# Patient Record
Sex: Female | Born: 1950
Health system: Southern US, Community
[De-identification: ages and names within clinical notes are randomized; demographics above are authoritative.]

## PROBLEM LIST (undated history)

## (undated) DIAGNOSIS — I472 Ventricular tachycardia, unspecified: Secondary | ICD-10-CM

## (undated) DIAGNOSIS — I4821 Permanent atrial fibrillation: Secondary | ICD-10-CM

## (undated) DIAGNOSIS — I639 Cerebral infarction, unspecified: Secondary | ICD-10-CM

## (undated) DIAGNOSIS — I340 Nonrheumatic mitral (valve) insufficiency: Secondary | ICD-10-CM

## (undated) DIAGNOSIS — I509 Heart failure, unspecified: Secondary | ICD-10-CM

## (undated) DIAGNOSIS — M5136 Other intervertebral disc degeneration, lumbar region: Secondary | ICD-10-CM

## (undated) DIAGNOSIS — I5022 Chronic systolic (congestive) heart failure: Secondary | ICD-10-CM

## (undated) DIAGNOSIS — N183 Chronic kidney disease, stage 3 unspecified: Secondary | ICD-10-CM

## (undated) DIAGNOSIS — D219 Benign neoplasm of connective and other soft tissue, unspecified: Secondary | ICD-10-CM

## (undated) DIAGNOSIS — Z8669 Personal history of other diseases of the nervous system and sense organs: Secondary | ICD-10-CM

## (undated) DIAGNOSIS — M5412 Radiculopathy, cervical region: Secondary | ICD-10-CM

## (undated) DIAGNOSIS — M51369 Other intervertebral disc degeneration, lumbar region without mention of lumbar back pain or lower extremity pain: Secondary | ICD-10-CM

## (undated) DIAGNOSIS — IMO0002 Reserved for concepts with insufficient information to code with codable children: Secondary | ICD-10-CM

## (undated) DIAGNOSIS — R42 Dizziness and giddiness: Secondary | ICD-10-CM

## (undated) DIAGNOSIS — H9190 Unspecified hearing loss, unspecified ear: Secondary | ICD-10-CM

## (undated) DIAGNOSIS — D126 Benign neoplasm of colon, unspecified: Secondary | ICD-10-CM

## (undated) DIAGNOSIS — M5030 Other cervical disc degeneration, unspecified cervical region: Secondary | ICD-10-CM

## (undated) DIAGNOSIS — G35 Multiple sclerosis: Secondary | ICD-10-CM

## (undated) DIAGNOSIS — I428 Other cardiomyopathies: Secondary | ICD-10-CM

## (undated) DIAGNOSIS — K219 Gastro-esophageal reflux disease without esophagitis: Secondary | ICD-10-CM

## (undated) HISTORY — PX: HYSTEROSCOPY: SHX211

## (undated) HISTORY — DX: Cerebral infarction, unspecified: I63.9

## (undated) HISTORY — DX: Other cardiomyopathies: I42.8

## (undated) HISTORY — DX: Ventricular tachycardia, unspecified: I47.20

## (undated) HISTORY — DX: Other intervertebral disc degeneration, lumbar region: M51.36

## (undated) HISTORY — DX: Gastro-esophageal reflux disease without esophagitis: K21.9

## (undated) HISTORY — DX: Multiple sclerosis: G35

## (undated) HISTORY — PX: TUBAL LIGATION: SHX77

## (undated) HISTORY — DX: Ventricular tachycardia: I47.2

## (undated) HISTORY — PX: PACEMAKER INSERTION: SHX728

## (undated) HISTORY — DX: Permanent atrial fibrillation: I48.21

## (undated) HISTORY — DX: Unspecified hearing loss, unspecified ear: H91.90

## (undated) HISTORY — DX: Benign neoplasm of connective and other soft tissue, unspecified: D21.9

## (undated) HISTORY — DX: Nonrheumatic mitral (valve) insufficiency: I34.0

## (undated) HISTORY — DX: Benign neoplasm of colon, unspecified: D12.6

## (undated) HISTORY — DX: Chronic kidney disease, stage 3 unspecified: N18.30

## (undated) HISTORY — DX: Other intervertebral disc degeneration, lumbar region without mention of lumbar back pain or lower extremity pain: M51.369

## (undated) HISTORY — DX: Other cervical disc degeneration, unspecified cervical region: M50.30

## (undated) HISTORY — DX: Dizziness and giddiness: R42

## (undated) HISTORY — DX: Personal history of other diseases of the nervous system and sense organs: Z86.69

## (undated) HISTORY — DX: Reserved for concepts with insufficient information to code with codable children: IMO0002

## (undated) HISTORY — DX: Chronic systolic (congestive) heart failure: I50.22

## (undated) HISTORY — PX: DILATION AND CURETTAGE OF UTERUS: SHX78

## (undated) HISTORY — DX: Radiculopathy, cervical region: M54.12

## (undated) HISTORY — DX: Chronic kidney disease, stage 3 (moderate): N18.3

---

## 1982-08-15 DIAGNOSIS — G35 Multiple sclerosis: Secondary | ICD-10-CM

## 1982-08-15 HISTORY — DX: Multiple sclerosis: G35

## 2000-08-03 ENCOUNTER — Other Ambulatory Visit: Admission: RE | Admit: 2000-08-03 | Discharge: 2000-08-03 | Payer: Self-pay | Admitting: Obstetrics and Gynecology

## 2001-08-20 ENCOUNTER — Inpatient Hospital Stay (HOSPITAL_COMMUNITY): Admission: EM | Admit: 2001-08-20 | Discharge: 2001-08-22 | Payer: Self-pay | Admitting: Emergency Medicine

## 2001-08-22 ENCOUNTER — Encounter: Payer: Self-pay | Admitting: Cardiology

## 2001-08-27 ENCOUNTER — Encounter: Payer: Self-pay | Admitting: Cardiology

## 2001-08-27 ENCOUNTER — Inpatient Hospital Stay (HOSPITAL_COMMUNITY): Admission: AD | Admit: 2001-08-27 | Discharge: 2001-08-28 | Payer: Self-pay | Admitting: Cardiology

## 2001-08-28 ENCOUNTER — Encounter: Payer: Self-pay | Admitting: Cardiology

## 2003-07-16 ENCOUNTER — Other Ambulatory Visit: Admission: RE | Admit: 2003-07-16 | Discharge: 2003-07-16 | Payer: Self-pay | Admitting: Obstetrics and Gynecology

## 2003-08-13 ENCOUNTER — Inpatient Hospital Stay (HOSPITAL_COMMUNITY): Admission: EM | Admit: 2003-08-13 | Discharge: 2003-08-21 | Payer: Self-pay | Admitting: Emergency Medicine

## 2003-08-14 ENCOUNTER — Encounter (INDEPENDENT_AMBULATORY_CARE_PROVIDER_SITE_OTHER): Payer: Self-pay | Admitting: Interventional Cardiology

## 2003-08-26 ENCOUNTER — Encounter: Admission: RE | Admit: 2003-08-26 | Discharge: 2003-10-23 | Payer: Self-pay | Admitting: Neurology

## 2004-08-04 ENCOUNTER — Ambulatory Visit (HOSPITAL_COMMUNITY): Admission: RE | Admit: 2004-08-04 | Discharge: 2004-08-04 | Payer: Self-pay | Admitting: Gastroenterology

## 2004-08-04 ENCOUNTER — Encounter (INDEPENDENT_AMBULATORY_CARE_PROVIDER_SITE_OTHER): Payer: Self-pay | Admitting: *Deleted

## 2004-09-16 ENCOUNTER — Encounter: Admission: RE | Admit: 2004-09-16 | Discharge: 2004-09-16 | Payer: Self-pay | Admitting: Internal Medicine

## 2005-05-17 ENCOUNTER — Other Ambulatory Visit: Admission: RE | Admit: 2005-05-17 | Discharge: 2005-05-17 | Payer: Self-pay | Admitting: Obstetrics and Gynecology

## 2005-07-04 ENCOUNTER — Emergency Department (HOSPITAL_COMMUNITY): Admission: EM | Admit: 2005-07-04 | Discharge: 2005-07-05 | Payer: Self-pay | Admitting: *Deleted

## 2007-05-08 ENCOUNTER — Other Ambulatory Visit: Admission: RE | Admit: 2007-05-08 | Discharge: 2007-05-08 | Payer: Self-pay | Admitting: Obstetrics and Gynecology

## 2007-10-23 ENCOUNTER — Ambulatory Visit (HOSPITAL_COMMUNITY): Admission: RE | Admit: 2007-10-23 | Discharge: 2007-10-23 | Payer: Self-pay | Admitting: Cardiology

## 2007-10-25 ENCOUNTER — Ambulatory Visit (HOSPITAL_COMMUNITY): Admission: RE | Admit: 2007-10-25 | Discharge: 2007-10-25 | Payer: Self-pay | Admitting: Cardiology

## 2008-01-08 ENCOUNTER — Ambulatory Visit (HOSPITAL_COMMUNITY): Admission: RE | Admit: 2008-01-08 | Discharge: 2008-01-08 | Payer: Self-pay | Admitting: Cardiology

## 2008-06-18 ENCOUNTER — Inpatient Hospital Stay (HOSPITAL_COMMUNITY): Admission: RE | Admit: 2008-06-18 | Discharge: 2008-06-19 | Payer: Self-pay | Admitting: Cardiology

## 2008-11-11 ENCOUNTER — Ambulatory Visit: Payer: Self-pay | Admitting: Obstetrics and Gynecology

## 2008-11-11 ENCOUNTER — Encounter: Payer: Self-pay | Admitting: Obstetrics and Gynecology

## 2008-11-11 ENCOUNTER — Other Ambulatory Visit: Admission: RE | Admit: 2008-11-11 | Discharge: 2008-11-11 | Payer: Self-pay | Admitting: Obstetrics and Gynecology

## 2009-07-06 ENCOUNTER — Ambulatory Visit: Payer: Self-pay | Admitting: Internal Medicine

## 2009-07-07 ENCOUNTER — Encounter (INDEPENDENT_AMBULATORY_CARE_PROVIDER_SITE_OTHER): Payer: Self-pay | Admitting: Internal Medicine

## 2009-07-07 ENCOUNTER — Inpatient Hospital Stay (HOSPITAL_COMMUNITY): Admission: EM | Admit: 2009-07-07 | Discharge: 2009-07-08 | Payer: Self-pay | Admitting: Emergency Medicine

## 2009-07-07 ENCOUNTER — Ambulatory Visit: Payer: Self-pay | Admitting: Cardiology

## 2009-07-07 ENCOUNTER — Ambulatory Visit: Payer: Self-pay | Admitting: Vascular Surgery

## 2009-12-22 ENCOUNTER — Encounter: Payer: Self-pay | Admitting: Internal Medicine

## 2009-12-28 ENCOUNTER — Ambulatory Visit (HOSPITAL_COMMUNITY): Admission: RE | Admit: 2009-12-28 | Discharge: 2009-12-28 | Payer: Self-pay | Admitting: Cardiology

## 2009-12-30 ENCOUNTER — Encounter: Payer: Self-pay | Admitting: Internal Medicine

## 2010-01-12 DIAGNOSIS — G35 Multiple sclerosis: Secondary | ICD-10-CM | POA: Insufficient documentation

## 2010-01-12 DIAGNOSIS — I10 Essential (primary) hypertension: Secondary | ICD-10-CM | POA: Insufficient documentation

## 2010-01-12 DIAGNOSIS — I428 Other cardiomyopathies: Secondary | ICD-10-CM | POA: Insufficient documentation

## 2010-01-12 DIAGNOSIS — Z950 Presence of cardiac pacemaker: Secondary | ICD-10-CM | POA: Insufficient documentation

## 2010-01-12 DIAGNOSIS — I4891 Unspecified atrial fibrillation: Secondary | ICD-10-CM | POA: Insufficient documentation

## 2010-01-12 DIAGNOSIS — I4892 Unspecified atrial flutter: Secondary | ICD-10-CM | POA: Insufficient documentation

## 2010-01-12 DIAGNOSIS — I442 Atrioventricular block, complete: Secondary | ICD-10-CM | POA: Insufficient documentation

## 2010-01-13 ENCOUNTER — Ambulatory Visit: Payer: Self-pay | Admitting: Internal Medicine

## 2010-02-04 ENCOUNTER — Encounter: Payer: Self-pay | Admitting: Internal Medicine

## 2010-02-12 DIAGNOSIS — H9190 Unspecified hearing loss, unspecified ear: Secondary | ICD-10-CM

## 2010-02-12 DIAGNOSIS — R42 Dizziness and giddiness: Secondary | ICD-10-CM

## 2010-02-12 HISTORY — DX: Dizziness and giddiness: R42

## 2010-02-12 HISTORY — DX: Unspecified hearing loss, unspecified ear: H91.90

## 2010-02-18 ENCOUNTER — Ambulatory Visit: Payer: Self-pay | Admitting: Internal Medicine

## 2010-04-09 ENCOUNTER — Encounter: Admission: RE | Admit: 2010-04-09 | Discharge: 2010-04-09 | Payer: Self-pay | Admitting: Otolaryngology

## 2010-04-26 ENCOUNTER — Encounter (INDEPENDENT_AMBULATORY_CARE_PROVIDER_SITE_OTHER): Payer: Self-pay | Admitting: Cardiology

## 2010-04-26 ENCOUNTER — Ambulatory Visit (HOSPITAL_COMMUNITY): Admission: RE | Admit: 2010-04-26 | Discharge: 2010-04-26 | Payer: Self-pay | Admitting: Cardiology

## 2010-09-14 ENCOUNTER — Encounter: Payer: Self-pay | Admitting: Internal Medicine

## 2010-09-14 NOTE — Letter (Signed)
Summary: [facility]   [facility] Visit   Imported By: Sallee Provencal 02/11/2010 09:24:34  _____________________________________________________________________  External Attachment:    Type:   Image     Comment:   External Document

## 2010-09-14 NOTE — Letter (Signed)
Summary: [facility]   Imported By: Ranell Patrick 02/17/2010 09:56:49  _____________________________________________________________________  External Attachment:    Type:   Image     Comment:   External Document

## 2010-09-14 NOTE — Assessment & Plan Note (Signed)
Summary: appt @ 12:30/eval for afib ablation/jml   Visit Type:  Initial Consult Primary Provider:  Reginia Forts, MD   History of Present Illness: Ms Kristi Parker is a pleasant 60 yo AAF with a h/o atrial flutter and heart block s/p PPM who presents today for EP consultation regarding atrial flutter.  She reports having a stroke 2004.  She reports being told that her atrial flutter was "from her heart".  She reports having both atrial fibrillation and atrial flutter.  She has had 3 cardioversions.  Her most recent cardioversion was 12/28/09.  She reports returning to atrial flutter within 24 hours after cardioversion despite medical therapy with amiodarone.   She reports symptoms of chest heaviness, dizziness, and palpitations.  She reports dyspnea with moderate activity and also fatigue.    Current Medications (verified): 1)  Vytorin 10-20 Mg Tabs (Ezetimibe-Simvastatin) .... Take One Tablet By Mouth Daily At Bedtime 2)  Amiodarone Hcl 200 Mg Tabs (Amiodarone Hcl) .... Take One Tablet By Mouth Once Daily. 3)  Coumadin 5 Mg Tabs (Warfarin Sodium) .... Uad 4)  Carvedilol 25 Mg Tabs (Carvedilol) .... Take One Tablet By Mouth Twice A Day 5)  Furosemide 20 Mg Tabs (Furosemide) .... Take 1/2 Tablet As Needed 6)  Calcium/vitamin D/minerals 600-200 Mg-Unit Tabs (Calcium Carbonate-Vit D-Min) .... Once Daily 7)  Cvs Allergy Relief 10 Mg Tbdp (Loratadine) .... As Needed 8)  Mucinex 600 Mg Xr12h-Tab (Guaifenesin) .... As Needed  Allergies (verified): No Known Drug Allergies  Past History:  Past Medical History: Complete heart block s/p PPM Stroke 2004 Persistent Atrial fibrillation and atrial flutter nonischemic CM (EF 40-45%) NYHA Class II/III CHF  HTN multiple sclerosis Prior seizures  Past Surgical History: s/p BiV pacemaker  Family History: Reviewed history from 01/12/2010 and no changes required.  Mother has MS and is alive at age 10.  Father died of   unknown causes.  She has one  brother at age 49 who is healthy.      Social History:  The patient lives in [place].  She is married.  She has 3   children.  She does not smoke or drink.  She does not do drugs.      Review of Systems       All systems are reviewed and negative except as listed in the HPI.   Vital Signs:  Patient profile:   60 year old female Height:      63 inches Weight:      154 pounds BMI:     27.38 Pulse rate:   64 / minute BP sitting:   116 / 80  (left arm)  Vitals Entered By: Margaretmary Bayley CMA (January 13, 2010 12:33 PM)  Physical Exam  General:  Well developed, well nourished, in no acute distress. Head:  normocephalic and atraumatic Eyes:  PERRLA/EOM intact; conjunctiva and lids normal. Mouth:  Teeth, gums and palate normal. Oral mucosa normal. Neck:  supple Lungs:  Clear bilaterally to auscultation and percussion. Heart:  RRR (Paced) Abdomen:  Bowel sounds positive; abdomen soft and non-tender without masses, organomegaly, or hernias noted. No hepatosplenomegaly. Msk:  Back normal, normal gait. Muscle strength and tone normal. Pulses:  pulses normal in all 4 extremities Extremities:  1+ BLE edema Neurologic:  Alert and oriented x 3. Skin:  Intact without lesions or rashes. Cervical Nodes:  no significant adenopathy Psych:  Normal affect.     EKG  Procedure date:  01/14/2010  Findings:      atypical  appearing atrial flutter, V paced at 65 bpm  Echocardiogram  Procedure date:  12/22/2009  Findings:      moderate LV dysfunction, EF 35-40%, moderate LA enlargement, moderate to severe TR, RV press 55-10mm Hg,  moderate MR   PPM Follow Up Remote Check?  No Battery Voltage:  2.98 V     Battery Est. Longevity:  4-6 years       PPM Device Measurements Atrium  Amplitude: 2 mV, Impedance: 387 ohms,  Right Ventricle  Impedance: 396 ohms, Threshold: 1 V at 0.4 msec Left Ventricle  Impedance: 444 ohms, Threshold: 1.5 V at .6 msec  Episodes Coumadin:  Yes Ventricular  Pacing:  100%  Parameters Mode:  DDDR     Lower Rate Limit:  60     Upper Rate Limit:  130 MD Comments:  Atrial flutter with CHB and no underlying R waves today.  Fluter waves were falling into refractoriness and not sensing.  Thus, pt was not appropriately mode switching and was tracking at 130 bpm at times.  We decreased PVAB from 150 to 169ms and decresaed mode switch rate from 150 to 140 bpm to eliminate this and allow for adequate mode switching today.  Impression & Recommendations:  Problem # 1:  ATRIAL FLUTTER (ICD-427.32) The patient presents today for EP consultation regarding symtomatic afib and atrial flutter.  Her atrial flutter appears atypical by ekg.  Her symptoms are predominantly "heart racing" and palpitations, though she has CHB.  I have reprogrammed her pacemaker as above to promote appropriate mode switching.  I suspect that this will significantly improve her symptoms.  I am concerned that given her severe TR, RV pressure overload, moderate MR, and reduced EF that she will continue to have difficulties with atrial arrhythmias.  I do not feel that she is a candidate for catheter ablation for afib.  I am not sure that she will benefit from atrial flutter ablation. I have instructed the patient to return in 4 weeks to assess improvement with pacemaker changes made today.  If she is doing well at that time, we should stop amiodarone and follow a rate control strategy longterm. She understands the need for chronic coumadin for stroke prevention. She will need to have her cardiomyopathy/ valvular disease followed closely by Dr Leonia Reeves.  Problem # 2:  PACEMAKER, PERMANENT (ICD-V45.01) reprogrammed as above  Problem # 3:  CARDIOMYOPATHY (ICD-425.4) she will need close follow-up with Dr Leonia Reeves  Patient Instructions: 1)  Your physician recommends that you schedule a follow-up appointment in: 4 weeks

## 2010-09-14 NOTE — Cardiovascular Report (Signed)
Summary: Office Visit  Office Visit   Imported By: Roddie Mc 01/26/2010 16:22:43  _____________________________________________________________________  External Attachment:    Type:   Image     Comment:   External Document

## 2010-09-14 NOTE — Assessment & Plan Note (Signed)
Summary: ROV/SL;   Visit Type:  Follow-up Referring Provider:  Dr Kristi Parker Primary Provider:  Reginia Forts, MD  CC:   .  History of Present Illness: Kristi Parker presents today for EP follow-up.   She reports that episodes of "heart racing"have significantly improved since prior visit to our office with pacemaker changes made at that time.  She continues to have dypsnea and exertional fatigue with moderate activity.  She denies CP, presyncope, or syncope.  Current Medications (verified): 1)  Vytorin 10-20 Mg Tabs (Ezetimibe-Simvastatin) .... Take One Tablet By Mouth Daily At Bedtime 2)  Amiodarone Hcl 200 Mg Tabs (Amiodarone Hcl) .... Take One Tablet By Mouth Once Daily. 3)  Coumadin 5 Mg Tabs (Warfarin Sodium) .... Uad 4)  Carvedilol 25 Mg Tabs (Carvedilol) .... Take One Tablet By Mouth Twice A Day 5)  Furosemide 20 Mg Tabs (Furosemide) .... Take 1/2 Tablet As Needed 6)  Calcium/vitamin D/minerals 600-200 Mg-Unit Tabs (Calcium Carbonate-Vit D-Min) .... Once Daily 7)  Cvs Allergy Relief 10 Mg Tbdp (Loratadine) .... As Needed 8)  Mucinex 600 Mg Xr12h-Tab (Guaifenesin) .... As Needed  Allergies (verified): No Known Drug Allergies  Past History:  Past Medical History: Complete heart block s/p PPM Stroke 2004 Persistent Atrial fibrillation and atrial flutter nonischemic CM (EF 40-45%) NYHA Class II/III CHF Moderate MR with LA enlargement, elevated RV pressure, and severe TR HTN multiple sclerosis Prior seizures  Past Surgical History: Reviewed history from 01/13/2010 and no changes required. s/p BiV pacemaker  Social History: Reviewed history from 01/13/2010 and no changes required.  The patient lives in [place].  She is married.  She has 3   children.  She does not smoke or drink.  She does not do drugs.      Review of Systems       All systems are reviewed and negative except as listed in the HPI.   Vital Signs:  Patient profile:   60 year old female Height:       63 inches Weight:      152.25 pounds BMI:     27.07 Pulse rate:   62 / minute Pulse rhythm:   regular Resp:     18 per minute BP sitting:   120 / 84  (left arm) Cuff size:   large  Vitals Entered By: Kristi Parker (February 18, 2010 11:48 AM)  Physical Exam  General:  Well developed, well nourished, in no acute distress. Head:  normocephalic and atraumatic Eyes:  PERRLA/EOM intact; conjunctiva and lids normal. Mouth:  Teeth, gums and palate normal. Oral mucosa normal. Neck:  supple Lungs:  Clear bilaterally to auscultation and percussion. Heart:  RRR (Paced) Abdomen:  Bowel sounds positive; abdomen soft and non-tender without masses, organomegaly, or hernias noted. No hepatosplenomegaly. Msk:  Back normal, normal gait. Muscle strength and tone normal. Pulses:  pulses normal in all 4 extremities Extremities:  1+ BLE edema Neurologic:  Alert and oriented x 3. Skin:  Intact without lesions or rashes. Cervical Nodes:  no significant adenopathy Psych:  Normal affect.      EKG  Procedure date:  02/18/2010  Findings:      atypical left atrial flutter,  BiV paced (60 bpm)  Echocardiogram  Procedure date:  12/22/2009  Findings:      EF 35-40%,  moderate LA enlargement with interatrial septal bowing suggesting elevated LA pressure, moderate MR, moderate to severe TR, RV pressure 55-60%,   PPM Specifications Following MD:  Kristi Grayer, MD  PPM Vendor:  Medtronic     PPM Model Number:  [id number]     PPM Serial Number:  [id number] PPM DOI:  06/18/2008     PPM Implanting MD:  NOT IMPLANTED BY [place]  Lead 1    Location: RA     DOI: 06/18/2008     Model #: [id number]     Serial #: [id number]     Status: active Lead 2    Location: RV     [id number]     Model #: 08/21/2001     Serial #: [id number]     Status: active  Magnet Response Rate:  BOL 85 ERI 65  Indications:  CM   PPM Follow Up Remote Check?  No Battery Voltage:  2.987 V     Battery Est. Longevity:  4.5 YEARS     Pacer Dependent:   No      Episodes Kristi Episodes:  89     Percent Mode Switch:  100%     Coumadin:  Yes Ventricular High Rate:  0     Atrial Pacing:  0.2%     Ventricular Pacing:  99.8%  Parameters Mode:  DDDR     Lower Rate Limit:  60     Upper Rate Limit:  130 Paced AV Delay:  150     Sensed AV Delay:  120 Tech Comments:  Interrogation only.  Ventricular rates during A-fib controlled, + coumadin.   Kristi Friendly, LPN  July  7, [id number] PM  MD Comments:  agree  Impression & Recommendations:  Problem # 1:  ATRIAL FIBRILLATION (ICD-427.31) The patient has permanent atrial arrhythmias, likely a results to her significantly elevated atrial pressure, atrial enlargement, and valvular heart disease.   Her heart rates are well controlled and she is appropriately anticoagulated with coumadin.  Unfortunately, she has not maintained sinus rhythm despite medical therapy with amiodarone.  I do not feel that she is a candidate for catheter ablation of atrial fibrillation or atypical atrial flutter.  I therefore recommend that we stop amiodarone today and continue rate control/ anticoagulation longterm.  I am concerned that her progressive symptoms of fatigue and decreased exercise tolerance are due to her cardiomyopathy.  She has depressed LV function with at least moderate MR, elevated LA/RV pressures, and moderate to severe TR.  I think that her best option would be aggressive medical therapy for her cardiomyopathy.  I will defer further workup to Dr Kristi Parker regarding her cardiomyopathy.  As I am not sure as to the cause for her cardiomyopathy, I do not know if further evaluation of her MR (TEE) would be beneficial.  I doubt that she would be a surgical candidate given her depressed LV function, but will defer further evaluation to Dr Kristi Parker.  Problem # 2:  AV BLOCK, COMPLETE (ICD-426.0) Normal BiV pacemaker function Her rates are controlled and she is biv pacing 100% of the time.  No changes today  Problem # 3:   CARDIOMYOPATHY (ICD-425.4) as above she has acute on chronic combined CHF.  I have recommended daily weights and salt restriction further medical management per Dr Kristi Parker.  Other Orders: EKG w/ Interpretation (93000)  Patient Instructions: 1)  Your physician recommends that you schedule a follow-up appointment in: as needed with Dr. Rayann Heman 2)  Your physician has recommended you make the following change in your medication: STOP Amiodarone 3)  Your physician has requested that you limit the intake of sodium (salt) in your diet to two  grams daily. Please see [facility] handout. 4)  Your physician recommends that you weigh, daily, at the same time every day, and in the same amount of clothing.  Please record your daily weights on the handout provided and bring it to your next appointment. 5)  follow-up with Dr. Leonia Parker

## 2010-09-22 NOTE — Miscellaneous (Signed)
Summary: corrected device information  Clinical Lists Changes  Observations: Added new observation of PPMLEADMOD2: [id number]  (09/14/2010 19:41) Added new observation of PPMLEADDOI2: 08/21/2001  (09/14/2010 19:41)      PPM Specifications Following MD:  Thompson Grayer, MD     PPM Vendor:  Medtronic     PPM Model Number:  [id number]     PPM Serial Number:  [id number] PPM DOI:  06/18/2008     PPM Implanting MD:  NOT IMPLANTED BY [place]  Lead 1    Location: RA     DOI: 06/18/2008     Model #: [id number]     Serial #: [id number]     Status: active Lead 2    Location: RV     DOI: 08/21/2001     Model #: [id number]     Serial #: [id number]     Status: active  Magnet Response Rate:  BOL 85 ERI 65  Indications:  CM   PPM Follow Up Pacer Dependent:  No      Episodes Coumadin:  Yes  Parameters Mode:  DDDR     Lower Rate Limit:  60     Upper Rate Limit:  130 Paced AV Delay:  150     Sensed AV Delay:  120

## 2010-11-17 LAB — COMPREHENSIVE METABOLIC PANEL
ALT: 43 U/L — ABNORMAL HIGH (ref 0–35)
AST: 34 U/L (ref 0–37)
Albumin: 3.4 g/dL — ABNORMAL LOW (ref 3.5–5.2)
Alkaline Phosphatase: 91 U/L (ref 39–117)
BUN: 14 mg/dL (ref 6–23)
CO2: 26 mEq/L (ref 19–32)
Calcium: 9 mg/dL (ref 8.4–10.5)
Chloride: 112 mEq/L (ref 96–112)
Creatinine, Ser: 1.47 mg/dL — ABNORMAL HIGH (ref 0.4–1.2)
GFR calc Af Amer: 44 mL/min — ABNORMAL LOW (ref >60)
GFR calc non Af Amer: 37 mL/min — ABNORMAL LOW (ref >60)
Glucose, Bld: 128 mg/dL — ABNORMAL HIGH (ref 70–99)
Potassium: 4 mEq/L (ref 3.5–5.1)
Sodium: 142 mEq/L (ref 135–145)
Total Bilirubin: 1.4 mg/dL — ABNORMAL HIGH (ref 0.3–1.2)
Total Protein: 6.4 g/dL (ref 6.0–8.3)

## 2010-11-17 LAB — CARDIAC PANEL(CRET KIN+CKTOT+MB+TROPI)
CK, MB: 1.7 ng/mL (ref 0.3–4.0)
CK, MB: 1.7 ng/mL (ref 0.3–4.0)
CK, MB: 1.9 ng/mL (ref 0.3–4.0)
CK, MB: 1.9 ng/mL (ref 0.3–4.0)
Relative Index: 1.2 (ref 0.0–2.5)
Relative Index: 1.2 (ref 0.0–2.5)
Relative Index: 1.3 (ref 0.0–2.5)
Relative Index: 1.4 (ref 0.0–2.5)
Total CK: 135 U/L (ref 7–177)
Total CK: 137 U/L (ref 7–177)
Total CK: 138 U/L (ref 7–177)
Total CK: 142 U/L (ref 7–177)
Troponin I: 0.02 ng/mL (ref 0.00–0.06)
Troponin I: 0.03 ng/mL (ref 0.00–0.06)
Troponin I: 0.03 ng/mL (ref 0.00–0.06)
Troponin I: 0.07 ng/mL — ABNORMAL HIGH (ref 0.00–0.06)

## 2010-11-17 LAB — CBC
HCT: 29.9 % — ABNORMAL LOW (ref 36.0–46.0)
HCT: 30.9 % — ABNORMAL LOW (ref 36.0–46.0)
HCT: 30.9 % — ABNORMAL LOW (ref 36.0–46.0)
HCT: 33.3 % — ABNORMAL LOW (ref 36.0–46.0)
Hemoglobin: 10.3 g/dL — ABNORMAL LOW (ref 12.0–15.0)
Hemoglobin: 10.4 g/dL — ABNORMAL LOW (ref 12.0–15.0)
Hemoglobin: 11.3 g/dL — ABNORMAL LOW (ref 12.0–15.0)
Hemoglobin: 9.9 g/dL — ABNORMAL LOW (ref 12.0–15.0)
MCHC: 33.2 g/dL (ref 30.0–36.0)
MCHC: 33.3 g/dL (ref 30.0–36.0)
MCHC: 33.8 g/dL (ref 30.0–36.0)
MCHC: 33.9 g/dL (ref 30.0–36.0)
MCV: 89.2 fL (ref 78.0–100.0)
MCV: 89.6 fL (ref 78.0–100.0)
MCV: 90.1 fL (ref 78.0–100.0)
MCV: 90.4 fL (ref 78.0–100.0)
Platelets: 119 10*3/uL — ABNORMAL LOW (ref 150–400)
Platelets: 127 10*3/uL — ABNORMAL LOW (ref 150–400)
Platelets: 128 10*3/uL — ABNORMAL LOW (ref 150–400)
Platelets: 130 10*3/uL — ABNORMAL LOW (ref 150–400)
RBC: 3.31 MIL/uL — ABNORMAL LOW (ref 3.87–5.11)
RBC: 3.43 MIL/uL — ABNORMAL LOW (ref 3.87–5.11)
RBC: 3.45 MIL/uL — ABNORMAL LOW (ref 3.87–5.11)
RBC: 3.73 MIL/uL — ABNORMAL LOW (ref 3.87–5.11)
RDW: 15.6 % — ABNORMAL HIGH (ref 11.5–15.5)
RDW: 15.7 % — ABNORMAL HIGH (ref 11.5–15.5)
RDW: 16 % — ABNORMAL HIGH (ref 11.5–15.5)
RDW: 16.1 % — ABNORMAL HIGH (ref 11.5–15.5)
WBC: 4.8 10*3/uL (ref 4.0–10.5)
WBC: 5 10*3/uL (ref 4.0–10.5)
WBC: 5.9 10*3/uL (ref 4.0–10.5)
WBC: 6.4 10*3/uL (ref 4.0–10.5)

## 2010-11-17 LAB — URINALYSIS, ROUTINE W REFLEX MICROSCOPIC
Bilirubin Urine: NEGATIVE
Glucose, UA: NEGATIVE mg/dL
Hgb urine dipstick: NEGATIVE
Ketones, ur: NEGATIVE mg/dL
Nitrite: NEGATIVE
Protein, ur: NEGATIVE mg/dL
Specific Gravity, Urine: 1.014 (ref 1.005–1.030)
Urobilinogen, UA: 0.2 mg/dL (ref 0.0–1.0)
pH: 5.5 (ref 5.0–8.0)

## 2010-11-17 LAB — BASIC METABOLIC PANEL
BUN: 11 mg/dL (ref 6–23)
CO2: 27 mEq/L (ref 19–32)
Calcium: 8.6 mg/dL (ref 8.4–10.5)
Chloride: 106 mEq/L (ref 96–112)
Creatinine, Ser: 1.56 mg/dL — ABNORMAL HIGH (ref 0.4–1.2)
GFR calc Af Amer: 41 mL/min — ABNORMAL LOW (ref >60)
GFR calc non Af Amer: 34 mL/min — ABNORMAL LOW (ref >60)
Glucose, Bld: 101 mg/dL — ABNORMAL HIGH (ref 70–99)
Potassium: 3.6 mEq/L (ref 3.5–5.1)
Sodium: 138 mEq/L (ref 135–145)

## 2010-11-17 LAB — PROTIME-INR
INR: 2.35 — ABNORMAL HIGH (ref 0.00–1.49)
INR: 2.43 — ABNORMAL HIGH (ref 0.00–1.49)
Prothrombin Time: 25.5 seconds — ABNORMAL HIGH (ref 11.6–15.2)
Prothrombin Time: 26.2 seconds — ABNORMAL HIGH (ref 11.6–15.2)

## 2010-11-17 LAB — DIFFERENTIAL
Basophils Absolute: 0 10*3/uL (ref 0.0–0.1)
Basophils Relative: 0 % (ref 0–1)
Eosinophils Absolute: 0 10*3/uL (ref 0.0–0.7)
Eosinophils Relative: 0 % (ref 0–5)
Lymphocytes Relative: 16 % (ref 12–46)
Lymphs Abs: 1 10*3/uL (ref 0.7–4.0)
Monocytes Absolute: 0.6 10*3/uL (ref 0.1–1.0)
Monocytes Relative: 9 % (ref 3–12)
Neutro Abs: 4.7 10*3/uL (ref 1.7–7.7)
Neutrophils Relative %: 75 % (ref 43–77)

## 2010-11-17 LAB — POCT CARDIAC MARKERS
CKMB, poc: 2.4 ng/mL (ref 1.0–8.0)
Myoglobin, poc: 121 ng/mL (ref 12–200)
Troponin i, poc: <0.05 ng/mL (ref 0.00–0.09)

## 2010-11-17 LAB — URINE MICROSCOPIC-ADD ON

## 2010-11-17 LAB — CK TOTAL AND CKMB (NOT AT ARMC)
CK, MB: 2.6 ng/mL (ref 0.3–4.0)
Relative Index: 1.6 (ref 0.0–2.5)
Total CK: 165 U/L (ref 7–177)

## 2010-11-17 LAB — TSH: TSH: 0.913 u[IU]/mL (ref 0.350–4.500)

## 2010-11-17 LAB — D-DIMER, QUANTITATIVE: D-Dimer, Quant: <0.22 ug/mL-FEU (ref 0.00–0.48)

## 2010-11-17 LAB — POTASSIUM: Potassium: 4.2 mEq/L (ref 3.5–5.1)

## 2010-11-17 LAB — TROPONIN I: Troponin I: 0.12 ng/mL — ABNORMAL HIGH (ref 0.00–0.06)

## 2010-12-14 ENCOUNTER — Other Ambulatory Visit: Payer: Self-pay | Admitting: Internal Medicine

## 2010-12-14 DIAGNOSIS — N183 Chronic kidney disease, stage 3 unspecified: Secondary | ICD-10-CM

## 2010-12-17 ENCOUNTER — Ambulatory Visit
Admission: RE | Admit: 2010-12-17 | Discharge: 2010-12-17 | Disposition: A | Discharge status: Home / Self Care | Payer: BC Managed Care – PPO | Source: Ambulatory Visit | Attending: Internal Medicine | Admitting: Internal Medicine

## 2010-12-17 DIAGNOSIS — N183 Chronic kidney disease, stage 3 unspecified: Secondary | ICD-10-CM

## 2010-12-28 NOTE — Op Note (Signed)
NAMEJAHNIA, HOLLIDAY             ACCOUNT NO.:  [id number]   MEDICAL RECORD NO.:  [id number]          PATIENT TYPE:  OIB   LOCATION:  [id number]                         FACILITY:  [facility]   PHYSICIAN:  Barnett Abu, M.D.  DATE OF BIRTH:  February 25, 1951   DATE OF PROCEDURE:  01/08/2008  DATE OF DISCHARGE:                               OPERATIVE REPORT   PROCEDURES PERFORMED:  1. Direct current cardioversion.  2. Dual-chamber pacemaker reprogramming.   INDICATION:  Kristi Parker is a 60 year old woman with a long history  of cardiomyopathy and pacemaker insertion for a heart block.  She has  recently developed atrial tachy arrhythmia in the form of atrial  flutter.  She underwent cardioversion approximately 6 weeks ago for this  after a loading dose of amiodarone but did not maintain sinus rhythm.  She had an increase in her daily amiodarone dose for 4 weeks and a  repeat amiodarone level has been in a more therapeutic range.  Her INR  today is 4.1.  She has returned to the hospital for a repeat attempt at  cardioversion in order to maintain sinus rhythm and allow for AV  synchrony in the setting of moderately depressed LV systolic function  and EF of 30%-35% range.   PROCEDURE NOTE:  While monitoring heart rate, blood pressure, O2  saturation, and ECG under the direct supervision of Dr. Oletta Lamas of the  anesthesia department, the patient received 125 mg of IV Pentothal.  After establishing deep anesthesia, a single transthoracic dose of 150  joules biphasic synchronized energy was applied and this resulted in  prompt discontinuation of atrial flutter.  No sinus activity was  detected at a paced rate of 40 beats per minute.  I therefore placed her  pacemaker in a DDDR mode at base rate of 70 beats per minute.  The  counters were reset to allow for determination of the maintenance of  sinus rhythm or initiation of recurrent atrial flutter.  Atrial  sensitivity remained at 0.25 and her fall  back mode is DVIR at an atrial  rate of 170.   FINAL IMPRESSION:  1. Successful elective cardioversion atrial flutter to paced rhythm,      atrial and ventricular.  2. Moderate nonischemic cardiomyopathy.  3. Pacemaker dependency.  4. Amiodarone therapy.  5. Coumadin therapy.  The level is somewhat hypertherapeutic.   PLAN:  1. Follow up in the PT/INR clinic within 2 days.  2. Decrease amiodarone to 400 mg p.o. daily.  3. Back in the office to see me in 2-3 weeks.      Barnett Abu, M.D.  Electronically Signed    JHE/MEDQ  D:  01/08/2008  T:  01/09/2008  Job:  [id number]

## 2010-12-28 NOTE — Op Note (Signed)
Kristi Parker             ACCOUNT NO.:  [id number]   MEDICAL RECORD NO.:  [id number]          PATIENT TYPE:  INP   LOCATION:  2038                         FACILITY:  [facility]   PHYSICIAN:  Barnett Abu, M.D.  DATE OF BIRTH:  25-Sep-1950   DATE OF PROCEDURE:  06/19/2008  DATE OF DISCHARGE:  06/19/2008                               OPERATIVE REPORT   PROCEDURES PERFORMED:  1. Upgrade dual-chamber pacemaker to biventricular pacemaker.  2. Insertion/revision new atrial lead.  3. Explant old pacing generator.  4. Left subclavian venogram.  5. Coronary sinus venogram.   INDICATION:  Kristi Parker is a pleasant 60 year old woman with a  primary dilated cardiomyopathy and complete heart block who is now at  [facility] for a Guidant dual-chamber pacemaker placed in January 2003.  She  has had decreasing LV function and is chronically RV paced.  She has had  mild symptoms of heart failure, class II.  Last LVEF assessment  approximately 40% with significant dyssynergy of the septum.  She is  therefore brought to the catheterization laboratory at this time for a  pacemaker upgrade to biventricular device and battery replacement.   PROCEDURE NOTE:  The patient is brought to cardiac catheterization  laboratory in the fasting state.  The left prepectoral region was  prepped and draped in the usual sterile fashion.  Local anesthesia was  obtained with infiltration of 1% lidocaine with epinephrine throughout  the left prepectoral region.  The left subclavian venogram was then  performed with a peripheral injection of 20 mL of Omnipaque.  A digital  cineangiogram was obtained and road mapped to guide future left  subclavian puncture.  The venogram did demonstrate the vein to be widely  patent and coursing in a normal fashion over the anterior surface of the  first rib and beneath the middle third of the clavicle.  There was no  evidence for persistence of the left superior vena cava.   A 6-7  cm incision was then made over the old incision site.  This was  carried down by blunt and sharp dissection to the pacemaker capsule.  The capsule was incised and the device delivered without significant  difficulty.  The left subclavian vein was then punctured with an 18-  gauge thin wall needle through which was passed a 0.038-inch tight J  guidewire.  Over this guidewire, a 9-French tear-away sheath and dilator  were advanced.  This was a safe sheath.  Over the guidewire, a Medtronic  MB2 guiding catheter was advanced with the dilator.  At the level of the  right atrium, the dilator was removed.  Using fluoroscopic landmarks,  the guiding catheter was eventually manipulated into the coronary sinus,  which was somewhat posterior in the right atrium.  Retrograde coronary  sinus venograms were then performed in RAO and LAO projections by hand  injection.  An adequate-sized lateral vein was identified and this was  subsequently cannulated with a 0.014-inche Mailman guidewire.  Over this  guidewire, a Medtronic  Attain Ability left ventricular lead, model  number [id number], serial number [id number] was advanced.  Unfortunately,  adequate pacing parameters were not obtained.  The lead was withdrawn  and subsequently a 0.014-inch Luge intracoronary guidewire was advanced  into the posterolateral vein.  The left ventricular lead was placed in  this vein and again adequate pacing parameters were not obtained.  Finally, a Mailman guidewire was again advanced and into the lateral  subbranch of the anterior vein.  There appeared to be adequate  separation of the RV and LV leads in this position and it was an  adequate distance onto the lateral wall, after advancing the lead into  this position.  Adequate pacing parameters were obtained and this was  reported below.  The lead was tested for diaphragmatic and chest wall  pacing at 10 volts and none was found.  The guiding catheter was then  removed by the  slit technique as well as the 9-French safe sheath.  An 0  silk figure-of-eight hemostasis suture was placed and the lead was  sutured into place using 3 separate 0 silk ligatures.  The patient was  then placed on left ventricular pacing and the old dual-chamber  pacemaker device was disconnected from the leads.  Both leads were  tested for adequate pacing parameters.  However, the atrial lead was  defective.  There was no atrial sensing, it was very well.  There was no  clear capture of the atrium at 9 volts.  Therefore, the left subclavian  vein was again cannulated at this time with a micropuncture needle,  which was then upsized with a 0.038-inch guidewire over which a 7-French  tear-away sheath and dilator was advanced.  The dilator and wire were  removed and a new atrial lead was advanced to the level of the right  atrium.  After extensive right atrial mapping, adequate pacing and  sensing parameters were obtained near the low lateral wall of the right  atrium.  This was an active fixation lead and the screw was advanced as  appropriate.  It was tested for diaphragmatic pacing at 10 volts and  none was found.  The lead was then sutured into place using 3 separate 0  silk ligatures after removal of the tear-away sheath.  The right  ventricular lead was tested and adequate pacing parameters were  obtained.  The wound was then copiously irrigated using 1% gentamicin  solution.  The pacing leads were attached to the new pacing generator  carefully identifying each by its serial number and placing each into  the appropriate receptacle.  Each lead was tightened into place and  tested for security.  The leads were then warmth beneath the pacing  generator and the generator was placed in the pocket.  The pocket was  then closed using 2-0 Vicryl in a running fashion for the subcutaneous  layer.  Two layers were applied.  The skin was approximated using 4-0  Vicryl in a running subcuticular  fashion.  Steri-Strips and sterile  dressing were applied and the patient was transported to the recovery  area in an A paced and biventricular paced mode.   EQUIPMENT DATA:  The biventricular pacemaker is Medtronic InSync III,  serial number [id number].  The atrial lead is Medtronic model number  [id number], serial number [id number].  The left ventricular lead data is as  described above.   PACING DATA:  The atrial lead detected at 1.8 mV P wave.  The pacing  threshold was 0.6 volts at 0.5 milliseconds pulse width.  The impedance  was 485 ohms.  The right  ventricular lead detected at 6.0 mV R wave.  The pacing threshold was 0.7 volts at 0.5 milliseconds pulse width.  The  impedance was 399 ohms.  The left ventricular lead detected at 15.0 mV R  wave.  The pacing threshold was 3.6 volts at 0.5 milliseconds pulse  width.  The impedance was 1039 ohms.      Barnett Abu, M.D.  Electronically Signed     JHE/MEDQ  D:  06/19/2008  T:  06/20/2008  Job:  [id number]

## 2010-12-28 NOTE — Op Note (Signed)
Kristi Parker, PEAIRS             ACCOUNT NO.:  [id number]   MEDICAL RECORD NO.:  [id number]          PATIENT TYPE:  OIB   LOCATION:  2899                         FACILITY:  [facility]   PHYSICIAN:  Barnett Abu, M.D.  DATE OF BIRTH:  [birth year]   DATE OF PROCEDURE:  10/25/2007  DATE OF DISCHARGE:  10/25/2007                               OPERATIVE REPORT   PREOPERATIVE DIAGNOSIS:  Elective cardioversion.   PROCEDURE PERFORMED:  Direct current cardioversion.   INDICATION:  A 60 year old woman with known complete heart block and  cardiomyopathy has developed atrial fibrillation/flutter with controlled  ventricular response that is paced.  Because of falling LV function and  worsening exertional tolerance, she is brought to the outpatient  department for restoration of sinus rhythm.  She has been treated for  the past 4-6 weeks with amiodarone, currently at 200 mg daily, and  systemic anticoagulation.  PT/INR today is 2.6.   PROCEDURAL NOTE:  While monitoring heart rate, blood pressure and O2  saturation as well as ECG, and under the direct supervision of Dr.  Fulton Reek of the anesthesia department, the patient received 225  mg of IV Pentothal.  At establishing deep anesthesia, the patient  received a single transthoracic dose of biphasic energy synchronized 150  joules.  She had prompt discontinuation of atrial flutter.  There was  atrial asystole and AV pacing at 60 beats per minute.  Briefly AV pacing  was at 110 beats per minute, likely due to rate response on of the  device.  When this was programmed off, she promptly fell to her base  rate of 60 beats per minute.  There was no atrial activity noted even at  a pacing rate of 40 beats per minute.   FINAL IMPRESSION:  1. Successful elective cardioversion, atrial flutter to paced AV      rhythm.  2. Atrial asystole, likely secondary to prolonged atrial      fibrillation/flutter.   PLAN:  1. Reprogram the pacemaker to  70 beats per minute base rate, rate      response on, decrease sensitivity of accelerometer from 10 to 8,      and turn on MVP.  2. Continue amiodarone and warfarin.  Follow up in the PT/INR clinic      as planned.  3. Office visit with myself in 4-6 weeks to assess her level of      symptoms and recheck LV function.  Her last LVEF was in the 30-35%      range.      Barnett Abu, M.D.  Electronically Signed     JHE/MEDQ  D:  10/25/2007  T:  10/26/2007  Job:  [id number]

## 2010-12-31 NOTE — Op Note (Signed)
NAMELELAR, GRYGIEL             ACCOUNT NO.:  [id number]   MEDICAL RECORD NO.:  [id number]          PATIENT TYPE:  AMB   LOCATION:  ENDO                         [facility]AC[facility]  [facility]   PHYSICIAN:  Earle Gell, M.D.   DATE OF BIRTH:  07-Apr-1951   DATE OF PROCEDURE:  08/04/2004  DATE OF DISCHARGE:                                 OPERATIVE REPORT   PROCEDURE:  Colonoscopy with polypectomy.   INDICATIONS FOR PROCEDURE:  Ms. Kristi Parker is a 60 year old female born  1951/07/01.  Ms. [name] underwent a health maintenance flexible  proctosigmoidoscopy performed by Dr. Mertha Finders.  A polyp was discovered  at 30 cm from the anal verge.   ENDOSCOPIST:  Earle Gell, M.D.   PREMEDICATION:  Versed 5 mg, Demerol 50 mg.   DESCRIPTION OF PROCEDURE:  After obtaining informed consent, Ms. [name]  was placed in the left lateral decubitus position.  I administered  intravenous Demerol and intravenous Versed to achieve conscious sedation for  the procedure.  The patient's blood pressure, oxygen saturation, and cardiac  rhythm were monitored throughout the procedure and documented in the medical  record.   Anal inspection and digital rectal exam were normal.  The Olympus adjustable  pediatric colonoscope was introduced into the rectum and advanced to the  cecum.  The colonic preparation for the exam today was excellent.   Rectum:  Normal.   Sigmoid colon and descending colon:  From the distal descending colon at 25  cm from the anal verge, a 5-mm pedunculated polyp was removed with  electrocautery snare.  At 35 cm from the anal verge, a 2-mm sessile polyp  was removed with the electrocautery snare.  Both polyps were submitted in  one bottle for pathological evaluation.  Left colonic diverticulosis  present.   Splenic flexure:  Normal.   Transverse colon:  From the proximal transverse colon, a 2-mm sessile polyp  was removed with the electrocautery snare.   Hepatic  flexure:  Normal.   Ascending colon:  Normal.   Cecum and ileocecal valve:  Normal.   ASSESSMENT:  1.  From the proximal transverse colon, a 2-mm sessile polyp was removed.  2.  From the sigmoid colon, a 5-mm pedunculated polyp was removed and a 2-mm      sessile polyp was removed.  3.  Left colonic diverticulosis.   RECOMMENDATIONS:  I will ask Ms. [name] to restart her Coumadin on  August 10, 2004.      MJ/MEDQ  D:  08/04/2004  T:  08/04/2004  Job:  [id number]   cc:   Barnett Abu, M.D.  [address]  [place]  [place] [address]  Fax: [phone]   Dwaine Deter, M.D.  [address]  [place] [address]  Fax: [phone]

## 2010-12-31 NOTE — Procedures (Signed)
[place]. [facility]  Patient:    Kristi Parker, Kristi Parker Visit Number: [id number] MRN: [id number]          Service Type: MED Location: [id number] Attending Physician:  Irven Shelling Dictated by:   Barnett Abu, M.D. Proc. Date: 08/21/01 Admit Date:  08/20/2001   CC:         Delanna Ahmadi, M.D.  Cardiac Catheterization Lab   Procedure Report  PROCEDURES PERFORMED: 1. Insertion dual-chamber permanent transvenous pacemaker. 2. Left subclavian venogram.  INDICATIONS:  Kristi Parker is a 60 year old woman who has developed idiopathic complete heart block symptomatic for dyspnea and chest tightness. She is brought now to the catheterization laboratory for insertion of permanent dual-chamber pacemaker.  PROCEDURAL NOTE:  The patient was brought to the cardiac catheterization laboratory in the postabsorptive state.  The left prepectoral region was prepped and draped in the usual sterile fashion.  Local anesthesia was obtained with the infiltration of 1% lidocaine with epinephrine throughout.  A 6-7 cm incision was made in the deltopectoral groove.  This was carried down by sharp dissection and electrocautery to the prepectoral fascia.  There a plane was lifted and a pocket formed using electrocautery and blunt dissection.  The pocket was then packed with a 1% kanamycin-soaked gauze.  A left subclavian venogram was then performed with a peripheral injection of 20 cc of Omnipaque.  A digital cine venogram was obtained and used to provide a "road map" to guide future left subclavian puncture.  The venogram did reveal the left subclavian vein to be widely patent and coursing in a normal fashion over the anterior surface of the first rib and beneath the middle third of the clavicle.  There was no evidence for persistence of the left superior vena cava.  Two separate left subclavian punctures were performed using an 18-gauge thin-walled needle  through which was passed a 0.038-inch tight J guidewire. Over the initial guidewire, a #7 [place] tear-away sheath and dilator were advanced.  The wire and dilator were removed.  The ventricular lead was advanced to the level of the right atrium.  The sheath was torn away.  A figure-of-eight hemostasis suture was placed around the insertion site using #0 silk.  Using standard technique and fluoroscopic landmarks, the ventricular lead was then manipulated into the right ventricular apex.  There, excellent pacing parameters were obtained as will be noted below.  The lead was tested for diaphragmatic pacing at 10 volts and none was found.  The lead was then sutured into place using three separate #0 silk ligatures.  Over the remaining guidewire, an #8 [place] tear-away sheath and dilator were advanced.  The dilator was removed.  The wire was allowed to remain in place.  The atrial lead was advanced to the level of the right atrium.  The sheath was torn away. Again using standard technique and fluoroscopic landmarks, the lead was manipulated into the right atrial appendage.  There, excellent pacing parameters were obtained as will be noted below.  The lead was tested for diaphragmatic pacing at 10 volts and none was found.  The lead was then sutured into place using three separate #0 silk ligatures.  The remaining guidewire was removed as well as a kanamycin-soaked gauze which had previously been placed in the pocket.  The pocket was copiously irrigated using a 1% kanamycin solution.  The leads were then attached to the pacing generator, carefully identifying each by its serial number and inserting into the appropriate  lead socket.  These were carefully tightened into place.  The leads were then wound beneath the pacing generator and the pacing generator placed in the pocket.  A #0 silk suture was placed to attach the generator to the pectoralis muscle.  The pocket was then inspected for bleeding  and none was found.  The pocket was then closed using 2-0 Dexon in a running fashion for the subcutaneous layers.  The skin was approximated using 5-0 Dexon in a running subcuticular fashion.  Steri-Strips and a sterile dressing were applied and the patient was transported to the recovery area in stable condition in an A sense, V pace mode.  EQUIPMENT DATA:  The pacing generator is a Guidant model Insignia I Plus DR, model number [id number], serial number [id number].  The atrial lead is a Guidant model number [id number], serial number [id number].  The ventricular lead is a Guidant model number [id number], serial number [id number].  PACING DATA:  The ventricular lead detected at 8 mV, R wave.  The pacing threshold was 0.5 volts at 0.5 msec.  The impedance was 525 ohms.  The atrial lead detected at 2.8 mV, P wave.  The pacing threshold was 0.8 volts at 0.5 msec and the impedance was 326 ohms. Dictated by:   Barnett Abu, M.D. Attending Physician:  Irven Shelling DD:  08/21/01 TD:  08/21/01 Job: [id number]

## 2010-12-31 NOTE — Op Note (Signed)
[place]. [facility]  Patient:    Kristi Parker, Kristi Parker Visit Number: [id number] MRN: [id number]          Service Type: MED Location: [id number] [phone] Attending Physician:  Rodell Perna Dictated by:   Barnett Abu, M.D. Admit Date:  08/27/2001 Discharge Date: 08/27/2001   CC:         Cardiac Catheterization Laboratory             Delanna Ahmadi, M.D.                           Operative Report  PROCEDURE:  Lead revision.  SURGEON:  Barnett Abu, M.D.  INDICATIONS:  The patient is a 60 year old woman who is now seven days s/p PTVP for complete heart block.  Friday of last week, she developed diaphragmatic pacing.  Chest x-ray revealed lead dislodgement, primarily the atrial lead, but also the ventricular lead.  Both appeared to have been pulled back.  She is brought now therefore to the cardiac catheterization laboratory for lead revision.  Suspect, we will have to reposition the atrial lead.  PROCEDURAL NOTE:  The patient was brought to the cardiac catheterization laboratory in the postabsorptive state.  The left prepectoral region was prepped and draped in the usual sterile fashion.  Local anesthesia was obtained with infiltration of 1% lidocaine with epinephrine throughout.  The previous pacemaker incision was incised with a #11 blade.  Additional anesthesia was delivered into the subcutaneous tissues and the subcutaneous sutures were cut with scissors.  The pacemaker was exposed and the anchoring suture severed.  The pacemaker was delivered and the pacemaker wires were detached from the pacing generator.  The sutures on the wire collars were all incised using scalpel.  They were freed up from the pectoral muscle.  A long straight wire was inserted into the ventricular lead, and was advanced a moderate length, approximately 5-6 cm.  The lead was tested for adequate pacing parameters which were found to be excellent.  The lead was then sutured into  place again using three separate 0 silk ligatures.  It was tested for diaphragmatic pacing at 10 V and none was found.  A J-wire was advanced into the atrial lead and the atrial lead was manipulated into the right atrial appendage.  There, adequate pacing parameters were obtained in the active fixation lead.  It was screwed into place.  The J-wire we removed and a generous amount of "plaque" was left within the right atrium on this lead. The lead was tested for diaphragmatic pacing at 10 V and none was found.  The lead was sutured into place using three separate 0 silk ligatures.  Again, an anchoring suture was made into the pectoralis muscle and the pacing generator placed in the pocket, sutured into place with the leads wound beneath.  The pocket was copiously irrigated prior to this using 1% kanamycin solution.  The pocket was then closed using 2-0 Dexon in a running fashion for two subcutaneous layers.  The skin was approximated using 5-0 Dexon in a running subcuticular fashion.  Steri-Strips and a sterile dressing were applied, and the patient was transported to the recovery area in an A-paced, V-paced mode at 50 beats per minute.  EQUIPMENT DATA:  Please see the note dated August 21, 2001.  This information is unchanged.  PACING DATA:  The ventricular lead detected an 11 mV R-wave.  The pacing threshold was 0.6  V at 0.5 msec and the impedance was 399 ohms.  The atrial lead detected a 2.0 mV P-wave.  The pacing threshold was 0.4 V and the impedance was 333 ohms. Dictated by:   Barnett Abu, M.D. Attending Physician:  Rodell Perna DD:  08/27/01 TD:  08/28/01 Job: [id number]

## 2010-12-31 NOTE — Discharge Summary (Signed)
[place]. [facility]  Patient:    Kristi Parker, Kristi Parker Visit Number: [id number] MRN: [id number]          Service Type: MED Location: [id number] [phone] Attending Physician:  Rodell Perna Dictated by:   Delanna Ahmadi, M.D. Admit Date:  08/27/2001 Discharge Date: 08/28/2001                             Discharge Summary  REASON FOR ADMISSION:  This is a 60 year old white female who has presented with episodes of chest tightness, lightheadedness, and weakness.  Significant findings were a blood pressure 130/84, heart rate 130, temperature was 97.2.  LABORATORY DATA:  EKG showed complete heart block with a rate in the 30s. Chest x-ray showed no acute disease.  WBC 6.0, hemoglobin 5.4, platelet count 137,000.  Chemistry showed sodium of 146, potassium 3.7, chloride 112, bicarbonate 29, glucose 114, BUN 10, creatinine 1.1, calcium 9.2.  CK 121, CK-MB 1.5, troponin I 0.03.  PT 13.7, PTT 29.  TSH 0.929.  Tegretol level is 7.5.  HOSPITAL COURSE:  The patient was admitted to telemetry with complete heart block.  She remained hemodynamically stable.  She was seen by Dr. Barnett Abu of cardiology.  On August 21, 2001, she underwent insertion of a dual chamber permanent pacemaker without complication.  This was documented as functioning normally on August 22, 2001.  She was discharged to home in good condition.  DISCHARGE DIAGNOSIS:  Complete heart block.  PROCEDURE:  Permanent pacemaker insertion by Dr. Barnett Abu.  DISCHARGE MEDICATIONS: 1. Tegretol XR 200 mg b.i.d. 2. Vicodin 1-2 q.6h. p.r.n.  ACTIVITY:  No restrictions.  DIET:  No restrictions.  WOUND CARE:  Do not remove Steri-Strips from pacemaker site.  FOLLOW-UP:  Follow up with Dr. Barnett Abu on September 10, 2001 at 12:30. Dictated by:   Delanna Ahmadi, M.D. Attending Physician:  Rodell Perna DD:  09/12/01 TD:  09/13/01 Job: [id number]

## 2010-12-31 NOTE — Consult Note (Signed)
[place]. [facility]  Patient:    Kristi Parker, Kristi Parker Visit Number: [id number] MRN: [id number]          Service Type: MED Location: [id number] Attending Physician:  Irven Shelling Dictated by:   Barnett Abu, M.D. Proc. Date: 08/21/01 Admit Date:  08/20/2001   CC:         Delanna Ahmadi, M.D.   Consultation Report  REASON FOR CONSULTATION:  Bradycardia.  HISTORY OF PRESENT ILLNESS:  Kristi Parker is a 60 year old woman with a history of multiple sclerosis for the past 18 years and a seizure disorder for over four years who presented to the office yesterday with chest tightness and exertional dyspnea that had been occurring for the past three days.  She was initially woke from sleep with discomfort and dyspnea, sat up, and then her symptoms subsided.  She also noted that trying to walk up a lengthy hill that was not terribly steep caused her to be quite out of breath and it took her an extended period of time to recover.  This was all new for her.  The chest tightness began one day prior to admission and was not associated necessarily with exertion.  There was no nausea, vomiting, or diaphoresis.  She did have the dyspnea as mentioned above.  Finally, this morning she had an episode of tingling that started at the top of her head and went down to her arms.  This was a prodrome for a previous seizure disorder, but none occurred.  She initially went to her neurologists office but then she was sent to Dr. Wonda Amis for further evaluation of the chest discomfort.  PAST MEDICAL HISTORY:  GERD, nontoxic goiter 1996 with thyroid scan uptake normal, seizure disorder for the past four years, partial and simple seizures, multiple sclerosis followed by Dr. Jacolyn Reedy.  Also has moderate obesity.  PAST SURGICAL HISTORY:  Status post bilateral tubal ligation.  CURRENT MEDICATIONS:  Tegretol XR 200 mg p.o. b.i.d.  SOCIAL HISTORY:  No alcohol or tobacco  intake.  She is a Licensed conveyancer at [employer]  Married and has three children.  FAMILY HISTORY:  Father died age 59, had cancer.  Mother is alive, age 59, has multiple sclerosis.  REVIEW OF SYSTEMS:  She complains of postnasal drip.  Has not had fever or dizziness.  No syncope or presyncopal symptoms.  No cough.  Otherwise, her review of systems is unremarkable.  PHYSICAL EXAMINATION  VITAL SIGNS:  Blood pressure 130/84, pulse 40 and regular, respiratory rate 16, temperature 97.2, weight 186 pounds, height 5 feet 3 inches.  GENERAL:  This is a pleasant 60 year old woman in no acute distress.  HEENT:  ______.  Pupils are equal, round and reactive to light and accommodation.  Extraocular movements are intact.  Oral mucosa is pink and moist.  Tongue is not coated.  NECK:  Supple without thyromegaly or masses.  Carotid upstrokes are normal. No bruit.  No jugular venous distention.  CHEST:  Clear with adequate excursion.  HEART:  Normal vesicular breath sounds are heard throughout.  The precordium is quiet.  Normal S1, S2.  No S3, S4, murmur, click, or rub noted.  Quite bradycardic.  ABDOMEN:  Obese, soft, nontender.  No hepatosplenomegaly or midline pulsatile mass.  PELVIC:  External genitalia is without significant lesions.  RECTAL:  Not performed.  EXTREMITIES:  Full range of motion.  No edema.  Intact distal pulses.  NEUROLOGIC:  Cranial nerves 2-12 are intact.  Motor and sensory grossly intact.  Gait not tested.  SKIN:  Warm, dry, and clear.  LABORATORIES:  Electrocardiogram:  This reveals AV dissociation with complete heart block, atrial rate 80 beats per minute, ventricular rate 45 beats per minute.  Chest x-ray shows no active cardiopulmonary disease.  Initial CK, MB, and troponin negative.  IMPRESSION: 1. New onset of complete heart block.  I suspect that all of her symptoms of    dyspnea and chest tightness are related to her relative bradycardia.     Etiology for this finding in this 60 year old is uncertain but it certainly    has been known to occur. 2. Multiple sclerosis.  Not significantly impaired by this diagnosis. 3. History of seizure disorders, well controlled on Tegretol.  Of note,    Tegretol is not known to produce cardiac conduction system disorder. 4. Gastroesophageal reflux disease.  PLAN:  The patient has been consulted on undergoing and accepted permanent transvenous pacemaker insertion.  Advised of the risks and alternatives and potential benefits of this therapy.  She states understanding, has had her questions answered, and wishes to proceed.  Plan for insertion later on today.  Thank you very much in allowing me to assist in the care of Kristi Parker. It has been a pleasure to do so.  I will discuss her further care with you. Dictated by:   Barnett Abu, M.D. Attending Physician:  Irven Shelling DD:  08/21/01 TD:  08/21/01 Job: [id number]

## 2010-12-31 NOTE — Discharge Summary (Signed)
NAME:  Kristi Parker, Kristi Parker                       ACCOUNT NO.:  [id number]   MEDICAL RECORD NO.:  [id number]                   PATIENT TYPE:  INP   LOCATION:  [id number]                                 FACILITY:  [facility]   PHYSICIAN:  Pramod P. Leonie Man, MD                 DATE OF BIRTH:  [birth year]   DATE OF ADMISSION:  08/13/2003  DATE OF DISCHARGE:  08/21/2003                                 DISCHARGE SUMMARY   ADMISSION DIAGNOSIS:  Stroke.   DISCHARGE DIAGNOSES:  1. Right middle cerebral artery branch infarction of possible cardioembolic     etiology.  2. Hyperlipidemia.  3. Hypertension.  4. Multiple sclerosis.   HOSPITAL COURSE:  Kristi Parker is a 60 year old, African-American lady who  was admitted for evaluation of onset left-sided weakness and dysarthria.  She presented with onset of her symptoms and was not a candidate for IV  thrombolysis or stroke study.  She initially thought her symptoms were  related to exacerbation.  Kindly see Dr. Asencion Partridge Dohmeier's H&P dictated on  August 13, 2003, for details. When evaluated in the emergency room, she  was found to have mild left facial droop as well as numbness in the face.  She had 3/5 left hemiparesis without significant sensory loss.  Noncontrast  CAT scan of the head showed a low density in the right deep white matter  consistent with acute ischemia.  She had a pacemaker and hence, MRI could  not be done.  She was admitted to the stroke service for risk stratification  evaluation.  She was monitored on telemetry and did not have cardiac  arrhythmia.  Repeat CT scan confirmed the right middle cerebral artery  branch infarction involving the frontal and temporal lobes.  CT angiogram  did not reveal significant vascular stenosis.  Carotid ultrasound showed no  significant stenosis.  Vertebral artery flow was antegrade.  A 2D  echocardiogram was done on August 14, 2003, and it showed slight decreased  left ventricular systolic function  with ejection fraction 40-50% with  diffuse left ventricular hypokinesis.  Her lipid profile was evaluated with  LDL being evaluated at 128, total cholesterol 193, HDL 48.  Homocystine was  normal.  ANA was negative.  ESR was normal.  TSH was normal.  WBC,  electrolytes and liver enzymes were all normal.  The patient was started on  IV heparin secondary to stroke prevention and subsequently changed to oral  Warfarin.  She was seen on consultation by physical and occupational therapy  as well as speech therapy.  She was initially considered a good candidate  for rehabilitation and hence, rehabilitation services were consulted.  However, the patient showed significant improvement and rehabilitation  consult was canceled and she was referred for outpatient physical and  occupational therapy.  At the time of discharge, she was stable and her INR  was therapeutic on Coumadin.   DISCHARGE MEDICATIONS:  1. Coumadin 5  mg a day on January 6, January 7, January 8 and January 9.  2. Tegretol 200 mg twice a day.  3. Coreg 25 mg twice a day.  4. Prinivil 10 mg once a day.  5. Zocor 20 mg once a day.   FOLLOW UP:  Follow up with physician, Dr. Felipa Eth in her office on  August 25, 2003, for PT/INR and further advice for Coumadin dosage as per  Dr. Arnoldo Morale.  She was asked to call Dr. Clydene Fake office at [phone], to make  an appointment to see him in two months.   DIET:  Low fat, low salt diet.   ACTIVITY:  Occupational and physical therapy twice weekly x6 weeks.                                                Pramod P. Leonie Man, MD    PPS/MEDQ  D:  10/02/2003  T:  10/03/2003  Job:  [id number]   cc:   Deborah Chalk, M.D.  [address]  [place] [address]  Fax: [phone]

## 2010-12-31 NOTE — H&P (Signed)
NAME:  Kristi Parker, Kristi Parker                       ACCOUNT NO.:  [id number]   MEDICAL RECORD NO.:  [id number]                   PATIENT TYPE:  INP   LOCATION:  3707                                 FACILITY:  [facility]   PHYSICIAN:  Larey Seat, M.D.               DATE OF BIRTH:  1951-01-17   DATE OF ADMISSION:  08/13/2003  DATE OF DISCHARGE:                                HISTORY & PHYSICAL   REASON. FOR ADMISSION:  Ms. Stiner is a 60 year old African American right-  handed female who presents with acute onset of left-sided weakness and  dysarthria earlier this morning, more than seven hours ago.   The patient has a past medical history of MS and had attributed the deficit  to an MS exacerbation as did her husband.  When she felt that her sensory  and motor strength were worsening, she presented here to the ER at [facility] where I was asked to see her by Dr. Druscilla Brownie,  ER attending. Significant findings by history are a history of pacemaker  implantation for sinus bradycardia by Dr. Leonia Reeves as of January 2003,  history of MS followed by Dr. Marney Setting at [facility], and a history of hypertension followed by her primary care  physician at [facility].   SOCIAL HISTORY:  The patient is married and fully ambulatory prior to today.  She states she had no visual deficits or cognitive impairment from MS.  According to her medical records, she is not on any immune therapy for the  neurologic disorder. She has never needed any aspirin or anticoagulation.  She has no history of TIA.   FAMILY HISTORY:  Positive for hypertension.   MEDICATIONS:  1. Tegretol XR 200 mg b.i.d.  2. Coreg 25 mg b.i.d.  3. Allegra 180 mg p.r.n.  4. Lisinopril 10 mg q.a.m.,which she has not taken today.   ALLERGIES:  She has no known drug allergies.   PHYSICAL EVALUATION:  VITAL SIGNS: Heart rate 88, respiratory rate 16,  temperature 97.4, blood  pressure 138/78 by monitor.  LUNGS: Clear to auscultation.  COR: Regular rate and rhythm. No murmur. By EKG, the patient has irregular  paced beats every 30 seconds or so. Pulses are palpable all over the  periphery. Her carotid pulses are easily auscultated and show no bruit. She  has no neck vein distention and no tongue biting. No rash, bruises, or  cyanosis.  NEUROLOGIC: Pupils are equal to light. No palpable edema. Full extraocular  movements.  No visual field deficits by finger parametry with bilateral  tactile stimuli. Conjugate eye movements. Tongue and uvula are delayed by  gag response. Tongue deviates to the left. There is also a left facial droop  and not involving the left forehead.  The patient also has nasolabial fold  asymmetry in spite of preserved eye closure strength. Left-sided facial  numbness. Motor exam shows the  left arm unable to be fully extended and  lifted. The patient has also decreased wrist flexion and extension that I  would rate at 3/5. Her grip is weaker, left over right, but still present.  She states that her left arm feels heavier, but she denies any significant  primary sensory deficits to fine touch, pinprick, or vibration. Coordination  cannot be performed by finger-to-nose test.  The patient is unable to lift  her left arm high enough. She can do this with the right arm without  problems and shows no evidence of left to right confusion. Deep tendon  reflexes still seem to be absent in the left upper extremity and left lower  extremity, and I can also not elicit a Babinski. On the right side she is 1+  with downgoing toes to plantar stimulation. Sensory again over the left face  reveals a numbness to fine touch with Q-tip test. She does have pinprick  sensation and coarse touch sensation preserved. She states that her left arm  feels tingly and she has the same sensation in her left leg, but she does  not feel that this has been accompanied by a loss  of other sensory  qualities. Gait testing is deferred.  The patient can sit up without  assistance and does not drift.   LABORATORY AND ACCESSORY DATA:  CAT scan reveals right-sided deep white  matter edema is noted beginning in the anterior centrum semiovale. This  edema is most likely due to an ischemic MCA event in the right side, not  involving the ACA or PCA territory.   PLAN:  Labs and other imaging studies are pending. The patient cannot have  an MRI or MRA due to her pacemaker placement. We will obtain a CT  angiography for the intracranial vessels, carotid Doppler studies,  transcranial Doppler studies, and an echocardiogram.  She will further be  evaluated for cardiac arrhythmia and hypocoagulation. She will be admitted  to stroke MD service.                                                Larey Seat, M.D.    CD/MEDQ  D:  08/13/2003  T:  08/13/2003  Job:  [id number]   cc:   Delanna Ahmadi, M.D.  [address] [place] [address]  Fax: Chinle Jacolyn Reedy, M.D.  [address] [place] [address]  Fax: [phone]   Barnett Abu, M.D.  [place] [employer]  [address]  [place] [address]  Fax: [phone]

## 2011-05-09 LAB — PROTIME-INR
INR: 2.6 — ABNORMAL HIGH
Prothrombin Time: 29 — ABNORMAL HIGH

## 2011-05-11 LAB — CBC
HCT: 33.8 — ABNORMAL LOW
Hemoglobin: 11.8 — ABNORMAL LOW
MCHC: 34.9
MCV: 92.4
Platelets: 101 — ABNORMAL LOW
RBC: 3.66 — ABNORMAL LOW
RDW: 14.4
WBC: 5.3

## 2011-05-11 LAB — BASIC METABOLIC PANEL
BUN: 12
CO2: 27
Calcium: 9.1
Chloride: 107
Creatinine, Ser: 1.59 — ABNORMAL HIGH
GFR calc Af Amer: 41 — ABNORMAL LOW
GFR calc non Af Amer: 34 — ABNORMAL LOW
Glucose, Bld: 106 — ABNORMAL HIGH
Potassium: 3.9
Sodium: 140

## 2011-05-11 LAB — PROTIME-INR
INR: 4.1 — ABNORMAL HIGH
Prothrombin Time: 41.6 — ABNORMAL HIGH

## 2011-05-17 LAB — PROTIME-INR
INR: 1.9 — ABNORMAL HIGH
INR: 2.1 — ABNORMAL HIGH
Prothrombin Time: 23.3 — ABNORMAL HIGH
Prothrombin Time: 24.6 — ABNORMAL HIGH

## 2011-06-20 ENCOUNTER — Encounter: Payer: Self-pay | Admitting: Gynecology

## 2011-06-20 DIAGNOSIS — D219 Benign neoplasm of connective and other soft tissue, unspecified: Secondary | ICD-10-CM | POA: Insufficient documentation

## 2011-07-05 ENCOUNTER — Encounter: Payer: BC Managed Care – PPO | Admitting: Obstetrics and Gynecology

## 2011-08-17 ENCOUNTER — Other Ambulatory Visit (HOSPITAL_COMMUNITY)
Admission: RE | Admit: 2011-08-17 | Discharge: 2011-08-17 | Disposition: A | Discharge status: Home / Self Care | Payer: BC Managed Care – PPO | Source: Ambulatory Visit | Attending: Obstetrics and Gynecology | Admitting: Obstetrics and Gynecology

## 2011-08-17 ENCOUNTER — Encounter: Payer: Self-pay | Admitting: Obstetrics and Gynecology

## 2011-08-17 ENCOUNTER — Ambulatory Visit (INDEPENDENT_AMBULATORY_CARE_PROVIDER_SITE_OTHER): Payer: BC Managed Care – PPO | Admitting: Obstetrics and Gynecology

## 2011-08-17 ENCOUNTER — Encounter: Payer: BC Managed Care – PPO | Admitting: Obstetrics and Gynecology

## 2011-08-17 VITALS — BP 110/60 | Ht 63.0 in | Wt 134.0 lb

## 2011-08-17 DIAGNOSIS — Z01419 Encounter for gynecological examination (general) (routine) without abnormal findings: Secondary | ICD-10-CM | POA: Insufficient documentation

## 2011-08-17 LAB — URINALYSIS
Bilirubin Urine: NEGATIVE
Glucose, UA: NEGATIVE mg/dL
Hgb urine dipstick: NEGATIVE
Ketones, ur: NEGATIVE mg/dL
Nitrite: NEGATIVE
Protein, ur: NEGATIVE mg/dL
Specific Gravity, Urine: 1.02 (ref 1.005–1.030)
Urobilinogen, UA: 0.2 mg/dL (ref 0.0–1.0)
pH: 5.5 (ref 5.0–8.0)

## 2011-08-17 NOTE — Progress Notes (Signed)
Patient came to see me today for her annual GYN exam. She is having no hot flashes. She does not have vaginal dryness. She is having no vaginal bleeding. She has had no pelvic pain. She has a variety of medical problems including multiple sclerosis, cardiomyopathy, atrial fibrillation, pacemaker, and a previous CVA but is really doing well medically. She had mammogram in 2012 which was normal and she will get [place] the report. She had a normal bone density.  Physical examination:  Caryn Bee present. HEENT within normal limits. Neck: Thyroid not large. No masses. Supraclavicular nodes: not enlarged. Breasts: Examined in both sitting midline position. No skin changes and no masses. Abdomen: Soft no guarding rebound or masses or hernia. Pelvic: External: Within normal limits. BUS: Within normal limits. Vaginal:within normal limits. Good estrogen effect. No evidence of cystocele rectocele or enterocele. Cervix: clean. Uterus: Normal size and shape. Adnexa: No masses. Rectovaginal exam: Confirmatory and negative. Extremities: Within normal limits.  Assessment: Normal GYN exam  Plan: Continue yearly mammograms.

## 2013-05-27 ENCOUNTER — Encounter: Payer: Self-pay | Admitting: Internal Medicine

## 2013-05-27 DIAGNOSIS — I4892 Unspecified atrial flutter: Secondary | ICD-10-CM

## 2013-05-31 ENCOUNTER — Ambulatory Visit (INDEPENDENT_AMBULATORY_CARE_PROVIDER_SITE_OTHER): Payer: Medicare Other | Admitting: Pharmacist

## 2013-05-31 DIAGNOSIS — I4891 Unspecified atrial fibrillation: Secondary | ICD-10-CM

## 2013-05-31 LAB — POCT INR: INR: 2.1

## 2013-06-04 ENCOUNTER — Ambulatory Visit (INDEPENDENT_AMBULATORY_CARE_PROVIDER_SITE_OTHER): Payer: Medicare Other | Admitting: Cardiology

## 2013-06-04 ENCOUNTER — Encounter: Payer: Self-pay | Admitting: Cardiology

## 2013-06-04 VITALS — BP 90/60 | HR 63 | Ht 63.0 in | Wt 135.0 lb

## 2013-06-04 DIAGNOSIS — D219 Benign neoplasm of connective and other soft tissue, unspecified: Secondary | ICD-10-CM

## 2013-06-04 DIAGNOSIS — Z950 Presence of cardiac pacemaker: Secondary | ICD-10-CM

## 2013-06-04 DIAGNOSIS — I10 Essential (primary) hypertension: Secondary | ICD-10-CM

## 2013-06-04 DIAGNOSIS — I428 Other cardiomyopathies: Secondary | ICD-10-CM

## 2013-06-04 DIAGNOSIS — G35 Multiple sclerosis: Secondary | ICD-10-CM

## 2013-06-04 DIAGNOSIS — D259 Leiomyoma of uterus, unspecified: Secondary | ICD-10-CM

## 2013-06-04 DIAGNOSIS — I2789 Other specified pulmonary heart diseases: Secondary | ICD-10-CM

## 2013-06-04 DIAGNOSIS — I4891 Unspecified atrial fibrillation: Secondary | ICD-10-CM

## 2013-06-04 DIAGNOSIS — I4892 Unspecified atrial flutter: Secondary | ICD-10-CM

## 2013-06-04 DIAGNOSIS — IMO0002 Reserved for concepts with insufficient information to code with codable children: Secondary | ICD-10-CM

## 2013-06-04 HISTORY — DX: Reserved for concepts with insufficient information to code with codable children: IMO0002

## 2013-06-04 MED ORDER — CARVEDILOL 12.5 MG PO TABS: 12.5000 mg | ORAL_TABLET | Freq: Two times a day (BID) | ORAL | Status: DC

## 2013-06-04 MED ORDER — LISINOPRIL 2.5 MG PO TABS: 2.5000 mg | ORAL_TABLET | Freq: Every day | ORAL | Status: DC

## 2013-06-04 NOTE — Progress Notes (Signed)
[facility]. [address] [place], [place]  [address] Phone: [phone] Fax:  [phone]  Date:  06/04/2013   ID:  Kristi Parker, DOB 04-Jun-1951, MRN [id number]  PCP:  Irven Shelling, MD   History of Present Illness: Kristi Parker is a 62 y.o. female with nonischemic cardiomyopathy ejection fraction 25-30%, secondary pulmonary hypertension as a result of this, prior stroke, atrial fibrillation on chronic anticoagulation, biventricular pacemaker 2009, moderate mitral regurgitation seen on TEE in 2011 here for followup. She's off of amiodarone because of persistent/chronic atrial fibrillation.  Shortness of breath has been an issue, occasional at times when climbing stairs and remains unchanged.  Kristi Parker is watching coumadin. Doing well.   NYHA 2. Doing exercise in morning. Two walks a day. Watching was she is eating. Occasional dizziness. Her blood pressure originally today was 90/60. Quite low. I reviewed her medications as below. Made changes as below.   Wt Readings from Last 3 Encounters:  06/04/13 135 lb (61.236 kg)  08/17/11 134 lb (60.782 kg)  02/18/10 152 lb 4 oz (69.06 kg)     Past Medical History  Diagnosis Date  . Fibroid   . Atrial fibrillation   . Stroke   . Multiple sclerosis   . Pulmonary hypertension, secondary 06/04/2013    As a result of nonischemic cardiomyopathy EF 25-30%    Past Surgical History  Procedure Laterality Date  . Tubal ligation    . Hysteroscopy    . Dilation and curettage of uterus    . Pacemaker insertion      Current Outpatient Prescriptions  Medication Sig Dispense Refill  . Calcium Carbonate-Vitamin D (CALCIUM + D PO) Take by mouth.        . Carvedilol (COREG PO) Take by mouth.        [name] LISINOPRIL PO Take by mouth.        . Loratadine (CLARITIN PO) Take by mouth.        . Warfarin Sodium (COUMADIN PO) Take by mouth.        . Ezetimibe-Simvastatin (VYTORIN PO) Take by mouth.         No current  facility-administered medications for this visit.    Allergies:   No Known Allergies  Social History:  The patient  reports that she has never smoked. She does not have any smokeless tobacco history on file. She reports that she drinks alcohol.   ROS:  Please see the history of present illness.  No syncope, no bleeding, no orthopnea, no PND. She still having occasional shortness of breath when bending over or walking up stairs. She's also having some dizziness at times. Could be secondary to hypotension.   All other systems reviewed and negative.   PHYSICAL EXAM: VS:  BP 90/60  Pulse 63  Ht 5\' 3"  (1.6 m)  Wt 135 lb (61.236 kg)  BMI 23.92 kg/m2 Well nourished, well developed, in no acute distress HEENT: normal Neck: no JVD Cardiac:  normal S1, S2; RRR; soft systolic murmurLeft lower sternal Lungs:  clear to auscultation bilaterally, no wheezing, rhonchi or rales Abd: soft, nontender, no hepatomegaly Ext: no edema Skin: warm and dry Neuro: no focal abnormalities noted  EKG:  Biventricular pacing rate 63, underlying atrial fibrillation, no change from prior.     ASSESSMENT AND PLAN:  1. Nonischemic cardiomyopathy-with relative hypotension, I will cut back her carvedilol from 25 mg twice a day down to 12.5 twice a day and I will  cut her lisinopril back from 5 mg once a day down to 2.5 mg once a day. Her husband has a blood pressure cuff and I would like her to monitor this. This may ultimately be the reason for her occasional dizziness. We'll see her back in one month for this. 2. Pacemaker-functioning well. 3. Atrial fibrillation-currently anticoagulated. Coumadin monitored by Alferd Apa, Pharm.D. 4. Pulmonary hypertension, secondary-as a result of cardiomyopathy. Overall mild. Well controlled. 5. Moderate mitral regurgitation-murmur is not that impressive. Continue to monitor. Clinically doing well.  Signed, Candee Furbish, MD [facility]  06/04/2013 11:29 AM

## 2013-06-04 NOTE — Patient Instructions (Addendum)
Your physician has recommended you make the following change in your medication:   1. Decrease Lisinopril to 2.5mg  once daily. 2. Decrease Carvedilol to 12.5mg  twice daily  Your physician recommends that you schedule a follow-up appointment in: 1 month with Dr. Marlou Porch

## 2013-06-28 ENCOUNTER — Ambulatory Visit (INDEPENDENT_AMBULATORY_CARE_PROVIDER_SITE_OTHER): Payer: Medicare Other | Admitting: Pharmacist

## 2013-06-28 DIAGNOSIS — I4891 Unspecified atrial fibrillation: Secondary | ICD-10-CM

## 2013-06-28 LAB — POCT INR: INR: 3.5

## 2013-07-18 ENCOUNTER — Ambulatory Visit (INDEPENDENT_AMBULATORY_CARE_PROVIDER_SITE_OTHER): Payer: Medicare Other | Admitting: Cardiology

## 2013-07-18 ENCOUNTER — Ambulatory Visit (INDEPENDENT_AMBULATORY_CARE_PROVIDER_SITE_OTHER): Payer: Medicare Other | Admitting: Pharmacist

## 2013-07-18 ENCOUNTER — Encounter: Payer: Self-pay | Admitting: Cardiology

## 2013-07-18 VITALS — BP 108/72 | HR 74 | Ht 63.0 in | Wt 133.0 lb

## 2013-07-18 DIAGNOSIS — I428 Other cardiomyopathies: Secondary | ICD-10-CM

## 2013-07-18 DIAGNOSIS — I2789 Other specified pulmonary heart diseases: Secondary | ICD-10-CM

## 2013-07-18 DIAGNOSIS — I442 Atrioventricular block, complete: Secondary | ICD-10-CM

## 2013-07-18 DIAGNOSIS — IMO0002 Reserved for concepts with insufficient information to code with codable children: Secondary | ICD-10-CM

## 2013-07-18 DIAGNOSIS — I5022 Chronic systolic (congestive) heart failure: Secondary | ICD-10-CM

## 2013-07-18 DIAGNOSIS — I4891 Unspecified atrial fibrillation: Secondary | ICD-10-CM

## 2013-07-18 DIAGNOSIS — I10 Essential (primary) hypertension: Secondary | ICD-10-CM

## 2013-07-18 DIAGNOSIS — Z950 Presence of cardiac pacemaker: Secondary | ICD-10-CM

## 2013-07-18 LAB — CBC WITH DIFFERENTIAL/PLATELET
Basophils Absolute: 0 10*3/uL (ref 0.0–0.1)
Basophils Relative: 0.2 % (ref 0.0–3.0)
Eosinophils Absolute: 0 10*3/uL (ref 0.0–0.7)
Eosinophils Relative: 0.5 % (ref 0.0–5.0)
HCT: 34 % — ABNORMAL LOW (ref 36.0–46.0)
Hemoglobin: 11.4 g/dL — ABNORMAL LOW (ref 12.0–15.0)
Lymphocytes Relative: 22.1 % (ref 12.0–46.0)
Lymphs Abs: 1.2 10*3/uL (ref 0.7–4.0)
MCHC: 33.6 g/dL (ref 30.0–36.0)
MCV: 93.5 fl (ref 78.0–100.0)
Monocytes Absolute: 0.4 10*3/uL (ref 0.1–1.0)
Monocytes Relative: 7.6 % (ref 3.0–12.0)
Neutro Abs: 3.7 10*3/uL (ref 1.4–7.7)
Neutrophils Relative %: 69.6 % (ref 43.0–77.0)
Platelets: 110 10*3/uL — ABNORMAL LOW (ref 150.0–400.0)
RBC: 3.64 Mil/uL — ABNORMAL LOW (ref 3.87–5.11)
RDW: 14.3 % (ref 11.5–14.6)
WBC: 5.4 10*3/uL (ref 4.5–10.5)

## 2013-07-18 LAB — BASIC METABOLIC PANEL
BUN: 12 mg/dL (ref 6–23)
CO2: 28 mEq/L (ref 19–32)
Calcium: 9.8 mg/dL (ref 8.4–10.5)
Chloride: 103 mEq/L (ref 96–112)
Creatinine, Ser: 1.3 mg/dL — ABNORMAL HIGH (ref 0.4–1.2)
GFR: 54.88 mL/min — ABNORMAL LOW (ref >60.00)
Glucose, Bld: 75 mg/dL (ref 70–99)
Potassium: 4.3 mEq/L (ref 3.5–5.1)
Sodium: 139 mEq/L (ref 135–145)

## 2013-07-18 LAB — LIPID PANEL
Cholesterol: 175 mg/dL (ref 0–200)
HDL: 46.4 mg/dL (ref >39.00)
LDL Cholesterol: 104 mg/dL — ABNORMAL HIGH (ref 0–99)
Total CHOL/HDL Ratio: 4
Triglycerides: 122 mg/dL (ref 0.0–149.0)
VLDL: 24.4 mg/dL (ref 0.0–40.0)

## 2013-07-18 LAB — POCT INR: INR: 2.6

## 2013-07-18 NOTE — Patient Instructions (Addendum)
Your physician recommends that you have labs today: CBC, BMET, Lipid  Your physician wants you to follow-up in: 6 months with Dr. Dawna Part will receive a reminder letter in the mail two months in advance. If you don't receive a letter, please call our office to schedule the follow-up appointment.

## 2013-07-18 NOTE — Progress Notes (Signed)
[facility]. [address], [address] [place], [place]  [address] Phone: [phone] Fax:  [phone]  Date:  07/18/2013   ID:  Kristi Parker, DOB Nov 14, 1950, MRN [id number]  PCP:  Irven Shelling, MD   History of Present Illness: Kristi Parker is a 62 y.o. female with nonischemic cardiomyopathy ejection fraction 25-30%, secondary pulmonary hypertension as a result of this, prior stroke, atrial fibrillation on chronic anticoagulation, biventricular pacemaker 2009, moderate mitral regurgitation seen on TEE in 2011 here for followup. She's off of amiodarone because of persistent/chronic atrial fibrillation.  Shortness of breath has been an issue, occasional at times when climbing stairs and remains unchanged.  [name] is watching coumadin. Doing well.   NYHA 2. Doing exercise in morning. Two walks a day. Watching was she is eating. Occasional dizziness has improved with decreased medication dose. Made changes as below.   Wt Readings from Last 3 Encounters:  07/18/13 133 lb (60.328 kg)  06/04/13 135 lb (61.236 kg)  08/17/11 134 lb (60.782 kg)     Past Medical History  Diagnosis Date  . Fibroid   . Atrial fibrillation   . Stroke   . Multiple sclerosis   . Pulmonary hypertension, secondary 06/04/2013    As a result of nonischemic cardiomyopathy EF 25-30%    Past Surgical History  Procedure Laterality Date  . Tubal ligation    . Hysteroscopy    . Dilation and curettage of uterus    . Pacemaker insertion      Current Outpatient Prescriptions  Medication Sig Dispense Refill  . Calcium Carbonate-Vitamin D (CALCIUM + D PO) Take 1 tablet by mouth 2 (two) times daily.       . carvedilol (COREG) 12.5 MG tablet Take 1 tablet (12.5 mg total) by mouth 2 (two) times daily.  60 tablet  3  . fexofenadine (ALLEGRA) 180 MG tablet Take 180 mg by mouth daily.      . furosemide (LASIX) 40 MG tablet Take 40 mg by mouth daily.       [name] lisinopril (PRINIVIL,ZESTRIL) 2.5 MG  tablet Take 1 tablet (2.5 mg total) by mouth daily.  30 tablet  3  . Loratadine (CLARITIN PO) Take by mouth.        . spironolactone (ALDACTONE) 25 MG tablet Take 12.5 mg by mouth daily.      . Warfarin Sodium (COUMADIN PO) Take by mouth as directed. 2.5mg  daily except M/F take 5mg        No current facility-administered medications for this visit.    Allergies:   No Known Allergies  Social History:  The patient  reports that she has never smoked. She does not have any smokeless tobacco history on file. She reports that she drinks alcohol.   ROS:  Please see the history of present illness.  No syncope, no bleeding, no orthopnea, no PND. She still having occasional shortness of breath when bending over or walking up stairs. She's also having some dizziness at times. Could be secondary to hypotension.   All other systems reviewed and negative.   PHYSICAL EXAM: VS:  BP 108/72  Pulse 74  Ht 5\' 3"  (1.6 m)  Wt 133 lb (60.328 kg)  BMI 23.57 kg/m2 Well nourished, well developed, in no acute distress HEENT: normal Neck: no JVD Cardiac:  normal S1, S2; RRR; soft systolic murmurLeft lower sternal Lungs:  clear to auscultation bilaterally, no wheezing, rhonchi or rales Abd: soft, nontender, no hepatomegaly Ext: no edema  Skin: warm and dry Neuro: no focal abnormalities noted  EKG:  Biventricular pacing rate 63, underlying atrial fibrillation, no change from prior.     ASSESSMENT AND PLAN:  1. Nonischemic cardiomyopathy-with relative hypotension, I cut back her carvedilol from 25 mg twice a day down to 12.5 twice a day and I cut her lisinopril back from 5 mg once a day down to 2.5 mg once a day. Her husband has a blood pressure cuff and I would like her to monitor this. This was the likely reason for her occasional dizziness. This has improved, from sitting to standing. 2. Pacemaker-functioning well. I will check basic metabolic profile, CBC, lipids today 3. Atrial fibrillation-currently  anticoagulated. Coumadin monitored by Alferd Apa, Pharm.D. 4. Pulmonary hypertension, secondary-as a result of cardiomyopathy. Overall mild. Well controlled. 5. Moderate mitral regurgitation-murmur is not that impressive. Continue to monitor. Clinically doing well.  Signed, Candee Furbish, MD [facility]  07/18/2013 10:40 AM

## 2013-07-22 ENCOUNTER — Encounter: Payer: Self-pay | Admitting: Cardiology

## 2013-08-16 ENCOUNTER — Ambulatory Visit (INDEPENDENT_AMBULATORY_CARE_PROVIDER_SITE_OTHER): Payer: Medicare Other | Admitting: Pharmacist

## 2013-08-16 DIAGNOSIS — I4891 Unspecified atrial fibrillation: Secondary | ICD-10-CM

## 2013-08-16 LAB — POCT INR: INR: 1.8

## 2013-08-30 ENCOUNTER — Ambulatory Visit (INDEPENDENT_AMBULATORY_CARE_PROVIDER_SITE_OTHER): Payer: Medicare Other | Admitting: Pharmacist

## 2013-08-30 DIAGNOSIS — I4891 Unspecified atrial fibrillation: Secondary | ICD-10-CM

## 2013-08-30 LAB — POCT INR: INR: 2.2

## 2013-09-19 ENCOUNTER — Ambulatory Visit (INDEPENDENT_AMBULATORY_CARE_PROVIDER_SITE_OTHER): Payer: Medicare Other | Admitting: Internal Medicine

## 2013-09-19 ENCOUNTER — Ambulatory Visit (INDEPENDENT_AMBULATORY_CARE_PROVIDER_SITE_OTHER): Payer: Medicare Other | Admitting: *Deleted

## 2013-09-19 ENCOUNTER — Encounter: Payer: Self-pay | Admitting: Internal Medicine

## 2013-09-19 VITALS — BP 114/68 | HR 61 | Ht 63.25 in | Wt 136.1 lb

## 2013-09-19 DIAGNOSIS — I428 Other cardiomyopathies: Secondary | ICD-10-CM

## 2013-09-19 DIAGNOSIS — I4891 Unspecified atrial fibrillation: Secondary | ICD-10-CM

## 2013-09-19 DIAGNOSIS — I442 Atrioventricular block, complete: Secondary | ICD-10-CM

## 2013-09-19 DIAGNOSIS — Z5181 Encounter for therapeutic drug level monitoring: Secondary | ICD-10-CM

## 2013-09-19 DIAGNOSIS — Z950 Presence of cardiac pacemaker: Secondary | ICD-10-CM

## 2013-09-19 LAB — MDC_IDC_ENUM_SESS_TYPE_INCLINIC
Battery Remaining Longevity: 29 mo
Battery Voltage: 2.94 V
Brady Statistic RV Percent Paced: 98 %
Date Time Interrogation Session: 20150205104428
Implantable Pulse Generator Model: 8042
Lead Channel Impedance Value: 0 Ohm
Lead Channel Impedance Value: 455 Ohm
Lead Channel Impedance Value: 504 Ohm
Lead Channel Pacing Threshold Amplitude: 0.5 V
Lead Channel Pacing Threshold Amplitude: 0.5 V
Lead Channel Pacing Threshold Pulse Width: 0.4 ms
Lead Channel Pacing Threshold Pulse Width: 0.6 ms
Lead Channel Setting Pacing Amplitude: 2.5 V
Lead Channel Setting Pacing Amplitude: 2.5 V
Lead Channel Setting Pacing Pulse Width: 0.4 ms
Lead Channel Setting Pacing Pulse Width: 0.6 ms
Lead Channel Setting Sensing Sensitivity: 2.8 mV

## 2013-09-19 LAB — POCT INR: INR: 2.9

## 2013-09-19 NOTE — Progress Notes (Signed)
Kristi Shelling, MD: Primary Cardiologist:   Dr Sheran Spine is a 63 y.o. female with a h/o complete heart block and nonischemic CM sp PPM with subsequent upgrade (MDT) by Dr Leonia Reeves who presents today to establish care in the Electrophysiology device clinic.   The patient reports doing very well since having a pacemaker implanted and remains very active despite her age.   Today, she  denies symptoms of palpitations, chest pain, shortness of breath, orthopnea, PND, lower extremity edema, dizziness, presyncope, syncope, or neurologic sequela.  The patientis tolerating medications without difficulties and is otherwise without complaint today.   Past Medical History  Diagnosis Date  . Fibroid   . Permanent atrial fibrillation   . Multiple sclerosis 1984  . Pulmonary hypertension, secondary 06/04/2013    As a result of nonischemic cardiomyopathy EF 25-30%  . Nonischemic cardiomyopathy     Moderate LVEF 35-40% by ECHO 2011, 25-30% by echo 2013  . Mitral regurgitation     moderate  . Chronic systolic heart failure   . Cardiac pacemaker in situ 06/2008    CRT  . DDD (degenerative disc disease), lumbar   . DDD (degenerative disc disease), cervical   . GERD (gastroesophageal reflux disease)   . Hearing loss 02/2010    L hearing loss/vertigo, steroids  . Adenomatous colon polyp   . Atrial flutter     A fib/flutter DCCV x 2   (05/2008 & 12/2009)  . CKD (chronic kidney disease), stage III   . Stroke     Right brain CVA, complete recovery 07/2003  . History of seizure disorder     Related Hx  . Cervical radiculopathy at C5   . Vertigo 02/2010    L hearing loss/vertigo, steroids  . Left atrial enlargement     severe   Past Surgical History  Procedure Laterality Date  . Tubal ligation    . Hysteroscopy    . Dilation and curettage of uterus    . Pacemaker insertion      PTVP 08/2001 for complete heart block. Upgrade PTVP to MDT BiV 06/2008 by Dr Leonia Reeves    History     Social History  . Marital Status: Married    Spouse Name: N/A    Number of Children: N/A  . Years of Education: N/A   Occupational History  . Not on file.   Social History Main Topics  . Smoking status: Never Smoker   . Smokeless tobacco: Not on file  . Alcohol Use: Yes     Comment: rare  . Drug Use: Not on file  . Sexual Activity: Yes    Birth Control/ Protection: Post-menopausal   Other Topics Concern  . Not on file   Social History Narrative  . No narrative on file    Family History  Problem Relation Age of Onset  . Diabetes Mother   . Multiple sclerosis Mother   . Cancer Father     ?  [name] CAD      Father's side CAD    No Known Allergies  Current Outpatient Prescriptions  Medication Sig Dispense Refill  . Calcium Carbonate-Vitamin D (CALCIUM + D PO) Take 1 tablet by mouth 2 (two) times daily.       . carvedilol (COREG) 12.5 MG tablet Take 1 tablet (12.5 mg total) by mouth 2 (two) times daily.  60 tablet  3  . fexofenadine (ALLEGRA) 180 MG tablet Take 180 mg by mouth as needed.       [name]  furosemide (LASIX) 40 MG tablet Take 40 mg by mouth daily.       [name] lisinopril (PRINIVIL,ZESTRIL) 2.5 MG tablet Take 1 tablet (2.5 mg total) by mouth daily.  30 tablet  3  . Loratadine (CLARITIN PO) Take 1 tablet by mouth as needed.       [name] spironolactone (ALDACTONE) 25 MG tablet Take 12.5 mg by mouth daily.      . Warfarin Sodium (COUMADIN PO) Take by mouth as directed. 2.5mg  daily except M/F take 5mg        No current facility-administered medications for this visit.    ROS- all systems are reviewed and negative except as per HPI  Physical Exam: Filed Vitals:   09/19/13 0949  BP: 114/68  Pulse: 61  Height: 5' 3.25" (1.607 m)  Weight: 136 lb 1.9 oz (61.744 kg)    GEN- The patient is well appearing, alert and oriented x 3 today.   Head- normocephalic, atraumatic Eyes-  Sclera clear, conjunctiva pink Ears- hearing intact Oropharynx- clear Neck- supple, no JVP Lymph-  no cervical lymphadenopathy Lungs- Clear to ausculation bilaterally, normal work of breathing Chest- pacemaker pocket is well healed Heart- Regular rate and rhythm (paced) GI- soft, NT, ND, + BS Extremities- no clubbing, cyanosis, or edema MS- no significant deformity or atrophy Skin- no rash or lesion Psych- euthymic mood, full affect Neuro- strength and sensation are intact  Pacemaker interrogation- reviewed in detail today,  See PACEART report  Assessment and Plan:  1. Complete heart block Normal pacemaker function See Pace Art report No changes today  2. Permanent afib Continue coumadin long term  3. Nonischemic CM Echo from [facility] 2013 reviewed No changes today  TTMs Return to the device clinic in 6 months Return to see Jerene Pitch in 1 year

## 2013-09-19 NOTE — Patient Instructions (Addendum)
Your physician wants you to follow-up in: 6 device clinic and 12 months with Kristi Hutchinson, PA You will receive a reminder letter in the mail two months in advance. If you don't receive a letter, please call our office to schedule the follow-up appointment.

## 2013-09-23 ENCOUNTER — Encounter: Payer: Self-pay | Admitting: Internal Medicine

## 2013-09-25 ENCOUNTER — Other Ambulatory Visit: Payer: Self-pay | Admitting: *Deleted

## 2013-09-25 ENCOUNTER — Telehealth: Payer: Self-pay | Admitting: *Deleted

## 2013-09-25 MED ORDER — WARFARIN SODIUM 5 MG PO TABS: ORAL_TABLET | ORAL | Status: DC

## 2013-09-25 MED ORDER — SPIRONOLACTONE 25 MG PO TABS: 12.5000 mg | ORAL_TABLET | Freq: Every day | ORAL | Status: DC

## 2013-09-25 NOTE — Telephone Encounter (Signed)
[facility] pharmacy requests coumadin refill. Thanks, MI

## 2013-10-17 ENCOUNTER — Ambulatory Visit (INDEPENDENT_AMBULATORY_CARE_PROVIDER_SITE_OTHER): Payer: Medicare Other

## 2013-10-17 DIAGNOSIS — I4891 Unspecified atrial fibrillation: Secondary | ICD-10-CM

## 2013-10-17 DIAGNOSIS — Z5181 Encounter for therapeutic drug level monitoring: Secondary | ICD-10-CM

## 2013-10-17 LAB — POCT INR: INR: 2.4

## 2013-11-18 ENCOUNTER — Other Ambulatory Visit: Payer: Self-pay | Admitting: Cardiology

## 2013-11-28 ENCOUNTER — Ambulatory Visit (INDEPENDENT_AMBULATORY_CARE_PROVIDER_SITE_OTHER): Payer: Medicare Other | Admitting: *Deleted

## 2013-11-28 DIAGNOSIS — I4891 Unspecified atrial fibrillation: Secondary | ICD-10-CM

## 2013-11-28 DIAGNOSIS — Z5181 Encounter for therapeutic drug level monitoring: Secondary | ICD-10-CM

## 2013-11-28 LAB — POCT INR: INR: 2

## 2014-01-07 ENCOUNTER — Ambulatory Visit (INDEPENDENT_AMBULATORY_CARE_PROVIDER_SITE_OTHER): Payer: Medicare Other | Admitting: Pharmacist Clinician (PhC)/ Clinical Pharmacy Specialist

## 2014-01-07 DIAGNOSIS — I4891 Unspecified atrial fibrillation: Secondary | ICD-10-CM

## 2014-01-07 DIAGNOSIS — Z5181 Encounter for therapeutic drug level monitoring: Secondary | ICD-10-CM

## 2014-01-07 LAB — POCT INR: INR: 2.5

## 2014-01-07 MED ORDER — CARVEDILOL 12.5 MG PO TABS
12.5000 mg | ORAL_TABLET | Freq: Two times a day (BID) | ORAL | Status: DC
Start: 2014-01-07 — End: 2014-10-26

## 2014-01-07 MED ORDER — FUROSEMIDE 40 MG PO TABS: 40.0000 mg | ORAL_TABLET | Freq: Every day | ORAL | Status: DC

## 2014-01-07 MED ORDER — LISINOPRIL 2.5 MG PO TABS: 2.5000 mg | ORAL_TABLET | Freq: Every day | ORAL | Status: DC

## 2014-02-18 ENCOUNTER — Ambulatory Visit (INDEPENDENT_AMBULATORY_CARE_PROVIDER_SITE_OTHER): Payer: Medicare Other | Admitting: *Deleted

## 2014-02-18 DIAGNOSIS — I4891 Unspecified atrial fibrillation: Secondary | ICD-10-CM

## 2014-02-18 DIAGNOSIS — Z5181 Encounter for therapeutic drug level monitoring: Secondary | ICD-10-CM

## 2014-02-18 LAB — POCT INR: INR: 2.4

## 2014-03-03 ENCOUNTER — Other Ambulatory Visit: Payer: Self-pay | Admitting: Cardiology

## 2014-03-05 ENCOUNTER — Other Ambulatory Visit: Payer: Self-pay

## 2014-03-05 MED ORDER — SPIRONOLACTONE 25 MG PO TABS: ORAL_TABLET | ORAL | Status: DC

## 2014-04-01 ENCOUNTER — Ambulatory Visit (INDEPENDENT_AMBULATORY_CARE_PROVIDER_SITE_OTHER): Payer: Medicare Other | Admitting: Pharmacist

## 2014-04-01 DIAGNOSIS — Z5181 Encounter for therapeutic drug level monitoring: Secondary | ICD-10-CM

## 2014-04-01 DIAGNOSIS — I4891 Unspecified atrial fibrillation: Secondary | ICD-10-CM

## 2014-04-01 LAB — POCT INR: INR: 2.6

## 2014-04-08 ENCOUNTER — Encounter: Payer: Self-pay | Admitting: *Deleted

## 2014-04-09 DIAGNOSIS — I4891 Unspecified atrial fibrillation: Secondary | ICD-10-CM

## 2014-04-12 ENCOUNTER — Other Ambulatory Visit: Payer: Self-pay | Admitting: Cardiology

## 2014-04-28 ENCOUNTER — Other Ambulatory Visit: Payer: Self-pay | Admitting: *Deleted

## 2014-04-28 MED ORDER — FUROSEMIDE 40 MG PO TABS: 40.0000 mg | ORAL_TABLET | Freq: Every day | ORAL | Status: DC

## 2014-04-30 ENCOUNTER — Encounter: Payer: Self-pay | Admitting: Internal Medicine

## 2014-05-13 ENCOUNTER — Ambulatory Visit (INDEPENDENT_AMBULATORY_CARE_PROVIDER_SITE_OTHER): Payer: Medicare Other | Admitting: Pharmacist

## 2014-05-13 DIAGNOSIS — Z5181 Encounter for therapeutic drug level monitoring: Secondary | ICD-10-CM

## 2014-05-13 DIAGNOSIS — I4891 Unspecified atrial fibrillation: Secondary | ICD-10-CM

## 2014-05-13 LAB — POCT INR: INR: 2.3

## 2014-05-20 ENCOUNTER — Encounter: Payer: Self-pay | Admitting: *Deleted

## 2014-05-22 ENCOUNTER — Encounter: Payer: Self-pay | Admitting: Cardiology

## 2014-06-04 ENCOUNTER — Ambulatory Visit (INDEPENDENT_AMBULATORY_CARE_PROVIDER_SITE_OTHER): Payer: Medicare Other | Admitting: *Deleted

## 2014-06-04 DIAGNOSIS — I428 Other cardiomyopathies: Secondary | ICD-10-CM

## 2014-06-04 DIAGNOSIS — I442 Atrioventricular block, complete: Secondary | ICD-10-CM

## 2014-06-04 DIAGNOSIS — I429 Cardiomyopathy, unspecified: Secondary | ICD-10-CM

## 2014-06-04 LAB — MDC_IDC_ENUM_SESS_TYPE_INCLINIC
Battery Remaining Longevity: 21 mo
Battery Voltage: 2.91 V
Brady Statistic RV Percent Paced: 95 %
Date Time Interrogation Session: 20151021094300
Implantable Pulse Generator Model: 8042
Lead Channel Impedance Value: 0 Ohm
Lead Channel Impedance Value: 433 Ohm
Lead Channel Impedance Value: 503 Ohm
Lead Channel Pacing Threshold Amplitude: 1 V
Lead Channel Pacing Threshold Amplitude: 1 V
Lead Channel Pacing Threshold Pulse Width: 0.4 ms
Lead Channel Pacing Threshold Pulse Width: 0.6 ms
Lead Channel Setting Pacing Amplitude: 2.5 V
Lead Channel Setting Pacing Amplitude: 2.5 V
Lead Channel Setting Pacing Pulse Width: 0.4 ms
Lead Channel Setting Pacing Pulse Width: 0.6 ms
Lead Channel Setting Sensing Sensitivity: 2.8 mV

## 2014-06-04 NOTE — Progress Notes (Signed)
CRT-P device check in clinic. Normal device function. Thresholds, sensing, impedance consistent with previous measurements. Histograms appropriate for patient and level of activity. 29 ventricular high rate episodes 2-6 seconds.  A-fib, + coumadin.   Patient bi-ventricularly pacing >94.9 % of the time. Device programmed with appropriate safety margins. Device heart failure diagnostics are within normal limits and stable over time. Estimated longevity 1.5 years.   ROV in February with Dr. Rayann Heman.

## 2014-06-16 ENCOUNTER — Encounter: Payer: Self-pay | Admitting: Internal Medicine

## 2014-06-19 ENCOUNTER — Encounter: Payer: Self-pay | Admitting: Internal Medicine

## 2014-06-24 ENCOUNTER — Ambulatory Visit (INDEPENDENT_AMBULATORY_CARE_PROVIDER_SITE_OTHER): Payer: Medicare Other | Admitting: Pharmacist Clinician (PhC)/ Clinical Pharmacy Specialist

## 2014-06-24 ENCOUNTER — Encounter: Payer: Self-pay | Admitting: Internal Medicine

## 2014-06-24 DIAGNOSIS — I4891 Unspecified atrial fibrillation: Secondary | ICD-10-CM

## 2014-06-24 DIAGNOSIS — Z5181 Encounter for therapeutic drug level monitoring: Secondary | ICD-10-CM

## 2014-06-24 LAB — POCT INR: INR: 1.8

## 2014-07-08 ENCOUNTER — Other Ambulatory Visit: Payer: Self-pay | Admitting: Cardiology

## 2014-07-09 ENCOUNTER — Encounter: Payer: Self-pay | Admitting: Internal Medicine

## 2014-07-09 DIAGNOSIS — I442 Atrioventricular block, complete: Secondary | ICD-10-CM

## 2014-07-15 ENCOUNTER — Ambulatory Visit (INDEPENDENT_AMBULATORY_CARE_PROVIDER_SITE_OTHER): Payer: Medicare Other | Admitting: Pharmacist

## 2014-07-15 DIAGNOSIS — Z5181 Encounter for therapeutic drug level monitoring: Secondary | ICD-10-CM

## 2014-07-15 DIAGNOSIS — I4891 Unspecified atrial fibrillation: Secondary | ICD-10-CM

## 2014-07-15 LAB — POCT INR: INR: 1.9

## 2014-07-30 ENCOUNTER — Ambulatory Visit (INDEPENDENT_AMBULATORY_CARE_PROVIDER_SITE_OTHER): Payer: Medicare Other | Admitting: *Deleted

## 2014-07-30 ENCOUNTER — Encounter: Payer: Medicare Other | Admitting: Internal Medicine

## 2014-07-30 DIAGNOSIS — Z5181 Encounter for therapeutic drug level monitoring: Secondary | ICD-10-CM

## 2014-07-30 DIAGNOSIS — I4891 Unspecified atrial fibrillation: Secondary | ICD-10-CM

## 2014-07-30 LAB — POCT INR: INR: 2.6

## 2014-08-06 ENCOUNTER — Encounter: Payer: Medicare Other | Admitting: Internal Medicine

## 2014-08-20 ENCOUNTER — Ambulatory Visit (INDEPENDENT_AMBULATORY_CARE_PROVIDER_SITE_OTHER): Payer: Medicare Other | Admitting: Internal Medicine

## 2014-08-20 ENCOUNTER — Ambulatory Visit (INDEPENDENT_AMBULATORY_CARE_PROVIDER_SITE_OTHER): Payer: Medicare Other | Admitting: *Deleted

## 2014-08-20 ENCOUNTER — Encounter: Payer: Self-pay | Admitting: Internal Medicine

## 2014-08-20 VITALS — BP 120/74 | HR 75 | Ht 63.25 in | Wt 131.8 lb

## 2014-08-20 DIAGNOSIS — I429 Cardiomyopathy, unspecified: Secondary | ICD-10-CM

## 2014-08-20 DIAGNOSIS — I442 Atrioventricular block, complete: Secondary | ICD-10-CM

## 2014-08-20 DIAGNOSIS — I4891 Unspecified atrial fibrillation: Secondary | ICD-10-CM

## 2014-08-20 DIAGNOSIS — I428 Other cardiomyopathies: Secondary | ICD-10-CM

## 2014-08-20 DIAGNOSIS — Z950 Presence of cardiac pacemaker: Secondary | ICD-10-CM

## 2014-08-20 DIAGNOSIS — Z5181 Encounter for therapeutic drug level monitoring: Secondary | ICD-10-CM

## 2014-08-20 LAB — MDC_IDC_ENUM_SESS_TYPE_INCLINIC
Battery Remaining Longevity: 18 mo
Battery Voltage: 2.91 V
Brady Statistic RV Percent Paced: 96 %
Date Time Interrogation Session: 20160106123025
Implantable Pulse Generator Model: 8042
Lead Channel Impedance Value: 0 Ohm
Lead Channel Impedance Value: 472 Ohm
Lead Channel Impedance Value: 543 Ohm
Lead Channel Pacing Threshold Amplitude: 1 V
Lead Channel Pacing Threshold Amplitude: 1 V
Lead Channel Pacing Threshold Pulse Width: 0.4 ms
Lead Channel Pacing Threshold Pulse Width: 0.6 ms
Lead Channel Setting Pacing Amplitude: 2.5 V
Lead Channel Setting Pacing Amplitude: 2.5 V
Lead Channel Setting Pacing Pulse Width: 0.4 ms
Lead Channel Setting Pacing Pulse Width: 0.6 ms
Lead Channel Setting Sensing Sensitivity: 2.8 mV

## 2014-08-20 LAB — POCT INR: INR: 2

## 2014-08-20 NOTE — Patient Instructions (Signed)
Your physician has requested that you have an echocardiogram. Echocardiography is a painless test that uses sound waves to create images of your heart. It provides your doctor with information about the size and shape of your heart and how well your heart's chambers and valves are working. This procedure takes approximately one hour. There are no restrictions for this procedure.  After I get the results from echo will call you and set up for a generator change.  Will hold your Coumadin 2 days prior to the procedure  Will have a follow up in 7-10 days after procedure

## 2014-08-21 NOTE — Progress Notes (Signed)
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 female with a h/o complete heart block and nonischemic CM sp PPM with subsequent upgrade (MDT) by Dr Leonia Reeves who presents today for follow-up in the Electrophysiology device clinic.  She is doing reasonably well.  She has chronic SOB as well as occasional postural dizziness.  She has a MDT [phone] CRT-P device which has recently been placed under a recall.  She received her advisory letter from our practice.  Today, she  denies symptoms of palpitations, chest pain, orthopnea, PND, lower extremity edema, dizziness, presyncope, syncope, or neurologic sequela.  The patientis tolerating medications without difficulties and is otherwise without complaint today.   Past Medical History  Diagnosis Date  . Fibroid   . Permanent atrial fibrillation   . Multiple sclerosis 1984  . Pulmonary hypertension, secondary 06/04/2013    As a result of nonischemic cardiomyopathy EF 25-30%  . Nonischemic cardiomyopathy     Moderate LVEF 35-40% by ECHO 2011, 25-30% by echo 2013  . Mitral regurgitation     moderate  . Chronic systolic heart failure   . Cardiac pacemaker in situ 06/2008    CRT  . DDD (degenerative disc disease), lumbar   . DDD (degenerative disc disease), cervical   . GERD (gastroesophageal reflux disease)   . Hearing loss 02/2010    L hearing loss/vertigo, steroids  . Adenomatous colon polyp   . Atrial flutter     A fib/flutter DCCV x 2   (05/2008 & 12/2009)  . CKD (chronic kidney disease), stage III   . Stroke     Right brain CVA, complete recovery 07/2003  . History of seizure disorder     Related Hx  . Cervical radiculopathy at C5   . Vertigo 02/2010    L hearing loss/vertigo, steroids  . Left atrial enlargement     severe   Past Surgical History  Procedure Laterality Date  . Tubal ligation    . Hysteroscopy    . Dilation and curettage of uterus    . Pacemaker insertion      PTVP 08/2001  for complete heart block. Upgrade PTVP to MDT BiV 06/2008 by Dr Leonia Reeves    History   Social History  . Marital Status: Married    Spouse Name: N/A    Number of Children: N/A  . Years of Education: N/A   Occupational History  . Not on file.   Social History Main Topics  . Smoking status: Never Smoker   . Smokeless tobacco: Not on file  . Alcohol Use: Yes     Comment: rare  . Drug Use: Not on file  . Sexual Activity: Yes    Birth Control/ Protection: Post-menopausal   Other Topics Concern  . Not on file   Social History Narrative    Family History  Problem Relation Age of Onset  . Diabetes Mother   . Multiple sclerosis Mother   . Cancer Father     ?  [name] CAD      Father's side CAD    No Known Allergies  Current Outpatient Prescriptions  Medication Sig Dispense Refill  . BIOTIN PO Take 1 capsule by mouth daily.    . Calcium Carbonate-Vitamin D (CALCIUM + D PO) Take 1 tablet by mouth 2 (two) times daily.     . carvedilol (COREG) 12.5 MG tablet Take 1 tablet (12.5 mg total) by mouth 2 (two) times daily. [place]  tablet 1  . COUMADIN 5 MG tablet Take as directed by Coumadin Clinic 30 tablet 3  . fexofenadine (ALLEGRA) 180 MG tablet Take 180 mg by mouth daily as needed for allergies.     . furosemide (LASIX) 40 MG tablet Take 1 tablet by mouth  daily 90 tablet 0  . lisinopril (PRINIVIL,ZESTRIL) 2.5 MG tablet Take 1 tablet (2.5 mg total) by mouth daily. 90 tablet 1  . Loratadine (CLARITIN PO) Take 1 tablet by mouth daily as needed (allergies).     [name] spironolactone (ALDACTONE) 25 MG tablet TAKE 1/2 TABLET BY MOUTH EVERY DAY. 45 tablet 3   No current facility-administered medications for this visit.    ROS- all systems are reviewed and negative except as per HPI  Physical Exam: Filed Vitals:   08/20/14 1200  BP: 120/74  Pulse: 75  Height: 5' 3.25" (1.607 m)  Weight: 131 lb 12.8 oz (59.784 kg)    GEN- The patient is well appearing, alert and oriented x 3 today.     Head- normocephalic, atraumatic Eyes-  Sclera clear, conjunctiva pink Ears- hearing intact Oropharynx- clear Neck- supple, no JVP Lymph- no cervical lymphadenopathy Lungs- Clear to ausculation bilaterally, normal work of breathing Chest- pacemaker pocket is well healed Heart- Regular rate and rhythm (paced) GI- soft, NT, ND, + BS Extremities- no clubbing, cyanosis, or edema MS- no significant deformity or atrophy Skin- no rash or lesion Psych- euthymic mood, full affect Neuro- strength and sensation are intact  Pacemaker interrogation- reviewed in detail today,  See PACEART report  Assessment and Plan:  1. Complete heart block Normal pacemaker function See Pace Art report No changes today Her pacemaker is under recall due to reported battery failures.  As she is device dependant, she is at increased risks should her device fail.  I have had a long discussion with the patient regarding this.  I have also spoken with our [employer] team who feels that device gen change at this time is indicated. Risks, benefits, and alternatives to CRT-P gen change were discussed at length with the patient who wishes to proceed. We will schedule the procedure at the next available time.  2. Permanent afib Continue coumadin long term.  Hold coumadin for 2 days prior to gen change.  3. Nonischemic CM Echo from [facility] 2013 reviewed Repeat echo at this time

## 2014-08-28 ENCOUNTER — Other Ambulatory Visit: Payer: Self-pay

## 2014-08-28 ENCOUNTER — Ambulatory Visit (HOSPITAL_COMMUNITY): Payer: Medicare Other | Attending: Cardiovascular Disease | Admitting: Radiology

## 2014-08-28 DIAGNOSIS — I4891 Unspecified atrial fibrillation: Secondary | ICD-10-CM | POA: Insufficient documentation

## 2014-08-28 NOTE — Progress Notes (Signed)
Echocardiogram performed.  

## 2014-09-02 ENCOUNTER — Encounter: Payer: Self-pay | Admitting: Internal Medicine

## 2014-09-03 ENCOUNTER — Other Ambulatory Visit: Payer: Self-pay

## 2014-09-03 ENCOUNTER — Other Ambulatory Visit: Payer: Self-pay | Admitting: *Deleted

## 2014-09-03 DIAGNOSIS — I4891 Unspecified atrial fibrillation: Secondary | ICD-10-CM

## 2014-09-03 DIAGNOSIS — I429 Cardiomyopathy, unspecified: Secondary | ICD-10-CM

## 2014-09-04 ENCOUNTER — Encounter: Payer: Self-pay | Admitting: Internal Medicine

## 2014-09-09 ENCOUNTER — Other Ambulatory Visit (INDEPENDENT_AMBULATORY_CARE_PROVIDER_SITE_OTHER): Payer: Medicare Other | Admitting: *Deleted

## 2014-09-09 DIAGNOSIS — I4891 Unspecified atrial fibrillation: Secondary | ICD-10-CM

## 2014-09-09 LAB — CBC WITH DIFFERENTIAL/PLATELET
Basophils Absolute: 0 10*3/uL (ref 0.0–0.1)
Basophils Relative: 0.4 % (ref 0.0–3.0)
Eosinophils Absolute: 0.1 10*3/uL (ref 0.0–0.7)
Eosinophils Relative: 1.6 % (ref 0.0–5.0)
HCT: 34.7 % — ABNORMAL LOW (ref 36.0–46.0)
Hemoglobin: 11.9 g/dL — ABNORMAL LOW (ref 12.0–15.0)
Lymphocytes Relative: 29.8 % (ref 12.0–46.0)
Lymphs Abs: 1.2 10*3/uL (ref 0.7–4.0)
MCHC: 34.3 g/dL (ref 30.0–36.0)
MCV: 90.4 fl (ref 78.0–100.0)
Monocytes Absolute: 0.4 10*3/uL (ref 0.1–1.0)
Monocytes Relative: 9.2 % (ref 3.0–12.0)
Neutro Abs: 2.4 10*3/uL (ref 1.4–7.7)
Neutrophils Relative %: 59 % (ref 43.0–77.0)
Platelets: 117 10*3/uL — ABNORMAL LOW (ref 150.0–400.0)
RBC: 3.84 Mil/uL — ABNORMAL LOW (ref 3.87–5.11)
RDW: 14 % (ref 11.5–15.5)
WBC: 4.1 10*3/uL (ref 4.0–10.5)

## 2014-09-09 LAB — PROTIME-INR
INR: 2.7 ratio — ABNORMAL HIGH (ref 0.8–1.0)
Prothrombin Time: 29.4 s — ABNORMAL HIGH (ref 9.6–13.1)

## 2014-09-09 LAB — BASIC METABOLIC PANEL
BUN: 11 mg/dL (ref 6–23)
CO2: 28 mEq/L (ref 19–32)
Calcium: 9.5 mg/dL (ref 8.4–10.5)
Chloride: 103 mEq/L (ref 96–112)
Creatinine, Ser: 1.06 mg/dL (ref 0.40–1.20)
GFR: 67.35 mL/min (ref >60.00)
Glucose, Bld: 71 mg/dL (ref 70–99)
Potassium: 3.8 mEq/L (ref 3.5–5.1)
Sodium: 137 mEq/L (ref 135–145)

## 2014-09-12 ENCOUNTER — Telehealth: Payer: Self-pay | Admitting: Internal Medicine

## 2014-09-12 NOTE — Telephone Encounter (Signed)
New message     Pt is having a pacemaker procedure on 09-18-14.  She lost her paper work.  Please call her and tell her what to do

## 2014-09-12 NOTE — Telephone Encounter (Signed)
Sent via mychart

## 2014-09-16 ENCOUNTER — Encounter (HOSPITAL_COMMUNITY): Payer: Self-pay | Admitting: Pharmacy Technician

## 2014-09-17 MED ORDER — SODIUM CHLORIDE 0.9 % IR SOLN
80.0000 mg | Status: DC
  Filled 2014-09-17 (×2): qty 2

## 2014-09-17 MED ORDER — MUPIROCIN 2 % EX OINT
TOPICAL_OINTMENT | Freq: Two times a day (BID) | CUTANEOUS | Status: DC
  Administered 2014-09-18: 09:00:00 via NASAL
  Filled 2014-09-17: qty 22

## 2014-09-17 MED ORDER — SODIUM CHLORIDE 0.9 % IV SOLN
INTRAVENOUS | Status: DC
  Administered 2014-09-18: 09:00:00 via INTRAVENOUS

## 2014-09-17 MED ORDER — VANCOMYCIN HCL IN DEXTROSE 1-5 GM/200ML-% IV SOLN
1000.0000 mg | INTRAVENOUS | Status: DC
  Filled 2014-09-17: qty 200

## 2014-09-18 ENCOUNTER — Ambulatory Visit (HOSPITAL_COMMUNITY)
Admission: RE | Admit: 2014-09-18 | Discharge: 2014-09-18 | Disposition: A | Discharge status: Home / Self Care | Payer: Medicare Other | Source: Ambulatory Visit | Attending: Internal Medicine | Admitting: Internal Medicine

## 2014-09-18 ENCOUNTER — Encounter (HOSPITAL_COMMUNITY)
Admission: RE | Disposition: A | Discharge status: Home / Self Care | Payer: Self-pay | Source: Ambulatory Visit | Attending: Internal Medicine

## 2014-09-18 ENCOUNTER — Encounter (HOSPITAL_COMMUNITY): Payer: Self-pay | Admitting: Internal Medicine

## 2014-09-18 DIAGNOSIS — I482 Chronic atrial fibrillation: Secondary | ICD-10-CM | POA: Diagnosis not present

## 2014-09-18 DIAGNOSIS — I5022 Chronic systolic (congestive) heart failure: Secondary | ICD-10-CM | POA: Diagnosis not present

## 2014-09-18 DIAGNOSIS — M5030 Other cervical disc degeneration, unspecified cervical region: Secondary | ICD-10-CM | POA: Insufficient documentation

## 2014-09-18 DIAGNOSIS — G40909 Epilepsy, unspecified, not intractable, without status epilepticus: Secondary | ICD-10-CM | POA: Diagnosis not present

## 2014-09-18 DIAGNOSIS — Z7901 Long term (current) use of anticoagulants: Secondary | ICD-10-CM | POA: Diagnosis not present

## 2014-09-18 DIAGNOSIS — Z79899 Other long term (current) drug therapy: Secondary | ICD-10-CM | POA: Diagnosis not present

## 2014-09-18 DIAGNOSIS — I509 Heart failure, unspecified: Secondary | ICD-10-CM | POA: Diagnosis not present

## 2014-09-18 DIAGNOSIS — M5136 Other intervertebral disc degeneration, lumbar region: Secondary | ICD-10-CM | POA: Insufficient documentation

## 2014-09-18 DIAGNOSIS — Z8673 Personal history of transient ischemic attack (TIA), and cerebral infarction without residual deficits: Secondary | ICD-10-CM | POA: Insufficient documentation

## 2014-09-18 DIAGNOSIS — I4891 Unspecified atrial fibrillation: Secondary | ICD-10-CM | POA: Diagnosis present

## 2014-09-18 DIAGNOSIS — I428 Other cardiomyopathies: Secondary | ICD-10-CM

## 2014-09-18 DIAGNOSIS — K219 Gastro-esophageal reflux disease without esophagitis: Secondary | ICD-10-CM | POA: Diagnosis not present

## 2014-09-18 DIAGNOSIS — H9190 Unspecified hearing loss, unspecified ear: Secondary | ICD-10-CM | POA: Insufficient documentation

## 2014-09-18 DIAGNOSIS — I442 Atrioventricular block, complete: Secondary | ICD-10-CM | POA: Insufficient documentation

## 2014-09-18 DIAGNOSIS — I429 Cardiomyopathy, unspecified: Secondary | ICD-10-CM | POA: Insufficient documentation

## 2014-09-18 DIAGNOSIS — N183 Chronic kidney disease, stage 3 (moderate): Secondary | ICD-10-CM | POA: Diagnosis not present

## 2014-09-18 DIAGNOSIS — I4892 Unspecified atrial flutter: Secondary | ICD-10-CM | POA: Diagnosis present

## 2014-09-18 HISTORY — PX: BIV PACEMAKER GENERATOR CHANGE OUT: SHX5746

## 2014-09-18 LAB — PROTIME-INR
INR: 2.04 — ABNORMAL HIGH (ref 0.00–1.49)
Prothrombin Time: 23.2 seconds — ABNORMAL HIGH (ref 11.6–15.2)

## 2014-09-18 LAB — SURGICAL PCR SCREEN
MRSA, PCR: NEGATIVE
Staphylococcus aureus: NEGATIVE

## 2014-09-18 SURGERY — BIV PACEMAKER GENERATOR CHANGE OUT

## 2014-09-18 MED ORDER — ONDANSETRON HCL 4 MG/2ML IJ SOLN: 4.0000 mg | Freq: Four times a day (QID) | INTRAMUSCULAR | Status: DC | PRN

## 2014-09-18 MED ORDER — FENTANYL CITRATE 0.05 MG/ML IJ SOLN
INTRAMUSCULAR | Status: AC
  Filled 2014-09-18: qty 2

## 2014-09-18 MED ORDER — SODIUM CHLORIDE 0.9 % IJ SOLN: 3.0000 mL | Freq: Two times a day (BID) | INTRAMUSCULAR | Status: DC

## 2014-09-18 MED ORDER — LIDOCAINE HCL (PF) 1 % IJ SOLN
INTRAMUSCULAR | Status: AC
  Filled 2014-09-18: qty 60

## 2014-09-18 MED ORDER — MUPIROCIN 2 % EX OINT
TOPICAL_OINTMENT | CUTANEOUS | Status: AC
  Filled 2014-09-18: qty 22

## 2014-09-18 MED ORDER — SODIUM CHLORIDE 0.9 % IJ SOLN: 3.0000 mL | INTRAMUSCULAR | Status: DC | PRN

## 2014-09-18 MED ORDER — CHLORHEXIDINE GLUCONATE 4 % EX LIQD
60.0000 mL | Freq: Once | CUTANEOUS | Status: DC
  Filled 2014-09-18: qty 60

## 2014-09-18 MED ORDER — ACETAMINOPHEN 325 MG PO TABS
325.0000 mg | ORAL_TABLET | ORAL | Status: DC | PRN
  Filled 2014-09-18: qty 2

## 2014-09-18 MED ORDER — SODIUM CHLORIDE 0.9 % IV SOLN: 250.0000 mL | INTRAVENOUS | Status: DC | PRN

## 2014-09-18 MED ORDER — MIDAZOLAM HCL 5 MG/5ML IJ SOLN
INTRAMUSCULAR | Status: AC
  Filled 2014-09-18: qty 5

## 2014-09-18 NOTE — Discharge Instructions (Signed)
Pacemaker Battery Change, Care After °Refer to this sheet in the next few weeks. These instructions provide you with information on caring for yourself after your procedure. Your health care provider may also give you more specific instructions. Your treatment has been planned according to current medical practices, but problems sometimes occur. Call your health care provider if you have any problems or questions after your procedure. °WHAT TO EXPECT AFTER THE PROCEDURE °After your procedure, it is typical to have the following sensations: °· Soreness at the pacemaker site. °HOME CARE INSTRUCTIONS  °· Keep the incision clean and dry. °· Unless advised otherwise, you may shower beginning 48 hours after your procedure. °· For the first week after the replacement, avoid stretching motions that pull at the incision site, and avoid heavy exercise with the arm that is on the same side as the incision. °· Take medicines only as directed by your health care provider. °· Keep all follow-up visits as directed by your health care provider. °SEEK MEDICAL CARE IF:  °· You have pain at the incision site that is not relieved by over-the-counter or prescription medicine. °· There is drainage or pus from the incision site. °· There is swelling larger than a lime at the incision site. °· You develop red streaking that extends above or below the incision site. °· You feel brief, intermittent palpitations, light-headedness, or any symptoms that you feel might be related to your heart. °SEEK IMMEDIATE MEDICAL CARE IF:  °· You experience chest pain that is different than the pain at the pacemaker site. °· You experience shortness of breath. °· You have palpitations or irregular heartbeat. °· You have light-headedness that does not go away quickly. °· You faint. °· You have pain that gets worse and is not relieved by medicine. °Document Released: 05/22/2013 Document Revised: 12/16/2013 Document Reviewed: 05/22/2013 °ExitCare® Patient  Information ©2015 [employer]. This information is not intended to replace advice given to you by your health care provider. Make sure you discuss any questions you have with your health care provider. ° °

## 2014-09-18 NOTE — H&P (View-Only) (Signed)
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 female with a h/o complete heart block and nonischemic CM sp PPM with subsequent upgrade (MDT) by Dr Leonia Reeves who presents today for follow-up in the Electrophysiology device clinic.  She is doing reasonably well.  She has chronic SOB as well as occasional postural dizziness.  She has a MDT [phone] CRT-P device which has recently been placed under a recall.  She received her advisory letter from our practice.  Today, she  denies symptoms of palpitations, chest pain, orthopnea, PND, lower extremity edema, dizziness, presyncope, syncope, or neurologic sequela.  The patientis tolerating medications without difficulties and is otherwise without complaint today.   Past Medical History  Diagnosis Date  . Fibroid   . Permanent atrial fibrillation   . Multiple sclerosis 1984  . Pulmonary hypertension, secondary 06/04/2013    As a result of nonischemic cardiomyopathy EF 25-30%  . Nonischemic cardiomyopathy     Moderate LVEF 35-40% by ECHO 2011, 25-30% by echo 2013  . Mitral regurgitation     moderate  . Chronic systolic heart failure   . Cardiac pacemaker in situ 06/2008    CRT  . DDD (degenerative disc disease), lumbar   . DDD (degenerative disc disease), cervical   . GERD (gastroesophageal reflux disease)   . Hearing loss 02/2010    L hearing loss/vertigo, steroids  . Adenomatous colon polyp   . Atrial flutter     A fib/flutter DCCV x 2   (05/2008 & 12/2009)  . CKD (chronic kidney disease), stage III   . Stroke     Right brain CVA, complete recovery 07/2003  . History of seizure disorder     Related Hx  . Cervical radiculopathy at C5   . Vertigo 02/2010    L hearing loss/vertigo, steroids  . Left atrial enlargement     severe   Past Surgical History  Procedure Laterality Date  . Tubal ligation    . Hysteroscopy    . Dilation and curettage of uterus    . Pacemaker insertion      PTVP 08/2001  for complete heart block. Upgrade PTVP to MDT BiV 06/2008 by Dr Leonia Reeves    History   Social History  . Marital Status: Married    Spouse Name: N/A    Number of Children: N/A  . Years of Education: N/A   Occupational History  . Not on file.   Social History Main Topics  . Smoking status: Never Smoker   . Smokeless tobacco: Not on file  . Alcohol Use: Yes     Comment: rare  . Drug Use: Not on file  . Sexual Activity: Yes    Birth Control/ Protection: Post-menopausal   Other Topics Concern  . Not on file   Social History Narrative    Family History  Problem Relation Age of Onset  . Diabetes Mother   . Multiple sclerosis Mother   . Cancer Father     ?  [name] CAD      Father's side CAD    No Known Allergies  Current Outpatient Prescriptions  Medication Sig Dispense Refill  . BIOTIN PO Take 1 capsule by mouth daily.    . Calcium Carbonate-Vitamin D (CALCIUM + D PO) Take 1 tablet by mouth 2 (two) times daily.     . carvedilol (COREG) 12.5 MG tablet Take 1 tablet (12.5 mg total) by mouth 2 (two) times daily. [place]  tablet 1  . COUMADIN 5 MG tablet Take as directed by Coumadin Clinic 30 tablet 3  . fexofenadine (ALLEGRA) 180 MG tablet Take 180 mg by mouth daily as needed for allergies.     . furosemide (LASIX) 40 MG tablet Take 1 tablet by mouth  daily 90 tablet 0  . lisinopril (PRINIVIL,ZESTRIL) 2.5 MG tablet Take 1 tablet (2.5 mg total) by mouth daily. 90 tablet 1  . Loratadine (CLARITIN PO) Take 1 tablet by mouth daily as needed (allergies).     [name] spironolactone (ALDACTONE) 25 MG tablet TAKE 1/2 TABLET BY MOUTH EVERY DAY. 45 tablet 3   No current facility-administered medications for this visit.    ROS- all systems are reviewed and negative except as per HPI  Physical Exam: Filed Vitals:   08/20/14 1200  BP: 120/74  Pulse: 75  Height: 5' 3.25" (1.607 m)  Weight: 131 lb 12.8 oz (59.784 kg)    GEN- The patient is well appearing, alert and oriented x 3 today.     Head- normocephalic, atraumatic Eyes-  Sclera clear, conjunctiva pink Ears- hearing intact Oropharynx- clear Neck- supple, no JVP Lymph- no cervical lymphadenopathy Lungs- Clear to ausculation bilaterally, normal work of breathing Chest- pacemaker pocket is well healed Heart- Regular rate and rhythm (paced) GI- soft, NT, ND, + BS Extremities- no clubbing, cyanosis, or edema MS- no significant deformity or atrophy Skin- no rash or lesion Psych- euthymic mood, full affect Neuro- strength and sensation are intact  Pacemaker interrogation- reviewed in detail today,  See PACEART report  Assessment and Plan:  1. Complete heart block Normal pacemaker function See Pace Art report No changes today Her pacemaker is under recall due to reported battery failures.  As she is device dependant, she is at increased risks should her device fail.  I have had a long discussion with the patient regarding this.  I have also spoken with our [employer] team who feels that device gen change at this time is indicated. Risks, benefits, and alternatives to CRT-P gen change were discussed at length with the patient who wishes to proceed. We will schedule the procedure at the next available time.  2. Permanent afib Continue coumadin long term.  Hold coumadin for 2 days prior to gen change.  3. Nonischemic CM Echo from [facility] 2013 reviewed Repeat echo at this time

## 2014-09-18 NOTE — Interval H&P Note (Signed)
History and Physical Interval Note:  09/18/2014 10:05 AM  Kristi Parker  has presented today for surgery, with the diagnosis of battery recall/afib  The various methods of treatment have been discussed with the patient and family. After consideration of risks, benefits and other options for treatment, the patient has consented to  Procedure(s): BIV PACEMAKER GENERATOR CHANGE OUT (N/A) as a surgical intervention .  The patient's history has been reviewed, patient examined, no change in status, stable for surgery.  I have reviewed the patient's chart and labs.  Questions were answered to the patient's satisfaction.     Thompson Grayer

## 2014-09-18 NOTE — Op Note (Signed)
SURGEON:  Thompson Grayer, MD      PREPROCEDURE DIAGNOSES:   1. nonischemic cardiomyopathy.   2. New York Heart Association class II, heart failure chronically.   3. Complete heart block  4. Permanent atrial fibrillation      POSTPROCEDURE DIAGNOSES:   1. nonischemic cardiomyopathy.   2. New York Heart Association class II, heart failure chronically.   3. Complete heart block  4. Permanent atrial fibrillation   PROCEDURES:    1.  Biventricular pacemaker pulse generator replacement   2.  Skin pocket revision     INTRODUCTION:  Kristi Parker is a 64 y.o. female a nonischemic CM,  NYHA Class II CHF, complete heart block, and prior BiV pacemaker implantation who presents today for ICD pulse generator replacement.  She has a Medtronic [id number] biventricular pacemaker which is under advisory for abrupt and premature battery failure.  As she is device dependant, recommendation is for pacemaker pulse generator replacement at this time.       DESCRIPTION OF PROCEDURE:  Informed written consent was obtained and the patient was brought to the electrophysiology lab in the fasting state.  The patient required no sedation for the procedure today.  The patient's left chest was prepped and draped in the usual sterile fashion by the EP lab staff.  The skin overlying the left deltopectoral region was infiltrated with lidocaine for local analgesia.  A 4-cm incision was made over the existing pacemaker pocket.  Electrocautery was used to assure hemostasis.  The device was exposed and removed from the pocket. A single silk stitch was identified and removed which previously secured the device within the pocket. The device was disconnected from the leads.  The leads were examined thoroughly and their integrity confirmed to be intact.   The right atrial lead was confirmed to be a Medtronic model [id number]  (serial # [id number]) lead implanted 06/18/2008.  The right ventricular lead was confirmed to be a [employer] (serial number [id number]) right ventricular defibrillator lead implanted on 08/21/2001. The left ventricular lead was confirmed to be a Medtronic model [id number] (serial number [id number]) left ventricular lead  implanted on 06/18/2008.   Atrial lead fib-waves measured 1.5 mV with an impedance of 323 ohms.  The right ventricular lead R-wave measured 4.1 mV (paced) with impedance of 358 ohms and a threshold of 0.7 volts at 0.5 milliseconds.  Left ventricular lead impedance was 915 Ohms with a threshold of 0.4V @0 .5 sec.  All three leads were then connected to a Medtronic [place] model [id number] (SN [id number]) BiV pacemaker. The pocket was revised to accomodate this new device.   The pocket was  irrigated with copious gentamicin solution.  The pacemaker was placed into the  Pocket.  The pocket was then closed in 2 layers with 2.0 Vicryl suture  for the subcutaneous and subcuticular layers. EBL<36ml. Steri-Strips and a  sterile dressing were then applied.  There were no early apparent complications.     CONCLUSIONS:   1. nonischemic cardiomyopathy with chronic New York Heart Association class II heart failure, complete heart block, and good response to CRT previously.  Her existing MDT model 8042B  Insync (SN [id number]) implanted 06/18/2008 is replaced today due to an advisory related to abrupt and premature battery failure  2. Successful biventricular pacemaker generator change  3. No early apparent complications.   Kristi Rinks [name],MD 09/18/2014 11:25 AM   Due to advisory for possible premature battery depletion related to her Medtronic 8042 biventricular pacemaker, there  may be a warranty. This will be further addressed between [employer] and [facility] Cath lab administration.

## 2014-09-19 ENCOUNTER — Encounter: Payer: Self-pay | Admitting: Internal Medicine

## 2014-09-23 NOTE — Telephone Encounter (Signed)
Called pt. I informed her that it would be ok for her to take off the tape, but make sure to leave the steri strips in place until her wound check appt. Patient voiced understanding. She asked if she would be able to take a shower. I told her that a shower is fine, but to just keep her back toward the shower to avoid drenching of the wound. Patient again voiced understanding.

## 2014-09-29 ENCOUNTER — Encounter: Payer: Medicare Other | Admitting: Internal Medicine

## 2014-10-01 ENCOUNTER — Ambulatory Visit (INDEPENDENT_AMBULATORY_CARE_PROVIDER_SITE_OTHER): Payer: Medicare Other | Admitting: *Deleted

## 2014-10-01 DIAGNOSIS — I442 Atrioventricular block, complete: Secondary | ICD-10-CM

## 2014-10-01 LAB — MDC_IDC_ENUM_SESS_TYPE_INCLINIC
Battery Voltage: 3.11 V
Brady Statistic AP VP Percent: 0 %
Brady Statistic AP VS Percent: 0 %
Brady Statistic AS VP Percent: 96.08 %
Brady Statistic AS VS Percent: 3.92 %
Brady Statistic RA Percent Paced: 0 %
Brady Statistic RV Percent Paced: 96.08 %
Date Time Interrogation Session: 20160217101243
Lead Channel Impedance Value: 304 Ohm
Lead Channel Impedance Value: 304 Ohm
Lead Channel Impedance Value: 380 Ohm
Lead Channel Impedance Value: 380 Ohm
Lead Channel Impedance Value: 532 Ohm
Lead Channel Impedance Value: 551 Ohm
Lead Channel Impedance Value: 627 Ohm
Lead Channel Impedance Value: 646 Ohm
Lead Channel Impedance Value: 969 Ohm
Lead Channel Pacing Threshold Amplitude: 0.75 V
Lead Channel Pacing Threshold Amplitude: 0.75 V
Lead Channel Pacing Threshold Pulse Width: 0.4 ms
Lead Channel Pacing Threshold Pulse Width: 0.6 ms
Lead Channel Sensing Intrinsic Amplitude: 2.125 mV
Lead Channel Setting Pacing Amplitude: 2.5 V
Lead Channel Setting Pacing Amplitude: 2.5 V
Lead Channel Setting Pacing Pulse Width: 0.4 ms
Lead Channel Setting Pacing Pulse Width: 0.6 ms
Lead Channel Setting Sensing Sensitivity: 5.6 mV
Zone Setting Detection Interval: 350 ms
Zone Setting Detection Interval: 400 ms

## 2014-10-01 NOTE — Progress Notes (Signed)
Wound check appointment. Steri-strips removed. Wound without redness or edema. Incision edges approximated, wound well healed. Normal device function. Thresholds, sensing, and impedances consistent with implant measurements. Device programmed at 3.5V/auto capture programmed on for extra safety margin until 3 month visit. Histogram distribution appropriate for patient and level of activity. Pt in AF 93.5% of time. Longest episode was 4 days. + Coumadin. No high ventricular rates noted. Patient educated about wound care, arm mobility, lifting restrictions. Pt instructed to send transmission with Carelink Smart monitor. ROV in 3 months with JA.

## 2014-10-09 ENCOUNTER — Encounter: Payer: Self-pay | Admitting: Internal Medicine

## 2014-10-10 ENCOUNTER — Other Ambulatory Visit: Payer: Self-pay | Admitting: Cardiology

## 2014-10-16 ENCOUNTER — Ambulatory Visit (INDEPENDENT_AMBULATORY_CARE_PROVIDER_SITE_OTHER): Payer: Medicare Other | Admitting: *Deleted

## 2014-10-16 ENCOUNTER — Other Ambulatory Visit: Payer: Self-pay | Admitting: Internal Medicine

## 2014-10-16 ENCOUNTER — Other Ambulatory Visit (HOSPITAL_COMMUNITY)
Admission: RE | Admit: 2014-10-16 | Discharge: 2014-10-16 | Disposition: A | Discharge status: Home / Self Care | Payer: Medicare Other | Source: Ambulatory Visit | Attending: Internal Medicine | Admitting: Internal Medicine

## 2014-10-16 DIAGNOSIS — Z124 Encounter for screening for malignant neoplasm of cervix: Secondary | ICD-10-CM | POA: Diagnosis present

## 2014-10-16 DIAGNOSIS — Z5181 Encounter for therapeutic drug level monitoring: Secondary | ICD-10-CM

## 2014-10-16 DIAGNOSIS — I4891 Unspecified atrial fibrillation: Secondary | ICD-10-CM

## 2014-10-16 LAB — POCT INR: INR: 2.3

## 2014-10-16 MED ORDER — COUMADIN 5 MG PO TABS: ORAL_TABLET | ORAL | Status: DC

## 2014-10-20 ENCOUNTER — Telehealth: Payer: Self-pay | Admitting: Internal Medicine

## 2014-10-20 LAB — CYTOLOGY - PAP

## 2014-10-20 NOTE — Telephone Encounter (Signed)
Returned call to pt, she forgot to pick up Coumadin refill, pharmacy closed on the weekend, pt was out of Coumadin and missed her Saturday and Sunday's dosage of Coumadin.  Advised pt to take an extra 1/2 tablet today and tomorrow, then resume previous dosage regimen.  Pt verbalized understanding.

## 2014-10-20 NOTE — Telephone Encounter (Signed)
New message      Pt missed 1/2 dosage of coumadin on sat and 1/2 dosage on Sunday.  Should she change her dosage?  She was out of medication and could not get it filled .  Please call

## 2014-10-24 ENCOUNTER — Telehealth: Payer: Self-pay | Admitting: Internal Medicine

## 2014-10-24 NOTE — Telephone Encounter (Signed)
New Msg       Pt has questions medtronic I.D.    Please return call.

## 2014-10-24 NOTE — Telephone Encounter (Signed)
LMOVM for return call/kwm

## 2014-10-26 ENCOUNTER — Other Ambulatory Visit: Payer: Self-pay | Admitting: Cardiology

## 2014-10-27 ENCOUNTER — Other Ambulatory Visit: Payer: Self-pay | Admitting: *Deleted

## 2014-10-27 MED ORDER — FUROSEMIDE 40 MG PO TABS: ORAL_TABLET | ORAL | Status: DC

## 2014-10-27 NOTE — Telephone Encounter (Signed)
Spoke w/pt and asnwered questions about lead and serial numbers.

## 2014-11-13 ENCOUNTER — Ambulatory Visit (INDEPENDENT_AMBULATORY_CARE_PROVIDER_SITE_OTHER): Payer: Medicare Other | Admitting: Surgery

## 2014-11-13 DIAGNOSIS — I4891 Unspecified atrial fibrillation: Secondary | ICD-10-CM | POA: Diagnosis not present

## 2014-11-13 DIAGNOSIS — Z5181 Encounter for therapeutic drug level monitoring: Secondary | ICD-10-CM | POA: Diagnosis not present

## 2014-11-13 LAB — POCT INR: INR: 3.1

## 2014-12-11 ENCOUNTER — Ambulatory Visit (INDEPENDENT_AMBULATORY_CARE_PROVIDER_SITE_OTHER): Payer: Medicare Other | Admitting: *Deleted

## 2014-12-11 DIAGNOSIS — Z5181 Encounter for therapeutic drug level monitoring: Secondary | ICD-10-CM

## 2014-12-11 DIAGNOSIS — I4891 Unspecified atrial fibrillation: Secondary | ICD-10-CM

## 2014-12-11 LAB — POCT INR: INR: 2.4

## 2014-12-17 ENCOUNTER — Other Ambulatory Visit: Payer: Self-pay | Admitting: Gastroenterology

## 2015-01-05 ENCOUNTER — Encounter: Payer: Self-pay | Admitting: Internal Medicine

## 2015-01-05 ENCOUNTER — Ambulatory Visit (INDEPENDENT_AMBULATORY_CARE_PROVIDER_SITE_OTHER): Payer: Medicare Other

## 2015-01-05 ENCOUNTER — Ambulatory Visit (INDEPENDENT_AMBULATORY_CARE_PROVIDER_SITE_OTHER): Payer: Medicare Other | Admitting: Internal Medicine

## 2015-01-05 VITALS — BP 130/74 | HR 74 | Ht 63.25 in | Wt 135.4 lb

## 2015-01-05 DIAGNOSIS — I4891 Unspecified atrial fibrillation: Secondary | ICD-10-CM | POA: Diagnosis not present

## 2015-01-05 DIAGNOSIS — I428 Other cardiomyopathies: Secondary | ICD-10-CM

## 2015-01-05 DIAGNOSIS — I482 Chronic atrial fibrillation, unspecified: Secondary | ICD-10-CM

## 2015-01-05 DIAGNOSIS — I429 Cardiomyopathy, unspecified: Secondary | ICD-10-CM

## 2015-01-05 DIAGNOSIS — I442 Atrioventricular block, complete: Secondary | ICD-10-CM

## 2015-01-05 DIAGNOSIS — Z5181 Encounter for therapeutic drug level monitoring: Secondary | ICD-10-CM

## 2015-01-05 LAB — CUP PACEART INCLINIC DEVICE CHECK
Battery Remaining Longevity: 82 mo
Battery Voltage: 3.03 V
Brady Statistic AP VP Percent: 0 %
Brady Statistic AP VS Percent: 0 %
Brady Statistic AS VP Percent: 94.49 %
Brady Statistic AS VS Percent: 5.51 %
Brady Statistic RA Percent Paced: 0 %
Brady Statistic RV Percent Paced: 94.49 %
Date Time Interrogation Session: 20160523172642
Lead Channel Impedance Value: 1045 Ohm
Lead Channel Impedance Value: 304 Ohm
Lead Channel Impedance Value: 304 Ohm
Lead Channel Impedance Value: 380 Ohm
Lead Channel Impedance Value: 399 Ohm
Lead Channel Impedance Value: 570 Ohm
Lead Channel Impedance Value: 589 Ohm
Lead Channel Impedance Value: 646 Ohm
Lead Channel Impedance Value: 684 Ohm
Lead Channel Pacing Threshold Amplitude: 0.75 V
Lead Channel Pacing Threshold Amplitude: 1.25 V
Lead Channel Pacing Threshold Pulse Width: 0.4 ms
Lead Channel Pacing Threshold Pulse Width: 0.4 ms
Lead Channel Sensing Intrinsic Amplitude: 2.5 mV
Lead Channel Setting Pacing Amplitude: 2.5 V
Lead Channel Setting Pacing Amplitude: 2.5 V
Lead Channel Setting Pacing Pulse Width: 0.4 ms
Lead Channel Setting Pacing Pulse Width: 0.6 ms
Lead Channel Setting Sensing Sensitivity: 5.6 mV
Zone Setting Detection Interval: 400 ms
Zone Setting Detection Interval: 400 ms

## 2015-01-05 LAB — POCT INR: INR: 3

## 2015-01-05 NOTE — Progress Notes (Signed)
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 female with a h/o complete heart block and nonischemic CM sp PPM with subsequent upgrade (MDT) by Dr Leonia Reeves who presents today for follow-up in the Electrophysiology device clinic.  She underwent device pulse generator replacement by me in February.  She has done well since.  Today, she  denies symptoms of palpitations, chest pain, orthopnea, PND, lower extremity edema, dizziness, presyncope, syncope, or neurologic sequela.  The patientis tolerating medications without difficulties and is otherwise without complaint today.   Past Medical History  Diagnosis Date  . Fibroid   . Permanent atrial fibrillation   . Multiple sclerosis 1984  . Pulmonary hypertension, secondary 06/04/2013    As a result of nonischemic cardiomyopathy EF 25-30%  . Nonischemic cardiomyopathy     Moderate LVEF 35-40% by ECHO 2011, 25-30% by echo 2013  . Mitral regurgitation     moderate  . Chronic systolic heart failure   . Cardiac pacemaker in situ 06/2008    CRT  . DDD (degenerative disc disease), lumbar   . DDD (degenerative disc disease), cervical   . GERD (gastroesophageal reflux disease)   . Hearing loss 02/2010    L hearing loss/vertigo, steroids  . Adenomatous colon polyp   . Atrial flutter     A fib/flutter DCCV x 2   (05/2008 & 12/2009)  . CKD (chronic kidney disease), stage III   . Stroke     Right brain CVA, complete recovery 07/2003  . History of seizure disorder     Related Hx  . Cervical radiculopathy at C5   . Vertigo 02/2010    L hearing loss/vertigo, steroids  . Left atrial enlargement     severe   Past Surgical History  Procedure Laterality Date  . Tubal ligation    . Hysteroscopy    . Dilation and curettage of uterus    . Pacemaker insertion      PTVP 08/2001 for complete heart block. Upgrade PTVP to MDT BiV 06/2008 by Dr Leonia Reeves  . Biv pacemaker generator change out N/A 09/18/2014   Procedure: BIV PACEMAKER GENERATOR CHANGE OUT;  Surgeon: Thompson Grayer, MD;  Location: [facility] CATH LAB;  Service: Cardiovascular;  Laterality: N/A;    History   Social History  . Marital Status: Married    Spouse Name: N/A  . Number of Children: N/A  . Years of Education: N/A   Occupational History  . Not on file.   Social History Main Topics  . Smoking status: Never Smoker   . Smokeless tobacco: Not on file  . Alcohol Use: Yes     Comment: rare  . Drug Use: Not on file  . Sexual Activity: Yes    Birth Control/ Protection: Post-menopausal   Other Topics Concern  . Not on file   Social History Narrative    Family History  Problem Relation Age of Onset  . Diabetes Mother   . Multiple sclerosis Mother   . Cancer Father     ?  [name] CAD      Father's side CAD    No Known Allergies  Current Outpatient Prescriptions  Medication Sig Dispense Refill  . BIOTIN PO Take 1 capsule by mouth daily.    . Calcium Carbonate-Vitamin D (CALCIUM + D PO) Take 2 tablets by mouth daily.     . carvedilol (COREG) 12.5 MG tablet Take 1 tablet by mouth two  times daily 180  tablet 0  . COUMADIN 5 MG tablet Take as directed by Coumadin Clinic 30 tablet 3  . fexofenadine (ALLEGRA) 180 MG tablet Take 180 mg by mouth daily as needed for allergies.     . furosemide (LASIX) 40 MG tablet Take 1 tablet by mouth  daily 90 tablet 0  . lisinopril (PRINIVIL,ZESTRIL) 2.5 MG tablet Take 1 tablet by mouth  daily 90 tablet 0  . loratadine (CLARITIN) 10 MG tablet Take 10 mg by mouth daily as needed for allergies.    [name] spironolactone (ALDACTONE) 25 MG tablet TAKE 1/2 TABLET BY MOUTH EVERY DAY. (Patient taking differently: Take 12.5 mg by mouth daily. ) 45 tablet 3   No current facility-administered medications for this visit.    ROS- all systems are reviewed and negative except as per HPI  Physical Exam: Filed Vitals:   01/05/15 1003  BP: 130/74  Pulse: 74  Height: 5' 3.25" (1.607 m)  Weight: 61.417 kg (135  lb 6.4 oz)    GEN- The patient is well appearing, alert and oriented x 3 today.   Head- normocephalic, atraumatic Eyes-  Sclera clear, conjunctiva pink Ears- hearing intact Oropharynx- clear Neck- supple, no JVP Lymph- no cervical lymphadenopathy Lungs- Clear to ausculation bilaterally, normal work of breathing Chest- pacemaker pocket is well healed Heart- Regular rate and rhythm (paced) GI- soft, NT, ND, + BS Extremities- no clubbing, cyanosis, or edema MS- no significant deformity or atrophy Skin- no rash or lesion Psych- euthymic mood, full affect Neuro- strength and sensation are intact  Pacemaker interrogation- reviewed in detail today,  See PACEART report  Assessment and Plan:  1. Complete heart block Normal pacemaker function See Pace Art report No changes today  2. Permanent afib Continue coumadin long term.    3. Nonischemic CM Stable No change required today  carelink Return to see EP NP in 1 year  Thompson Grayer MD, [facility] 01/05/2015 10:56 PM

## 2015-01-05 NOTE — Patient Instructions (Signed)
Medication Instructions:  Your physician recommends that you continue on your current medications as directed. Please refer to the Current Medication list given to you today.   Labwork: None ordered  Testing/Procedures: None ordered  Follow-Up: Your physician wants you to follow-up in: 12 months with Chanetta Marshall, NP You will receive a reminder letter in the mail two months in advance. If you don't receive a letter, please call our office to schedule the follow-up appointment.  Remote monitoring is used to monitor your Pacemaker or ICD from home. This monitoring reduces the number of office visits required to check your device to one time per year. It allows [place] to keep an eye on the functioning of your device to ensure it is working properly. You are scheduled for a device check from home on 04/06/15. You may send your transmission at any time that day. If you have a wireless device, the transmission will be sent automatically. After your physician reviews your transmission, you will receive a postcard with your next transmission date.     Any Other Special Instructions Will Be Listed Below (If Applicable).

## 2015-01-17 ENCOUNTER — Other Ambulatory Visit: Payer: Self-pay | Admitting: Cardiology

## 2015-01-21 ENCOUNTER — Other Ambulatory Visit: Payer: Self-pay

## 2015-01-21 MED ORDER — SPIRONOLACTONE 25 MG PO TABS: ORAL_TABLET | ORAL | Status: DC

## 2015-01-21 NOTE — Telephone Encounter (Signed)
Per note 5.23.16

## 2015-02-02 ENCOUNTER — Ambulatory Visit (INDEPENDENT_AMBULATORY_CARE_PROVIDER_SITE_OTHER): Payer: Medicare Other | Admitting: *Deleted

## 2015-02-02 DIAGNOSIS — Z5181 Encounter for therapeutic drug level monitoring: Secondary | ICD-10-CM

## 2015-02-02 DIAGNOSIS — I4891 Unspecified atrial fibrillation: Secondary | ICD-10-CM | POA: Diagnosis not present

## 2015-02-02 DIAGNOSIS — I482 Chronic atrial fibrillation, unspecified: Secondary | ICD-10-CM

## 2015-02-02 LAB — POCT INR: INR: 2.5

## 2015-02-21 ENCOUNTER — Other Ambulatory Visit: Payer: Self-pay | Admitting: Cardiology

## 2015-03-17 ENCOUNTER — Ambulatory Visit (INDEPENDENT_AMBULATORY_CARE_PROVIDER_SITE_OTHER): Payer: Medicare Other | Admitting: Pharmacist

## 2015-03-17 DIAGNOSIS — I4891 Unspecified atrial fibrillation: Secondary | ICD-10-CM | POA: Diagnosis not present

## 2015-03-17 DIAGNOSIS — I482 Chronic atrial fibrillation, unspecified: Secondary | ICD-10-CM

## 2015-03-17 DIAGNOSIS — Z5181 Encounter for therapeutic drug level monitoring: Secondary | ICD-10-CM

## 2015-03-17 LAB — POCT INR: INR: 3.4

## 2015-03-23 ENCOUNTER — Other Ambulatory Visit: Payer: Self-pay | Admitting: *Deleted

## 2015-03-23 MED ORDER — CARVEDILOL 12.5 MG PO TABS: ORAL_TABLET | ORAL | Status: DC

## 2015-03-23 MED ORDER — COUMADIN 5 MG PO TABS: ORAL_TABLET | ORAL | Status: DC

## 2015-03-24 ENCOUNTER — Encounter (HOSPITAL_COMMUNITY): Payer: Self-pay

## 2015-03-24 ENCOUNTER — Ambulatory Visit (HOSPITAL_COMMUNITY): Admit: 2015-03-24 | Payer: Self-pay | Admitting: Gastroenterology

## 2015-03-24 SURGERY — COLONOSCOPY WITH PROPOFOL
Anesthesia: Monitor Anesthesia Care

## 2015-04-06 ENCOUNTER — Telehealth: Payer: Self-pay | Admitting: Internal Medicine

## 2015-04-06 ENCOUNTER — Ambulatory Visit (INDEPENDENT_AMBULATORY_CARE_PROVIDER_SITE_OTHER): Payer: Medicare Other | Admitting: *Deleted

## 2015-04-06 DIAGNOSIS — I442 Atrioventricular block, complete: Secondary | ICD-10-CM | POA: Diagnosis not present

## 2015-04-06 NOTE — Telephone Encounter (Signed)
New Message ° °4. Are you calling to see if we received your device transmission? Yes  ° °

## 2015-04-06 NOTE — Telephone Encounter (Signed)
LMOM that transmission was received.

## 2015-04-07 NOTE — Progress Notes (Signed)
Remote pacemaker transmission.   

## 2015-04-10 ENCOUNTER — Ambulatory Visit (INDEPENDENT_AMBULATORY_CARE_PROVIDER_SITE_OTHER): Payer: Medicare Other | Admitting: *Deleted

## 2015-04-10 DIAGNOSIS — I482 Chronic atrial fibrillation, unspecified: Secondary | ICD-10-CM

## 2015-04-10 DIAGNOSIS — Z5181 Encounter for therapeutic drug level monitoring: Secondary | ICD-10-CM

## 2015-04-10 DIAGNOSIS — I4891 Unspecified atrial fibrillation: Secondary | ICD-10-CM

## 2015-04-10 LAB — POCT INR: INR: 3

## 2015-04-17 LAB — CUP PACEART REMOTE DEVICE CHECK
Battery Remaining Longevity: 73 mo
Battery Voltage: 3.02 V
Brady Statistic AP VP Percent: 0 %
Brady Statistic AP VS Percent: 0 %
Brady Statistic AS VP Percent: 91.68 %
Brady Statistic AS VS Percent: 8.32 %
Brady Statistic RA Percent Paced: 0 %
Brady Statistic RV Percent Paced: 91.68 %
Date Time Interrogation Session: 20160822193851
Lead Channel Impedance Value: 1007 Ohm
Lead Channel Impedance Value: 304 Ohm
Lead Channel Impedance Value: 304 Ohm
Lead Channel Impedance Value: 380 Ohm
Lead Channel Impedance Value: 399 Ohm
Lead Channel Impedance Value: 570 Ohm
Lead Channel Impedance Value: 589 Ohm
Lead Channel Impedance Value: 665 Ohm
Lead Channel Impedance Value: 684 Ohm
Lead Channel Pacing Threshold Amplitude: 0.75 V
Lead Channel Pacing Threshold Pulse Width: 0.4 ms
Lead Channel Sensing Intrinsic Amplitude: 2.375 mV
Lead Channel Sensing Intrinsic Amplitude: 2.375 mV
Lead Channel Setting Pacing Amplitude: 2.5 V
Lead Channel Setting Pacing Amplitude: 2.5 V
Lead Channel Setting Pacing Pulse Width: 0.4 ms
Lead Channel Setting Pacing Pulse Width: 0.6 ms
Lead Channel Setting Sensing Sensitivity: 5.6 mV
Zone Setting Detection Interval: 400 ms
Zone Setting Detection Interval: 400 ms

## 2015-04-27 ENCOUNTER — Other Ambulatory Visit: Payer: Self-pay

## 2015-04-27 ENCOUNTER — Telehealth: Payer: Self-pay

## 2015-04-27 MED ORDER — CARVEDILOL 12.5 MG PO TABS: ORAL_TABLET | ORAL | Status: DC

## 2015-04-27 NOTE — Telephone Encounter (Signed)
Patient calls back to let us know she is out of her Carvedilol. Advised she needed to schedule an appointment with Dr. Marlou Porch. She is agreeable. Transferred to scheduling. Refill sent to [facility].

## 2015-04-27 NOTE — Telephone Encounter (Signed)
Patient called in and left voicemail requesting refill of coreg- Needs follow up visit with Dr. Noel Christmas calling back to get patient to schedule follow up but phone just kept ringing.

## 2015-05-03 ENCOUNTER — Encounter: Payer: Self-pay | Admitting: Cardiology

## 2015-05-05 ENCOUNTER — Encounter: Payer: Self-pay | Admitting: Cardiology

## 2015-05-07 ENCOUNTER — Ambulatory Visit (INDEPENDENT_AMBULATORY_CARE_PROVIDER_SITE_OTHER): Payer: Medicare Other | Admitting: Cardiology

## 2015-05-07 ENCOUNTER — Ambulatory Visit (INDEPENDENT_AMBULATORY_CARE_PROVIDER_SITE_OTHER): Payer: Medicare Other | Admitting: *Deleted

## 2015-05-07 ENCOUNTER — Encounter: Payer: Self-pay | Admitting: Cardiology

## 2015-05-07 VITALS — BP 116/68 | HR 88 | Ht 63.25 in | Wt 133.0 lb

## 2015-05-07 DIAGNOSIS — I428 Other cardiomyopathies: Secondary | ICD-10-CM

## 2015-05-07 DIAGNOSIS — Z950 Presence of cardiac pacemaker: Secondary | ICD-10-CM | POA: Diagnosis not present

## 2015-05-07 DIAGNOSIS — I4891 Unspecified atrial fibrillation: Secondary | ICD-10-CM

## 2015-05-07 DIAGNOSIS — I481 Persistent atrial fibrillation: Secondary | ICD-10-CM | POA: Diagnosis not present

## 2015-05-07 DIAGNOSIS — I272 Other secondary pulmonary hypertension: Secondary | ICD-10-CM | POA: Diagnosis not present

## 2015-05-07 DIAGNOSIS — I429 Cardiomyopathy, unspecified: Secondary | ICD-10-CM

## 2015-05-07 DIAGNOSIS — I4819 Other persistent atrial fibrillation: Secondary | ICD-10-CM

## 2015-05-07 DIAGNOSIS — Z5181 Encounter for therapeutic drug level monitoring: Secondary | ICD-10-CM

## 2015-05-07 DIAGNOSIS — IMO0002 Reserved for concepts with insufficient information to code with codable children: Secondary | ICD-10-CM

## 2015-05-07 LAB — POCT INR: INR: 2.8

## 2015-05-07 MED ORDER — FUROSEMIDE 40 MG PO TABS: ORAL_TABLET | ORAL | Status: DC

## 2015-05-07 MED ORDER — CARVEDILOL 12.5 MG PO TABS: ORAL_TABLET | ORAL | Status: DC

## 2015-05-07 MED ORDER — SPIRONOLACTONE 25 MG PO TABS: ORAL_TABLET | ORAL | Status: DC

## 2015-05-07 MED ORDER — LISINOPRIL 2.5 MG PO TABS: ORAL_TABLET | ORAL | Status: DC

## 2015-05-07 NOTE — Patient Instructions (Addendum)
Medication Instructions:  DECREASE YOUR LASIX TO 20 MG DAILY. OK TO USE AN EXTRA 1/2 TABLET AS NEEDED FOR INCREASED SWELLING   Labwork: NONE  Testing/Procedures: NONE  Follow-Up: Your physician wants you to follow-up in: 1 YEAR You will receive a reminder letter in the mail two months in advance. If you don't receive a letter, please call our office to schedule the follow-up appointment.

## 2015-05-07 NOTE — Progress Notes (Signed)
[name]. [address] [place], [place]  [address] Phone: [phone] Fax:  [phone]  Date:  05/07/2015   ID:  Kristi Parker, DOB [birth year], MRN [id number]  PCP:  Irven Shelling, MD   History of Present Illness: Kristi Parker is a 64 y.o. female with nonischemic cardiomyopathy ejection fraction 25-30%, secondary pulmonary hypertension as a result of this, prior stroke over 10 years ago, atrial fibrillation on chronic anticoagulation, biventricular pacemaker 2009, moderate mitral regurgitation seen on TEE in 2011 here for followup. She's off of amiodarone because of persistent/chronic atrial fibrillation.  Still feeling some dizziness not as much.  NYHA 2. Doing exercise in morning. Two walks a day. Watching was she is eating. Occasional dizziness has improved with decreased medication dose. Made changes as below. Still decreasing her furosemide to 20 mg.   Wt Readings from Last 3 Encounters:  05/07/15 133 lb (60.328 kg)  01/05/15 135 lb 6.4 oz (61.417 kg)  09/18/14 130 lb (58.968 kg)     Past Medical History  Diagnosis Date  . Fibroid   . Permanent atrial fibrillation   . Multiple sclerosis 1984  . Pulmonary hypertension, secondary 06/04/2013    As a result of nonischemic cardiomyopathy EF 25-30%  . Nonischemic cardiomyopathy     Moderate LVEF 35-40% by ECHO 2011, 25-30% by echo 2013  . Mitral regurgitation     moderate  . Chronic systolic heart failure   . Cardiac pacemaker in situ 06/2008    CRT  . DDD (degenerative disc disease), lumbar   . DDD (degenerative disc disease), cervical   . GERD (gastroesophageal reflux disease)   . Hearing loss 02/2010    L hearing loss/vertigo, steroids  . Adenomatous colon polyp   . Atrial flutter     A fib/flutter DCCV x 2   (05/2008 & 12/2009)  . CKD (chronic kidney disease), stage III   . Stroke     Right brain CVA, complete recovery 07/2003  . History of seizure disorder     Related Hx  . Cervical  radiculopathy at C5   . Vertigo 02/2010    L hearing loss/vertigo, steroids  . Left atrial enlargement     severe    Past Surgical History  Procedure Laterality Date  . Tubal ligation    . Hysteroscopy    . Dilation and curettage of uterus    . Pacemaker insertion      PTVP 08/2001 for complete heart block. Upgrade PTVP to MDT BiV 06/2008 by Dr Leonia Reeves  . Biv pacemaker generator change out N/A 09/18/2014    Procedure: BIV PACEMAKER GENERATOR CHANGE OUT;  Surgeon: Thompson Grayer, MD;  Location: [facility] CATH LAB;  Service: Cardiovascular;  Laterality: N/A;    Current Outpatient Prescriptions  Medication Sig Dispense Refill  . BIOTIN PO Take 1 capsule by mouth daily.    . Calcium Carbonate-Vitamin D (CALCIUM + D PO) Take 2 tablets by mouth daily.     . carvedilol (COREG) 12.5 MG tablet Take 1 tablet by mouth two  times daily 60 tablet 1  . COUMADIN 5 MG tablet Take as directed by Coumadin Clinic 30 tablet 3  . fexofenadine (ALLEGRA) 180 MG tablet Take 180 mg by mouth daily as needed for allergies.     . furosemide (LASIX) 40 MG tablet Take 1 tablet by mouth  daily 30 tablet 6  . lisinopril (PRINIVIL,ZESTRIL) 2.5 MG tablet Take 1 tablet by mouth  daily 30  tablet 6  . loratadine (CLARITIN) 10 MG tablet Take 10 mg by mouth daily as needed for allergies.    [name] spironolactone (ALDACTONE) 25 MG tablet TAKE 1/2 TABLET BY MOUTH EVERY DAY. 45 tablet 3   No current facility-administered medications for this visit.    Allergies:   No Known Allergies  Social History:  The patient  reports that she has never smoked. She does not have any smokeless tobacco history on file. She reports that she drinks alcohol.   ROS:  Please see the history of present illness.  No syncope, no bleeding, no orthopnea, no PND. She still having occasional shortness of breath when bending over or walking up stairs. She's also having some dizziness at times. Could be secondary to hypotension.   All other systems reviewed and  negative.   PHYSICAL EXAM: VS:  BP 116/68 mmHg  Pulse 88  Ht 5' 3.25" (1.607 m)  Wt 133 lb (60.328 kg)  BMI 23.36 kg/m2  SpO2 98% Well nourished, well developed, in no acute distress HEENT: normal Neck: no JVD Cardiac:  normal S1, S2; RRR; soft systolic murmurLeft lower sternal Lungs:  clear to auscultation bilaterally, no wheezing, rhonchi or rales Abd: soft, nontender, no hepatomegaly Ext: no edema Skin: warm and drybruise back on shoulder Neuro: no focal abnormalities noted  EKG:  Biventricular pacing rate 63, underlying atrial fibrillation, no change from prior.      ASSESSMENT AND PLAN:  1. Nonischemic cardiomyopathy-with relative hypotension, I cut back her carvedilol from 25 mg twice a day down to 12.5 twice a day and I cut her lisinopril back from 5 mg once a day down to 2.5 mg once a day. Her husband has a blood pressure cuff and I would like her to monitor this. This was the likely reason for her occasional dizziness. However, she continues to feel this however more rarely then previous. I will decrease her furosemide to 20 mg once a day from 40. If she notices increasing weight, she can take an extra Lasix. 2. Pacemaker-functioning well. Dr. [name] note reviewed. 3. Atrial fibrillation-currently anticoagulated. Coumadin monitored. Prior stroke over 10 years ago.  4. Pulmonary hypertension, secondary-as a result of cardiomyopathy. Overall mild. Well controlled. 5. Moderate mitral regurgitation-murmur is not that impressive. Continue to monitor. Clinically doing well. Secondary treatment. 6. We will see her back in one year. She is also seeing Dr. Laurann Montana as well.  Signed, Candee Furbish, MD [facility]  05/07/2015 9:58 AM

## 2015-05-19 ENCOUNTER — Encounter: Payer: Self-pay | Admitting: Cardiology

## 2015-05-20 ENCOUNTER — Encounter: Payer: Self-pay | Admitting: Internal Medicine

## 2015-06-04 ENCOUNTER — Ambulatory Visit (INDEPENDENT_AMBULATORY_CARE_PROVIDER_SITE_OTHER): Payer: Medicare Other | Admitting: *Deleted

## 2015-06-04 DIAGNOSIS — Z5181 Encounter for therapeutic drug level monitoring: Secondary | ICD-10-CM

## 2015-06-04 DIAGNOSIS — I481 Persistent atrial fibrillation: Secondary | ICD-10-CM

## 2015-06-04 DIAGNOSIS — I4819 Other persistent atrial fibrillation: Secondary | ICD-10-CM

## 2015-06-04 DIAGNOSIS — I4891 Unspecified atrial fibrillation: Secondary | ICD-10-CM

## 2015-06-04 LAB — POCT INR: INR: 3.1

## 2015-07-01 ENCOUNTER — Ambulatory Visit (INDEPENDENT_AMBULATORY_CARE_PROVIDER_SITE_OTHER): Payer: Medicare Other | Admitting: *Deleted

## 2015-07-01 DIAGNOSIS — I481 Persistent atrial fibrillation: Secondary | ICD-10-CM

## 2015-07-01 DIAGNOSIS — I4891 Unspecified atrial fibrillation: Secondary | ICD-10-CM

## 2015-07-01 DIAGNOSIS — Z5181 Encounter for therapeutic drug level monitoring: Secondary | ICD-10-CM | POA: Diagnosis not present

## 2015-07-01 DIAGNOSIS — I4819 Other persistent atrial fibrillation: Secondary | ICD-10-CM

## 2015-07-01 LAB — POCT INR: INR: 2.4

## 2015-07-08 ENCOUNTER — Telehealth: Payer: Self-pay | Admitting: Cardiology

## 2015-07-08 ENCOUNTER — Ambulatory Visit (INDEPENDENT_AMBULATORY_CARE_PROVIDER_SITE_OTHER): Payer: Medicare Other | Admitting: *Deleted

## 2015-07-08 DIAGNOSIS — I442 Atrioventricular block, complete: Secondary | ICD-10-CM

## 2015-07-08 NOTE — Telephone Encounter (Signed)
LMOVM reminding pt to send remote transmission.   

## 2015-07-08 NOTE — Progress Notes (Signed)
Remote pacemaker transmission.   

## 2015-07-20 ENCOUNTER — Telehealth: Payer: Self-pay | Admitting: Nurse Practitioner

## 2015-07-20 LAB — CUP PACEART REMOTE DEVICE CHECK
Battery Remaining Longevity: 67 mo
Battery Voltage: 3.01 V
Brady Statistic AP VP Percent: 0 %
Brady Statistic AP VS Percent: 0 %
Brady Statistic AS VP Percent: 93.73 %
Brady Statistic AS VS Percent: 6.27 %
Brady Statistic RA Percent Paced: 0 %
Brady Statistic RV Percent Paced: 93.73 %
Date Time Interrogation Session: 20161123225654
Implantable Lead Implant Date: 20030107
Implantable Lead Implant Date: 20091104
Implantable Lead Implant Date: 20091104
Implantable Lead Location: 753858
Implantable Lead Location: 753859
Implantable Lead Location: 753860
Implantable Lead Model: 4196
Implantable Lead Model: 4458
Implantable Lead Model: 5076
Implantable Lead Serial Number: 302739
Lead Channel Impedance Value: 1045 Ohm
Lead Channel Impedance Value: 323 Ohm
Lead Channel Impedance Value: 342 Ohm
Lead Channel Impedance Value: 380 Ohm
Lead Channel Impedance Value: 418 Ohm
Lead Channel Impedance Value: 589 Ohm
Lead Channel Impedance Value: 627 Ohm
Lead Channel Impedance Value: 665 Ohm
Lead Channel Impedance Value: 722 Ohm
Lead Channel Pacing Threshold Amplitude: 0.75 V
Lead Channel Pacing Threshold Pulse Width: 0.4 ms
Lead Channel Sensing Intrinsic Amplitude: 1.875 mV
Lead Channel Sensing Intrinsic Amplitude: 1.875 mV
Lead Channel Setting Pacing Amplitude: 2.5 V
Lead Channel Setting Pacing Amplitude: 2.5 V
Lead Channel Setting Pacing Pulse Width: 0.4 ms
Lead Channel Setting Pacing Pulse Width: 0.6 ms
Lead Channel Setting Sensing Sensitivity: 5.6 mV

## 2015-07-20 NOTE — Telephone Encounter (Signed)
Optivol elevated on recent remote transmission. Diuretic dose decreased 04/2015. Left message for patient to call to review symptoms of HF.   Will need to enroll in [facility] clinic. Asked patient to ask for Sharman Cheek when she calls back.   Chanetta Marshall, NP 07/20/2015 1:44 PM

## 2015-07-21 ENCOUNTER — Telehealth: Payer: Self-pay

## 2015-07-21 ENCOUNTER — Encounter: Payer: Self-pay | Admitting: Cardiology

## 2015-07-21 NOTE — Telephone Encounter (Signed)
Spoke with patient and she agreed to [facility] enrollment

## 2015-07-21 NOTE — Telephone Encounter (Signed)
Received incoming call from patient.  Patient referred by Chanetta Marshall, NP for [facility] clinic.  ICM introduction given and explained program.  She agreed to try the monthly calls and transmissions to monitor for HF symptoms. She is currently in the [employer] CHF program and weight is monitored closely.   I asked if she has decreased the Furosemide 40 mg to 1/2 tablet a day since her visit with Dr Marlou Porch and she stated no.  She stated she has continued to take Furosemide 40 mg 1 tablet due to she feels like she is retaining fluid and her weight fluctuates a lot.  She tries to eat low sodium foods.  Advised will schedule her 1st ICM transmission and will send letter with the date.  Scheduled a transmission on 08/11/2015.  Encouraged her to call for any questions and provided direct number.

## 2015-07-30 ENCOUNTER — Ambulatory Visit (INDEPENDENT_AMBULATORY_CARE_PROVIDER_SITE_OTHER): Payer: Medicare Other | Admitting: *Deleted

## 2015-07-30 DIAGNOSIS — Z5181 Encounter for therapeutic drug level monitoring: Secondary | ICD-10-CM

## 2015-07-30 DIAGNOSIS — I4891 Unspecified atrial fibrillation: Secondary | ICD-10-CM

## 2015-07-30 DIAGNOSIS — I481 Persistent atrial fibrillation: Secondary | ICD-10-CM

## 2015-07-30 DIAGNOSIS — I4819 Other persistent atrial fibrillation: Secondary | ICD-10-CM

## 2015-07-30 LAB — POCT INR: INR: 3.1

## 2015-08-04 ENCOUNTER — Encounter: Payer: Self-pay | Admitting: Cardiology

## 2015-08-11 ENCOUNTER — Ambulatory Visit (INDEPENDENT_AMBULATORY_CARE_PROVIDER_SITE_OTHER): Payer: Medicare Other

## 2015-08-11 DIAGNOSIS — Z950 Presence of cardiac pacemaker: Secondary | ICD-10-CM | POA: Diagnosis not present

## 2015-08-11 DIAGNOSIS — I442 Atrioventricular block, complete: Secondary | ICD-10-CM

## 2015-08-12 NOTE — Progress Notes (Addendum)
EPIC Encounter for ICM Monitoring  Patient Name: Kristi Parker is a 64 y.o. female Date: 08/12/2015 Primary Care Physican: Irven Shelling, MD Primary Cardiologist: Marlou Porch Electrophysiologist: Allred Dry Weight: 134.2 lb       Bi-V Pacing 96.6%  In the past month, have you:  1. Gained more than 2 pounds in a day or more than 5 pounds in a week? No, but she has gained weight in the last couple of weeks.  Her normal dry weight is around 130-131 lbs.   2. Had changes in your medications (with verification of current medications)? no  3. Had more shortness of breath than is usual for you? no  4. Limited your activity because of shortness of breath? no  5. Not been able to sleep because of shortness of breath? no  6. Had increased swelling in your feet or ankles? no  7. Had symptoms of dehydration (dizziness, dry mouth, increased thirst, decreased urine output) no  8. Had changes in sodium restriction? no  9. Been compliant with medication? Yes, she reported she has not decreased the Furosemide 40 mg to 1/2 tablet as advised by Dr Marlou Porch and she stated he told her she could continue on 1 tablet daily if she feels that she needs it.     ICM trend: 08/12/2015   Follow-up plan: ICM clinic phone appointment 09/14/2015.  Optivol thoracic impedance below baseline until 07/23/2015 and again on 08/08/2015 and she reported it was from eating foods higher in sodium due to holidays.  She reported weight gain but no other symptoms at this time.  She stated in the past when she has fluid retention symptoms it is usually shortness of breath and fatigue.  She stated she plans on getting back on her normal low sodium diet today and thinks this should help alleviate any fluid.  She stated she did not want to take any extra fluid medication at this time because she can adjust her diet.  Advised if weight does not decrease or she develops other fluid retention symptoms to call back and provided  number.  No changes today.      Copy of note sent to patient's primary care physician, primary cardiologist, and device following physician.  [name], RN, CCM 08/12/2015 10:13 AM

## 2015-08-27 ENCOUNTER — Ambulatory Visit (INDEPENDENT_AMBULATORY_CARE_PROVIDER_SITE_OTHER): Payer: Medicare Other | Admitting: *Deleted

## 2015-08-27 DIAGNOSIS — I4891 Unspecified atrial fibrillation: Secondary | ICD-10-CM | POA: Diagnosis not present

## 2015-08-27 DIAGNOSIS — Z5181 Encounter for therapeutic drug level monitoring: Secondary | ICD-10-CM | POA: Diagnosis not present

## 2015-08-27 DIAGNOSIS — I481 Persistent atrial fibrillation: Secondary | ICD-10-CM | POA: Diagnosis not present

## 2015-08-27 DIAGNOSIS — I4819 Other persistent atrial fibrillation: Secondary | ICD-10-CM

## 2015-08-27 LAB — POCT INR: INR: 3

## 2015-09-05 ENCOUNTER — Other Ambulatory Visit: Payer: Self-pay | Admitting: Cardiology

## 2015-09-07 ENCOUNTER — Telehealth: Payer: Self-pay | Admitting: Cardiology

## 2015-09-07 NOTE — Telephone Encounter (Signed)
Lm to cb to discuss concerns 

## 2015-09-07 NOTE — Telephone Encounter (Signed)
This morning pt had an episode of leg pain, cramp sensation that has since resolved. Denies redness/edema and warmth to leg. Her concern is that with the leg pain she experienced dizziness, sob and was sweaty. This resolved after laying down and resting.  She was weighing herself when this happened and stated her weight was down. She feels normal now. She wants to know if she should be seen and if she should send a pacer transmission? Please call pt back.  Pt aware Pam RN will not call till later and aware Dr Etter Sjogren is out of office.  I will forward to Dr/nurse.

## 2015-09-07 NOTE — Telephone Encounter (Signed)
F/u ° ° °Pt returning your call °

## 2015-09-07 NOTE — Telephone Encounter (Signed)
New Message   Pt requested to speak w/ RN- discuss symptoms- c/o of left leg pain, feeling "faint" and overheated. Please call back and discuss.  Can call cell phone as well as listed home #- [phone]. Please call back and discuss.

## 2015-09-07 NOTE — Telephone Encounter (Signed)
Spoke with pt who reports this am she did have an episode after getting up to weigh with an usual cramping sensation in her shin area (on the front part of my leg between my ankle and my knee).  She did have some dizziness, SOB and sweating when she had the pain.  Her husband helped her to lay down and she took Tylenol.  She is unsure if the rest or Tylenol helped with the discomfort.  Since this resolved, it has not reoccurred and she has been able to run errands etc. Today without further problems.  She did mix her AM medications up and took her fluid pills at night last night.  Her wt is down 3 lbs today.  Advised to go back to normal schedule for medications, continue to weigh daily and notify the office if s/s reoccur.  She is aware I will forward this information to Dr Marlou Porch for review.

## 2015-09-14 ENCOUNTER — Telehealth: Payer: Self-pay | Admitting: Cardiology

## 2015-09-14 ENCOUNTER — Ambulatory Visit: Payer: Medicare Other

## 2015-09-14 ENCOUNTER — Telehealth: Payer: Self-pay

## 2015-09-14 NOTE — Progress Notes (Signed)
error 

## 2015-09-14 NOTE — Telephone Encounter (Signed)
Remote ICM transmission received.  Attempted patient call and left message for return call.   

## 2015-09-14 NOTE — Telephone Encounter (Signed)
LMOVM reminding pt to send remote transmission.   

## 2015-09-18 NOTE — Telephone Encounter (Signed)
Unable to reach patient for ICM follow up.  Next scheduled transmission 10/07/2015.  Letter sent with new transmission date.   ICM trend 09/14/2015 Optivol thoracic impedance trending along reference line.

## 2015-09-24 ENCOUNTER — Ambulatory Visit (INDEPENDENT_AMBULATORY_CARE_PROVIDER_SITE_OTHER): Payer: Medicare Other | Admitting: *Deleted

## 2015-09-24 DIAGNOSIS — I4819 Other persistent atrial fibrillation: Secondary | ICD-10-CM

## 2015-09-24 DIAGNOSIS — Z5181 Encounter for therapeutic drug level monitoring: Secondary | ICD-10-CM | POA: Diagnosis not present

## 2015-09-24 DIAGNOSIS — I481 Persistent atrial fibrillation: Secondary | ICD-10-CM

## 2015-09-24 DIAGNOSIS — I4891 Unspecified atrial fibrillation: Secondary | ICD-10-CM | POA: Diagnosis not present

## 2015-09-24 LAB — POCT INR: INR: 2.9

## 2015-10-07 ENCOUNTER — Telehealth: Payer: Self-pay | Admitting: Cardiology

## 2015-10-07 ENCOUNTER — Ambulatory Visit (INDEPENDENT_AMBULATORY_CARE_PROVIDER_SITE_OTHER): Payer: Medicare Other | Admitting: *Deleted

## 2015-10-07 DIAGNOSIS — I442 Atrioventricular block, complete: Secondary | ICD-10-CM | POA: Diagnosis not present

## 2015-10-07 DIAGNOSIS — I429 Cardiomyopathy, unspecified: Secondary | ICD-10-CM

## 2015-10-07 DIAGNOSIS — I428 Other cardiomyopathies: Secondary | ICD-10-CM

## 2015-10-07 DIAGNOSIS — Z950 Presence of cardiac pacemaker: Secondary | ICD-10-CM | POA: Diagnosis not present

## 2015-10-07 NOTE — Telephone Encounter (Signed)
Spoke with pt and reminded pt of remote transmission that is due today. Pt verbalized understanding.   

## 2015-10-08 NOTE — Progress Notes (Signed)
Remote pacemaker transmission.   

## 2015-10-23 NOTE — Progress Notes (Signed)
EPIC Encounter for ICM Monitoring  Patient Name: Kristi Parker is a 65 y.o. female Date: 10/23/2015 Primary Care Physican: Irven Shelling, MD Primary Cardiologist: Marlou Porch Electrophysiologist: Allred Dry Weight: unknown   Bi-V Pacing 96.7%      In the past month, have you:  1. Gained more than 2 pounds in a day or more than 5 pounds in a week? N/A  2. Had changes in your medications (with verification of current medications)? N/A  3. Had more shortness of breath than is usual for you? N/A  4. Limited your activity because of shortness of breath? N/A  5. Not been able to sleep because of shortness of breath? N/A  6. Had increased swelling in your feet or ankles? N/A  7. Had symptoms of dehydration (dizziness, dry mouth, increased thirst, decreased urine output) N/A  8. Had changes in sodium restriction? N/A  9. Been compliant with medication? N/A   ICM trend: 3 month view for 10/07/2015   ICM trend: 1 year view for 10/07/2015   Follow-up plan: ICM clinic phone appointment 11/23/2015.    Optivol thoracic impedance trending along baseline suggesting stable fluid levels.  No changes.    Copy of note sent to patient's primary care physician, primary cardiologist, and device following physician.  [name], RN, CCM 10/23/2015 12:11 PM

## 2015-10-26 ENCOUNTER — Ambulatory Visit (INDEPENDENT_AMBULATORY_CARE_PROVIDER_SITE_OTHER): Payer: Medicare Other | Admitting: Pharmacist

## 2015-10-26 DIAGNOSIS — Z5181 Encounter for therapeutic drug level monitoring: Secondary | ICD-10-CM | POA: Diagnosis not present

## 2015-10-26 DIAGNOSIS — I481 Persistent atrial fibrillation: Secondary | ICD-10-CM

## 2015-10-26 DIAGNOSIS — I4891 Unspecified atrial fibrillation: Secondary | ICD-10-CM

## 2015-10-26 DIAGNOSIS — I4819 Other persistent atrial fibrillation: Secondary | ICD-10-CM

## 2015-10-26 LAB — POCT INR: INR: 2.9

## 2015-11-03 LAB — CUP PACEART REMOTE DEVICE CHECK
Battery Remaining Longevity: 60 mo
Battery Voltage: 3 V
Brady Statistic AP VP Percent: 0 %
Brady Statistic AP VS Percent: 0 %
Brady Statistic AS VP Percent: 94.87 %
Brady Statistic AS VS Percent: 5.13 %
Brady Statistic RA Percent Paced: 0 %
Brady Statistic RV Percent Paced: 94.87 %
Date Time Interrogation Session: 20170223022735
Implantable Lead Implant Date: 20030107
Implantable Lead Implant Date: 20091104
Implantable Lead Implant Date: 20091104
Implantable Lead Location: 753858
Implantable Lead Location: 753859
Implantable Lead Location: 753860
Implantable Lead Model: 4196
Implantable Lead Model: 4458
Implantable Lead Model: 5076
Implantable Lead Serial Number: 302739
Lead Channel Impedance Value: 1045 Ohm
Lead Channel Impedance Value: 304 Ohm
Lead Channel Impedance Value: 323 Ohm
Lead Channel Impedance Value: 380 Ohm
Lead Channel Impedance Value: 399 Ohm
Lead Channel Impedance Value: 570 Ohm
Lead Channel Impedance Value: 589 Ohm
Lead Channel Impedance Value: 665 Ohm
Lead Channel Impedance Value: 684 Ohm
Lead Channel Pacing Threshold Amplitude: 0.625 V
Lead Channel Pacing Threshold Pulse Width: 0.4 ms
Lead Channel Sensing Intrinsic Amplitude: 2.375 mV
Lead Channel Sensing Intrinsic Amplitude: 2.375 mV
Lead Channel Setting Pacing Amplitude: 2.5 V
Lead Channel Setting Pacing Amplitude: 2.5 V
Lead Channel Setting Pacing Pulse Width: 0.4 ms
Lead Channel Setting Pacing Pulse Width: 0.6 ms
Lead Channel Setting Sensing Sensitivity: 5.6 mV

## 2015-11-04 ENCOUNTER — Encounter: Payer: Self-pay | Admitting: Cardiology

## 2015-11-20 ENCOUNTER — Encounter: Payer: Self-pay | Admitting: Cardiology

## 2015-11-23 ENCOUNTER — Telehealth: Payer: Self-pay | Admitting: Cardiology

## 2015-11-23 ENCOUNTER — Ambulatory Visit (INDEPENDENT_AMBULATORY_CARE_PROVIDER_SITE_OTHER): Payer: Medicare Other

## 2015-11-23 DIAGNOSIS — Z950 Presence of cardiac pacemaker: Secondary | ICD-10-CM | POA: Diagnosis not present

## 2015-11-23 DIAGNOSIS — I428 Other cardiomyopathies: Secondary | ICD-10-CM

## 2015-11-23 DIAGNOSIS — I429 Cardiomyopathy, unspecified: Secondary | ICD-10-CM

## 2015-11-23 NOTE — Telephone Encounter (Signed)
LMOVM reminding pt to send remote transmission.   

## 2015-11-24 NOTE — Progress Notes (Signed)
EPIC Encounter for ICM Monitoring  Patient Name: Kristi Parker is a 65 y.o. female Date: 11/24/2015 Primary Care Physican: Irven Shelling, MD Primary Cardiologist: Marlou Porch Electrophysiologist: Allred Dry Weight: 135 lb   Bi-V Pacing 97.2%      In the past month, have you:  1. Gained more than 2 pounds in a day or more than 5 pounds in a week? no   2. Had changes in your medications (with verification of current medications)? no   3. Had more shortness of breath than is usual for you? no   4. Limited your activity because of shortness of breath? no   5. Not been able to sleep because of shortness of breath? no   6. Had increased swelling in your feet or ankles? no   7. Had symptoms of dehydration (dizziness, dry mouth, increased thirst, decreased urine output) no   8. Had changes in sodium restriction? no   9. Been compliant with medication? Yes    ICM trend: 3 month view for 11/23/2015   ICM trend: 1 year view for 11/23/2015   Follow-up plan: ICM clinic phone appointment on 12/25/2015.  Thoracic impedance trending along reference line suggesting stable fluid levels.  Patient denied fluid symptoms.  Education given to limit sodium intake to < 2000 mg and fluid intake to 64 oz daily.  Encouraged to call for any fluid symptoms.  No changes today.     [name], RN, CCM 11/24/2015 8:10 AM

## 2015-12-09 ENCOUNTER — Telehealth: Payer: Self-pay | Admitting: Cardiology

## 2015-12-09 NOTE — Telephone Encounter (Signed)
Last EF by echo was 40-45% 08/28/14

## 2015-12-09 NOTE — Telephone Encounter (Signed)
Left message on Kristi Parker's VM of the most recent EF/  Requested she c/b if further questions

## 2015-12-09 NOTE — Telephone Encounter (Signed)
New message      Calling to get most recent EF% for CHF program.  Nurse said she did not have an ejection fraction form. Please fax EF to [phone] attn Charlies Constable.

## 2015-12-10 ENCOUNTER — Ambulatory Visit (INDEPENDENT_AMBULATORY_CARE_PROVIDER_SITE_OTHER): Payer: Medicare Other | Admitting: *Deleted

## 2015-12-10 DIAGNOSIS — I4891 Unspecified atrial fibrillation: Secondary | ICD-10-CM

## 2015-12-10 DIAGNOSIS — Z5181 Encounter for therapeutic drug level monitoring: Secondary | ICD-10-CM | POA: Diagnosis not present

## 2015-12-10 DIAGNOSIS — I481 Persistent atrial fibrillation: Secondary | ICD-10-CM | POA: Diagnosis not present

## 2015-12-10 DIAGNOSIS — I4819 Other persistent atrial fibrillation: Secondary | ICD-10-CM

## 2015-12-10 LAB — POCT INR: INR: 2.4

## 2015-12-25 ENCOUNTER — Ambulatory Visit (INDEPENDENT_AMBULATORY_CARE_PROVIDER_SITE_OTHER): Payer: Medicare Other

## 2015-12-25 ENCOUNTER — Telehealth: Payer: Self-pay

## 2015-12-25 DIAGNOSIS — I429 Cardiomyopathy, unspecified: Secondary | ICD-10-CM

## 2015-12-25 DIAGNOSIS — I428 Other cardiomyopathies: Secondary | ICD-10-CM

## 2015-12-25 DIAGNOSIS — Z950 Presence of cardiac pacemaker: Secondary | ICD-10-CM

## 2015-12-25 NOTE — Telephone Encounter (Signed)
Remote ICM transmission received.  Attempted patient call and left message for return call.   

## 2015-12-25 NOTE — Progress Notes (Signed)
EPIC Encounter for ICM Monitoring  Patient Name: Kristi Parker is a 66 y.o. female Date: 12/25/2015 Primary Care Physican: Irven Shelling, MD Primary Cardiologist: Marlou Porch Electrophysiologist: Allred Dry Weight: unknown   Bi-V Pacing 95.4%      In the past month, have you:  1. Gained more than 2 pounds in a day or more than 5 pounds in a week? N/A  2. Had changes in your medications (with verification of current medications)? N/A  3. Had more shortness of breath than is usual for you? N/A  4. Limited your activity because of shortness of breath? N/A  5. Not been able to sleep because of shortness of breath? N/A  6. Had increased swelling in your feet or ankles? N/A  7. Had symptoms of dehydration (dizziness, dry mouth, increased thirst, decreased urine output) N/A  8. Had changes in sodium restriction? N/A  9. Been compliant with medication? N/A   ICM trend: 3 month view for 12/25/2015   ICM trend: 1 year view for 12/25/2015   Follow-up plan: ICM clinic phone appointment on  12/30/2015.  Attempted call to patient and unable to reach.  Transmission reviewed.  Thoracic impedance below reference line from 12/14/2015 to 12/25/2015 suggesting fluid accumulation.    Copy of note sent to patient's primary care physician, primary cardiologist, and device following physician.  [name], RN, CCM 12/25/2015 11:57 AM

## 2015-12-28 NOTE — Progress Notes (Signed)
Received call back from patient.  Reviewed transmission and advised it suggested fluid accumulation from 12/14/2015 to 12/25/2015.   SYMPTOMS: She reported increase in weight by 2 pounds, swelling of feet and increase SOB with activities.  Weight is 134.8 lbs and baseline is normally 132 lbs.   Recommended she increase Furosemide 40 mg to 1.5 tablets x 3 days and then resume her dosage of 1/2 tablet as prescribed.  Next transmission scheduled for 01/01/2016 instead of 12/30/2015 to recheck fluid levels after increasing Furosemide x 3 days.    Advised will send to Dr Marlou Porch and Dr Rayann Heman for review and will call back if any further recommendations.

## 2016-01-01 ENCOUNTER — Telehealth: Payer: Self-pay | Admitting: Cardiology

## 2016-01-01 ENCOUNTER — Ambulatory Visit (INDEPENDENT_AMBULATORY_CARE_PROVIDER_SITE_OTHER): Payer: Medicare Other

## 2016-01-01 DIAGNOSIS — I429 Cardiomyopathy, unspecified: Secondary | ICD-10-CM

## 2016-01-01 DIAGNOSIS — Z950 Presence of cardiac pacemaker: Secondary | ICD-10-CM | POA: Diagnosis not present

## 2016-01-01 DIAGNOSIS — I428 Other cardiomyopathies: Secondary | ICD-10-CM

## 2016-01-01 NOTE — Progress Notes (Signed)
EPIC Encounter for ICM Monitoring  Patient Name: Kristi Parker is a 65 y.o. female Date: 01/01/2016 Primary Care Physican: Irven Shelling, MD Primary Cardiologist: Marlou Porch Electrophysiologist: Allred Dry Weight: 133.6 lbs   Bi-V Pacing 94.3%      In the past month, have you:  1. Gained more than 2 pounds in a day or more than 5 pounds in a week? No, lost 2 pounds  2. Had changes in your medications (with verification of current medications)? no  3. Had more shortness of breath than is usual for you? no  4. Limited your activity because of shortness of breath? no  5. Not been able to sleep because of shortness of breath? no  6. Had increased swelling in your feet, ankles, legs or stomach area? no  7. Had symptoms of dehydration (dizziness, dry mouth, increased thirst, decreased urine output) no  8. Had changes in sodium restriction? no  9. Been compliant with medication? Yes  ICM trend: 3 month view for 01/01/2016   ICM trend: 1 year view for 01/01/2016   Follow-up plan: ICM clinic phone appointment 02/03/2016.    FLUID LEVELS:  Since 12/25/2015 ICM transmission, Optivol thoracic impedance returned to baseline 12/29/2015 .    SYMPTOMS:   None.  Denied any symptoms such as weight gain of 3 pounds overnight or 5 pounds within a week, SOB and/or lower extremity swelling.   She reported she did not realize her breathing had been affected until after she had taken the extra Furosemide for 3 days as instructed on 12/25/2015.  She reported able to breathe more easily while exercising.   She understands Dr Marlou Porch has instructed her to take extra 1/2 Furosemide tablet if needed for fluid symptoms. Encouraged to call for any fluid symptoms if the extra medication does not help.    No changes today.    Advised will send updated transmission to Dr Marlou Porch and Dr Rayann Heman showing fluid level has returned to baseline after increasing Furosemide x 3 days.    [name], RN,  CCM 01/01/2016 10:10 AM

## 2016-01-01 NOTE — Telephone Encounter (Signed)
LMOVM reminding pt to send remote transmission.   

## 2016-01-21 ENCOUNTER — Ambulatory Visit (INDEPENDENT_AMBULATORY_CARE_PROVIDER_SITE_OTHER): Payer: Medicare Other | Admitting: Surgery

## 2016-01-21 DIAGNOSIS — I481 Persistent atrial fibrillation: Secondary | ICD-10-CM | POA: Diagnosis not present

## 2016-01-21 DIAGNOSIS — Z5181 Encounter for therapeutic drug level monitoring: Secondary | ICD-10-CM | POA: Diagnosis not present

## 2016-01-21 DIAGNOSIS — I4819 Other persistent atrial fibrillation: Secondary | ICD-10-CM

## 2016-01-21 DIAGNOSIS — I4891 Unspecified atrial fibrillation: Secondary | ICD-10-CM

## 2016-01-21 LAB — POCT INR: INR: 2.6

## 2016-01-25 ENCOUNTER — Telehealth: Payer: Self-pay | Admitting: Cardiology

## 2016-01-25 NOTE — Telephone Encounter (Signed)
Printed results of last echo from 08/28/14 and took to MR to be faxed as requested.

## 2016-01-25 NOTE — Telephone Encounter (Signed)
New message    The untied home health nurse is calling to get the injection fracture, you can call or fax her back either way they just need one documented.  Fax number [phone]  Telephone # [phone] ext [phone]

## 2016-02-03 ENCOUNTER — Ambulatory Visit (INDEPENDENT_AMBULATORY_CARE_PROVIDER_SITE_OTHER): Payer: Medicare Other

## 2016-02-03 DIAGNOSIS — I429 Cardiomyopathy, unspecified: Secondary | ICD-10-CM

## 2016-02-03 DIAGNOSIS — Z950 Presence of cardiac pacemaker: Secondary | ICD-10-CM | POA: Diagnosis not present

## 2016-02-03 DIAGNOSIS — I428 Other cardiomyopathies: Secondary | ICD-10-CM

## 2016-02-04 NOTE — Progress Notes (Signed)
EPIC Encounter for ICM Monitoring  Patient Name: Kristi Parker is a 65 y.o. female Date: 02/04/2016 Primary Care Physican: Irven Shelling, MD Primary Cardiologist: Marlou Porch Electrophysiologist: Allred Dry Weight: 135 lbs   Bi-V Pacing 96.5%      In the past month, have you:  1. Gained more than 2 pounds in a day or more than 5 pounds in a week? Yes, gained 3 lbs in last week  2. Had changes in your medications (with verification of current medications)? No  3. Had more shortness of breath than is usual for you? No   4. Limited your activity because of shortness of breath? No   5. Not been able to sleep because of shortness of breath? No   6. Had increased swelling in your feet, ankles, legs or stomach area? Yes, some swelling in the leg  7. Had symptoms of dehydration (dizziness, dry mouth, increased thirst, decreased urine output) No   8. Had changes in sodium restriction? No   9. Been compliant with medication? Patient reported she never decreased the Furosemide 40 mg to 1/2 tablet as discussed at appointment with Dr Marlou Porch on 05/07/2015.  She stated it was decreased due to dizziness but dizziness resolved so she continued the dosage of Furosemide 40 mg 1 tablet daily.    ICM trend: 3 month view for 02/03/2016   ICM trend: 1 year view for 02/03/2016   Follow-up plan: ICM clinic phone appointment 03/09/2016.      FLUID LEVELS: Optivol thoracic impedance decreased 01/20/2016 to 01/30/2016 and fluid index > threshold fluid 12/02/2015 to 02/01/2016 suggesting accumulation and returned to baseline on 02/01/2016 suggesting fluid levels are starting to stabilize.    SYMPTOMS: Weight gain of 3 pounds in last week and lower extremity swelling.    EDUCATION: Limit sodium intake to < 2000 mg and fluid intake to 64 oz daily.     RECOMMENDATIONS:  Recommended she increase Furosemide to 40 mg bid x 3 days or if she reaches baseline weight prior to 3rd day, then resume regular dosage of 40  mg daily.      Advised will send to PCP, Dr. Marlou Porch and Dr. Rayann Heman for review regarding symptoms and decreased thoracic impedance until 02/01/2016.  If any further recommendations, will call back.    [name], RN, CCM 02/04/2016 8:45 AM

## 2016-02-10 ENCOUNTER — Encounter: Payer: Self-pay | Admitting: Cardiology

## 2016-02-25 ENCOUNTER — Other Ambulatory Visit: Payer: Self-pay | Admitting: Cardiology

## 2016-02-26 ENCOUNTER — Other Ambulatory Visit: Payer: Self-pay | Admitting: Cardiology

## 2016-02-29 NOTE — Telephone Encounter (Signed)
carvedilol (COREG) 12.5 MG tablet  Medication   Date: 05/07/2015  Department: [address] [facility]  Ordering/Authorizing: Jerline Pain, MD      Order Providers    Prescribing Provider Encounter Provider   Jerline Pain, MD Jerline Pain, MD    Medication Detail      Disp Refills Start End     carvedilol (COREG) 12.5 MG tablet 180 tablet 3 05/07/2015     Sig: Take 1 tablet by mouth two times daily    Notes to Pharmacy: D/C RX SENT FOR 90 TABLETS    E-Prescribing Status: Receipt confirmed by pharmacy (05/07/2015 10:21 AM EDT)     Pharmacy    [id number] [facility] - [place], [place] - [address]   furosemide (LASIX) 40 MG tablet  Medication   Date: 05/07/2015  Department: [address] [facility]  Ordering/Authorizing: Jerline Pain, MD      Order Providers    Prescribing Provider Encounter Provider   Jerline Pain, MD Jerline Pain, MD    Medication Detail      Disp Refills Start End     furosemide (LASIX) 40 MG tablet 90 tablet 3 05/07/2015     Sig: TAKE 1/2 TABLET BY MOUTH DAILY. OK TO USE AN EXTRA 1/2 AS NEEDED FOR SWELLING    Patient taking differently: Take 40 mg by mouth daily. Takes 40 mg daily        E-Prescribing Status: Receipt confirmed by pharmacy (05/07/2015 10:20 AM EDT)     Pharmacy    [facility] [place], [place] - [address]   lisinopril (PRINIVIL,ZESTRIL) 2.5 MG tablet  Medication   Date: 05/07/2015  Department: [address] [facility]  Ordering/Authorizing: Jerline Pain, MD      Order Providers    Prescribing Provider Encounter Provider   Jerline Pain, MD Jerline Pain, MD    Medication Detail      Disp Refills Start End     lisinopril (PRINIVIL,ZESTRIL) 2.5 MG tablet 90 tablet 3 05/07/2015     Sig: Take 1 tablet by mouth daily    E-Prescribing Status: Receipt confirmed by pharmacy (05/07/2015 10:20 AM EDT)     Pharmacy    [facility] [place] [facility]   spironolactone (ALDACTONE) 25 MG tablet  Medication   Date: 05/07/2015  Department: [address] [facility]  Ordering/Authorizing: Jerline Pain, MD      Order Providers    Prescribing Provider Encounter Provider   Jerline Pain, MD Jerline Pain, MD    Medication Detail      Disp Refills Start End     spironolactone (ALDACTONE) 25 MG tablet 45 tablet 3 05/07/2015     Sig: TAKE 1/2 TABLET BY MOUTH EVERY DAY.    E-Prescribing Status: Receipt confirmed by pharmacy (05/07/2015 10:20 AM EDT)     Pharmacy    [facility], [place]

## 2016-03-03 ENCOUNTER — Ambulatory Visit (INDEPENDENT_AMBULATORY_CARE_PROVIDER_SITE_OTHER): Payer: Medicare Other | Admitting: *Deleted

## 2016-03-03 DIAGNOSIS — I4891 Unspecified atrial fibrillation: Secondary | ICD-10-CM

## 2016-03-03 DIAGNOSIS — I4819 Other persistent atrial fibrillation: Secondary | ICD-10-CM

## 2016-03-03 DIAGNOSIS — I481 Persistent atrial fibrillation: Secondary | ICD-10-CM | POA: Diagnosis not present

## 2016-03-03 DIAGNOSIS — Z5181 Encounter for therapeutic drug level monitoring: Secondary | ICD-10-CM | POA: Diagnosis not present

## 2016-03-03 LAB — POCT INR: INR: 2.9

## 2016-03-09 ENCOUNTER — Ambulatory Visit (INDEPENDENT_AMBULATORY_CARE_PROVIDER_SITE_OTHER): Payer: Medicare Other

## 2016-03-09 DIAGNOSIS — Z950 Presence of cardiac pacemaker: Secondary | ICD-10-CM

## 2016-03-09 DIAGNOSIS — I429 Cardiomyopathy, unspecified: Secondary | ICD-10-CM | POA: Diagnosis not present

## 2016-03-09 DIAGNOSIS — I428 Other cardiomyopathies: Secondary | ICD-10-CM

## 2016-03-10 ENCOUNTER — Telehealth: Payer: Self-pay

## 2016-03-10 NOTE — Telephone Encounter (Signed)
Remote ICM transmission received.  Attempted patient call and recording stated call cannot be completed as dialed.

## 2016-03-10 NOTE — Progress Notes (Signed)
EPIC Encounter for ICM Monitoring  Patient Name: Kristi Parker is a 65 y.o. female Date: 03/10/2016 Primary Care Physican: Irven Shelling, MD Primary Cardiologist: Marlou Porch Electrophysiologist: Allred Dry Weight: unknown Bi-V Pacing:  96.2%       Attempted patient call and unable to reach.  Transmission reviewed.   Thoracic impedance normal.    ICM trend: 03/09/2016    Follow-up plan: ICM clinic phone appointment on 04/11/2016.  Copy of ICM check sent to device physician.   [name], RN 03/10/2016 1:07 PM

## 2016-04-14 ENCOUNTER — Ambulatory Visit (INDEPENDENT_AMBULATORY_CARE_PROVIDER_SITE_OTHER): Payer: Medicare Other | Admitting: *Deleted

## 2016-04-14 ENCOUNTER — Other Ambulatory Visit: Payer: Self-pay | Admitting: Cardiology

## 2016-04-14 DIAGNOSIS — I4891 Unspecified atrial fibrillation: Secondary | ICD-10-CM

## 2016-04-14 DIAGNOSIS — Z5181 Encounter for therapeutic drug level monitoring: Secondary | ICD-10-CM

## 2016-04-14 LAB — POCT INR: INR: 2.7

## 2016-04-21 ENCOUNTER — Other Ambulatory Visit: Payer: Self-pay | Admitting: Cardiology

## 2016-04-21 NOTE — Progress Notes (Signed)
No ICM remote transmission received for 04/11/2016.  Scheduled next ICM remote transmission 05/10/2016.

## 2016-05-01 ENCOUNTER — Encounter: Payer: Self-pay | Admitting: Cardiology

## 2016-05-10 ENCOUNTER — Telehealth: Payer: Self-pay

## 2016-05-10 ENCOUNTER — Ambulatory Visit (INDEPENDENT_AMBULATORY_CARE_PROVIDER_SITE_OTHER): Payer: Medicare Other

## 2016-05-10 DIAGNOSIS — Z950 Presence of cardiac pacemaker: Secondary | ICD-10-CM | POA: Diagnosis not present

## 2016-05-10 DIAGNOSIS — I429 Cardiomyopathy, unspecified: Secondary | ICD-10-CM

## 2016-05-10 DIAGNOSIS — I428 Other cardiomyopathies: Secondary | ICD-10-CM

## 2016-05-10 NOTE — Telephone Encounter (Signed)
Remote ICM transmission received.  Attempted patient call and no voice mail.

## 2016-05-10 NOTE — Progress Notes (Signed)
EPIC Encounter for ICM Monitoring  Patient Name: Kristi Parker is a 65 y.o. female Date: 05/10/2016 Primary Care Physican: Irven Shelling, MD Primary Cardiologist: Marlou Porch Electrophysiologist: Allred Dry Weight:    unknown Bi-V Pacing:  98.1%       Attempted ICM call and unable to reach.  Transmission reviewed.   Thoracic impedance normal.  Impedance was below baseline 04/15/2016 to 05/08/2016 suggesting fluid during that time.  Follow-up plan: ICM clinic phone appointment on 06/10/2016.  Appointment with Dr Marlou Porch 05/11/2016.  Copy of ICM check sent to primary cardiologist and device physician.   ICM trend: 05/10/2016       Kristi Billings, RN 05/10/2016 1:11 PM

## 2016-05-11 ENCOUNTER — Encounter: Payer: Self-pay | Admitting: Cardiology

## 2016-05-11 ENCOUNTER — Ambulatory Visit (INDEPENDENT_AMBULATORY_CARE_PROVIDER_SITE_OTHER): Payer: Medicare Other | Admitting: Cardiology

## 2016-05-11 VITALS — BP 110/58 | Ht 63.25 in | Wt 135.0 lb

## 2016-05-11 DIAGNOSIS — I429 Cardiomyopathy, unspecified: Secondary | ICD-10-CM

## 2016-05-11 DIAGNOSIS — I10 Essential (primary) hypertension: Secondary | ICD-10-CM

## 2016-05-11 DIAGNOSIS — Z950 Presence of cardiac pacemaker: Secondary | ICD-10-CM | POA: Diagnosis not present

## 2016-05-11 DIAGNOSIS — I480 Paroxysmal atrial fibrillation: Secondary | ICD-10-CM

## 2016-05-11 DIAGNOSIS — I428 Other cardiomyopathies: Secondary | ICD-10-CM

## 2016-05-11 MED ORDER — FUROSEMIDE 40 MG PO TABS: 40.0000 mg | ORAL_TABLET | Freq: Every day | ORAL | 3 refills | Status: DC

## 2016-05-11 NOTE — Progress Notes (Signed)
[facility]. [address], [address], [place]  [address] Phone: [phone] Fax:  [phone]  Date:  05/11/2016   ID:  Kristi Parker, DOB [birth year], MRN [id number]  PCP:  Irven Shelling, MD   History of Present Illness: Kristi Parker is a 65 y.o. female with nonischemic cardiomyopathy ejection fraction 25-30%, secondary pulmonary hypertension as a result of this, prior stroke over 10 years ago, atrial fibrillation on chronic anticoagulation, biventricular pacemaker 2009, moderate mitral regurgitation seen on TEE in 2011 here for followup. She's off of amiodarone because of persistent/chronic atrial fibrillation.  Still feeling some dizziness not as much.  NYHA 2. Doing exercise in morning. Two walks a day. Watching was she is eating. Occasional dizziness has improved with decreased medication dose. Made changes as below. Still decreasing her furosemide to 20 mg.  9/37/17-overall doing very well. Occasional mild shortness of breath with activity. She is exercising at curves. She now is affected. She and her daughter are in competition sometimes. She is taking her daily weights. The insurance company is helping her with that. She is doing a Technical sales engineer job.   Wt Readings from Last 3 Encounters:  05/11/16 135 lb (61.2 kg)  05/07/15 133 lb (60.3 kg)  01/05/15 135 lb 6.4 oz (61.4 kg)     Past Medical History:  Diagnosis Date  . Adenomatous colon polyp   . Atrial flutter    A fib/flutter DCCV x 2   (05/2008 & 12/2009)  . Cardiac pacemaker in situ 06/2008   CRT  . Cervical radiculopathy at C5   . Chronic systolic heart failure   . CKD (chronic kidney disease), stage III   . DDD (degenerative disc disease), cervical   . DDD (degenerative disc disease), lumbar   . Fibroid   . GERD (gastroesophageal reflux disease)   . Hearing loss 02/2010   L hearing loss/vertigo, steroids  . History of seizure disorder    Related Hx  . Left atrial enlargement    severe  .  Mitral regurgitation    moderate  . Multiple sclerosis 1984  . Nonischemic cardiomyopathy (HCC)    Moderate LVEF 35-40% by ECHO 2011, 25-30% by echo 2013  . Permanent atrial fibrillation   . Pulmonary hypertension, secondary 06/04/2013   As a result of nonischemic cardiomyopathy EF 25-30%  . Stroke    Right brain CVA, complete recovery 07/2003  . Vertigo 02/2010   L hearing loss/vertigo, steroids    Past Surgical History:  Procedure Laterality Date  . BIV PACEMAKER GENERATOR CHANGE OUT N/A 09/18/2014   Procedure: BIV PACEMAKER GENERATOR CHANGE OUT;  Surgeon: Thompson Grayer, MD;  Location: [facility] CATH LAB;  Service: Cardiovascular;  Laterality: N/A;  . DILATION AND CURETTAGE OF UTERUS    . HYSTEROSCOPY    . PACEMAKER INSERTION     PTVP 08/2001 for complete heart block. Upgrade PTVP to MDT BiV 06/2008 by Dr Leonia Reeves  . TUBAL LIGATION      Current Outpatient Prescriptions  Medication Sig Dispense Refill  . BIOTIN PO Take 1 capsule by mouth daily.    . Calcium Carbonate-Vitamin D (CALCIUM + D PO) Take 2 tablets by mouth daily.     . carvedilol (COREG) 12.5 MG tablet Take 1 tablet by mouth  twice a day 60 tablet 0  . COUMADIN 5 MG tablet TAKE AS DIRECTED BY THE COUMADIN CLINIC 30 tablet 3  . fexofenadine (ALLEGRA) 180 MG tablet Take 180 mg by mouth daily  as needed for allergies.     . furosemide (LASIX) 40 MG tablet Take 1 tablet (40 mg total) by mouth daily. May take extra 1/2 tablet as needed for swelling 90 tablet 3  . lisinopril (PRINIVIL,ZESTRIL) 2.5 MG tablet Take 1 tablet by mouth  daily 30 tablet 0  . loratadine (CLARITIN) 10 MG tablet Take 10 mg by mouth daily as needed for allergies.    [name] MAGNESIUM PO Take 1 tablet by mouth daily.    [name] spironolactone (ALDACTONE) 25 MG tablet Take 0.5 tablets (12.5 mg total) by mouth daily. Please keep scheduled 05/11/16 appt for further refills. 30 tablet 0   No current facility-administered medications for this visit.     Allergies:   No Known  Allergies  Social History:  The patient  reports that she has never smoked. She does not have any smokeless tobacco history on file. She reports that she drinks alcohol.   ROS:  Please see the history of present illness.  No syncope, no bleeding, no orthopnea, no PND. She still having occasional shortness of breath when bending over or walking up stairs. She's also having some dizziness at times. Could be secondary to hypotension.   All other systems reviewed and negative.   PHYSICAL EXAM: VS:  BP (!) 110/58   Ht 5' 3.25" (1.607 m)   Wt 135 lb (61.2 kg)   BMI 23.73 kg/m  Well nourished, well developed, in no acute distress HEENT: normal Neck: no JVD Cardiac:  normal S1, S2; RRR; soft systolic murmurLeft lower sternal Lungs:  clear to auscultation bilaterally, no wheezing, rhonchi or rales Abd: soft, nontender, no hepatomegaly Ext: no edema Skin: warm and drybruise back on shoulder Neuro: no focal abnormalities noted  EKG:  EKG performed today 05/11/16-V paced rhythm 67, no other abnormalities-prior Biventricular pacing rate 63, underlying atrial fibrillation, no change from prior.     ECHO 08/28/14: - Left ventricle: The cavity size was mildly dilated. Wall thickness was normal. Systolic function was mildly to moderatelyreduced. The estimated ejection fraction was in the range of 40%to 45%. Diffuse hypokinesis. The study is not technicallysufficient to allow evaluation of LV diastolic function: there isno identifiable atrial electrical or mechanical activity. Doppler parameters are consistent with elevated mean left atrial filling pressure. - Aortic valve: There was trivial regurgitation. - Mitral valve: There was mild regurgitation. - Left atrium: The atrium was moderately dilated. - Tricuspid valve: There was mild-moderate regurgitation directed centrally.  ASSESSMENT AND PLAN:  1. Nonischemic cardiomyopathy-with relative hypotension, I cut back her carvedilol from  25 mg twice a day down to 12.5 twice a day and I cut her lisinopril back from 5 mg once a day down to 2.5 mg once a day. Her husband has a blood pressure cuff and I would like her to monitor this. This was the likely reason for her occasional dizziness. However, she continues to feel this however more rarely then previous. furosemide 40 once a day. If she notices increasing weight, she can take an extra Lasix. 2. Pacemaker-functioning well. Dr. [name] note reviewed. 3. Atrial fibrillation-currently anticoagulated. Coumadin monitored. Prior stroke over 10 years ago.  4. Pulmonary hypertension, secondary-as a result of cardiomyopathy. Overall mild. Well controlled. 5. Moderate mitral regurgitation-murmur is not that impressive. Continue to monitor. Clinically doing well. Secondary treatment. Great job 6. We will see her back in one year. She is also seeing Dr. Laurann Montana as well.  Signed, Candee Furbish, MD [facility]  05/11/2016 12:04 PM

## 2016-05-11 NOTE — Patient Instructions (Signed)

## 2016-05-12 ENCOUNTER — Other Ambulatory Visit: Payer: Self-pay | Admitting: Cardiology

## 2016-05-26 ENCOUNTER — Ambulatory Visit (INDEPENDENT_AMBULATORY_CARE_PROVIDER_SITE_OTHER): Payer: Medicare Other | Admitting: *Deleted

## 2016-05-26 DIAGNOSIS — Z5181 Encounter for therapeutic drug level monitoring: Secondary | ICD-10-CM | POA: Diagnosis not present

## 2016-05-26 DIAGNOSIS — I4891 Unspecified atrial fibrillation: Secondary | ICD-10-CM

## 2016-05-26 LAB — POCT INR: INR: 2.3

## 2016-06-06 ENCOUNTER — Other Ambulatory Visit: Payer: Self-pay | Admitting: *Deleted

## 2016-06-06 MED ORDER — LISINOPRIL 2.5 MG PO TABS: 2.5000 mg | ORAL_TABLET | Freq: Every day | ORAL | 3 refills | Status: DC

## 2016-06-06 MED ORDER — SPIRONOLACTONE 25 MG PO TABS: 12.5000 mg | ORAL_TABLET | Freq: Every day | ORAL | 3 refills | Status: DC

## 2016-06-06 MED ORDER — CARVEDILOL 12.5 MG PO TABS: 12.5000 mg | ORAL_TABLET | Freq: Two times a day (BID) | ORAL | 3 refills | Status: DC

## 2016-06-06 MED ORDER — FUROSEMIDE 40 MG PO TABS: 40.0000 mg | ORAL_TABLET | Freq: Every day | ORAL | 3 refills | Status: DC

## 2016-06-10 ENCOUNTER — Ambulatory Visit (INDEPENDENT_AMBULATORY_CARE_PROVIDER_SITE_OTHER): Payer: Medicare Other

## 2016-06-10 ENCOUNTER — Telehealth: Payer: Self-pay

## 2016-06-10 DIAGNOSIS — I428 Other cardiomyopathies: Secondary | ICD-10-CM

## 2016-06-10 DIAGNOSIS — Z950 Presence of cardiac pacemaker: Secondary | ICD-10-CM

## 2016-06-10 NOTE — Progress Notes (Signed)
No ICM remote transmission received and scheduled next transmission for 06/29/2016.

## 2016-06-10 NOTE — Telephone Encounter (Signed)
Attempted ICM call to patient to request to send ICM remote transmission.  No answer and no answering machine.  Next ICM remote transmission scheduled for 06/29/2016

## 2016-06-29 ENCOUNTER — Ambulatory Visit (INDEPENDENT_AMBULATORY_CARE_PROVIDER_SITE_OTHER): Payer: Medicare Other

## 2016-06-29 DIAGNOSIS — Z950 Presence of cardiac pacemaker: Secondary | ICD-10-CM

## 2016-06-29 DIAGNOSIS — I428 Other cardiomyopathies: Secondary | ICD-10-CM

## 2016-06-30 NOTE — Progress Notes (Signed)
EPIC Encounter for ICM Monitoring  Patient Name: Kristi Parker is a 65 y.o. female Date: 06/30/2016 Primary Care Physican: Irven Shelling, MD Primary Cardiologist:Skains Electrophysiologist: Allred Dry Weight:  unknown Bi-V Pacing:  98%       Heart Failure questions reviewed, pt asymptomatic.  She reported she felt chest thumping while she was at the [employer] and felt weak as she was climbing stairs.  She stated it resolved in about 20 minutes.  She has a carelink smart monitor through the cell phone.  Advised if she any further episodes to send a transmission.    Thoracic impedance normal   Recommendations: No changes.  Advised to limit salt intake to 2000 mg daily.  Encouraged to call for fluid symptoms.    Follow-up plan: ICM clinic phone appointment on 08/01/2016.  Copy of ICM check sent to device physician.   ICM trend: 06/28/2016       [name], RN 06/30/2016 10:39 AM

## 2016-07-05 ENCOUNTER — Ambulatory Visit (INDEPENDENT_AMBULATORY_CARE_PROVIDER_SITE_OTHER): Payer: Medicare Other | Admitting: *Deleted

## 2016-07-05 DIAGNOSIS — I4891 Unspecified atrial fibrillation: Secondary | ICD-10-CM

## 2016-07-05 DIAGNOSIS — Z5181 Encounter for therapeutic drug level monitoring: Secondary | ICD-10-CM | POA: Diagnosis not present

## 2016-07-05 LAB — POCT INR: INR: 2.5

## 2016-07-15 ENCOUNTER — Other Ambulatory Visit: Payer: Self-pay | Admitting: Cardiology

## 2016-07-15 ENCOUNTER — Ambulatory Visit (INDEPENDENT_AMBULATORY_CARE_PROVIDER_SITE_OTHER): Payer: Medicare Other | Admitting: *Deleted

## 2016-07-15 DIAGNOSIS — I442 Atrioventricular block, complete: Secondary | ICD-10-CM

## 2016-07-15 NOTE — Telephone Encounter (Signed)
At last visit, she was complaining of dizziness, low blood pressure. I wonder if she was having another episode that was causing her weakness. We cut her carvedilol back at last visit and I would like to cut this back again to 6.25 mg twice a day.  Kristi Furbish, MD

## 2016-07-15 NOTE — Progress Notes (Signed)
Remote pacemaker transmission.   

## 2016-07-15 NOTE — Telephone Encounter (Signed)
New Message  Patient c/o Palpitations:  High priority if patient c/o lightheadedness and shortness of breath.  1. How long have you been having palpitations? Yesterday  2. Are you currently experiencing lightheadedness and shortness of breath? Lightheadedness, weak, and SOB.  3. Have you checked your BP and heart rate? (document readings) No, but can send transmission  4. Are you experiencing any other symptoms? No  Pt voiced she had problem before she was told to call. Pt voiced it fluttered and it never happened at that capacity and it scared her.  It didn't happen until pt got out of the car from traveling.

## 2016-07-15 NOTE — Telephone Encounter (Signed)
Attempted to call pt with medication change-NA.  Will continue to attempt to reach her.

## 2016-07-15 NOTE — Telephone Encounter (Signed)
Patient called to report that two days ago, she experienced some heart fluttering that quickly subsided.  She left yesterday morning to visit her father in [place] - she travelled in a vehicle but was not the driver. When she arrived to her father's house, her heart started fluttering "really bad" and she was weaker than usual. She also had some dull chest pressure that radiated L to R during the episode. She denies any SOB. She managed to get upstairs to her bedroom and lie down. Throughout the night, the fluttering continued but "wasn't as bad." She reports the fluttering was better or worse depending on what position she was in. Early this AM, she woke up and had to use a bowel movement but could barely get up she was so weak. She managed to get back to sleep. When she woke up this AM, the symptoms were gone. She currently has no fluttering, chest discomfort, or any other symptoms. She is very anxious and is nervous to move because she doesn't want the fluttering to come back.  Confirmed with the patient she is taking her medications as directed. She has no VS to report. She states she will send a transmission and awaits further recommendations from the Device Clinic/Dr. Skains.

## 2016-07-18 NOTE — Telephone Encounter (Signed)
Left a message for the pt to call back to endorse Dr Marlou Porch recommendations for the pt to decrease her carvedilol to 6.25 mg po bid, for complaints mentioned.  Left a message on the pts cell number, as requested.

## 2016-07-18 NOTE — Telephone Encounter (Signed)
LMTCB for pt 

## 2016-07-18 NOTE — Telephone Encounter (Signed)
F/u message ° °Pt returning RN call .please call back to discuss  °

## 2016-07-19 NOTE — Telephone Encounter (Signed)
Called home number.  Her husband stated that she is still visiting her father and she left her cell number for [place] to call her back on.  [phone].  Called pt's cell number, left detailed message for recommended medication change to decrease carvedilol to 6.25 mg twice daily.  Advised patient to call back or send My Chart message that she received and understood the message, or let us know if she has any questions.  Pt has appointment with Dr. Marlou Porch on 07/21/16.

## 2016-07-21 ENCOUNTER — Ambulatory Visit (INDEPENDENT_AMBULATORY_CARE_PROVIDER_SITE_OTHER): Payer: Medicare Other | Admitting: Cardiology

## 2016-07-21 ENCOUNTER — Ambulatory Visit (INDEPENDENT_AMBULATORY_CARE_PROVIDER_SITE_OTHER): Payer: Medicare Other | Admitting: *Deleted

## 2016-07-21 ENCOUNTER — Encounter: Payer: Self-pay | Admitting: Cardiology

## 2016-07-21 VITALS — BP 112/68 | HR 69 | Ht 63.25 in | Wt 139.4 lb

## 2016-07-21 DIAGNOSIS — I420 Dilated cardiomyopathy: Secondary | ICD-10-CM | POA: Diagnosis not present

## 2016-07-21 DIAGNOSIS — I442 Atrioventricular block, complete: Secondary | ICD-10-CM | POA: Diagnosis not present

## 2016-07-21 DIAGNOSIS — I482 Chronic atrial fibrillation: Secondary | ICD-10-CM | POA: Diagnosis not present

## 2016-07-21 DIAGNOSIS — I480 Paroxysmal atrial fibrillation: Secondary | ICD-10-CM

## 2016-07-21 DIAGNOSIS — I4821 Permanent atrial fibrillation: Secondary | ICD-10-CM

## 2016-07-21 DIAGNOSIS — Z950 Presence of cardiac pacemaker: Secondary | ICD-10-CM

## 2016-07-21 LAB — CUP PACEART INCLINIC DEVICE CHECK
Battery Remaining Longevity: 64 mo
Battery Voltage: 3.01 V
Brady Statistic AP VP Percent: 0 %
Brady Statistic AP VS Percent: 0 %
Brady Statistic AS VP Percent: 94.54 %
Brady Statistic AS VS Percent: 5.46 %
Brady Statistic RA Percent Paced: 0 %
Brady Statistic RV Percent Paced: 96.33 %
Date Time Interrogation Session: 20171207104801
Implantable Lead Implant Date: 20030107
Implantable Lead Implant Date: 20091104
Implantable Lead Implant Date: 20091104
Implantable Lead Location: 753858
Implantable Lead Location: 753859
Implantable Lead Location: 753860
Implantable Lead Model: 4196
Implantable Lead Model: 4458
Implantable Lead Model: 5076
Implantable Lead Serial Number: 302739
Implantable Pulse Generator Implant Date: 20160204
Lead Channel Impedance Value: 1064 Ohm
Lead Channel Impedance Value: 285 Ohm
Lead Channel Impedance Value: 285 Ohm
Lead Channel Impedance Value: 342 Ohm
Lead Channel Impedance Value: 380 Ohm
Lead Channel Impedance Value: 570 Ohm
Lead Channel Impedance Value: 627 Ohm
Lead Channel Impedance Value: 646 Ohm
Lead Channel Impedance Value: 722 Ohm
Lead Channel Pacing Threshold Amplitude: 0.75 V
Lead Channel Pacing Threshold Amplitude: 1.5 V
Lead Channel Pacing Threshold Pulse Width: 0.4 ms
Lead Channel Pacing Threshold Pulse Width: 0.6 ms
Lead Channel Sensing Intrinsic Amplitude: 0.25 mV
Lead Channel Sensing Intrinsic Amplitude: 0.25 mV
Lead Channel Sensing Intrinsic Amplitude: 13.25 mV
Lead Channel Sensing Intrinsic Amplitude: 13.25 mV
Lead Channel Setting Pacing Amplitude: 2.5 V
Lead Channel Setting Pacing Amplitude: 2.5 V
Lead Channel Setting Pacing Amplitude: 2.5 V
Lead Channel Setting Pacing Pulse Width: 0.4 ms
Lead Channel Setting Pacing Pulse Width: 0.6 ms
Lead Channel Setting Sensing Sensitivity: 5.6 mV

## 2016-07-21 MED ORDER — CARVEDILOL 12.5 MG PO TABS: 6.2500 mg | ORAL_TABLET | Freq: Two times a day (BID) | ORAL | 3 refills | Status: DC

## 2016-07-21 MED ORDER — AMIODARONE HCL 200 MG PO TABS: 200.0000 mg | ORAL_TABLET | Freq: Two times a day (BID) | ORAL | 3 refills | Status: DC

## 2016-07-21 MED ORDER — AMIODARONE HCL 200 MG PO TABS: 200.0000 mg | ORAL_TABLET | Freq: Two times a day (BID) | ORAL | 6 refills | Status: DC

## 2016-07-21 NOTE — Progress Notes (Signed)
CRT-P device check in clinic. Normal device function. Thresholds, sensing, impedance consistent with previous measurements. Histograms appropriate for patient and level of activity. 70.9% AT/AF burden + warfarin, patient is no longer in AF. Device reprogrammed to DDDR, RA output 2.5V @0 .13ms per AS. (31) ventricular high rate episodes--max dur. 11hours + pt sx's---sustained VT likely per AS; Amio started/echo ordered. Patient bi-ventricularly pacing 99.7% of the time with 3.4%VSRp. Device programmed with appropriate safety margins. Device heart failure diagnostics are within normal limits and stable over time. Estimated longevity 5 years. Patient enrolled in remote follow-up. Plan to follow up with JA first available.

## 2016-07-21 NOTE — Patient Instructions (Signed)
Medication Instructions:  Please start Amiodarone 200 mg one tablet twice a day. Continue to take Coreg (carvedilol) 12.5 mg 1/2 tablet twice a day. Continue all other medications as listed   Testing/Procedures: Your physician has requested that you have an echocardiogram. Echocardiography is a painless test that uses sound waves to create images of your heart. It provides your doctor with information about the size and shape of your heart and how well your heart's chambers and valves are working. This procedure takes approximately one hour. There are no restrictions for this procedure.  Follow-Up: Schedule to see Dr Rayann Heman after the echocardiogram has been completed.  Follow up with Dr Marlou Porch in 2 to 3 months.  If you need a refill on your cardiac medications before your next appointment, please call your pharmacy.  Thank you for choosing [facility]!!

## 2016-07-21 NOTE — Telephone Encounter (Signed)
Pt seen in the office today by Dr Marlou Porch.  Please see that office visit note.

## 2016-07-21 NOTE — Progress Notes (Signed)
[facility]. [address] [place] [address], [place]  [address] Phone: [phone] Fax:  [phone]  Date:  07/21/2016   ID:  Kristi Parker, DOB 1951-05-15, MRN [id number]  PCP:  Irven Shelling, MD   History of Present Illness: Kristi Parker is a 65 y.o. female with nonischemic cardiomyopathy ejection fraction previously 25-30%, most recently 40-45%, secondary pulmonary hypertension prior stroke over 10 years ago, paroxysmal atrial fibrillation on chronic anticoagulation, biventricular pacemaker 2009 placed by Dr. Leonia Reeves, mild mitral regurgitation here for followup. She had been off of amiodarone because of permanent atrial fibrillation.  Recently however she's been having episodes of rapid tachycardia cardia palpitations with associated dizziness, shortness of breath. She experiences once at a [employer] and struggled getting into the stadium. She also experienced this when trying to get paper plates from her pantry. Minimal activity. This has been concerning to her. I recent interrogation of her defibrillator demonstrated episodes of ventricular tachycardia, longest of which was 11 hours duration.  We interrogated her pacemaker today at the assistance of amber and electrophysiology team and this did in fact confirm episodes of sustained ventricular tachycardia.  Most recent echocardiogram was in 2016 and showed EF of 40-45%.  She has battled with hypotension in the past as well and was on carvedilol 12.5 mg twice a day. Approximate a week ago she called with another episode of dizziness and was instructed to decrease her carvedilol to 6.25 mg twice a day. This was before we understood that she was in paroxysmal ventricular tachycardia.    Wt Readings from Last 3 Encounters:  07/21/16 139 lb 6.4 oz (63.2 kg)  05/11/16 135 lb (61.2 kg)  05/07/15 133 lb (60.3 kg)     Past Medical History:  Diagnosis Date  . Adenomatous colon polyp   . Atrial flutter ([place])     A fib/flutter DCCV x 2   (05/2008 & 12/2009)  . Cardiac pacemaker in situ 06/2008   CRT  . Cervical radiculopathy at C5   . Chronic systolic heart failure ([place])   . CKD (chronic kidney disease), stage III   . DDD (degenerative disc disease), cervical   . DDD (degenerative disc disease), lumbar   . Fibroid   . GERD (gastroesophageal reflux disease)   . Hearing loss 02/2010   L hearing loss/vertigo, steroids  . History of seizure disorder    Related Hx  . Left atrial enlargement    severe  . Mitral regurgitation    moderate  . Multiple sclerosis ([place]) 1984  . Nonischemic cardiomyopathy (HCC)    Moderate LVEF 35-40% by ECHO 2011, 25-30% by echo 2013  . Permanent atrial fibrillation ([place])   . Pulmonary hypertension, secondary 06/04/2013   As a result of nonischemic cardiomyopathy EF 25-30%  . Stroke [facility])    Right brain CVA, complete recovery 07/2003  . Vertigo 02/2010   L hearing loss/vertigo, steroids    Past Surgical History:  Procedure Laterality Date  . BIV PACEMAKER GENERATOR CHANGE OUT N/A 09/18/2014   Procedure: BIV PACEMAKER GENERATOR CHANGE OUT;  Surgeon: Thompson Grayer, MD;  Location: [facility] CATH LAB;  Service: Cardiovascular;  Laterality: N/A;  . DILATION AND CURETTAGE OF UTERUS    . HYSTEROSCOPY    . PACEMAKER INSERTION     PTVP 08/2001 for complete heart block. Upgrade PTVP to MDT BiV 06/2008 by Dr Leonia Reeves  . TUBAL LIGATION      Current Outpatient Prescriptions  Medication Sig Dispense  Refill  . BIOTIN PO Take 1 capsule by mouth daily.    . Calcium Carbonate-Vitamin D (CALCIUM + D PO) Take 2 tablets by mouth daily.     . carvedilol (COREG) 12.5 MG tablet Take 1 tablet (12.5 mg total) by mouth 2 (two) times daily. 180 tablet 3  . COUMADIN 5 MG tablet TAKE AS DIRECTED BY THE COUMADIN CLINIC 30 tablet 3  . fexofenadine (ALLEGRA) 180 MG tablet Take 180 mg by mouth daily as needed for allergies.     . furosemide (LASIX) 40 MG tablet Take 1 tablet (40 mg total) by mouth  daily. May take extra 1/2 tablet as needed for swelling 105 tablet 3  . lisinopril (PRINIVIL,ZESTRIL) 2.5 MG tablet Take 1 tablet (2.5 mg total) by mouth daily. 90 tablet 3  . loratadine (CLARITIN) 10 MG tablet Take 10 mg by mouth daily as needed for allergies.    [name] MAGNESIUM PO Take 1 tablet by mouth daily.    [name] spironolactone (ALDACTONE) 25 MG tablet Take 0.5 tablets (12.5 mg total) by mouth daily. 45 tablet 3  . amiodarone (PACERONE) 200 MG tablet Take 1 tablet (200 mg total) by mouth 2 (two) times daily. 180 tablet 3   No current facility-administered medications for this visit.     Allergies:   No Known Allergies  Social History:  The patient  reports that she has never smoked. She does not have any smokeless tobacco history on file. She reports that she drinks alcohol.   ROS:  Please see the history of present illness.  No syncope, no bleeding, no orthopnea, no PND. She still having occasional shortness of breath when bending over or walking up stairs. She's also having some dizziness at times. Could be secondary to hypotension.   All other systems reviewed and negative.   PHYSICAL EXAM: VS:  BP 112/68   Pulse 69   Ht 5' 3.25" (1.607 m)   Wt 139 lb 6.4 oz (63.2 kg)   LMP  (LMP Unknown)   BMI 24.50 kg/m  Well nourished, well developed, in no acute distress  HEENT: normal  Neck: no JVD  Cardiac:  normal S1, S2; RRR; soft systolic murmur Left lower sternal Lungs:  clear to auscultation bilaterally, no wheezing, rhonchi or rales  Abd: soft, nontender, no hepatomegaly  Ext: no edema  Skin: warm and dry bruise back on shoulder Neuro: no focal abnormalities noted  EKG:  EKG performed today 07/21/16-ventricular pacing, currently in sinus rhythm verified by defibrillator interrogation AV pacing rate 69 bpm. Personally viewed-prior 05/11/16-V paced rhythm 67, no other abnormalities-prior Biventricular pacing rate 63, underlying atrial fibrillation, no change from prior.     ECHO  08/28/14: - Left ventricle: The cavity size was mildly dilated. Wall thickness was normal. Systolic function was mildly to moderatelyreduced. The estimated ejection fraction was in the range of 40%to 45%. Diffuse hypokinesis. The study is not technicallysufficient to allow evaluation of LV diastolic function: there isno identifiable atrial electrical or mechanical activity. Doppler parameters are consistent with elevated mean left atrial filling pressure. - Aortic valve: There was trivial regurgitation. - Mitral valve: There was mild regurgitation. - Left atrium: The atrium was moderately dilated. - Tricuspid valve: There was mild-moderate regurgitation directed centrally.  ASSESSMENT AND PLAN:  1. Nonischemic cardiomyopathy-with relative hypotension, I cut back her carvedilol from 25 mg twice a day down to 12.5 twice a day bacjk in 2014 when she had hypotension and I cut her lisinopril back from 5 mg once  a day down to 2.5 mg once a day.  2. Medtronic CRT Pacemaker-Dr. Allred's. Interrogated today demonstrating ventricular tachycardia episodes. 3. Sustained ventricular tachycardia-starting amiodarone 200 mg twice a day area close follow-up with electrophysiology, Dr. Rayann Heman. Checking echocardiogram. May need potential upgrade to ICD. Could possibly be a candidate for VT ablation in the future if amiodarone not working. Also, she may need to increase her carvedilol back to 12.5 mg twice a day if her blood pressure is able to tolerate this. She understands to seek medical attention if symptoms become more worrisome. 4. Atrial fibrillation-currently anticoagulated. Coumadin monitored. Prior stroke over 10 years ago. Currently in sinus rhythm. 5. Pulmonary hypertension, secondary-as a result of cardiomyopathy. Overall mild. Well controlled. 6. Mild mitral regurgitation-murmur is not that impressive. Continue to monitor. 7. We will see her back in 2-3 months. She will be seeing Dr. Rayann Heman  in 1-2 weeks.  Signed, Candee Furbish, MD [facility]  07/21/2016 10:12 AM

## 2016-07-26 ENCOUNTER — Encounter: Payer: Self-pay | Admitting: Cardiology

## 2016-07-27 ENCOUNTER — Inpatient Hospital Stay (HOSPITAL_COMMUNITY): Payer: Medicare Other

## 2016-07-27 ENCOUNTER — Telehealth: Payer: Self-pay | Admitting: Cardiology

## 2016-07-27 ENCOUNTER — Other Ambulatory Visit: Payer: Self-pay

## 2016-07-27 ENCOUNTER — Inpatient Hospital Stay (HOSPITAL_COMMUNITY)
Admission: EM | Admit: 2016-07-27 | Discharge: 2016-07-29 | DRG: 245 | Disposition: A | Discharge status: Home / Self Care | Payer: Medicare Other | Attending: Internal Medicine | Admitting: Internal Medicine

## 2016-07-27 ENCOUNTER — Encounter (HOSPITAL_COMMUNITY): Payer: Self-pay | Admitting: *Deleted

## 2016-07-27 DIAGNOSIS — I472 Ventricular tachycardia, unspecified: Secondary | ICD-10-CM

## 2016-07-27 DIAGNOSIS — N183 Chronic kidney disease, stage 3 (moderate): Secondary | ICD-10-CM | POA: Diagnosis present

## 2016-07-27 DIAGNOSIS — Z7901 Long term (current) use of anticoagulants: Secondary | ICD-10-CM

## 2016-07-27 DIAGNOSIS — Z833 Family history of diabetes mellitus: Secondary | ICD-10-CM | POA: Diagnosis not present

## 2016-07-27 DIAGNOSIS — I349 Nonrheumatic mitral valve disorder, unspecified: Secondary | ICD-10-CM

## 2016-07-27 DIAGNOSIS — I442 Atrioventricular block, complete: Secondary | ICD-10-CM | POA: Diagnosis present

## 2016-07-27 DIAGNOSIS — I481 Persistent atrial fibrillation: Secondary | ICD-10-CM | POA: Diagnosis present

## 2016-07-27 DIAGNOSIS — Z8249 Family history of ischemic heart disease and other diseases of the circulatory system: Secondary | ICD-10-CM

## 2016-07-27 DIAGNOSIS — Z820 Family history of epilepsy and other diseases of the nervous system: Secondary | ICD-10-CM

## 2016-07-27 DIAGNOSIS — K219 Gastro-esophageal reflux disease without esophagitis: Secondary | ICD-10-CM | POA: Diagnosis not present

## 2016-07-27 DIAGNOSIS — I340 Nonrheumatic mitral (valve) insufficiency: Secondary | ICD-10-CM | POA: Diagnosis present

## 2016-07-27 DIAGNOSIS — I5022 Chronic systolic (congestive) heart failure: Secondary | ICD-10-CM | POA: Diagnosis not present

## 2016-07-27 DIAGNOSIS — I4892 Unspecified atrial flutter: Secondary | ICD-10-CM | POA: Diagnosis not present

## 2016-07-27 DIAGNOSIS — I272 Pulmonary hypertension, unspecified: Secondary | ICD-10-CM | POA: Diagnosis not present

## 2016-07-27 DIAGNOSIS — G35 Multiple sclerosis: Secondary | ICD-10-CM | POA: Diagnosis present

## 2016-07-27 DIAGNOSIS — I482 Chronic atrial fibrillation: Secondary | ICD-10-CM | POA: Diagnosis present

## 2016-07-27 DIAGNOSIS — Z95810 Presence of automatic (implantable) cardiac defibrillator: Secondary | ICD-10-CM

## 2016-07-27 DIAGNOSIS — I429 Cardiomyopathy, unspecified: Secondary | ICD-10-CM | POA: Diagnosis present

## 2016-07-27 LAB — COMPREHENSIVE METABOLIC PANEL
ALT: 25 U/L (ref 14–54)
AST: 19 U/L (ref 15–41)
Albumin: 4.1 g/dL (ref 3.5–5.0)
Alkaline Phosphatase: 106 U/L (ref 38–126)
Anion gap: 11 (ref 5–15)
BUN: 18 mg/dL (ref 6–20)
CO2: 24 mmol/L (ref 22–32)
Calcium: 9.8 mg/dL (ref 8.9–10.3)
Chloride: 105 mmol/L (ref 101–111)
Creatinine, Ser: 1.88 mg/dL — ABNORMAL HIGH (ref 0.44–1.00)
GFR calc Af Amer: 31 mL/min — ABNORMAL LOW (ref >60)
GFR calc non Af Amer: 27 mL/min — ABNORMAL LOW (ref >60)
Glucose, Bld: 130 mg/dL — ABNORMAL HIGH (ref 65–99)
Potassium: 3.9 mmol/L (ref 3.5–5.1)
Sodium: 140 mmol/L (ref 135–145)
Total Bilirubin: 2.3 mg/dL — ABNORMAL HIGH (ref 0.3–1.2)
Total Protein: 6.9 g/dL (ref 6.5–8.1)

## 2016-07-27 LAB — ECHOCARDIOGRAM COMPLETE
E decel time: 194 msec
FS: 16 % — AB (ref 28–44)
Height: 63.25 in
IVS/LV PW RATIO, ED: 0.73
LA ID, A-P, ES: 43 mm
LA diam end sys: 43 mm
LA diam index: 2.61 cm/m2
LA vol A4C: 78.5 ml
LV PW d: 11 mm — AB (ref 0.6–1.1)
LV e' LATERAL: 6.85 cm/s
LVOT area: 2.01 cm2
LVOT diameter: 16 mm
Lateral S' vel: 7.83 cm/s
MV Dec: 194
MV pk E vel: 3.2 m/s
RV sys press: 45 mmHg
Reg peak vel: 303 cm/s
TAPSE: 11.5 mm
TDI e' lateral: 6.85
TDI e' medial: 5.98
TR max vel: 303 cm/s
Weight: 2194.02 oz

## 2016-07-27 LAB — CBC WITH DIFFERENTIAL/PLATELET
Basophils Absolute: 0 10*3/uL (ref 0.0–0.1)
Basophils Relative: 0 %
Eosinophils Absolute: 0.1 10*3/uL (ref 0.0–0.7)
Eosinophils Relative: 1 %
HCT: 34.7 % — ABNORMAL LOW (ref 36.0–46.0)
Hemoglobin: 11.5 g/dL — ABNORMAL LOW (ref 12.0–15.0)
Lymphocytes Relative: 24 %
Lymphs Abs: 1.2 10*3/uL (ref 0.7–4.0)
MCH: 30.2 pg (ref 26.0–34.0)
MCHC: 33.1 g/dL (ref 30.0–36.0)
MCV: 91.1 fL (ref 78.0–100.0)
Monocytes Absolute: 0.4 10*3/uL (ref 0.1–1.0)
Monocytes Relative: 8 %
Neutro Abs: 3.5 10*3/uL (ref 1.7–7.7)
Neutrophils Relative %: 67 %
Platelets: 165 10*3/uL (ref 150–400)
RBC: 3.81 MIL/uL — ABNORMAL LOW (ref 3.87–5.11)
RDW: 14.5 % (ref 11.5–15.5)
WBC: 5.2 10*3/uL (ref 4.0–10.5)

## 2016-07-27 LAB — BRAIN NATRIURETIC PEPTIDE: B Natriuretic Peptide: 281.5 pg/mL — ABNORMAL HIGH (ref 0.0–100.0)

## 2016-07-27 LAB — PHOSPHORUS: Phosphorus: 3.7 mg/dL (ref 2.5–4.6)

## 2016-07-27 LAB — MAGNESIUM: Magnesium: 2.1 mg/dL (ref 1.7–2.4)

## 2016-07-27 LAB — PROTIME-INR
INR: 2.65
Prothrombin Time: 28.8 seconds — ABNORMAL HIGH (ref 11.4–15.2)

## 2016-07-27 LAB — TROPONIN I: Troponin I: 0.03 ng/mL (ref <0.03)

## 2016-07-27 LAB — MRSA PCR SCREENING: MRSA by PCR: NEGATIVE

## 2016-07-27 LAB — TSH: TSH: 4.991 u[IU]/mL — ABNORMAL HIGH (ref 0.350–4.500)

## 2016-07-27 MED ORDER — SODIUM CHLORIDE 0.9 % IV SOLN: 250.0000 mL | INTRAVENOUS | Status: DC | PRN

## 2016-07-27 MED ORDER — FUROSEMIDE 40 MG PO TABS: 40.0000 mg | ORAL_TABLET | Freq: Every day | ORAL | Status: DC

## 2016-07-27 MED ORDER — CARVEDILOL 6.25 MG PO TABS
6.2500 mg | ORAL_TABLET | Freq: Two times a day (BID) | ORAL | Status: DC
  Administered 2016-07-27 – 2016-07-29 (×4): 6.25 mg via ORAL
  Filled 2016-07-27 (×4): qty 1

## 2016-07-27 MED ORDER — AMIODARONE IV BOLUS ONLY 150 MG/100ML
150.0000 mg | Freq: Once | INTRAVENOUS | Status: AC
  Administered 2016-07-28: 150 mg via INTRAVENOUS
  Filled 2016-07-27: qty 100

## 2016-07-27 MED ORDER — AMIODARONE HCL IN DEXTROSE 360-4.14 MG/200ML-% IV SOLN
60.0000 mg/h | INTRAVENOUS | Status: AC
  Administered 2016-07-28 (×2): 60 mg/h via INTRAVENOUS
  Filled 2016-07-27: qty 200

## 2016-07-27 MED ORDER — AMIODARONE LOAD VIA INFUSION
150.0000 mg | Freq: Once | INTRAVENOUS | Status: DC
  Filled 2016-07-27: qty 83.34

## 2016-07-27 MED ORDER — FUROSEMIDE 40 MG PO TABS
40.0000 mg | ORAL_TABLET | Freq: Every day | ORAL | Status: DC
  Administered 2016-07-27 – 2016-07-29 (×2): 40 mg via ORAL
  Filled 2016-07-27 (×3): qty 1

## 2016-07-27 MED ORDER — AMIODARONE HCL IN DEXTROSE 360-4.14 MG/200ML-% IV SOLN
60.0000 mg/h | INTRAVENOUS | Status: DC
  Administered 2016-07-27 (×2): 60 mg/h via INTRAVENOUS
  Filled 2016-07-27: qty 200

## 2016-07-27 MED ORDER — SPIRONOLACTONE 25 MG PO TABS
12.5000 mg | ORAL_TABLET | Freq: Every day | ORAL | Status: DC
  Administered 2016-07-29: 12.5 mg via ORAL
  Filled 2016-07-27 (×2): qty 1

## 2016-07-27 MED ORDER — WARFARIN SODIUM 2.5 MG PO TABS
2.5000 mg | ORAL_TABLET | ORAL | Status: DC
  Administered 2016-07-27: 2.5 mg via ORAL
  Filled 2016-07-27: qty 1

## 2016-07-27 MED ORDER — SODIUM CHLORIDE 0.9% FLUSH: 3.0000 mL | INTRAVENOUS | Status: DC | PRN

## 2016-07-27 MED ORDER — ACETAMINOPHEN 325 MG PO TABS: 650.0000 mg | ORAL_TABLET | ORAL | Status: DC | PRN

## 2016-07-27 MED ORDER — AMIODARONE LOAD VIA INFUSION
150.0000 mg | Freq: Once | INTRAVENOUS | Status: AC
  Administered 2016-07-28: 150 mg via INTRAVENOUS
  Filled 2016-07-27: qty 83.34

## 2016-07-27 MED ORDER — ONDANSETRON HCL 4 MG/2ML IJ SOLN: 4.0000 mg | Freq: Four times a day (QID) | INTRAMUSCULAR | Status: DC | PRN

## 2016-07-27 MED ORDER — AMIODARONE HCL IN DEXTROSE 360-4.14 MG/200ML-% IV SOLN
30.0000 mg/h | INTRAVENOUS | Status: DC
  Administered 2016-07-27: 30 mg/h via INTRAVENOUS
  Filled 2016-07-27: qty 200

## 2016-07-27 MED ORDER — SODIUM CHLORIDE 0.9 % IV BOLUS (SEPSIS)
500.0000 mL | Freq: Once | INTRAVENOUS | Status: AC
  Administered 2016-07-27: 500 mL via INTRAVENOUS

## 2016-07-27 MED ORDER — SODIUM CHLORIDE 0.9% FLUSH: 3.0000 mL | Freq: Two times a day (BID) | INTRAVENOUS | Status: DC

## 2016-07-27 MED ORDER — LISINOPRIL 2.5 MG PO TABS
2.5000 mg | ORAL_TABLET | Freq: Every day | ORAL | Status: DC
  Administered 2016-07-28 – 2016-07-29 (×2): 2.5 mg via ORAL
  Filled 2016-07-27 (×2): qty 1

## 2016-07-27 MED ORDER — WARFARIN SODIUM 2.5 MG PO TABS: 2.5000 mg | ORAL_TABLET | ORAL | Status: DC

## 2016-07-27 MED ORDER — WARFARIN - PHARMACIST DOSING INPATIENT
Freq: Every day | Status: DC
  Administered 2016-07-27: 18:00:00

## 2016-07-27 MED ORDER — AMIODARONE HCL IN DEXTROSE 360-4.14 MG/200ML-% IV SOLN
30.0000 mg/h | INTRAVENOUS | Status: DC
  Administered 2016-07-28 (×3): 30 mg/h via INTRAVENOUS
  Filled 2016-07-27 (×2): qty 200

## 2016-07-27 NOTE — ED Provider Notes (Signed)
[facility] DEPT Provider Note   CSN: [id number] Arrival date & time: 07/27/16  6734     History   Chief Complaint Chief Complaint  Patient presents with  . Palpitations  . Shortness of Breath    HPI Kristi Parker is a 65 y.o. female.  HPI Patient presents to the emergency department after having intermittent palpitations since last night.  She has a history of atrial flutter as well as a history of congestive heart failure.  She has a pacemaker.  She felt weak and tired this morning and developed severe palpitations without syncope at 4 AM.  Her rhythm strip was sent via her pacemaker to her cardiology team who noted that she was in ventricular tachycardia and they recommended that she come to the ER for evaluation.  She is currently on oral amiodarone and is compliant with her medications.  She reports no chest pain or shortness of breath at this time but does feel slightly weak.  She is not lightheaded.  She is pacemaker dependent   Past Medical History:  Diagnosis Date  . Adenomatous colon polyp   . Atrial flutter ([place])    A fib/flutter DCCV x 2   (05/2008 & 12/2009)  . Cardiac pacemaker in situ 06/2008   CRT  . Cervical radiculopathy at C5   . Chronic systolic heart failure ([place])   . CKD (chronic kidney disease), stage III   . DDD (degenerative disc disease), cervical   . DDD (degenerative disc disease), lumbar   . Fibroid   . GERD (gastroesophageal reflux disease)   . Hearing loss 02/2010   L hearing loss/vertigo, steroids  . History of seizure disorder    Related Hx  . Left atrial enlargement    severe  . Mitral regurgitation    moderate  . Multiple sclerosis ([place]) 1984  . Nonischemic cardiomyopathy (HCC)    Moderate LVEF 35-40% by ECHO 2011, 25-30% by echo 2013  . Permanent atrial fibrillation ([place])   . Pulmonary hypertension, secondary 06/04/2013   As a result of nonischemic cardiomyopathy EF 25-30%  . Stroke [facility])    Right brain CVA, complete  recovery 07/2003  . Vertigo 02/2010   L hearing loss/vertigo, steroids    Patient Active Problem List   Diagnosis Date Noted  . Nonischemic cardiomyopathy ([place]) 09/19/2013  . Encounter for therapeutic drug monitoring 09/19/2013  . Pulmonary hypertension, secondary 06/04/2013  . Fibroid   . MULTIPLE SCLEROSIS 01/12/2010  . HYPERTENSION 01/12/2010  . CARDIOMYOPATHY 01/12/2010  . AV BLOCK, COMPLETE 01/12/2010  . Atrial fibrillation ([place]) 01/12/2010  . Atrial flutter ([place]) 01/12/2010  . PACEMAKER, PERMANENT 01/12/2010    Past Surgical History:  Procedure Laterality Date  . BIV PACEMAKER GENERATOR CHANGE OUT N/A 09/18/2014   Procedure: BIV PACEMAKER GENERATOR CHANGE OUT;  Surgeon: Thompson Grayer, MD;  Location: [facility] CATH LAB;  Service: Cardiovascular;  Laterality: N/A;  . DILATION AND CURETTAGE OF UTERUS    . HYSTEROSCOPY    . PACEMAKER INSERTION     PTVP 08/2001 for complete heart block. Upgrade PTVP to MDT BiV 06/2008 by Dr Leonia Reeves  . TUBAL LIGATION      OB History    Gravida Para Term Preterm AB Living   _0 SAB TAB Ectopic Multiple Live Births                   Home Medications    Prior to Admission medications   Medication  Sig Start Date End Date Taking? Authorizing Provider  amiodarone (PACERONE) 200 MG tablet Take 1 tablet (200 mg total) by mouth 2 (two) times daily. 07/21/16   Jerline Pain, MD  BIOTIN PO Take 1 capsule by mouth daily.    Historical Provider, MD  Calcium Carbonate-Vitamin D (CALCIUM + D PO) Take 2 tablets by mouth daily.     Historical Provider, MD  carvedilol (COREG) 12.5 MG tablet Take 0.5 tablets (6.25 mg total) by mouth 2 (two) times daily. 07/21/16   Jerline Pain, MD  COUMADIN 5 MG tablet TAKE AS DIRECTED BY THE COUMADIN CLINIC 02/25/16   Jerline Pain, MD  fexofenadine (ALLEGRA) 180 MG tablet Take 180 mg by mouth daily as needed for allergies.     Historical Provider, MD  furosemide (LASIX) 40 MG tablet Take 1 tablet (40 mg total) by mouth  daily. May take extra 1/2 tablet as needed for swelling 06/06/16   Jerline Pain, MD  lisinopril (PRINIVIL,ZESTRIL) 2.5 MG tablet Take 1 tablet (2.5 mg total) by mouth daily. 06/06/16   Jerline Pain, MD  loratadine (CLARITIN) 10 MG tablet Take 10 mg by mouth daily as needed for allergies.    Historical Provider, MD  MAGNESIUM PO Take 1 tablet by mouth daily.    Historical Provider, MD  spironolactone (ALDACTONE) 25 MG tablet Take 0.5 tablets (12.5 mg total) by mouth daily. 06/06/16   Jerline Pain, MD    Family History Family History  Problem Relation Age of Onset  . Diabetes Mother   . Multiple sclerosis Mother   . Cancer Father     ?  [name] CAD      Father's side CAD    Social History Social History  Substance Use Topics  . Smoking status: Never Smoker  . Smokeless tobacco: Not on file  . Alcohol use Yes     Comment: rare     Allergies   Patient has no known allergies.   Review of Systems Review of Systems  All other systems reviewed and are negative.    Physical Exam Updated Vital Signs BP 95/71 (BP Location: Left Arm)   Pulse (!) 153   Temp 97.4 F (36.3 C)   Resp 16   Wt 132 lb (59.9 kg)   LMP  (LMP Unknown)   SpO2 100%   BMI 23.20 kg/m   Physical Exam  Constitutional: She is oriented to person, place, and time. She appears well-developed and well-nourished. No distress.  HENT:  Head: Normocephalic and atraumatic.  Eyes: EOM are normal.  Neck: Normal range of motion.  Cardiovascular:  Tachycardic.  No murmur  Pulmonary/Chest: Effort normal and breath sounds normal.  Abdominal: Soft. She exhibits no distension. There is no tenderness.  Musculoskeletal: Normal range of motion. She exhibits no edema.  Neurological: She is alert and oriented to person, place, and time.  Skin: Skin is warm and dry.  Psychiatric: She has a normal mood and affect. Judgment normal.  Nursing note and vitals reviewed.    ED Treatments / Results  Labs (all labs ordered  are listed, but only abnormal results are displayed) Labs Reviewed  CBC WITH DIFFERENTIAL/PLATELET  COMPREHENSIVE METABOLIC PANEL  TROPONIN I  BRAIN NATRIURETIC PEPTIDE  MAGNESIUM  PHOSPHORUS  TSH    EKG  ECG interpretation #1  Date: 07/27/2016  Rate: 154  Rhythm: Ventricular tachycardia  QRS Axis: Left axis  Intervals: Wide QRS  ST/T Wave abnormalities: normal  Conduction Disutrbances: VT  Narrative Interpretation:   Old EKG Reviewed: Change from prior EKG     ECG interpretation #2  Date: 07/27/2016  Rate: 60  Rhythm: Paced rhythm  Old EKG Reviewed: No longer ventricular tachycardia.        Radiology No results found.  Procedures Procedures (including critical care time)   ++++++++++++++++++++++++++++++++++++++++++++++++++++++++++++  CRITICAL CARE Performed by: Hoy Morn Total critical care time: 31 minutes Critical care time was exclusive of separately billable procedures and treating other patients. Critical care was necessary to treat or prevent imminent or life-threatening deterioration. Critical care was time spent personally by me on the following activities: development of treatment plan with patient and/or surrogate as well as nursing, discussions with consultants, evaluation of patient's response to treatment, examination of patient, obtaining history from patient or surrogate, ordering and performing treatments and interventions, ordering and review of laboratory studies, ordering and review of radiographic studies, pulse oximetry and re-evaluation of patient's condition.  ++++++++++++++++++++++++++++++++++++++++++++++++++++++++++++++++   Medications Ordered in ED Medications  amiodarone (NEXTERONE) 1.8 mg/mL load via infusion 150 mg (not administered)  amiodarone (NEXTERONE PREMIX) 360-4.14 MG/200ML-% (1.8 mg/mL) IV infusion (not administered)    Followed by  amiodarone (NEXTERONE PREMIX) 360-4.14 MG/200ML-% (1.8 mg/mL) IV infusion (not  administered)     Initial Impression / Assessment and Plan / ED Course  I have reviewed the triage vital signs and the nursing notes.  Pertinent labs & imaging results that were available during my care of the patient were reviewed by me and considered in my medical decision making (see chart for details).  Clinical Course     Patient presenting with symptomatic ventricular tachycardia without syncope.  Blood pressure 87 systolic.  I was met at the bedside with the electrophysiology team.  I initiated amiodarone bolus and drip and the patient was overdrive paced out of her ventricular tachycardia with the assistance of the Medtronic rep.  She is monitored closely for any other arrhythmias.  She will continue on amiodarone at this time.  She'll be admitted to cardiology.  We'll continue to follow closely while in the emergency department.  Plan in the future will be to switch her pacemaker out to a pacemaker defibrillator.  This will likely occur tomorrow.  Labs pending  Final Clinical Impressions(s) / ED Diagnoses   Final diagnoses:  Ventricular tachycardia [facility])    New Prescriptions New Prescriptions   No medications on file     Jola Schmidt, MD 07/27/16 1004

## 2016-07-27 NOTE — ED Notes (Signed)
EKG from triage given to Dr. Venora Maples

## 2016-07-27 NOTE — ED Triage Notes (Signed)
Pt reports having palpitations and SOB today. Transmitted through her medtronic device this morning and was told to come here for Vtach. Pt denies any CP.

## 2016-07-27 NOTE — Telephone Encounter (Signed)
Received call transferred from operator and spoke with pt. She reports heart fluttering since 4 AM. Does not know heart rate or BP.  Also had similar episode on Sunday that lasted a couple of hours.  Husband was not able to get BP machine to record reading at that time. She feels tired and shaky. No energy.  Is currently in bed but does not feel as if she may pass out.  Taking coumadin as ordered. I asked pt to send transmission and we would review and call her back. I told pt if symptoms worsened she should call EMS.

## 2016-07-27 NOTE — Progress Notes (Addendum)
ANTICOAGULATION CONSULT NOTE - Initial Consult  Pharmacy Consult for coumadin Indication: atrial fibrillation  No Known Allergies  Patient Measurements: Weight: 132 lb (59.9 kg)   Vital Signs: Temp: 98.2 F (36.8 C) (12/13 1348) Temp Source: Oral (12/13 1348) BP: 103/76 (12/13 1300) Pulse Rate: 59 (12/13 1300)  Labs:  Recent Labs  07/27/16 1018  HGB 11.5*  HCT 34.7*  PLT 165  CREATININE 1.88*  TROPONINI 0.03*    Estimated Creatinine Clearance: 25.3 mL/min (by C-G formula based on SCr of 1.88 mg/dL (H)).   Medical History: Past Medical History:  Diagnosis Date  . Adenomatous colon polyp   . Atrial flutter ([place])    A fib/flutter DCCV x 2   (05/2008 & 12/2009)  . Cardiac pacemaker in situ 06/2008   CRT  . Cervical radiculopathy at C5   . Chronic systolic heart failure ([place])   . CKD (chronic kidney disease), stage III   . DDD (degenerative disc disease), cervical   . DDD (degenerative disc disease), lumbar   . Fibroid   . GERD (gastroesophageal reflux disease)   . Hearing loss 02/2010   L hearing loss/vertigo, steroids  . History of seizure disorder    Related Hx  . Left atrial enlargement    severe  . Mitral regurgitation    moderate  . Multiple sclerosis ([place]) 1984  . Nonischemic cardiomyopathy (HCC)    Moderate LVEF 35-40% by ECHO 2011, 25-30% by echo 2013  . Permanent atrial fibrillation ([place])   . Pulmonary hypertension, secondary 06/04/2013   As a result of nonischemic cardiomyopathy EF 25-30%  . Stroke [facility])    Right brain CVA, complete recovery 07/2003  . Vertigo 02/2010   L hearing loss/vertigo, steroids    Medications:  Prescriptions Prior to Admission  Medication Sig Dispense Refill Last Dose  . amiodarone (PACERONE) 200 MG tablet Take 1 tablet (200 mg total) by mouth 2 (two) times daily. 180 tablet 3   . BIOTIN PO Take 1 capsule by mouth daily.   Taking  . Calcium Carbonate-Vitamin D (CALCIUM + D PO) Take 2 tablets by mouth daily.    Taking   . carvedilol (COREG) 12.5 MG tablet Take 0.5 tablets (6.25 mg total) by mouth 2 (two) times daily. 180 tablet 3   . COUMADIN 5 MG tablet TAKE AS DIRECTED BY THE COUMADIN CLINIC 30 tablet 3 Taking  . fexofenadine (ALLEGRA) 180 MG tablet Take 180 mg by mouth daily as needed for allergies.    Taking  . furosemide (LASIX) 40 MG tablet Take 1 tablet (40 mg total) by mouth daily. May take extra 1/2 tablet as needed for swelling 105 tablet 3 Taking  . lisinopril (PRINIVIL,ZESTRIL) 2.5 MG tablet Take 1 tablet (2.5 mg total) by mouth daily. 90 tablet 3 Taking  . loratadine (CLARITIN) 10 MG tablet Take 10 mg by mouth daily as needed for allergies.   Taking  . MAGNESIUM PO Take 1 tablet by mouth daily.   Taking  . spironolactone (ALDACTONE) 25 MG tablet Take 0.5 tablets (12.5 mg total) by mouth daily. 45 tablet 3 Taking   Scheduled:  . amiodarone  150 mg Intravenous Once  . carvedilol  6.25 mg Oral BID  . furosemide  40 mg Oral Daily  . [START ON 07/28/2016] lisinopril  2.5 mg Oral Daily  . sodium chloride flush  3 mL Intravenous Q12H  . [START ON 07/28/2016] spironolactone  12.5 mg Oral Daily    Assessment: 65 yo female here with [place]  on coumadin PTA for afib. She is noted for likely ICD upgrade. Pharmacy has been consulted to dose coumadin. She is on amiodarone PTA and currenly undergoing an amiodarone load.   Patient report home dose as 2.5mg /day except 5mg  on MWF  Goal of Therapy:  INR 2-3 Monitor platelets by anticoagulation protocol: Yes   Plan:  -PT/INR now -Will follow results for coumadin dosing  Hildred Laser, Pharm D 07/27/2016 2:26 PM  Addendum -INR= 2.65  Plan -Continue coumadin at home dose -Daily PT/INR for now  Hildred Laser, Pharm D 07/27/2016 3:36 PM

## 2016-07-27 NOTE — Telephone Encounter (Signed)
New message  Heart fluttering since 4 am today, and some yesterday pm, then went away  Fatigue/weakness

## 2016-07-27 NOTE — Telephone Encounter (Signed)
Reviewed CRT-P transmission.  Presenting rhythm appears A-fib and VT, rate 150s, historically ventricularly dependent.  Episode possibly ongoing since evening of 12/12, but difficult to tell due to falling in and out of [facility] detection.  Spoke with patient and husband.  Patient reports she has been taking her amiodarone as prescribed.  Advised that due to symptoms and ongoing VT, recommendation is to call 911 for transport to [facility].  Patient asks if her husband can drive her, but advised both patient and husband that our recommendation is to call 911 due to the urgency of the situation.  Patient and husband verbalize appreciation and deny additional questions at this time.  Chanetta Marshall, NP, made aware that patient is proceeding to the ED via EMS.

## 2016-07-27 NOTE — H&P (Signed)
ELECTROPHYSIOLOGY HISTORY AND PHYSICAL    Patient ID: Kristi Parker MRN: [id number], DOB/AGE: May 08, 1951 65 y.o.  Admit date: 07/27/2016 Date of Consult: 07/27/2016  Primary Physician: Irven Shelling, MD Primary Cardiologist: Marlou Porch Electrophysiologist: Allred  CC: palpitations and fatigue  HPI:  Kristi Parker is a 65 y.o. female with a past medical history significant for complete heart block, non ischemic cardiomyopathy, persistent atrial fibrillation, prior CVA, CKD who was seen last week in the office by Dr Marlou Porch in the office last week. Device interrogation was done at that time which showed episodes of sustained ventricular tachycardia. Repeat echo was ordered and she was restarted on amiodarone with EP follow up scheduled.  She has had persistent palpitations and fatigue since that time and was awakened this morning with palpitations. She called the device clinic who asked her to send a [facility] transmission that showed ventricular tachycardia. She was advised to go to the ER by EMS but her husband drove her.  On arrival, she was found to be in VT at a rate of 150bpm.  IV Amiodarone was started and her VT was pace terminated at a cycle length of 351msec.    She currently denies chest pain, shortness of breath, LE edema, recent fevers, chills, nausea or vomiting.  She has not had dizziness, pre-syncope, or syncope.     Past Medical History:  Diagnosis Date  . Adenomatous colon polyp   . Atrial flutter ([place])    A fib/flutter DCCV x 2   (05/2008 & 12/2009)  . Cardiac pacemaker in situ 06/2008   CRT  . Cervical radiculopathy at C5   . Chronic systolic heart failure ([place])   . CKD (chronic kidney disease), stage III   . DDD (degenerative disc disease), cervical   . DDD (degenerative disc disease), lumbar   . Fibroid   . GERD (gastroesophageal reflux disease)   . Hearing loss 02/2010   L hearing loss/vertigo, steroids  . History of seizure disorder    Related Hx    . Left atrial enlargement    severe  . Mitral regurgitation    moderate  . Multiple sclerosis ([place]) 1984  . Nonischemic cardiomyopathy (HCC)    Moderate LVEF 35-40% by ECHO 2011, 25-30% by echo 2013  . Permanent atrial fibrillation ([place])   . Pulmonary hypertension, secondary 06/04/2013   As a result of nonischemic cardiomyopathy EF 25-30%  . Stroke [facility])    Right brain CVA, complete recovery 07/2003  . Vertigo 02/2010   L hearing loss/vertigo, steroids     Surgical History:  Past Surgical History:  Procedure Laterality Date  . BIV PACEMAKER GENERATOR CHANGE OUT N/A 09/18/2014   Procedure: BIV PACEMAKER GENERATOR CHANGE OUT;  Surgeon: Thompson Grayer, MD;  Location: [facility] CATH LAB;  Service: Cardiovascular;  Laterality: N/A;  . DILATION AND CURETTAGE OF UTERUS    . HYSTEROSCOPY    . PACEMAKER INSERTION     PTVP 08/2001 for complete heart block. Upgrade PTVP to MDT BiV 06/2008 by Dr Leonia Reeves  . TUBAL LIGATION        (Not in a hospital admission)  Inpatient Medications:  . amiodarone  150 mg Intravenous Once    Allergies: No Known Allergies  Social History   Social History  . Marital status: Married    Spouse name: N/A  . Number of children: N/A  . Years of education: N/A   Occupational History  . Not on file.   Social History Main Topics  . Smoking  status: Never Smoker  . Smokeless tobacco: Not on file  . Alcohol use Yes     Comment: rare  . Drug use: Unknown  . Sexual activity: Yes    Birth control/ protection: Post-menopausal   Other Topics Concern  . Not on file   Social History Narrative  . No narrative on file     Family History  Problem Relation Age of Onset  . Diabetes Mother   . Multiple sclerosis Mother   . Cancer Father     ?  [name] CAD      Father's side CAD     Review of Systems: All other systems reviewed and are otherwise negative except as noted above.  Physical Exam: Vitals:   07/27/16 0945 07/27/16 0946 07/27/16 0948 07/27/16 1001  BP:  95/71 95/71 93/77  (!) 87/60  Pulse: (!) 150 (!) 153 (!) 165 62  Resp: 16 16 12 19   Temp:  97.4 F (36.3 C)    TempSrc:      SpO2: 100% 100% 100% 100%  Weight:        GEN- The patient is well appearing, alert and oriented x 3 today.   HEENT: normocephalic, atraumatic; sclera clear, conjunctiva pink; hearing intact; oropharynx clear; neck supple  Lungs- Clear to ausculation bilaterally, normal work of breathing.  No wheezes, rales, rhonchi Heart- Tachcyardic regular rate and rhythm  GI- soft, non-tender, non-distended, bowel sounds present  Extremities- no clubbing, cyanosis, or edema  MS- no significant deformity or atrophy Skin- warm and dry, no rash or lesion Psych- euthymic mood, full affect Neuro- strength and sensation are intact  Labs:   Lab Results  Component Value Date   WBC 4.1 09/09/2014   HGB 11.9 (L) 09/09/2014   HCT 34.7 (L) 09/09/2014   MCV 90.4 09/09/2014   PLT 117.0 (L) 09/09/2014   No results for input(s): NA, K, CL, CO2, BUN, CREATININE, CALCIUM, PROT, BILITOT, ALKPHOS, ALT, AST, GLUCOSE in the last 168 hours.  Invalid input(s): LABALBU    Radiology/Studies: No results found.  EKG: ventricular tachycardia, rate 154  DEVICE HISTORY: MDT single chamber PPM implanted 2003; upgrade to [place] 2009; gen change 2016  Assessment/Plan: 1.  Ventricular tachycardia Symptomatic and recurrent despite po amiodarone Able to be pace terminated at a cycle length of 351msec Will load with IV amiodarone now Keep K >3.9, Mg >1.8 Update echo Will likely need upgrade to ICD  She has not had any ischemic symptoms at all and continues to exercise at [place] 5 days/week. Discussed with Dr Marlou Porch - if echo largely unchanged, he does not feel strongly about catheterization prior to upgrad  2.  Chronic systolic heart failure Euvolemic on exam Continue current therapy  3.  Persistent atrial fibrillation Previously thought to be permanent, but device interrogation demonstrates  periods of SR.  Will leave device programmed DDDR for now Continue Warfarin for CHADS2VASC of 5  4.  Mitral regurgitation Repeat echo   Plan - admit to stepdown for IV amiodarone loading and CRTD upgrade   Signed, Chanetta Marshall, NP 07/27/2016 10:11 AM   Pt seen and examined Hx NICM ( EF 5/16 4045%) complete Haert block with pacing, upgrade and gen change- most recently 2016, with known recurrences of atrial fibrillation and more recently found to have prolonged episodes of ventricular tachycardia, confirmed by ICD.    Started  on Amio for VT earlier this week, and comes in today with more tachypalps  And found to have VT Today successfully pace terminated.  Amio >>IV  For CRTP>>CRT-D upgrade in the am.  This will be 4 th procedure.    Have reviewed the potential benefits and risks of ICD implantation including but not limited to death, perforation of heart or lung, lead dislodgement, infection,  device malfunction and inappropriate shocks.  The patient and family express understanding  and are willing to proceed.    We also need reassessment of LV function as we try to identify the cause for the flurry of VT over recent days, which previously never had    Explained amio may also help increasing Afib burden

## 2016-07-27 NOTE — ED Notes (Signed)
Admitting Cardiology paged re: BP

## 2016-07-27 NOTE — ED Notes (Signed)
Cardiology [place], MD at bedside, pt rcvd pacemaker manipulation d/t VT, pt tolerated well, manipulation successful

## 2016-07-28 ENCOUNTER — Encounter (HOSPITAL_COMMUNITY)
Admission: EM | Disposition: A | Discharge status: Home / Self Care | Payer: Self-pay | Source: Home / Self Care | Attending: Internal Medicine

## 2016-07-28 DIAGNOSIS — I472 Ventricular tachycardia: Principal | ICD-10-CM

## 2016-07-28 HISTORY — PX: EP IMPLANTABLE DEVICE: SHX172B

## 2016-07-28 LAB — CBC
HCT: 31.1 % — ABNORMAL LOW (ref 36.0–46.0)
Hemoglobin: 10.3 g/dL — ABNORMAL LOW (ref 12.0–15.0)
MCH: 30.1 pg (ref 26.0–34.0)
MCHC: 33.1 g/dL (ref 30.0–36.0)
MCV: 90.9 fL (ref 78.0–100.0)
Platelets: 134 10*3/uL — ABNORMAL LOW (ref 150–400)
RBC: 3.42 MIL/uL — ABNORMAL LOW (ref 3.87–5.11)
RDW: 14.5 % (ref 11.5–15.5)
WBC: 4.5 10*3/uL (ref 4.0–10.5)

## 2016-07-28 LAB — BASIC METABOLIC PANEL
Anion gap: 9 (ref 5–15)
BUN: 11 mg/dL (ref 6–20)
CO2: 26 mmol/L (ref 22–32)
Calcium: 9.1 mg/dL (ref 8.9–10.3)
Chloride: 105 mmol/L (ref 101–111)
Creatinine, Ser: 1.64 mg/dL — ABNORMAL HIGH (ref 0.44–1.00)
GFR calc Af Amer: 37 mL/min — ABNORMAL LOW (ref >60)
GFR calc non Af Amer: 32 mL/min — ABNORMAL LOW (ref >60)
Glucose, Bld: 99 mg/dL (ref 65–99)
Potassium: 3.5 mmol/L (ref 3.5–5.1)
Sodium: 140 mmol/L (ref 135–145)

## 2016-07-28 LAB — PROTIME-INR
INR: 2.97
Prothrombin Time: 31.5 seconds — ABNORMAL HIGH (ref 11.4–15.2)

## 2016-07-28 LAB — SURGICAL PCR SCREEN
MRSA, PCR: NEGATIVE
Staphylococcus aureus: NEGATIVE

## 2016-07-28 SURGERY — ICD IMPLANT
Anesthesia: LOCAL

## 2016-07-28 MED ORDER — ACETAMINOPHEN 325 MG PO TABS
325.0000 mg | ORAL_TABLET | ORAL | Status: DC | PRN
Start: 2016-07-28 — End: 2016-07-29
  Administered 2016-07-28: 650 mg via ORAL
  Filled 2016-07-28: qty 2

## 2016-07-28 MED ORDER — GENTAMICIN SULFATE 40 MG/ML IJ SOLN
INTRAMUSCULAR | Status: AC
  Filled 2016-07-28: qty 2

## 2016-07-28 MED ORDER — ONDANSETRON HCL 4 MG/2ML IJ SOLN: 4.0000 mg | Freq: Four times a day (QID) | INTRAMUSCULAR | Status: DC | PRN

## 2016-07-28 MED ORDER — SODIUM CHLORIDE 0.9 % IV SOLN
INTRAVENOUS | Status: DC
  Administered 2016-07-28: 50 mL/h via INTRAVENOUS

## 2016-07-28 MED ORDER — FENTANYL CITRATE (PF) 100 MCG/2ML IJ SOLN
INTRAMUSCULAR | Status: AC
  Filled 2016-07-28: qty 2

## 2016-07-28 MED ORDER — FENTANYL CITRATE (PF) 100 MCG/2ML IJ SOLN
INTRAMUSCULAR | Status: DC | PRN
  Administered 2016-07-28 (×6): 12.5 ug via INTRAVENOUS
  Administered 2016-07-28: 25 ug via INTRAVENOUS
  Administered 2016-07-28 (×2): 12.5 ug via INTRAVENOUS

## 2016-07-28 MED ORDER — TRAMADOL HCL 50 MG PO TABS
50.0000 mg | ORAL_TABLET | Freq: Four times a day (QID) | ORAL | Status: DC | PRN
  Administered 2016-07-28 – 2016-07-29 (×2): 50 mg via ORAL
  Filled 2016-07-28 (×2): qty 1

## 2016-07-28 MED ORDER — IOPAMIDOL (ISOVUE-370) INJECTION 76%
INTRAVENOUS | Status: DC | PRN
  Administered 2016-07-28 (×2): 10 mL via INTRAVENOUS

## 2016-07-28 MED ORDER — CHLORHEXIDINE GLUCONATE 4 % EX LIQD: 60.0000 mL | Freq: Once | CUTANEOUS | Status: DC

## 2016-07-28 MED ORDER — ACETAMINOPHEN 500 MG PO TABS: 500.0000 mg | ORAL_TABLET | Freq: Four times a day (QID) | ORAL | Status: DC | PRN

## 2016-07-28 MED ORDER — CEFAZOLIN SODIUM-DEXTROSE 2-4 GM/100ML-% IV SOLN
2.0000 g | INTRAVENOUS | Status: AC
Start: 2016-07-28 — End: 2016-07-28
  Administered 2016-07-28: 2 g via INTRAVENOUS

## 2016-07-28 MED ORDER — IOPAMIDOL (ISOVUE-370) INJECTION 76%
INTRAVENOUS | Status: AC
  Filled 2016-07-28: qty 50

## 2016-07-28 MED ORDER — LIDOCAINE HCL (PF) 1 % IJ SOLN
INTRAMUSCULAR | Status: DC | PRN
  Administered 2016-07-28: 50 mL

## 2016-07-28 MED ORDER — CEFAZOLIN IN D5W 1 GM/50ML IV SOLN
1.0000 g | Freq: Four times a day (QID) | INTRAVENOUS | Status: AC
  Administered 2016-07-28: 1 g via INTRAVENOUS
  Filled 2016-07-28: qty 50

## 2016-07-28 MED ORDER — MIDAZOLAM HCL 5 MG/5ML IJ SOLN
INTRAMUSCULAR | Status: AC
  Filled 2016-07-28: qty 5

## 2016-07-28 MED ORDER — WARFARIN SODIUM 2.5 MG PO TABS: 2.5000 mg | ORAL_TABLET | Freq: Once | ORAL | Status: DC

## 2016-07-28 MED ORDER — LIDOCAINE HCL (PF) 1 % IJ SOLN
INTRAMUSCULAR | Status: AC
  Filled 2016-07-28: qty 60

## 2016-07-28 MED ORDER — MIDAZOLAM HCL 5 MG/5ML IJ SOLN
INTRAMUSCULAR | Status: DC | PRN
  Administered 2016-07-28 (×8): 1 mg via INTRAVENOUS

## 2016-07-28 MED ORDER — SODIUM CHLORIDE 0.9 % IV SOLN: INTRAVENOUS | Status: DC

## 2016-07-28 MED ORDER — CHLORHEXIDINE GLUCONATE CLOTH 2 % EX PADS
6.0000 | MEDICATED_PAD | Freq: Every day | CUTANEOUS | Status: DC
  Administered 2016-07-28: 6 via TOPICAL

## 2016-07-28 MED ORDER — SODIUM CHLORIDE 0.9 % IR SOLN
80.0000 mg | Status: AC
  Administered 2016-07-28: 80 mg

## 2016-07-28 MED ORDER — HEPARIN (PORCINE) IN NACL 2-0.9 UNIT/ML-% IJ SOLN
INTRAMUSCULAR | Status: DC | PRN
  Administered 2016-07-28: 17:00:00

## 2016-07-28 MED ORDER — CEFAZOLIN SODIUM-DEXTROSE 2-4 GM/100ML-% IV SOLN
INTRAVENOUS | Status: AC
  Filled 2016-07-28: qty 100

## 2016-07-28 SURGICAL SUPPLY — 7 items
AMPLIA MRI DTMB1D4 (ICD Generator) ×1 IMPLANT
CABLE SURGICAL S-101-97-12 (CABLE) ×1 IMPLANT
GUIDEWIRE ANGLED .035X150CM (WIRE) ×1 IMPLANT
LEAD SPRINT QUAT SEC 6935M-55 (Lead) ×1 IMPLANT
PAD DEFIB LIFELINK (PAD) ×1 IMPLANT
SHEATH CLASSIC 9F 25CM (SHEATH) ×1 IMPLANT
TRAY PACEMAKER INSERTION (PACKS) ×1 IMPLANT

## 2016-07-28 NOTE — Discharge Summary (Signed)
ELECTROPHYSIOLOGY PROCEDURE DISCHARGE SUMMARY    Patient ID: Kristi Parker,  MRN: [id number], DOB/AGE: [birth year] 65 y.o.  Admit date: 07/27/2016 Discharge date: 07/29/2016  Primary Care Physician: Irven Shelling, MD  Primary Cardiologist: Dr. Marlou Porch Electrophysiologist: Dr. Rayann Heman  Primary Discharge Diagnosis:  1. VT     Upgrade of her PPM to ICD this admission  Secondary Discharge Diagnosis:  1. CHB w/PPM 2. Persistent AFib     CHA2DS2Vasc is at least 2, on warfarin, monitored and managed by coumadin clinic, next appointment 08/10/16 3. NICM 4. Chronic CHF (systolic) 5. MR  No Known Allergies   Procedures This Admission:  1. Upgrade of a dual chamber PPM to ICD on 07/28/16 by Dr Lovena Le.  The patient received a Medtronic [id number] active fixation defibrillation lead, serial number [id number] and Amplia Mri [id number], serial number [id number] H ICD.  DFT's were successful at [place].  There were no immediate post procedure complications. 2.  CXR on 07/29/16  demonstrated no pneumothorax status post device implantation.   Brief HPI: Kristi Parker is a 65 y.o. female was admitted to [facility] 07/27/16 with c/o palpitations noted to be is sustained VT.  She was pace terminated via her PPM in the ED, started on IV amiodarone and admitted for further care and management.    Hospital Course:  The patient was admitted to ICU, an echo was done noting LVEF 30-35%, mod MR, diffuse hypokinesis, 07/28/16 underwent upgrade of her PPM to ICD with details as outlined above.  She was monitored on telemetry overnight which demonstrated AV pacing.  Left chest was without hematoma or ecchymosis.  The device was interrogated and found to be functioning normally.  CXR was obtained and demonstrated no pneumothorax status post device implantation.  Wound care, arm mobility, and restrictions were reviewed with the patient.  The patient was examined by Dr. Lovena Le and considered stable for discharge to  home. We will continue her on PO amiodarone 400mg  daily for 2 weeks, then down-titrate to 200mg  daily.  She will keep her appointment for early follow up with Dr. Rayann Heman. BP  105/62 this AM. Pt denies any significant pain, mild ache, does not request pain medication for home.     Physical Exam: Vitals:   07/28/16 2119 07/28/16 2217 07/29/16 0503 07/29/16 0941  BP: 109/62 (!) 101/57 (!) 85/63 105/62  Pulse: 60 (!) 58 (!) 59 60  Resp: 16 16 16    Temp: 97.8 F (36.6 C) 98.2 F (36.8 C) 97.6 F (36.4 C)   TempSrc: Axillary Axillary Oral   SpO2: 100% 100% 98%   Weight:      Height:          GEN- The patient is well appearing, alert and oriented x 3 today.   HEENT: normocephalic, atraumatic; sclera clear, conjunctiva pink; hearing intact; oropharynx clear Lungs- Clear to ausculation bilaterally, normal work of breathing.  No wheezes, rales, rhonchi Heart- Regular rate and rhythm, no murmurs, rubs or gallops, PMI not laterally displaced GI- soft, non-tender, non-distended Extremities- no clubbing, cyanosis, or edema MS- no significant deformity or atrophy Skin- warm and dry, no rash or lesion, left chest without hematoma/ecchymosis Psych- euthymic mood, full affect Neuro- no gross defecits  Labs:   Lab Results  Component Value Date   WBC 4.5 07/28/2016   HGB 10.3 (L) 07/28/2016   HCT 31.1 (L) 07/28/2016   MCV 90.9 07/28/2016   PLT 134 (L) 07/28/2016     Recent Labs Lab 07/27/16 1018 07/28/16 0318  NA 140 140  K 3.9 3.5  CL 105 105  CO2 24 26  BUN 18 11  CREATININE 1.88* 1.64*  CALCIUM 9.8 9.1  PROT 6.9  --   BILITOT 2.3*  --   ALKPHOS 106  --   ALT 25  --   AST 19  --   GLUCOSE 130* 99    Discharge Medications:  Allergies as of 07/29/2016   No Known Allergies     Medication List    TAKE these medications   acetaminophen 500 MG tablet Commonly known as:  TYLENOL Take 500-1,000 mg by mouth every 6 (six) hours as needed (for back pain).   ALLEGRA 180  MG tablet Generic drug:  fexofenadine Take 180 mg by mouth daily as needed for allergies.   amiodarone 200 MG tablet Commonly known as:  PACERONE Take 1 tablet (200 mg total) by mouth daily. Start by taking 2 tablets (400mg ) by moth daily for 2 weeks, then reduce to 1 tablet (200mg ) daily. Start taking on:  07/30/2016 What changed:  when to take this  additional instructions   BIOTIN PO Take 1 capsule by mouth daily.   CALCIUM + D PO Take 1 tablet by mouth daily.   carvedilol 12.5 MG tablet Commonly known as:  COREG Take 0.5 tablets (6.25 mg total) by mouth 2 (two) times daily.   COUMADIN 5 MG tablet Generic drug:  warfarin TAKE AS DIRECTED BY THE COUMADIN CLINIC What changed:  See the new instructions. Notes to patient:  Continue your current home regime unchanged until your next INR check and further recommendations then   furosemide 40 MG tablet Commonly known as:  LASIX Take 1 tablet (40 mg total) by mouth daily. May take extra 1/2 tablet as needed for swelling What changed:  additional instructions   lisinopril 2.5 MG tablet Commonly known as:  PRINIVIL,ZESTRIL Take 1 tablet (2.5 mg total) by mouth daily.   loratadine 10 MG tablet Commonly known as:  CLARITIN Take 10 mg by mouth daily as needed for allergies.   MAGNESIUM PO Take 1 tablet by mouth daily.   spironolactone 25 MG tablet Commonly known as:  ALDACTONE Take 0.5 tablets (12.5 mg total) by mouth daily.       Disposition:  Home Discharge Instructions    Diet - low sodium heart healthy    Complete by:  As directed    Increase activity slowly    Complete by:  As directed      Follow-up Information    Thompson Grayer, MD Follow up on 08/10/2016.   Specialty:  Cardiology Why:  8:30AM Dr. Rayann Heman 9:15AM coumadin clinic  Contact information: [address] [place] [address] [phone]           Duration of Discharge Encounter: Greater than 30 minutes including physician  time.  Venetia Night, PA-C 07/29/2016 9:57 AM  EP Attending  Patient seen and examined. Agree with above. She is doing well after upgrade to a BiV ICD from a BiV PPM. Will plan to DC home with followup as outlined above. CXR is ok.  Mikle Bosworth.D.

## 2016-07-28 NOTE — H&P (Signed)
ICD Criteria  Current LVEF:30%. Within 12 months prior to implant: Yes   Heart failure history: Yes, Class II  Cardiomyopathy history: Yes, Non-Ischemic Cardiomyopathy.  Atrial Fibrillation/Atrial Flutter: Yes, Long standing (> 1 Year).  Ventricular tachycardia history: Yes, Hemodynamic instability present. VT Type: Sustained Ventricular Tachycardia - Monomorphic.  Cardiac arrest history: No.  History of syndromes with risk of sudden death: No.  Previous ICD: No.  Current ICD indication: Secondary  PPM indication: Yes. Pacing type: Ventricular. Greater than 40% RV pacing requirement anticipated. Indication: Complete Heart Block   Class I or II Bradycardia indication present: Yes  Beta Blocker therapy for 3 or more months: Yes, prescribed.   Ace Inhibitor/ARB therapy for 3 or more months: Yes, prescribed.

## 2016-07-28 NOTE — Progress Notes (Signed)
ANTICOAGULATION CONSULT NOTE   Pharmacy Consult for coumadin Indication: atrial fibrillation  No Known Allergies  Patient Measurements: Height: 5' 3.25" (160.7 cm) (5 feet 3.25 inches) Weight: 137 lb 2 oz (62.2 kg) IBW/kg (Calculated) : 52.98   Vital Signs: Temp: 97.9 F (36.6 C) (12/14 0800) Temp Source: Oral (12/14 0800) BP: 98/59 (12/14 0800) Pulse Rate: 62 (12/14 0800)  Labs:  Recent Labs  07/27/16 1018 07/27/16 1502 07/28/16 0318  HGB 11.5*  --  10.3*  HCT 34.7*  --  31.1*  PLT 165  --  134*  LABPROT  --  28.8* 31.5*  INR  --  2.65 2.97  CREATININE 1.88*  --  1.64*  TROPONINI 0.03*  --   --     Estimated Creatinine Clearance: 29 mL/min (by C-G formula based on SCr of 1.64 mg/dL (H)).   Scheduled:  . carvedilol  6.25 mg Oral BID  .  ceFAZolin (ANCEF) IV  2 g Intravenous On Call  . chlorhexidine  60 mL Topical Once  . chlorhexidine  60 mL Topical Once  . Chlorhexidine Gluconate Cloth  6 each Topical Q0600  . furosemide  40 mg Oral Daily  . gentamicin irrigation  80 mg Irrigation On Call  . lisinopril  2.5 mg Oral Daily  . sodium chloride flush  3 mL Intravenous Q12H  . spironolactone  12.5 mg Oral Daily  . warfarin  2.5 mg Oral Once per day on Sun Tue Thu Sat  . warfarin  2.5 mg Oral Once per day on Mon Wed Fri  . Warfarin - Pharmacist Dosing Inpatient   Does not apply q1800    Assessment: 65 yo female here with Vtach on coumadin PTA for afib. She is noted for likely ICD upgrade. Pharmacy has been consulted to dose coumadin. She is on amiodarone PTA and currenly undergoing an amiodarone load.  -INR= 2.97  Patient report home dose as 2.5mg /day except 5mg  on MWF  Goal of Therapy:  INR 2-3 Monitor platelets by anticoagulation protocol: Yes   Plan:  -Coumadin 2.5mg  po today (may see increased coumadin effect with amiodarone load) -Daily PT/INR  Hildred Laser, Pharm D 07/28/2016 10:54 AM

## 2016-07-28 NOTE — Progress Notes (Signed)
Pt heart rhythm 120s-130s sustained Vt. Pt reported feeling palpitations. Eula Fried MD notified. EKG obtained. 150mg  bolus amio given. amio increased to 60mg . Pt HR now 60s-70s. BP stable. Pt resting comfortably. Will continue to monitor

## 2016-07-28 NOTE — Progress Notes (Addendum)
Patient Name: Kristi Parker Date of Encounter: 07/28/2016     Active Problems:   Ventricular tachycardia (HCC)    SUBJECTIVE  Anxious but no chest pain, sob, or palpitations.  CURRENT MEDS . carvedilol  6.25 mg Oral BID  .  ceFAZolin (ANCEF) IV  2 g Intravenous On Call  . chlorhexidine  60 mL Topical Once  . chlorhexidine  60 mL Topical Once  . Chlorhexidine Gluconate Cloth  6 each Topical Q0600  . furosemide  40 mg Oral Daily  . gentamicin irrigation  80 mg Irrigation On Call  . lisinopril  2.5 mg Oral Daily  . sodium chloride flush  3 mL Intravenous Q12H  . spironolactone  12.5 mg Oral Daily  . warfarin  2.5 mg Oral Once per day on Sun Tue Thu Sat  . warfarin  2.5 mg Oral Once per day on Mon Wed Fri  . Warfarin - Pharmacist Dosing Inpatient   Does not apply q1800    OBJECTIVE  Vitals:   07/27/16 2348 07/28/16 0000 07/28/16 0300 07/28/16 0800  BP: 100/69 (!) 96/54 97/62 (!) 98/59  Pulse: (!) 129 76 61 62  Resp: 13 16 15 16   Temp: 98.2 F (36.8 C)  98 F (36.7 C) 97.9 F (36.6 C)  TempSrc: Oral  Oral Oral  SpO2: 99% 100% 98% 97%  Weight:      Height:        Intake/Output Summary (Last 24 hours) at 07/28/16 1016 Last data filed at 07/28/16 6269  Gross per 24 hour  Intake          1224.95 ml  Output             1350 ml  Net          -125.05 ml   Filed Weights   07/27/16 0932 07/27/16 1400  Weight: 132 lb (59.9 kg) 137 lb 2 oz (62.2 kg)    PHYSICAL EXAM  General: Pleasant, 65 yo woman, NAD. Neuro: Alert and oriented X 3. Moves all extremities spontaneously. Psych: Normal affect. HEENT:  Normal  Neck: Supple without bruits or JVD. Lungs:  Resp regular and unlabored, CTA. Heart: RRR no s3, s4, or murmurs. Abdomen: Soft, non-tender, non-distended, BS + x 4.  Extremities: No clubbing, cyanosis or edema. DP/PT/Radials 2+ and equal bilaterally.  Accessory Clinical Findings  CBC  Recent Labs  07/27/16 1018 07/28/16 0318  WBC 5.2 4.5    NEUTROABS 3.5  --   HGB 11.5* 10.3*  HCT 34.7* 31.1*  MCV 91.1 90.9  PLT 165 485*   Basic Metabolic Panel  Recent Labs  07/27/16 1018 07/28/16 0318  NA 140 140  K 3.9 3.5  CL 105 105  CO2 24 26  GLUCOSE 130* 99  BUN 18 11  CREATININE 1.88* 1.64*  CALCIUM 9.8 9.1  MG 2.1  --   PHOS 3.7  --    Liver Function Tests  Recent Labs  07/27/16 1018  AST 19  ALT 25  ALKPHOS 106  BILITOT 2.3*  PROT 6.9  ALBUMIN 4.1   No results for input(s): LIPASE, AMYLASE in the last 72 hours. Cardiac Enzymes  Recent Labs  07/27/16 1018  TROPONINI 0.03*   BNP Invalid input(s): POCBNP D-Dimer No results for input(s): DDIMER in the last 72 hours. Hemoglobin A1C No results for input(s): HGBA1C in the last 72 hours. Fasting Lipid Panel No results for input(s): CHOL, HDL, LDLCALC, TRIG, CHOLHDL, LDLDIRECT in the last 72 hours. Thyroid  Function Tests  Recent Labs  07/27/16 1031  TSH 4.991*    TELE  Atrial fib with biV pacing  Radiology/Studies  No results found.  ASSESSMENT AND PLAN  1. VT - she has maintained NSR. Will plan to proceed with upgrade to a BiV ICD from a BiV PPM today. Will plan to transition to oral amiodarone 2. Chronic systolic heart failure - her symptoms are well compensated at present. Will follow. 3. BiV PPM - will attempt to upgrade to a BiV ICD today. I have discussed the indications, risks/benefits/goals/expectations of the procedure with the patient and she wishes to proceed.  Carleene Overlie Ashaz Robling,M.D.  07/28/2016 10:16 AMPatient ID: Silvio Clayman, female   DOB: 01-Jan-1951, 65 y.o.   MRN: [id number]

## 2016-07-29 ENCOUNTER — Encounter (HOSPITAL_COMMUNITY): Payer: Self-pay | Admitting: Internal Medicine

## 2016-07-29 ENCOUNTER — Inpatient Hospital Stay (HOSPITAL_COMMUNITY): Payer: Medicare Other

## 2016-07-29 LAB — PROTIME-INR
INR: 2.74
Prothrombin Time: 29.6 seconds — ABNORMAL HIGH (ref 11.4–15.2)

## 2016-07-29 MED ORDER — CEFAZOLIN IN D5W 1 GM/50ML IV SOLN
1.0000 g | Freq: Once | INTRAVENOUS | Status: AC
  Administered 2016-07-29: 1 g via INTRAVENOUS
  Filled 2016-07-29: qty 50

## 2016-07-29 MED ORDER — AMIODARONE HCL 200 MG PO TABS: 200.0000 mg | ORAL_TABLET | Freq: Every day | ORAL | 3 refills | Status: DC

## 2016-07-29 MED ORDER — WARFARIN SODIUM 2.5 MG PO TABS: 2.5000 mg | ORAL_TABLET | Freq: Once | ORAL | Status: DC

## 2016-07-29 MED ORDER — AMIODARONE HCL 200 MG PO TABS
400.0000 mg | ORAL_TABLET | Freq: Every day | ORAL | Status: DC
  Administered 2016-07-29: 400 mg via ORAL
  Filled 2016-07-29: qty 2

## 2016-07-29 NOTE — Discharge Instructions (Signed)
° ° °  Supplemental Discharge Instructions for  Pacemaker/Defibrillator Patients  Activity No heavy lifting or vigorous activity with your left/right arm for 6 to 8 weeks.  Do not raise your left/right arm above your head for one week.  Gradually raise your affected arm as drawn below.             08/01/16                   08/02/16                 08/03/16                 08/04/16 __  NO DRIVING until cleared to.  WOUND CARE - Keep the wound area clean and dry.  Do not get this area wet for one week. No showers for one week; you may shower on 08/04/16  . - The tape/steri-strips on your wound will fall off; do not pull them off.  No bandage is needed on the site.  DO  NOT apply any creams, oils, or ointments to the wound area. - If you notice any drainage or discharge from the wound, any swelling or bruising at the site, or you develop a fever > 101? F after you are discharged home, call the office at once.  Special Instructions - You are still able to use cellular telephones; use the ear opposite the side where you have your pacemaker/defibrillator.  Avoid carrying your cellular phone near your device. - When traveling through airports, show security personnel your identification card to avoid being screened in the metal detectors.  Ask the security personnel to use the hand wand. - Avoid arc welding equipment, MRI testing (magnetic resonance imaging), TENS units (transcutaneous nerve stimulators).  Call the office for questions about other devices. - Avoid electrical appliances that are in poor condition or are not properly grounded. - Microwave ovens are safe to be near or to operate.  Additional information for defibrillator patients should your device go off: - If your device goes off ONCE and you feel fine afterward, notify the device clinic nurses. - If your device goes off ONCE and you do not feel well afterward, call 911. - If your device goes off TWICE, call 911. - If your device  goes off THREE times in one day, call 911.  DO NOT DRIVE YOURSELF OR A FAMILY MEMBER WITH A DEFIBRILLATOR TO THE HOSPITAL--CALL 911.

## 2016-07-29 NOTE — Progress Notes (Signed)
ANTICOAGULATION CONSULT NOTE - Follow Up Consult  Pharmacy Consult for Coumadin Indication: atrial fibrillation  No Known Allergies  Patient Measurements: Height: 5' 3.25" (160.7 cm) (5 feet 3.25 inches) Weight: 137 lb 2 oz (62.2 kg) IBW/kg (Calculated) : 52.98  Vital Signs: Temp: 97.6 F (36.4 C) (12/15 0503) Temp Source: Oral (12/15 0503) BP: 85/63 (12/15 0503) Pulse Rate: 59 (12/15 0503)  Labs:  Recent Labs  07/27/16 1018 07/27/16 1502 07/28/16 0318 07/29/16 0152  HGB 11.5*  --  10.3*  --   HCT 34.7*  --  31.1*  --   PLT 165  --  134*  --   LABPROT  --  28.8* 31.5* 29.6*  INR  --  2.65 2.97 2.74  CREATININE 1.88*  --  1.64*  --   TROPONINI 0.03*  --   --   --     Estimated Creatinine Clearance: 29 mL/min (by C-G formula based on SCr of 1.64 mg/dL (H)).  Assessment: 65yof continues on coumadin for afib. Was on amiodarone 200mg  bid pta, received IV amiodarone load and is now back on PO 400mg  daily. INR is therapeutic at 2.74. Appears last night's dose was not given.  Patient reported home dose as 2.5mg  daily except 5mg  on MWF Last clinic note 07/05/16: coumadin dose was 2.5mg  daily except 5mg  MW  Goal of Therapy:  INR 2-3 Monitor platelets by anticoagulation protocol: Yes   Plan:  1) Coumadin 2.5mg  x 1 2) Daily INR 3) Watch DI with amiodarone  Deboraha Sprang 07/29/2016,9:10 AM

## 2016-07-29 NOTE — Care Management Note (Signed)
Case Management Note  Patient Details  Name: Kristi Parker MRN: [id number] Date of Birth: Jan 05, 1951  Subjective/Objective:        CM following for progression and d/c planning.           Action/Plan: 07/29/2016 Plan for d/c to home today, no HH or DME needs identified, per pt RN.   Expected Discharge Date:     07/29/2016             Expected Discharge Plan:  Home/Self Care  In-House Referral:  NA  Discharge planning Services  NA  Post Acute Care Choice:  NA Choice offered to:  NA  DME Arranged:  N/A DME Agency:  NA  HH Arranged:  NA HH Agency:  NA  Status of Service:  Completed, signed off  If discussed at [place] of Stay Meetings, dates discussed:    Additional Comments:  Adron Bene, RN 07/29/2016, 11:12 AM

## 2016-08-01 ENCOUNTER — Telehealth: Payer: Self-pay | Admitting: Internal Medicine

## 2016-08-01 NOTE — Telephone Encounter (Signed)
New Message  Pt call requesting to speak with RN about getting instructions on setting a remote device check. Please call back to discuss

## 2016-08-01 NOTE — Telephone Encounter (Signed)
Kristi Parker wanted to be sure there was nothing to do with the monitor but plug it in at bedside. I confirmed that she was correct and that we will review her monitor at her wound check appt on 08/10/16. She verbalizes understanding.

## 2016-08-02 ENCOUNTER — Encounter (HOSPITAL_COMMUNITY): Payer: Self-pay | Admitting: Cardiology

## 2016-08-02 ENCOUNTER — Telehealth: Payer: Self-pay

## 2016-08-02 NOTE — Telephone Encounter (Signed)
Returned patient call regarding next ICM remote transmission.  Advised since the device was changed last week that it takes 6-8 weeks for the fluid level to form a baseline and next ICM remote transmission will be 10/03/2016.  Encouraged her to call before that time for any fluid symptoms.  She has direct ICM number.

## 2016-08-09 ENCOUNTER — Other Ambulatory Visit (HOSPITAL_COMMUNITY): Payer: Medicare Other

## 2016-08-10 ENCOUNTER — Ambulatory Visit (INDEPENDENT_AMBULATORY_CARE_PROVIDER_SITE_OTHER): Payer: Medicare Other | Admitting: Internal Medicine

## 2016-08-10 ENCOUNTER — Ambulatory Visit (INDEPENDENT_AMBULATORY_CARE_PROVIDER_SITE_OTHER): Payer: Medicare Other | Admitting: Pharmacist

## 2016-08-10 ENCOUNTER — Encounter: Payer: Self-pay | Admitting: Internal Medicine

## 2016-08-10 VITALS — BP 108/68 | HR 75 | Ht 63.0 in | Wt 134.6 lb

## 2016-08-10 DIAGNOSIS — I4819 Other persistent atrial fibrillation: Secondary | ICD-10-CM

## 2016-08-10 DIAGNOSIS — I4891 Unspecified atrial fibrillation: Secondary | ICD-10-CM | POA: Diagnosis not present

## 2016-08-10 DIAGNOSIS — I420 Dilated cardiomyopathy: Secondary | ICD-10-CM | POA: Diagnosis not present

## 2016-08-10 DIAGNOSIS — I481 Persistent atrial fibrillation: Secondary | ICD-10-CM

## 2016-08-10 DIAGNOSIS — Z5181 Encounter for therapeutic drug level monitoring: Secondary | ICD-10-CM

## 2016-08-10 DIAGNOSIS — I442 Atrioventricular block, complete: Secondary | ICD-10-CM

## 2016-08-10 DIAGNOSIS — I428 Other cardiomyopathies: Secondary | ICD-10-CM

## 2016-08-10 DIAGNOSIS — I472 Ventricular tachycardia, unspecified: Secondary | ICD-10-CM

## 2016-08-10 DIAGNOSIS — I5022 Chronic systolic (congestive) heart failure: Secondary | ICD-10-CM | POA: Diagnosis not present

## 2016-08-10 LAB — CUP PACEART INCLINIC DEVICE CHECK
Battery Remaining Longevity: 80 mo
Battery Voltage: 3.07 V
Brady Statistic AP VP Percent: 99.9 %
Brady Statistic AP VS Percent: 0.07 %
Brady Statistic AS VP Percent: 0.03 %
Brady Statistic AS VS Percent: 0 %
Brady Statistic RA Percent Paced: 99.76 %
Brady Statistic RV Percent Paced: 98.82 %
Date Time Interrogation Session: 20171227115321
HighPow Impedance: 32 Ohm
Implantable Lead Implant Date: 20091104
Implantable Lead Implant Date: 20091104
Implantable Lead Implant Date: 20171214
Implantable Lead Location: 753858
Implantable Lead Location: 753859
Implantable Lead Location: 753860
Implantable Lead Model: 4196
Implantable Lead Model: 5076
Implantable Pulse Generator Implant Date: 20171214
Lead Channel Impedance Value: 399 Ohm
Lead Channel Impedance Value: 399 Ohm
Lead Channel Impedance Value: 475 Ohm
Lead Channel Impedance Value: 532 Ohm
Lead Channel Impedance Value: 551 Ohm
Lead Channel Impedance Value: 950 Ohm
Lead Channel Pacing Threshold Amplitude: 0.5 V
Lead Channel Pacing Threshold Amplitude: 1.375 V
Lead Channel Pacing Threshold Pulse Width: 0.4 ms
Lead Channel Pacing Threshold Pulse Width: 0.6 ms
Lead Channel Sensing Intrinsic Amplitude: 4 mV
Lead Channel Sensing Intrinsic Amplitude: 4 mV
Lead Channel Setting Pacing Amplitude: 2 V
Lead Channel Setting Pacing Amplitude: 3.25 V
Lead Channel Setting Pacing Amplitude: 3.5 V
Lead Channel Setting Pacing Pulse Width: 0.4 ms
Lead Channel Setting Pacing Pulse Width: 0.6 ms
Lead Channel Setting Sensing Sensitivity: 0.3 mV

## 2016-08-10 LAB — T4, FREE: Free T4: 1.3 ng/dL (ref 0.8–1.8)

## 2016-08-10 LAB — T3, FREE: T3, Free: 2.1 pg/mL — ABNORMAL LOW (ref 2.3–4.2)

## 2016-08-10 LAB — POCT INR: INR: 6.6

## 2016-08-10 NOTE — Patient Instructions (Signed)
Medication Instructions:  Your physician has recommended you make the following change in your medication:  1) Decrease Amiodarone to 200 mg daily Thurs   Labwork: Your physician recommends that you return for lab work today: Free T3 and Free T4   Testing/Procedures: None ordered   Follow-Up: Your physician recommends that you schedule a follow-up appointment in: 6 weeks with Chanetta Marshall, NP   Any Other Special Instructions Will Be Listed Below (If Applicable).     If you need a refill on your cardiac medications before your next appointment, please call your pharmacy.

## 2016-08-10 NOTE — Progress Notes (Signed)
Electrophysiology Office Note Date: 08/10/2016  ID:  Kristi Parker, DOB Jul 30, 1951, MRN [id number]  PCP: Irven Shelling, MD Primary Cardiologist: Marlou Porch Electrophysiologist: Taiki Buckwalter  CC: VT follow up  Kristi Parker is a 65 y.o. female seen today for post hospital follow up. She was recently admitted for sustained symptomatic ventricular tachycardia. She was pace terminated, loaded on IV amiodarone and underwent CRTD upgrade that admission.  Since discharge, the patient reports doing relatively well.  Her energy level is improving and she hasn't had sustained palpitations.  She has not yet returned to [place].  She denies chest pain, palpitations, dyspnea, PND, orthopnea, nausea, vomiting, dizziness, syncope, edema, weight gain, or early satiety.  She has not had ICD shocks.   Device History: Single chamber pacemaker implanted 2003; upgrade to MDT CRTP 2009; gen change 2016; upgrade to CRTD 2017 for NICM, sustained VT History of appropriate therapy: No History of AAD therapy: Yes - amiodarone    Past Medical History:  Diagnosis Date  . Adenomatous colon polyp   . Cervical radiculopathy at C5   . Chronic systolic heart failure ([place])   . CKD (chronic kidney disease), stage III   . DDD (degenerative disc disease), cervical   . DDD (degenerative disc disease), lumbar   . Fibroid   . GERD (gastroesophageal reflux disease)   . Hearing loss 02/2010   L hearing loss/vertigo, steroids  . History of seizure disorder    Related Hx  . Mitral regurgitation    moderate  . Multiple sclerosis ([place]) 1984  . Nonischemic cardiomyopathy (H[facility])    Moderate LVEF 35-40% by ECHO 2011, 25-30% by echo 2013  . Permanent atrial fibrillation ([place])   . Pulmonary hypertension, secondary 06/04/2013   As a result of nonischemic cardiomyopathy EF 25-30%  . Stroke [facility])    Right brain CVA, complete recovery 07/2003  . Ventricular tachycardia ([place])   . Vertigo 02/2010   L hearing loss/vertigo,  steroids   Past Surgical History:  Procedure Laterality Date  . BIV PACEMAKER GENERATOR CHANGE OUT N/A 09/18/2014   Procedure: BIV PACEMAKER GENERATOR CHANGE OUT;  Surgeon: Thompson Grayer, MD;  Location: [facility] C[facility] LAB;  Service: Cardiovascular;  Laterality: N/A;  . DILATION AND CURETTAGE OF UTERUS    . EP IMPLANTABLE DEVICE N/A 07/28/2016   Procedure: ICD Implant;  Surgeon: Evans Lance, MD;  Location: [facility] CV LAB;  Service: Cardiovascular;  Laterality: N/A;  . HYSTEROSCOPY    . PACEMAKER INSERTION     PTVP 08/2001 for complete heart block. Upgrade PTVP to MDT BiV 06/2008 by Dr Leonia Reeves  . TUBAL LIGATION      Current Outpatient Prescriptions  Medication Sig Dispense Refill  . acetaminophen (TYLENOL) 500 MG tablet Take 500-1,000 mg by mouth every 6 (six) hours as needed (for back pain).    [name] amiodarone (PACERONE) 200 MG tablet Take 1 tablet (200 mg total) by mouth daily. Start by taking 2 tablets (400mg ) by moth daily for 2 weeks, then reduce to 1 tablet (200mg ) daily. 60 tablet 3  . BIOTIN PO Take 1 capsule by mouth daily.    . Calcium Carbonate-Vitamin D (CALCIUM + D PO) Take 1 tablet by mouth daily.     . carvedilol (COREG) 12.5 MG tablet Take 0.5 tablets (6.25 mg total) by mouth 2 (two) times daily. 180 tablet 3  . COUMADIN 5 MG tablet TAKE AS DIRECTED BY THE COUMADIN CLINIC (Patient taking differently: 2.5 mg by mouth at bedtime on Sun/Tues/Thurs/Sat and  5 mg on Mon/Wed/Fri) 30 tablet 3  . fexofenadine (ALLEGRA) 180 MG tablet Take 180 mg by mouth daily as needed for allergies.     . furosemide (LASIX) 40 MG tablet Take 1 tablet (40 mg total) by mouth daily. May take extra 1/2 tablet as needed for swelling (Patient taking differently: Take 40 mg by mouth daily. May take extra 1/2 tablet (20 mg) as needed for swelling) 105 tablet 3  . lisinopril (PRINIVIL,ZESTRIL) 2.5 MG tablet Take 1 tablet (2.5 mg total) by mouth daily. 90 tablet 3  . loratadine (CLARITIN) 10 MG tablet Take 10 mg by  mouth daily as needed for allergies.    [name] MAGNESIUM PO Take 1 tablet by mouth daily.    [name] spironolactone (ALDACTONE) 25 MG tablet Take 0.5 tablets (12.5 mg total) by mouth daily. 45 tablet 3   No current facility-administered medications for this visit.     Allergies:   Patient has no known allergies.   Social History: Social History   Social History  . Marital status: Married    Spouse name: N/A  . Number of children: N/A  . Years of education: N/A   Occupational History  . Not on file.   Social History Main Topics  . Smoking status: Never Smoker  . Smokeless tobacco: Never Used  . Alcohol use Yes     Comment: rare  . Drug use: Unknown  . Sexual activity: Yes    Birth control/ protection: Post-menopausal   Other Topics Concern  . Not on file   Social History Narrative  . No narrative on file    Family History: Family History  Problem Relation Age of Onset  . Diabetes Mother   . Multiple sclerosis Mother   . Cancer Father     ?  [name] CAD      Father's side CAD    Review of Systems: All other systems reviewed and are otherwise negative except as noted above.   Physical Exam: VS:  BP 108/68   Pulse 75   Ht 5\' 3"  (1.6 m)   Wt 134 lb 9.6 oz (61.1 kg)   LMP  (LMP Unknown)   SpO2 98%   BMI 23.84 kg/m  , BMI Body mass index is 23.84 kg/m.  GEN- The patient is well appearing, alert and oriented x 3 today.   HEENT: normocephalic, atraumatic; sclera clear, conjunctiva pink; hearing intact; oropharynx clear; neck supple  Lungs- Clear to ausculation bilaterally, normal work of breathing.  No wheezes, rales, rhonchi Heart- Regular rate and rhythm (paced) GI- soft, non-tender, non-distended, bowel sounds present  Extremities- no clubbing, cyanosis, or edema  MS- no significant deformity or atrophy Skin- warm and dry, no rash or lesion; ICD pocket well healed - one small area of superficial bleeding from steri-strip removal.  Psych- euthymic mood, full  affect Neuro- strength and sensation are intact  ICD interrogation- reviewed in detail today,  See PACEART report  EKG:  EKG is ordered today. The ekg ordered today shows AV pacing   Recent Labs: 07/27/2016: ALT 25; B Natriuretic Peptide 281.5; Magnesium 2.1; TSH 4.991 07/28/2016: BUN 11; Creatinine, Ser 1.64; Hemoglobin 10.3; Platelets 134; Potassium 3.5; Sodium 140   Wt Readings from Last 3 Encounters:  08/10/16 134 lb 9.6 oz (61.1 kg)  07/27/16 137 lb 2 oz (62.2 kg)  07/21/16 139 lb 6.4 oz (63.2 kg)     Other studies Reviewed: Additional studies/ records that were reviewed today include: hospital records   Assessment  and Plan:  1.  Ventricular tachycardia Doing well post CRTD upgrade Decrease amiodarone to 200mg  daily on Thursday LFT's recently normal, TSH elevated, will check FT4 and FT3 today  No driving x6 months  2. Chronic systolic dysfunction euvolemic today Stable on an appropriate medical regimen Normal ICD function See Pace Art report No changes today  3.  Persistent atrial fibrillation Currently in SR Continue Warfarin long term for CHADS2VASC of 5   Current medicines are reviewed at length with the patient today.   The patient does not have concerns regarding her medicines.  The following changes were made today:  none  Labs/ tests ordered today include:  Orders Placed This Encounter  Procedures  . T3, free  . T4, free  . Implantable device check  . EKG 12-Lead     Disposition:   Follow up with EP NP in 6 weeks     Signed, Thompson Grayer, MD  08/10/2016 4:42 PM  [place] [address] [phone] (office) [phone] (fax)

## 2016-08-11 ENCOUNTER — Ambulatory Visit: Payer: Medicare Other

## 2016-08-13 ENCOUNTER — Other Ambulatory Visit: Payer: Self-pay | Admitting: Cardiology

## 2016-08-16 ENCOUNTER — Telehealth: Payer: Self-pay | Admitting: Internal Medicine

## 2016-08-16 NOTE — Telephone Encounter (Signed)
New message      Pt received a box to check her device while in the hosp.  She also received another box since she has been home.  Talk to someone to find out which box she needs to keep.  They are different

## 2016-08-17 LAB — CUP PACEART REMOTE DEVICE CHECK
Battery Remaining Longevity: 64 mo
Battery Voltage: 3.01 V
Brady Statistic AP VP Percent: 0 %
Brady Statistic AP VS Percent: 0 %
Brady Statistic AS VP Percent: 96.02 %
Brady Statistic AS VS Percent: 3.98 %
Brady Statistic RA Percent Paced: 0 %
Brady Statistic RV Percent Paced: 91.35 %
Date Time Interrogation Session: 20171201194345
Implantable Pulse Generator Implant Date: 20160204
Lead Channel Impedance Value: 285 Ohm
Lead Channel Impedance Value: 323 Ohm
Lead Channel Impedance Value: 380 Ohm
Lead Channel Impedance Value: 380 Ohm
Lead Channel Impedance Value: 532 Ohm
Lead Channel Impedance Value: 551 Ohm
Lead Channel Impedance Value: 627 Ohm
Lead Channel Impedance Value: 627 Ohm
Lead Channel Impedance Value: 950 Ohm
Lead Channel Pacing Threshold Amplitude: 0.75 V
Lead Channel Pacing Threshold Pulse Width: 0.4 ms
Lead Channel Sensing Intrinsic Amplitude: 0.25 mV
Lead Channel Sensing Intrinsic Amplitude: 0.25 mV
Lead Channel Sensing Intrinsic Amplitude: 13.25 mV
Lead Channel Sensing Intrinsic Amplitude: 13.25 mV
Lead Channel Setting Pacing Amplitude: 2.5 V
Lead Channel Setting Pacing Amplitude: 2.5 V
Lead Channel Setting Pacing Pulse Width: 0.4 ms
Lead Channel Setting Pacing Pulse Width: 0.6 ms
Lead Channel Setting Sensing Sensitivity: 5.6 mV

## 2016-08-18 NOTE — Telephone Encounter (Signed)
Late entry.  Kristi Parker had contacted [employer] to get her monitor questions answered. I confirmed the SN of correct monitor and ensured it was in Carelink appropriately.

## 2016-08-18 NOTE — Telephone Encounter (Signed)
Call back to patient and requested she send a remote transmission with the 24950 as requested by [employer] rep.  She stated she would do so.

## 2016-08-18 NOTE — Telephone Encounter (Signed)
Call to patient.  She reported she had received the [id number] monitor when she left the hospital at time of implant and then received a new monitor in the mail, model [id number].  She said [employer] told her the physician had ordered it.  She is unsure which monitor to use.  She attempted to turn on the new model during conversation but would not turn on.  Advised I would check with rep in the area for more information and call her back.

## 2016-08-19 ENCOUNTER — Encounter: Payer: Medicare Other | Admitting: Internal Medicine

## 2016-08-19 ENCOUNTER — Ambulatory Visit (INDEPENDENT_AMBULATORY_CARE_PROVIDER_SITE_OTHER): Payer: Medicare Other | Admitting: *Deleted

## 2016-08-19 ENCOUNTER — Telehealth: Payer: Self-pay | Admitting: Cardiology

## 2016-08-19 DIAGNOSIS — Z5181 Encounter for therapeutic drug level monitoring: Secondary | ICD-10-CM

## 2016-08-19 DIAGNOSIS — I4891 Unspecified atrial fibrillation: Secondary | ICD-10-CM | POA: Diagnosis not present

## 2016-08-19 LAB — POCT INR: INR: 4.6

## 2016-08-19 NOTE — Telephone Encounter (Signed)
Spoke w/ pt and informed her that the correct serial number for her home monitor is in her carelink profile. Pt verbalized understanding and is going to call tech services back.

## 2016-08-19 NOTE — Telephone Encounter (Addendum)
Spoke with patient and explained the serial number for the [id number] will need to be entered in order for her to be able to send a transmission.  Advised at this time the [id number] is listed in the system.  Explained she received 2 monitors because our office ordered one not knowing she had already received a monitor after her procedure. She said she would rather keep the newest model.  Advised her to call [employer] tech service number and they will work with her regarding which monitor she would like to keep and help her transmit a report.

## 2016-08-25 ENCOUNTER — Telehealth: Payer: Self-pay | Admitting: Internal Medicine

## 2016-08-25 NOTE — Telephone Encounter (Signed)
F/u Message  Pt call requesting to speak with RN about results. Please call back to discuss

## 2016-08-25 NOTE — Telephone Encounter (Signed)
Spoke with patient and gave her the lab results

## 2016-08-29 ENCOUNTER — Ambulatory Visit (INDEPENDENT_AMBULATORY_CARE_PROVIDER_SITE_OTHER): Payer: Medicare Other

## 2016-08-29 DIAGNOSIS — Z5181 Encounter for therapeutic drug level monitoring: Secondary | ICD-10-CM

## 2016-08-29 DIAGNOSIS — I4891 Unspecified atrial fibrillation: Secondary | ICD-10-CM | POA: Diagnosis not present

## 2016-08-29 LAB — POCT INR: INR: 2.8

## 2016-09-02 ENCOUNTER — Encounter: Payer: Self-pay | Admitting: Cardiology

## 2016-09-12 ENCOUNTER — Ambulatory Visit (INDEPENDENT_AMBULATORY_CARE_PROVIDER_SITE_OTHER): Payer: Medicare Other

## 2016-09-12 DIAGNOSIS — I4891 Unspecified atrial fibrillation: Secondary | ICD-10-CM

## 2016-09-12 DIAGNOSIS — Z5181 Encounter for therapeutic drug level monitoring: Secondary | ICD-10-CM | POA: Diagnosis not present

## 2016-09-12 LAB — POCT INR: INR: 2.4

## 2016-09-21 NOTE — Progress Notes (Signed)
Electrophysiology Office Note Date: 09/22/2016  ID:  Kristi Parker, DOB [birth year], MRN [id number]  PCP: Irven Shelling, MD Primary Cardiologist: Marlou Porch Electrophysiologist: Allred  CC: VT follow-up  Kristi Parker is a 66 y.o. female seen today for Dr Rayann Heman.  She presents today for routine electrophysiology followup. She was recently admitted for sustained symptomatic ventricular tachycardia that was easily pace terminated through pacemaker. She underwent upgrade of CRTP to CRTD that admission.  Since hospital discharge, the patient reports doing very well. She denies chest pain, palpitations, dyspnea, PND, orthopnea, nausea, vomiting, dizziness, syncope, edema, weight gain, or early satiety.  She has not had ICD shocks. She has returned to working out at [employer].   Device History: MDT single chamber PPM implanted 2003; upgrade to CRTP 2009; gen change 2016; CRTD implanted 2017 for VT History of appropriate therapy: No History of AAD therapy: Yes   Past Medical History:  Diagnosis Date  . Adenomatous colon polyp   . Cervical radiculopathy at C5   . Chronic systolic heart failure ([place])   . CKD (chronic kidney disease), stage III   . DDD (degenerative disc disease), cervical   . DDD (degenerative disc disease), lumbar   . Fibroid   . GERD (gastroesophageal reflux disease)   . Hearing loss 02/2010   L hearing loss/vertigo, steroids  . History of seizure disorder    Related Hx  . Mitral regurgitation    moderate  . Multiple sclerosis ([place]) 1984  . Nonischemic cardiomyopathy (H[facility])    Moderate LVEF 35-40% by ECHO 2011, 25-30% by echo 2013  . Permanent atrial fibrillation ([place])   . Pulmonary hypertension, secondary 06/04/2013   As a result of nonischemic cardiomyopathy EF 25-30%  . Stroke [facility])    Right brain CVA, complete recovery 07/2003  . Ventricular tachycardia ([place])   . Vertigo 02/2010   L hearing loss/vertigo, steroids   Past Surgical History:  Procedure  Laterality Date  . BIV PACEMAKER GENERATOR CHANGE OUT N/A 09/18/2014   Procedure: BIV PACEMAKER GENERATOR CHANGE OUT;  Surgeon: Thompson Grayer, MD;  Location: [facility] CATH LAB;  Service: Cardiovascular;  Laterality: N/A;  . DILATION AND CURETTAGE OF UTERUS    . EP IMPLANTABLE DEVICE N/A 07/28/2016   Procedure: ICD Implant;  Surgeon: Evans Lance, MD;  Location: [facility] CV LAB;  Service: Cardiovascular;  Laterality: N/A;  . HYSTEROSCOPY    . PACEMAKER INSERTION     PTVP 08/2001 for complete heart block. Upgrade PTVP to MDT BiV 06/2008 by Dr Leonia Reeves  . TUBAL LIGATION      Current Outpatient Prescriptions  Medication Sig Dispense Refill  . acetaminophen (TYLENOL) 500 MG tablet Take 500-1,000 mg by mouth every 6 (six) hours as needed (for back pain).    [name] amiodarone (PACERONE) 200 MG tablet Take 1 tablet (200 mg total) by mouth daily. Start by taking 2 tablets (400mg ) by moth daily for 2 weeks, then reduce to 1 tablet (200mg ) daily. 60 tablet 3  . BIOTIN PO Take 1 capsule by mouth daily.    . Calcium Carbonate-Vitamin D (CALCIUM + D PO) Take 1 tablet by mouth daily.     . carvedilol (COREG) 12.5 MG tablet Take 0.5 tablets (6.25 mg total) by mouth 2 (two) times daily. 180 tablet 3  . COUMADIN 5 MG tablet TAKE AS DIRECTED 30 tablet 2  . fexofenadine (ALLEGRA) 180 MG tablet Take 180 mg by mouth daily as needed for allergies.     . furosemide (LASIX)  40 MG tablet Take 1 tablet (40 mg total) by mouth daily. May take extra 1/2 tablet as needed for swelling (Patient taking differently: Take 40 mg by mouth daily. May take extra 1/2 tablet (20 mg) as needed for swelling) 105 tablet 3  . lisinopril (PRINIVIL,ZESTRIL) 2.5 MG tablet Take 1 tablet (2.5 mg total) by mouth daily. 90 tablet 3  . loratadine (CLARITIN) 10 MG tablet Take 10 mg by mouth daily as needed for allergies.    [name] MAGNESIUM PO Take 1 tablet by mouth daily.    [name] spironolactone (ALDACTONE) 25 MG tablet Take 0.5 tablets (12.5 mg total) by mouth  daily. 45 tablet 3   No current facility-administered medications for this visit.     Allergies:   Patient has no known allergies.   Social History: Social History   Social History  . Marital status: Married    Spouse name: N/A  . Number of children: N/A  . Years of education: N/A   Occupational History  . Not on file.   Social History Main Topics  . Smoking status: Never Smoker  . Smokeless tobacco: Never Used  . Alcohol use Yes     Comment: rare  . Drug use: Unknown  . Sexual activity: Yes    Birth control/ protection: Post-menopausal   Other Topics Concern  . Not on file   Social History Narrative  . No narrative on file    Family History: Family History  Problem Relation Age of Onset  . Diabetes Mother   . Multiple sclerosis Mother   . Cancer Father     ?  [name] CAD      Father's side CAD    Review of Systems: All other systems reviewed and are otherwise negative except as noted above.   Physical Exam: VS:  BP 106/62   Pulse 80   Ht 5' 3.25" (1.607 m)   Wt 136 lb (61.7 kg)   LMP  (LMP Unknown)   BMI 23.90 kg/m  , BMI Body mass index is 23.9 kg/m.  GEN- The patient is well appearing, alert and oriented x 3 today.   HEENT: normocephalic, atraumatic; sclera clear, conjunctiva pink; hearing intact; oropharynx clear; neck supple  Lungs- Clear to ausculation bilaterally, normal work of breathing.  No wheezes, rales, rhonchi Heart- Regular rate and rhythm (paced) GI- soft, non-tender, non-distended, bowel sounds present  Extremities- no clubbing, cyanosis, or edema  MS- no significant deformity or atrophy Skin- warm and dry, no rash or lesion; ICD pocket well healed Psych- euthymic mood, full affect Neuro- strength and sensation are intact  ICD interrogation- reviewed in detail today,  See PACEART report  EKG:  EKG is not ordered today.  Recent Labs: 07/27/2016: ALT 25; B Natriuretic Peptide 281.5; Magnesium 2.1; TSH 4.991 07/28/2016: BUN 11;  Creatinine, Ser 1.64; Hemoglobin 10.3; Platelets 134; Potassium 3.5; Sodium 140   Wt Readings from Last 3 Encounters:  09/22/16 136 lb (61.7 kg)  08/10/16 134 lb 9.6 oz (61.1 kg)  07/27/16 137 lb 2 oz (62.2 kg)     Other studies Reviewed: Additional studies/ records that were reviewed today include: hospital records   Assessment and Plan:  1.  Ventricular tachycardia No recurrence since discharge Continue amiodarone 200mg  daily. Recent TSH, LFT's reviewed. Annual eye exams recommended No driving x6 months (09/2977)  2.  Chronic systolic dysfunction euvolemic today Stable on an appropriate medical regimen Normal ICD function See Pace Art report No changes today Continue follow up in ICM  clinic  3.  CHB Normal device function  4.  Persistent atrial fibrillation Burden by device interrogation <1% V rates controlled Continue Warfarin for CHADS2VASC of 5   Current medicines are reviewed at length with the patient today.   The patient does not have concerns regarding her medicines.  The following changes were made today:  none  Labs/ tests ordered today include: none No orders of the defined types were placed in this encounter.    Disposition:   Follow up with Dr Marlou Porch and Dr Rayann Heman as scheduled, ICM clinic     Signed, Chanetta Marshall, NP 09/22/2016 9:54 AM  [place] [address] [phone] (office) [phone] (fax)

## 2016-09-22 ENCOUNTER — Ambulatory Visit (INDEPENDENT_AMBULATORY_CARE_PROVIDER_SITE_OTHER): Payer: Medicare Other | Admitting: Nurse Practitioner

## 2016-09-22 VITALS — BP 106/62 | HR 80 | Ht 63.25 in | Wt 136.0 lb

## 2016-09-22 DIAGNOSIS — I4819 Other persistent atrial fibrillation: Secondary | ICD-10-CM

## 2016-09-22 DIAGNOSIS — I442 Atrioventricular block, complete: Secondary | ICD-10-CM

## 2016-09-22 DIAGNOSIS — I5022 Chronic systolic (congestive) heart failure: Secondary | ICD-10-CM

## 2016-09-22 DIAGNOSIS — I472 Ventricular tachycardia, unspecified: Secondary | ICD-10-CM

## 2016-09-22 DIAGNOSIS — I481 Persistent atrial fibrillation: Secondary | ICD-10-CM

## 2016-09-22 LAB — CUP PACEART INCLINIC DEVICE CHECK
Date Time Interrogation Session: 20180208101050
Implantable Lead Implant Date: 20091104
Implantable Lead Implant Date: 20091104
Implantable Lead Implant Date: 20171214
Implantable Lead Location: 753858
Implantable Lead Location: 753859
Implantable Lead Location: 753860
Implantable Lead Model: 4196
Implantable Lead Model: 5076
Implantable Pulse Generator Implant Date: 20171214

## 2016-09-22 NOTE — Patient Instructions (Addendum)
Medication Instructions:     If you need a refill on your cardiac medications before your next appointment, please call your pharmacy.  Labwork:   Testing/Procedures:    Follow-Up: AS SCHEDULED  ALREADY   Any Other Special Instructions Will Be Listed Below (If Applicable).

## 2016-09-30 ENCOUNTER — Ambulatory Visit (INDEPENDENT_AMBULATORY_CARE_PROVIDER_SITE_OTHER): Payer: Medicare Other | Admitting: Pharmacist

## 2016-09-30 DIAGNOSIS — Z5181 Encounter for therapeutic drug level monitoring: Secondary | ICD-10-CM | POA: Diagnosis not present

## 2016-09-30 DIAGNOSIS — I4891 Unspecified atrial fibrillation: Secondary | ICD-10-CM

## 2016-09-30 LAB — POCT INR: INR: 3.2

## 2016-10-03 ENCOUNTER — Ambulatory Visit (INDEPENDENT_AMBULATORY_CARE_PROVIDER_SITE_OTHER): Payer: Medicare Other

## 2016-10-03 ENCOUNTER — Telehealth: Payer: Self-pay | Admitting: Cardiology

## 2016-10-03 DIAGNOSIS — Z95810 Presence of automatic (implantable) cardiac defibrillator: Secondary | ICD-10-CM | POA: Diagnosis not present

## 2016-10-03 DIAGNOSIS — I5022 Chronic systolic (congestive) heart failure: Secondary | ICD-10-CM | POA: Diagnosis not present

## 2016-10-03 NOTE — Telephone Encounter (Signed)
Spoke with pt and reminded pt of remote transmission that is due today. Pt verbalized understanding.   

## 2016-10-03 NOTE — Progress Notes (Signed)
EPIC Encounter for ICM Monitoring  Patient Name: Kristi Parker is a 66 y.o. female Date: 10/03/2016 Primary Care Physican: Irven Shelling, MD Primary Cardiologist:[place] Electrophysiologist: Allred Dry Weight:   129.2 lbs Bi-V Pacing:  99.2%       Heart Failure questions reviewed, pt asymptomatic.   Thoracic impedance slightly below baseline suggesting some fluid accumulation.  Current prescribed dose of Furosemide 40 mg 1 tablet daily and may take additional 1/2 tablet (20 mg total) for fluid symptoms  Recommendations: No changes. Reminded to limit dietary salt intake to 2000 mg/day and fluid intake to < 2 liters/day. Encouraged to call for fluid symptoms.  Follow-up plan: ICM clinic phone appointment on 10/17/2016 to recheck fluid level.  Office appointment with Dr Rayann Heman 10/31/2016 and Dr Marlou Porch 10/26/2016.  Copy of ICM check sent to cardiologist and device physician.   3 month ICM trend: 10/03/2016   1 Year ICM trend:      [name], RN 10/03/2016 3:50 PM

## 2016-10-17 ENCOUNTER — Ambulatory Visit (INDEPENDENT_AMBULATORY_CARE_PROVIDER_SITE_OTHER): Payer: Medicare Other | Admitting: *Deleted

## 2016-10-17 ENCOUNTER — Other Ambulatory Visit: Payer: Self-pay | Admitting: Cardiology

## 2016-10-17 DIAGNOSIS — Z95810 Presence of automatic (implantable) cardiac defibrillator: Secondary | ICD-10-CM

## 2016-10-17 DIAGNOSIS — I5022 Chronic systolic (congestive) heart failure: Secondary | ICD-10-CM | POA: Diagnosis not present

## 2016-10-17 DIAGNOSIS — I472 Ventricular tachycardia, unspecified: Secondary | ICD-10-CM

## 2016-10-17 MED ORDER — AMIODARONE HCL 200 MG PO TABS: 200.0000 mg | ORAL_TABLET | Freq: Every day | ORAL | 3 refills | Status: DC

## 2016-10-17 NOTE — Progress Notes (Signed)
EPIC Encounter for ICM Monitoring  Patient Name: Kristi Parker is a 66 y.o. female Date: 10/17/2016 Primary Care Physican: Irven Shelling, MD Primary Cardiologist:[place] Electrophysiologist: Allred Dry Weight:130.2 lbs Bi-V Pacing: 99.4%                                                          Heart Failure questions reviewed, pt symptomatic with fluctuation in weight by 2 lbs.   Thoracic impedance abnormal suggesting fluid accumulation and she did take extra 1/2 tablet of Furosemide 2 days ago.  Prescribed and confirmed dosage: Furosemide 40 mg 1 tablet daily and may take extra .5 tablet (20 mg total) if needed.  Labs: 07/28/2016 Creatinine 1.64, BUN 11, Potassium 3.5, Sodium 140, EGFR 32-37 07/27/2017 Creatinine 1.88, BUN 18, Potassium 3.9, Sodium 140, EGFR 27-31  Recommendations: She still feels like she has some fluid.  She will take extra 1/2 tablet Furosemide today and tomorrow as prescribed.  Reminded to limit dietary salt intake. Encouraged to call for fluid symptoms.  Follow-up plan: ICM clinic phone appointment on 12/01/2016.  Office appointment with Dr Marlou Porch on 10/26/2016 and defib check office appointment 10/31/2016 with Dr Rayann Heman.   Copy of ICM check sent to primary cardiologist and device physician.   3 month ICM trend: 10/17/2016   1 Year ICM trend:      [name], RN 10/17/2016 12:06 PM

## 2016-10-18 ENCOUNTER — Encounter: Payer: Self-pay | Admitting: Cardiology

## 2016-10-18 LAB — CUP PACEART REMOTE DEVICE CHECK
Battery Remaining Longevity: 91 mo
Battery Voltage: 3.05 V
Brady Statistic AP VP Percent: 99.88 %
Brady Statistic AP VS Percent: 0.08 %
Brady Statistic AS VP Percent: 0.04 %
Brady Statistic AS VS Percent: 0 %
Brady Statistic RA Percent Paced: 99.7 %
Brady Statistic RV Percent Paced: 99.41 %
Date Time Interrogation Session: 20180305072823
HighPow Impedance: 36 Ohm
Implantable Lead Implant Date: 20091104
Implantable Lead Implant Date: 20091104
Implantable Lead Implant Date: 20171214
Implantable Lead Location: 753858
Implantable Lead Location: 753859
Implantable Lead Location: 753860
Implantable Lead Model: 4196
Implantable Lead Model: 5076
Implantable Pulse Generator Implant Date: 20171214
Lead Channel Impedance Value: 1064 Ohm
Lead Channel Impedance Value: 399 Ohm
Lead Channel Impedance Value: 418 Ohm
Lead Channel Impedance Value: 551 Ohm
Lead Channel Impedance Value: 551 Ohm
Lead Channel Impedance Value: 608 Ohm
Lead Channel Pacing Threshold Amplitude: 0.625 V
Lead Channel Pacing Threshold Amplitude: 1.25 V
Lead Channel Pacing Threshold Pulse Width: 0.4 ms
Lead Channel Pacing Threshold Pulse Width: 0.6 ms
Lead Channel Sensing Intrinsic Amplitude: 0.375 mV
Lead Channel Sensing Intrinsic Amplitude: 0.375 mV
Lead Channel Sensing Intrinsic Amplitude: 6.875 mV
Lead Channel Sensing Intrinsic Amplitude: 6.875 mV
Lead Channel Setting Pacing Amplitude: 1.75 V
Lead Channel Setting Pacing Amplitude: 2 V
Lead Channel Setting Pacing Amplitude: 2.5 V
Lead Channel Setting Pacing Pulse Width: 0.4 ms
Lead Channel Setting Pacing Pulse Width: 0.6 ms
Lead Channel Setting Sensing Sensitivity: 0.3 mV

## 2016-10-18 NOTE — Progress Notes (Signed)
Remote ICD transmission.   

## 2016-10-21 ENCOUNTER — Ambulatory Visit (INDEPENDENT_AMBULATORY_CARE_PROVIDER_SITE_OTHER): Payer: Medicare Other | Admitting: *Deleted

## 2016-10-21 DIAGNOSIS — I4891 Unspecified atrial fibrillation: Secondary | ICD-10-CM

## 2016-10-21 DIAGNOSIS — Z5181 Encounter for therapeutic drug level monitoring: Secondary | ICD-10-CM

## 2016-10-21 LAB — POCT INR: INR: 4

## 2016-10-26 ENCOUNTER — Encounter: Payer: Self-pay | Admitting: Cardiology

## 2016-10-26 ENCOUNTER — Ambulatory Visit (INDEPENDENT_AMBULATORY_CARE_PROVIDER_SITE_OTHER): Payer: Medicare Other | Admitting: Cardiology

## 2016-10-26 VITALS — BP 82/60 | HR 64 | Ht 63.25 in | Wt 133.0 lb

## 2016-10-26 DIAGNOSIS — I428 Other cardiomyopathies: Secondary | ICD-10-CM

## 2016-10-26 DIAGNOSIS — I420 Dilated cardiomyopathy: Secondary | ICD-10-CM

## 2016-10-26 DIAGNOSIS — I5022 Chronic systolic (congestive) heart failure: Secondary | ICD-10-CM

## 2016-10-26 DIAGNOSIS — I442 Atrioventricular block, complete: Secondary | ICD-10-CM

## 2016-10-26 DIAGNOSIS — I4819 Other persistent atrial fibrillation: Secondary | ICD-10-CM

## 2016-10-26 DIAGNOSIS — Z95810 Presence of automatic (implantable) cardiac defibrillator: Secondary | ICD-10-CM

## 2016-10-26 DIAGNOSIS — I481 Persistent atrial fibrillation: Secondary | ICD-10-CM

## 2016-10-26 MED ORDER — POTASSIUM CHLORIDE CRYS ER 20 MEQ PO TBCR: 20.0000 meq | EXTENDED_RELEASE_TABLET | Freq: Every day | ORAL | 3 refills | Status: DC

## 2016-10-26 MED ORDER — CARVEDILOL 3.125 MG PO TABS: 3.1250 mg | ORAL_TABLET | Freq: Two times a day (BID) | ORAL | 3 refills | Status: DC

## 2016-10-26 NOTE — Patient Instructions (Signed)
Medication Instructions:  Please decrease your Carvedilol to 3.125 mg twice a day. You may stop your Spironolactone. Please start Potassium Chloride 20 MEQ daily. Continue all other medications as listed.  Follow-Up: Follow up in 2 weeks.  If you need a refill on your cardiac medications before your next appointment, please call your pharmacy.  Thank you for choosing [facility]!!

## 2016-10-26 NOTE — Progress Notes (Signed)
[place]. [address], [place]  [address] Phone: [phone] Fax:  [phone]  Date:  10/26/2016   ID:  Kristi Parker, DOB 11/19/1950, MRN [id number]  PCP:  Irven Shelling, MD   History of Present Illness: Kristi Parker is a 65 y.o. female with episode of sustained ventricular tachycardia ,nonischemic cardiomyopathy ejection fraction previously 30-35% on 07/27/16, secondary pulmonary hypertension prior stroke over 10 years ago, paroxysmal atrial fibrillation on chronic anticoagulation, biventricular pacemaker (original version 2009 placed by Dr. Leonia Reeves), mild mitral regurgitation here for follow up.  She was in the hospital in 2017 for sustained symptomatic ventricular tachycardia that was pace terminated through her pacemaker. She had an upgrade on her pacemaker on that admission. No ICD shocks. She works out at [employer]. CRT-D implanted in 2017 for ventricular tachycardia as stated above.  He comes in today with hypotension, blood pressure 82/60. Her carvedilol is currently 6.25 mg twice a day and lisinopril is 2.5 and spironolactone is 12.5 once a day. She is also taking amiodarone 200 mg once a day. She also recently took 3 extra days of Lasix to help after she received a transmission showing increase impedance. She's not having any dizziness interestingly. Her weight is stable.  Chest pain woke up, MSK, when bent over. Goes away when standing up. She has noticed this in the past.  She has battled with hypotension in the past as well and was on carvedilol 12.5 mg twice a day.    Wt Readings from Last 3 Encounters:  10/26/16 133 lb (60.3 kg)  09/22/16 136 lb (61.7 kg)  08/10/16 134 lb 9.6 oz (61.1 kg)     Past Medical History:  Diagnosis Date  . Adenomatous colon polyp   . Cervical radiculopathy at C5   . Chronic systolic heart failure ([place])   . CKD (chronic kidney disease), stage III   . DDD (degenerative disc disease), cervical   . DDD  (degenerative disc disease), lumbar   . Fibroid   . GERD (gastroesophageal reflux disease)   . Hearing loss 02/2010   L hearing loss/vertigo, steroids  . History of seizure disorder    Related Hx  . Mitral regurgitation    moderate  . Multiple sclerosis ([place]) 1984  . Nonischemic cardiomyopathy ([facility])    Moderate LVEF 35-40% by ECHO 2011, 25-30% by echo 2013  . Permanent atrial fibrillation ([place])   . Pulmonary hypertension, secondary 06/04/2013   As a result of nonischemic cardiomyopathy EF 25-30%  . Stroke [facility])    Right brain CVA, complete recovery 07/2003  . Ventricular tachycardia ([place])   . Vertigo 02/2010   L hearing loss/vertigo, steroids    Past Surgical History:  Procedure Laterality Date  . BIV PACEMAKER GENERATOR CHANGE OUT N/A 09/18/2014   Procedure: BIV PACEMAKER GENERATOR CHANGE OUT;  Surgeon: Thompson Grayer, MD;  Location: [facility] CATH LAB;  Service: Cardiovascular;  Laterality: N/A;  . DILATION AND CURETTAGE OF UTERUS    . EP IMPLANTABLE DEVICE N/A 07/28/2016   Procedure: ICD Implant;  Surgeon: Evans Lance, MD;  Location: [facility] CV LAB;  Service: Cardiovascular;  Laterality: N/A;  . HYSTEROSCOPY    . PACEMAKER INSERTION     PTVP 08/2001 for complete heart block. Upgrade PTVP to MDT BiV 06/2008 by Dr Leonia Reeves  . TUBAL LIGATION      Current Outpatient Prescriptions  Medication Sig Dispense Refill  . acetaminophen (TYLENOL) 500 MG tablet Take 500-1,000 mg  by mouth every 6 (six) hours as needed (for back pain).    [name] amiodarone (PACERONE) 200 MG tablet Take 1 tablet (200 mg total) by mouth daily. 90 tablet 3  . BIOTIN PO Take 1 capsule by mouth daily.    . Calcium Carbonate-Vitamin D (CALCIUM + D PO) Take 1 tablet by mouth daily.     . carvedilol (COREG) 3.125 MG tablet Take 1 tablet (3.125 mg total) by mouth 2 (two) times daily. 60 tablet 3  . COUMADIN 5 MG tablet TAKE AS DIRECTED 30 tablet 2  . fexofenadine (ALLEGRA) 180 MG tablet Take 180 mg by mouth daily as needed  for allergies.     . furosemide (LASIX) 40 MG tablet Take 1 tablet (40 mg total) by mouth daily. May take extra 1/2 tablet as needed for swelling (Patient taking differently: Take 40 mg by mouth daily. May take extra 1/2 tablet (20 mg) as needed for swelling) 105 tablet 3  . lisinopril (PRINIVIL,ZESTRIL) 2.5 MG tablet Take 1 tablet (2.5 mg total) by mouth daily. 90 tablet 3  . loratadine (CLARITIN) 10 MG tablet Take 10 mg by mouth daily as needed for allergies.    [name] MAGNESIUM PO Take 1 tablet by mouth daily.    . potassium chloride SA (K-DUR,KLOR-CON) 20 MEQ tablet Take 1 tablet (20 mEq total) by mouth daily. 30 tablet 3   No current facility-administered medications for this visit.     Allergies:   No Known Allergies  Social History:  The patient  reports that she has never smoked. She has never used smokeless tobacco. She reports that she drinks alcohol. She reports that she does not use drugs.   ROS:  Please see the history of present illness.  No syncope, no bleeding, no orthopnea, no PND.   All other systems reviewed and negative.   PHYSICAL EXAM: VS:  BP (!) 82/60   Pulse 64   Ht 5' 3.25" (1.607 m)   Wt 133 lb (60.3 kg)   LMP  (LMP Unknown)   BMI 23.37 kg/m  Well nourished, well developed, in no acute distress  HEENT: normal  Neck: no JVD  Cardiac:  normal S1, S2; RRR; soft systolic murmur Left lower sternal Lungs:  clear to auscultation bilaterally, no wheezing, rhonchi or rales  Abd: soft, nontender, no hepatomegaly  Ext: no edema  Skin: warm and dry bruise back on shoulder Neuro: no focal abnormalities noted  EKG:  EKG performed today 10/26/16-AV sequential pacing heart rate 61 bpm. Only view-prior 07/21/16-ventricular pacing, currently in sinus rhythm verified by defibrillator interrogation AV pacing rate 69 bpm. Personally viewed-prior 05/11/16-V paced rhythm 67, no other abnormalities-prior Biventricular pacing rate 63, underlying atrial fibrillation, no change from prior.      ECHO 07/27/16:- - Left ventricle: The cavity size was moderately dilated. Systolic   function was moderately to severely reduced. The estimated   ejection fraction was in the range of 30% to 35%. Diffuse   hypokinesis. - Aortic valve: There was trivial regurgitation. - Mitral valve: There was moderate regurgitation. - Left atrium: The atrium was moderately dilated. - Right atrium: The atrium was moderately dilated. - Atrial septum: No defect or patent foramen ovale was identified. - Tricuspid valve: There was moderate regurgitation. - Pulmonary arteries: PA peak pressure: 45 mm Hg (S).  ASSESSMENT AND PLAN:  1. Nonischemic cardiomyopathy-EF 35%. Previously normal coronary arteries. Given her hypotension, we will once again cut back her carvedilol to 3.125 mg twice a day. Continue  with amiodarone 200 mg once a day. We will stop her spironolactone 12.5 mg once a day and added potassium 20 mEq once a day. In 2 weeks she will come back in and check a basic metabolic profile. I want to make sure that her potassium is normal given her prior history of ventricular tachycardia sustained.  2. Hypotension - as above. No dizziness interestingly. Cutting back medications.  3. Medtronic CRT defibrillator-Dr. Allred's. No discharges. Complete heart block treated.  4. Sustained ventricular tachycardia-amiodarone 200 mg. ICD is in place. Previously she was terminated. 5. Atrial fibrillation-currently anticoagulated. Coumadin monitored. Prior stroke over 10 years ago.  6. Pulmonary hypertension, secondary-as a result of cardiomyopathy. Overall mild. Well controlled. 7. Mild mitral regurgitation-murmur is not that impressive. Continue to monitor. 8. We will see her back in 2 weeks. If blood pressure remains low, we may need to discontinue her lisinopril 2.5.  Signed, Candee Furbish, MD [facility]  10/26/2016 10:19 AM

## 2016-10-31 ENCOUNTER — Ambulatory Visit (INDEPENDENT_AMBULATORY_CARE_PROVIDER_SITE_OTHER): Payer: Medicare Other | Admitting: Internal Medicine

## 2016-10-31 ENCOUNTER — Encounter: Payer: Self-pay | Admitting: Internal Medicine

## 2016-10-31 ENCOUNTER — Ambulatory Visit (INDEPENDENT_AMBULATORY_CARE_PROVIDER_SITE_OTHER): Payer: Medicare Other | Admitting: *Deleted

## 2016-10-31 VITALS — BP 100/62 | HR 82 | Ht 63.25 in | Wt 132.2 lb

## 2016-10-31 DIAGNOSIS — I5022 Chronic systolic (congestive) heart failure: Secondary | ICD-10-CM | POA: Diagnosis not present

## 2016-10-31 DIAGNOSIS — I428 Other cardiomyopathies: Secondary | ICD-10-CM | POA: Diagnosis not present

## 2016-10-31 DIAGNOSIS — Z79899 Other long term (current) drug therapy: Secondary | ICD-10-CM | POA: Diagnosis not present

## 2016-10-31 DIAGNOSIS — I481 Persistent atrial fibrillation: Secondary | ICD-10-CM | POA: Diagnosis not present

## 2016-10-31 DIAGNOSIS — I442 Atrioventricular block, complete: Secondary | ICD-10-CM | POA: Diagnosis not present

## 2016-10-31 DIAGNOSIS — Z5181 Encounter for therapeutic drug level monitoring: Secondary | ICD-10-CM

## 2016-10-31 DIAGNOSIS — I4819 Other persistent atrial fibrillation: Secondary | ICD-10-CM

## 2016-10-31 DIAGNOSIS — I472 Ventricular tachycardia, unspecified: Secondary | ICD-10-CM

## 2016-10-31 DIAGNOSIS — I4891 Unspecified atrial fibrillation: Secondary | ICD-10-CM

## 2016-10-31 DIAGNOSIS — Z95810 Presence of automatic (implantable) cardiac defibrillator: Secondary | ICD-10-CM | POA: Diagnosis not present

## 2016-10-31 LAB — CUP PACEART INCLINIC DEVICE CHECK
Battery Remaining Longevity: 89 mo
Battery Voltage: 3.01 V
Brady Statistic AP VP Percent: 99.61 %
Brady Statistic AP VS Percent: 0.09 %
Brady Statistic AS VP Percent: 0.29 %
Brady Statistic AS VS Percent: 0.02 %
Brady Statistic RA Percent Paced: 99.22 %
Brady Statistic RV Percent Paced: 99.34 %
Date Time Interrogation Session: 20180319104716
HighPow Impedance: 41 Ohm
Implantable Lead Implant Date: 20091104
Implantable Lead Implant Date: 20091104
Implantable Lead Implant Date: 20171214
Implantable Lead Location: 753858
Implantable Lead Location: 753859
Implantable Lead Location: 753860
Implantable Lead Model: 4196
Implantable Lead Model: 5076
Implantable Pulse Generator Implant Date: 20171214
Lead Channel Impedance Value: 1121 Ohm
Lead Channel Impedance Value: 418 Ohm
Lead Channel Impedance Value: 475 Ohm
Lead Channel Impedance Value: 551 Ohm
Lead Channel Impedance Value: 608 Ohm
Lead Channel Impedance Value: 608 Ohm
Lead Channel Pacing Threshold Amplitude: 0.75 V
Lead Channel Pacing Threshold Amplitude: 0.75 V
Lead Channel Pacing Threshold Amplitude: 1.25 V
Lead Channel Pacing Threshold Pulse Width: 0.4 ms
Lead Channel Pacing Threshold Pulse Width: 0.4 ms
Lead Channel Pacing Threshold Pulse Width: 0.6 ms
Lead Channel Setting Pacing Amplitude: 2 V
Lead Channel Setting Pacing Amplitude: 2.25 V
Lead Channel Setting Pacing Amplitude: 2.5 V
Lead Channel Setting Pacing Pulse Width: 0.4 ms
Lead Channel Setting Pacing Pulse Width: 0.6 ms
Lead Channel Setting Sensing Sensitivity: 0.3 mV

## 2016-10-31 LAB — POCT INR: INR: 3.1

## 2016-10-31 NOTE — Patient Instructions (Signed)
Medication Instructions: - Your physician recommends that you continue on your current medications as directed. Please refer to the Current Medication list given to you today.  Labwork: - Your physician recommends that you have lab work today: CMET/ [place]/ TSH/ BNP/ CBC  Procedures/Testing: - Your physician has requested that you have an echocardiogram- in 3 months (just prior to your follow up with [employer]). Echocardiography is a painless test that uses sound waves to create images of your heart. It provides your doctor with information about the size and shape of your heart and how well your heart's chambers and valves are working. This procedure takes approximately one hour. There are no restrictions for this procedure.  Follow-Up: - Your physician recommends that you schedule a follow-up appointment in: 3 months with Unk Pinto, NP for Dr. Rayann Heman.   Any Additional Special Instructions Will Be Listed Below (If Applicable).     If you need a refill on your cardiac medications before your next appointment, please call your pharmacy.

## 2016-10-31 NOTE — Progress Notes (Signed)
Electrophysiology Office Note Date: 10/31/2016  ID:  Kristi Parker, DOB 06-24-1951, MRN [id number]  PCP: Irven Shelling, MD Primary Cardiologist: Marlou Porch Electrophysiologist: Kessler Kopinski  CC: VT follow up  Kristi Parker is a 66 y.o. female seen today for EP follow up.  She is doing well.  She has had no further sustained VT.  CHF has improved with upgrade to CRT-D.  She is active.   She exercises at [employer].  She denies chest pain, palpitations, dyspnea, PND, orthopnea, nausea, vomiting, dizziness, syncope, edema, weight gain, or early satiety.  She has not had ICD shocks.   Device History: Single chamber pacemaker implanted 2003; upgrade to MDT CRTP 2009; gen change 2016; upgrade to CRTD 2017 for NICM, sustained VT History of appropriate therapy: No History of AAD therapy: Yes - amiodarone    Past Medical History:  Diagnosis Date  . Adenomatous colon polyp   . Cervical radiculopathy at C5   . Chronic systolic heart failure ([place])   . CKD (chronic kidney disease), stage III   . DDD (degenerative disc disease), cervical   . DDD (degenerative disc disease), lumbar   . Fibroid   . GERD (gastroesophageal reflux disease)   . Hearing loss 02/2010   L hearing loss/vertigo, steroids  . History of seizure disorder    Related Hx  . Mitral regurgitation    moderate  . Multiple sclerosis ([place]) 1984  . Nonischemic cardiomyopathy ([facility])    Moderate LVEF 35-40% by ECHO 2011, 25-30% by echo 2013  . Permanent atrial fibrillation ([place])   . Pulmonary hypertension, secondary 06/04/2013   As a result of nonischemic cardiomyopathy EF 25-30%  . Stroke [facility])    Right brain CVA, complete recovery 07/2003  . Ventricular tachycardia ([place])   . Vertigo 02/2010   L hearing loss/vertigo, steroids   Past Surgical History:  Procedure Laterality Date  . BIV PACEMAKER GENERATOR CHANGE OUT N/A 09/18/2014   Procedure: BIV PACEMAKER GENERATOR CHANGE OUT;  Surgeon: Thompson Grayer, MD;  Location[facility] CATH  LAB;  Service: Cardiovascular;  Laterality: N/A;  . DILATION AND CURETTAGE OF UTERUS    . EP IMPLANTABLE DEVICE N/A 07/28/2016   Procedure: ICD Implant;  Surgeon: Evans Lance, MD;  Location: [facility] CV LAB;  Service: Cardiovascular;  Laterality: N/A;  . HYSTEROSCOPY    . PACEMAKER INSERTION     PTVP 08/2001 for complete heart block. Upgrade PTVP to MDT BiV 06/2008 by Dr Leonia Reeves  . TUBAL LIGATION      Current Outpatient Prescriptions  Medication Sig Dispense Refill  . acetaminophen (TYLENOL) 500 MG tablet Take 500-1,000 mg by mouth every 6 (six) hours as needed (for back pain).    [name] amiodarone (PACERONE) 200 MG tablet Take 1 tablet (200 mg total) by mouth daily. 90 tablet 3  . BIOTIN PO Take 1 capsule by mouth daily.    . Calcium Carbonate-Vitamin D (CALCIUM + D PO) Take 1 tablet by mouth daily.     . carvedilol (COREG) 3.125 MG tablet Take 1 tablet (3.125 mg total) by mouth 2 (two) times daily. 60 tablet 3  . COUMADIN 5 MG tablet TAKE AS DIRECTED 30 tablet 2  . fexofenadine (ALLEGRA) 180 MG tablet Take 180 mg by mouth daily as needed for allergies.     . furosemide (LASIX) 40 MG tablet Take 1 tablet (40 mg total) by mouth daily. May take extra 1/2 tablet as needed for swelling (Patient taking differently: Take 40 mg by mouth daily.  May take extra 1/2 tablet (20 mg) as needed for swelling) 105 tablet 3  . lisinopril (PRINIVIL,ZESTRIL) 2.5 MG tablet Take 1 tablet (2.5 mg total) by mouth daily. 90 tablet 3  . loratadine (CLARITIN) 10 MG tablet Take 10 mg by mouth daily as needed for allergies.    [name] MAGNESIUM PO Take 1 tablet by mouth daily.    . potassium chloride SA (K-DUR,KLOR-CON) 20 MEQ tablet Take 1 tablet (20 mEq total) by mouth daily. 30 tablet 3   No current facility-administered medications for this visit.     Allergies:   Patient has no known allergies.   Social History: Social History   Social History  . Marital status: Married    Spouse name: N/A  . Number of  children: N/A  . Years of education: N/A   Occupational History  . Not on file.   Social History Main Topics  . Smoking status: Never Smoker  . Smokeless tobacco: Never Used  . Alcohol use Yes     Comment: rare  . Drug use: No  . Sexual activity: Yes    Birth control/ protection: Post-menopausal   Other Topics Concern  . Not on file   Social History Narrative  . No narrative on file    Family History: Family History  Problem Relation Age of Onset  . Diabetes Mother   . Multiple sclerosis Mother   . Cancer Father     ?  [name] CAD      Father's side CAD    Review of Systems: All other systems reviewed and are otherwise negative except as noted above.   Physical Exam: VS:  BP 100/62   Pulse 82   Ht 5' 3.25" (1.607 m)   Wt 132 lb 3.2 oz (60 kg)   LMP  (LMP Unknown)   SpO2 99%   BMI 23.23 kg/m  , BMI Body mass index is 23.23 kg/m.  GEN- The patient is well appearing, alert and oriented x 3 today.   HEENT: normocephalic, atraumatic; sclera clear, conjunctiva pink; hearing intact; oropharynx clear; neck supple  Lungs- Clear to ausculation bilaterally, normal work of breathing.  Heart- Regular rate and rhythm (paced) GI- soft, non-tender, non-distended, bowel sounds present  Extremities- no clubbing, cyanosis, or edema  MS- no significant deformity or atrophy Skin- warm and dry, no rash or lesion; ICD pocket well healed  Psych- euthymic mood, full affect Neuro- strength and sensation are intact  ICD interrogation- reviewed in detail today,  See PACEART report  EKG:  EKG is personally reviewed today. The ekg ordered today shows AV pacing   Recent Labs: 07/27/2016: ALT 25; B Natriuretic Peptide 281.5; Magnesium 2.1; TSH 4.991 07/28/2016: BUN 11; Creatinine, Ser 1.64; Hemoglobin 10.3; Platelets 134; Potassium 3.5; Sodium 140   Wt Readings from Last 3 Encounters:  10/31/16 132 lb 3.2 oz (60 kg)  10/26/16 133 lb (60.3 kg)  09/22/16 136 lb (61.7 kg)        Assessment and Plan:  1.  Ventricular tachycardia Doing well post CRTD upgrade No further sustained VT.  Consider stopping amiodarone upon return in 3 months LFTs, TFTs today  2. Chronic systolic dysfunction euvolemic today Stable on an appropriate medical regimen.  Recently spironolactone was stopped due to hypotension. Normal ICD function See Pace Art report No changes today bmet, BNP, mg, and K today Followed in [facility] device clinic Repeat echo in 3 months.  Follow-up with EP NP at that time for optimization if required.  3.  Persistent atrial fibrillation maintaining SR with amiodarone Continue Warfarin long term for CHADS2VASC of 5   Current medicines are reviewed at length with the patient today.   The patient does not have concerns regarding her medicines.  The following changes were made today:  none  Disposition:   Follow up with EP NP in 3 months, [facility]   Signed, Thompson Grayer, MD  10/31/2016 10:26 AM  [facility] [address] [place] [id number] [phone] (office) [phone] (fax)

## 2016-11-01 ENCOUNTER — Other Ambulatory Visit: Payer: Self-pay

## 2016-11-01 ENCOUNTER — Encounter: Payer: Self-pay | Admitting: Physician Assistant

## 2016-11-01 ENCOUNTER — Ambulatory Visit: Payer: Medicare Other | Admitting: Internal Medicine

## 2016-11-01 ENCOUNTER — Telehealth: Payer: Self-pay

## 2016-11-01 DIAGNOSIS — Z79899 Other long term (current) drug therapy: Secondary | ICD-10-CM

## 2016-11-01 LAB — COMPREHENSIVE METABOLIC PANEL
ALT: 15 IU/L (ref 0–32)
AST: 17 IU/L (ref 0–40)
Albumin/Globulin Ratio: 1.7 (ref 1.2–2.2)
Albumin: 4.7 g/dL (ref 3.6–4.8)
Alkaline Phosphatase: 97 IU/L (ref 39–117)
BUN/Creatinine Ratio: 13 (ref 12–28)
BUN: 33 mg/dL — ABNORMAL HIGH (ref 8–27)
Bilirubin Total: 1.4 mg/dL — ABNORMAL HIGH (ref 0.0–1.2)
CO2: 23 mmol/L (ref 18–29)
Calcium: 9.9 mg/dL (ref 8.7–10.3)
Chloride: 95 mmol/L — ABNORMAL LOW (ref 96–106)
Creatinine, Ser: 2.49 mg/dL — ABNORMAL HIGH (ref 0.57–1.00)
GFR calc Af Amer: 23 mL/min/{1.73_m2} — ABNORMAL LOW (ref >59)
GFR calc non Af Amer: 20 mL/min/{1.73_m2} — ABNORMAL LOW (ref >59)
Globulin, Total: 2.8 g/dL (ref 1.5–4.5)
Glucose: 88 mg/dL (ref 65–99)
Potassium: 4.7 mmol/L (ref 3.5–5.2)
Sodium: 134 mmol/L (ref 134–144)
Total Protein: 7.5 g/dL (ref 6.0–8.5)

## 2016-11-01 LAB — CBC WITH DIFFERENTIAL
Basophils Absolute: 0 10*3/uL (ref 0.0–0.2)
Basos: 0 %
EOS (ABSOLUTE): 0.1 10*3/uL (ref 0.0–0.4)
Eos: 1 %
Hematocrit: 34.5 % (ref 34.0–46.6)
Hemoglobin: 11.5 g/dL (ref 11.1–15.9)
Immature Grans (Abs): 0 10*3/uL (ref 0.0–0.1)
Immature Granulocytes: 0 %
Lymphocytes Absolute: 1.4 10*3/uL (ref 0.7–3.1)
Lymphs: 30 %
MCH: 30.9 pg (ref 26.6–33.0)
MCHC: 33.3 g/dL (ref 31.5–35.7)
MCV: 93 fL (ref 79–97)
Monocytes Absolute: 0.4 10*3/uL (ref 0.1–0.9)
Monocytes: 8 %
Neutrophils Absolute: 2.9 10*3/uL (ref 1.4–7.0)
Neutrophils: 61 %
RBC: 3.72 x10E6/uL — ABNORMAL LOW (ref 3.77–5.28)
RDW: 14.9 % (ref 12.3–15.4)
WBC: 4.8 10*3/uL (ref 3.4–10.8)

## 2016-11-01 LAB — MAGNESIUM: Magnesium: 2.1 mg/dL (ref 1.6–2.3)

## 2016-11-01 LAB — TSH: TSH: 9.45 u[IU]/mL — ABNORMAL HIGH (ref 0.450–4.500)

## 2016-11-01 LAB — PRO B NATRIURETIC PEPTIDE: NT-Pro BNP: 579 pg/mL — ABNORMAL HIGH (ref 0–301)

## 2016-11-01 NOTE — Telephone Encounter (Signed)
Pt is aware of results. She is agreeable to Hold for furosemide for 3 days. (21,22,23) reduce to 20mg  QD (o.5 tablet) on Saturday 3/24 and for a CMET recheck on 3/30. Also informed patient that TSH levels doubled over last 3 months and that her PCP was notified and she should contact them for therapy for that. She was agreeable to plan.

## 2016-11-02 ENCOUNTER — Encounter: Payer: Self-pay | Admitting: Cardiology

## 2016-11-04 NOTE — Progress Notes (Signed)
Cardiology Office Note    Date:  11/10/2016   ID:  Kristi Parker, DOB Apr 21, 1951, MRN [id number]  PCP:  Irven Shelling, MD  Cardiologist: Dr. Marlou Porch Dr. Rayann Heman  CC: follow up   History of Present Illness:  Kristi Parker is a 66 y.o. female with a history of sustained VT on amiodarone, NICM s/p CRT-D, CHB s/p PPM (upgraded later to CRT-D), secondary pulmonary HTN, previous CVA, PAF on coumadin, CKD, and mild MR who presents to clinic for follow up.   She has a long history of NICM. Per Dr Marlou Porch, she had a cath with no CAD (I cannot find cath report). She had a single chamber pacemaker implanted 2003 for CHB and upgrade to MDT CRT-P in 2009 with gen change 2016. She was admitted in 2017 for sustained, symptomatic VT that was pace terminated with her pacemaker. She was upgraded to CRT-D. She has remained on amiodarone since that time, but per Dr. [name] last note, this may de discontinued in the next few months.   She was seen by Dr. Marlou Porch in the office on 10/26/16 and found to be hypotensive with BP 82/60. Her lasix had been increased prior to that due to increased impedance found on her device. Her spirolactone 12.5mg  was discontinued and Kdur 83mEq was added and coreg decreased to 3.25mg  BID.   She saw Dr. Rayann Heman after this and several labs were checked including BNP on 579, creat up to 2.49 and TSH 9.5. Lasix 40mg  daily held for 3 days with AKI.   Today she presents to clinic for follow up. She has been feeling fine. No CP or SOB. No LE edema, orthopnea or PND. No dizziness or syncope. No blood in stool or urine. No palpitations.  She weighs herself daily and weight fluctuates 129-130 lbs. Weight has been stable.    Past Medical History:  Diagnosis Date  . Adenomatous colon polyp   . Cervical radiculopathy at C5   . Chronic systolic heart failure ([place])   . CKD (chronic kidney disease), stage III   . DDD (degenerative disc disease), cervical   . DDD (degenerative  disc disease), lumbar   . Fibroid   . GERD (gastroesophageal reflux disease)   . Hearing loss 02/2010   L hearing loss/vertigo, steroids  . History of seizure disorder    Related Hx  . Mitral regurgitation    moderate  . Multiple sclerosis ([place]) 1984  . Nonischemic cardiomyopathy ([facility])    Moderate LVEF 35-40% by ECHO 2011, 25-30% by echo 2013  . Permanent atrial fibrillation ([place])   . Pulmonary hypertension, secondary 06/04/2013   As a result of nonischemic cardiomyopathy EF 25-30%  . Stroke [facility])    Right brain CVA, complete recovery 07/2003  . Ventricular tachycardia ([place])   . Vertigo 02/2010   L hearing loss/vertigo, steroids    Past Surgical History:  Procedure Laterality Date  . BIV PACEMAKER GENERATOR CHANGE OUT N/A 09/18/2014   Procedure: BIV PACEMAKER GENERATOR CHANGE OUT;  Surgeon: Danel Requena Grayer, MD;  Location: [facility] CATH LAB;  Service: Cardiovascular;  Laterality: N/A;  . DILATION AND CURETTAGE OF UTERUS    . EP IMPLANTABLE DEVICE N/A 07/28/2016   Procedure: ICD Implant;  Surgeon: Evans Lance, MD;  Location: [place] CV LAB;  Service: Cardiovascular;  Laterality: N/A;  . HYSTEROSCOPY    . PACEMAKER INSERTION     PTVP 08/2001 for complete heart block. Upgrade PTVP to MDT BiV 06/2008 by Dr Leonia Reeves  .  TUBAL LIGATION      Current Medications: Outpatient Medications Prior to Visit  Medication Sig Dispense Refill  . acetaminophen (TYLENOL) 500 MG tablet Take 500-1,000 mg by mouth every 6 (six) hours as needed (for back pain).    [name] amiodarone (PACERONE) 200 MG tablet Take 1 tablet (200 mg total) by mouth daily. 90 tablet 3  . BIOTIN PO Take 1 capsule by mouth daily.    . Calcium Carbonate-Vitamin D (CALCIUM + D PO) Take 1 tablet by mouth daily.     . carvedilol (COREG) 3.125 MG tablet Take 1 tablet (3.125 mg total) by mouth 2 (two) times daily. 60 tablet 3  . COUMADIN 5 MG tablet TAKE AS DIRECTED 30 tablet 2  . fexofenadine (ALLEGRA) 180 MG tablet Take 180 mg by mouth  daily as needed for allergies.     . furosemide (LASIX) 40 MG tablet Take 1 tablet (40 mg total) by mouth daily. May take extra 1/2 tablet as needed for swelling 105 tablet 3  . lisinopril (PRINIVIL,ZESTRIL) 2.5 MG tablet Take 1 tablet (2.5 mg total) by mouth daily. 90 tablet 3  . loratadine (CLARITIN) 10 MG tablet Take 10 mg by mouth daily as needed for allergies.    [name] MAGNESIUM PO Take 1 tablet by mouth daily.    . potassium chloride SA (K-DUR,KLOR-CON) 20 MEQ tablet Take 1 tablet (20 mEq total) by mouth daily. 30 tablet 3   No facility-administered medications prior to visit.      Allergies:   Patient has no known allergies.   Social History   Social History  . Marital status: Married    Spouse name: N/A  . Number of children: N/A  . Years of education: N/A   Social History Main Topics  . Smoking status: Never Smoker  . Smokeless tobacco: Never Used  . Alcohol use Yes     Comment: rare  . Drug use: No  . Sexual activity: Yes    Birth control/ protection: Post-menopausal   Other Topics Concern  . None   Social History Narrative  . None     Family History:  The patient's family history includes Cancer in her father; Diabetes in her mother; Multiple sclerosis in her mother.      ROS:   Please see the history of present illness.    ROS All other systems reviewed and are negative.   PHYSICAL EXAM:   VS:  BP 120/70   Pulse 72   Ht 5\' 3"  (1.6 m)   Wt 135 lb 8 oz (61.5 kg)   LMP  (LMP Unknown)   SpO2 96%   BMI 24.00 kg/m    GEN: Well nourished, well developed, in no acute distress  HEENT: normal  Neck: no JVD, carotid bruits, or masses Cardiac: RRR; no murmurs, rubs, or gallops,no edema  Respiratory:  clear to auscultation bilaterally, normal work of breathing GI: soft, nontender, nondistended, + BS MS: no deformity or atrophy  Skin: warm and dry, no rash Neuro:  Alert and Oriented x 3, Strength and sensation are intact Psych: euthymic mood, full  affect   Wt Readings from Last 3 Encounters:  11/10/16 135 lb 8 oz (61.5 kg)  10/31/16 132 lb 3.2 oz (60 kg)  10/26/16 133 lb (60.3 kg)      Studies/Labs Reviewed:   EKG:  EKG is NOT ordered today.  Recent Labs: 07/27/2016: B Natriuretic Peptide 281.5 07/28/2016: Hemoglobin 10.3; Platelets 134 10/31/2016: ALT 15; BUN 33; Creatinine, Ser 2.49;  Magnesium 2.1; NT-Pro BNP 579; Potassium 4.7; Sodium 134; TSH 9.450   Lipid Panel    Component Value Date/Time   CHOL 175 07/18/2013 1100   TRIG 122.0 07/18/2013 1100   HDL 46.40 07/18/2013 1100   CHOLHDL 4 07/18/2013 1100   VLDL 24.4 07/18/2013 1100   LDLCALC 104 (H) 07/18/2013 1100    Additional studies/ records that were reviewed today include:  ECHO 07/27/16: - Left ventricle: The cavity size was moderately dilated. Systolic function was moderately to severely reduced. The estimated ejection fraction was in the range of 30% to 35%. Diffuse hypokinesis. - Aortic valve: There was trivial regurgitation. - Mitral valve: There was moderate regurgitation. - Left atrium: The atrium was moderately dilated. - Right atrium: The atrium was moderately dilated. - Atrial septum: No defect or patent foramen ovale was identified. - Tricuspid valve: There was moderate regurgitation. - Pulmonary arteries: PA peak pressure: 45 mm Hg (S).   ASSESSMENT & PLAN:   Hypotension: BP improved today off spiro and decreased coreg.  AKI:  Creat up to 2.49 and lasix held x 3 days. Will check BMET today   NICM: EF 30-35% s/p CRT-D. Previously normal cors. Continue Coreg and Lisinopril as well as lasix 40mg  daily. She appears euvolemic today and weights have been stable. Will check a BNP today  Hx of sustained VT: continue amiodarone. ICD in place  PAF: sounds regular on exam today. Maintaining NSR on amiodarone. CHADSVASC of at least 6 (CHF, HTN, age, CVA, F sex). Continue coumadin   Pulm HTN: stable.   Mild MR: stable.   Hypothyroid:  getting thyroid function studies with Dr. Coral Spikes tomorrow. May be related to long term amio use.  Medication Adjustments/Labs and Tests Ordered: Current medicines are reviewed at length with the patient today.  Concerns regarding medicines are outlined above.  Medication changes, Labs and Tests ordered today are listed in the Patient Instructions below. Patient Instructions  Medication Instructions:  Your physician recommends that you continue on your current medications as directed. Please refer to the Current Medication list given to you today.   Labwork: TODAY:  CMET & PRO BNP  Testing/Procedures: None ordered  Follow-Up: Your physician recommends that you schedule a follow-up appointment in: 3 MONTHS WITH DR. Marlou Porch   Any Other Special Instructions Will Be Listed Below (If Applicable).     If you need a refill on your cardiac medications before your next appointment, please call your pharmacy.      Signed, Kristi Form, PA-C  11/10/2016 9:22 AM    [facility] [place], [place], [place]  [address] Phone: [phone]; Fax: [phone]

## 2016-11-10 ENCOUNTER — Ambulatory Visit (INDEPENDENT_AMBULATORY_CARE_PROVIDER_SITE_OTHER): Payer: Medicare Other | Admitting: Physician Assistant

## 2016-11-10 ENCOUNTER — Encounter: Payer: Self-pay | Admitting: Physician Assistant

## 2016-11-10 VITALS — BP 120/70 | HR 72 | Ht 63.0 in | Wt 135.5 lb

## 2016-11-10 DIAGNOSIS — Z95810 Presence of automatic (implantable) cardiac defibrillator: Secondary | ICD-10-CM

## 2016-11-10 DIAGNOSIS — I472 Ventricular tachycardia, unspecified: Secondary | ICD-10-CM

## 2016-11-10 DIAGNOSIS — Z79899 Other long term (current) drug therapy: Secondary | ICD-10-CM | POA: Diagnosis not present

## 2016-11-10 DIAGNOSIS — I5022 Chronic systolic (congestive) heart failure: Secondary | ICD-10-CM

## 2016-11-10 DIAGNOSIS — I9589 Other hypotension: Secondary | ICD-10-CM

## 2016-11-10 DIAGNOSIS — N179 Acute kidney failure, unspecified: Secondary | ICD-10-CM

## 2016-11-10 DIAGNOSIS — I480 Paroxysmal atrial fibrillation: Secondary | ICD-10-CM

## 2016-11-10 DIAGNOSIS — I428 Other cardiomyopathies: Secondary | ICD-10-CM

## 2016-11-10 DIAGNOSIS — I442 Atrioventricular block, complete: Secondary | ICD-10-CM

## 2016-11-10 NOTE — Patient Instructions (Addendum)
Medication Instructions:  Your physician recommends that you continue on your current medications as directed. Please refer to the Current Medication list given to you today.   Labwork: TODAY:  CMET & PRO BNP  Testing/Procedures: None ordered  Follow-Up: Your physician recommends that you schedule a follow-up appointment in: 3 MONTHS WITH DR. Marlou Porch   Any Other Special Instructions Will Be Listed Below (If Applicable).     If you need a refill on your cardiac medications before your next appointment, please call your pharmacy.

## 2016-11-11 LAB — COMPREHENSIVE METABOLIC PANEL
ALT: 14 IU/L (ref 0–32)
AST: 16 IU/L (ref 0–40)
Albumin/Globulin Ratio: 1.5 (ref 1.2–2.2)
Albumin: 4.2 g/dL (ref 3.6–4.8)
Alkaline Phosphatase: 99 IU/L (ref 39–117)
BUN/Creatinine Ratio: 10 — ABNORMAL LOW (ref 12–28)
BUN: 17 mg/dL (ref 8–27)
Bilirubin Total: 1.3 mg/dL — ABNORMAL HIGH (ref 0.0–1.2)
CO2: 22 mmol/L (ref 18–29)
Calcium: 9.6 mg/dL (ref 8.7–10.3)
Chloride: 102 mmol/L (ref 96–106)
Creatinine, Ser: 1.75 mg/dL — ABNORMAL HIGH (ref 0.57–1.00)
GFR calc Af Amer: 35 mL/min/{1.73_m2} — ABNORMAL LOW (ref >59)
GFR calc non Af Amer: 30 mL/min/{1.73_m2} — ABNORMAL LOW (ref >59)
Globulin, Total: 2.8 g/dL (ref 1.5–4.5)
Glucose: 72 mg/dL (ref 65–99)
Potassium: 4.4 mmol/L (ref 3.5–5.2)
Sodium: 141 mmol/L (ref 134–144)
Total Protein: 7 g/dL (ref 6.0–8.5)

## 2016-11-11 LAB — PRO B NATRIURETIC PEPTIDE: NT-Pro BNP: 688 pg/mL — ABNORMAL HIGH (ref 0–301)

## 2016-11-16 ENCOUNTER — Ambulatory Visit (INDEPENDENT_AMBULATORY_CARE_PROVIDER_SITE_OTHER): Payer: Medicare Other | Admitting: *Deleted

## 2016-11-16 DIAGNOSIS — Z5181 Encounter for therapeutic drug level monitoring: Secondary | ICD-10-CM

## 2016-11-16 DIAGNOSIS — I4891 Unspecified atrial fibrillation: Secondary | ICD-10-CM

## 2016-11-16 LAB — POCT INR: INR: 2.4

## 2016-11-21 ENCOUNTER — Other Ambulatory Visit: Payer: Self-pay | Admitting: Cardiology

## 2016-12-01 ENCOUNTER — Ambulatory Visit (INDEPENDENT_AMBULATORY_CARE_PROVIDER_SITE_OTHER): Payer: Medicare Other

## 2016-12-01 DIAGNOSIS — I5022 Chronic systolic (congestive) heart failure: Secondary | ICD-10-CM | POA: Diagnosis not present

## 2016-12-01 DIAGNOSIS — I442 Atrioventricular block, complete: Secondary | ICD-10-CM | POA: Diagnosis not present

## 2016-12-01 DIAGNOSIS — Z95810 Presence of automatic (implantable) cardiac defibrillator: Secondary | ICD-10-CM

## 2016-12-01 NOTE — Progress Notes (Signed)
EPIC Encounter for ICM Monitoring  Patient Name: Kristi Parker is a 66 y.o. female Date: 12/01/2016 Primary Care Physican: Irven Shelling, MD Primary Cardiologist:Skains Electrophysiologist: Allred Dry Weight:131.2 lbs Bi-V Pacing: 99.4%      Heart Failure questions reviewed, pt has had weight increase of 3 lbs since 10/31/2016 which correlates with decreasing the Furosemide. She was 128 lbs on 10/31/2016.  She stated she feels fine and denies any other fluid symptoms.    Thoracic impedance abnormal suggesting fluid accumulation.  Prescribed and confirmed dosage: Furosemide 40 mg reduce to 1/2 tablet (20 mg total) daily on 11/01/2016 by Dr Rayann Heman (see lab note) in response to elevated Creatinine. Potassium 20 mEq 1 tablet daily.  Labs: 11/10/2016 Creatinine 1.75, BUN 17, Potassium 4.4, Sodium 141, EGFR 30-35 10/31/2016 Creatinine 2.49, BUN 33, Potassium 4.7, Sodium 134, EGFR 20-23 07/28/2016 Creatinine 1.64, BUN 11, Potassium 3.5, Sodium 140, EGFR 32-37 07/27/2017 Creatinine 1.88, BUN 18, Potassium 3.9, Sodium 140, EGFR 27-31  Recommendations:   Encouraged to call for fluid symptoms. Copy of ICM check sent to Dr Marlou Porch and Dr Rayann Heman for review and if any recommendations will call her back.   Patient will be out of town the week of 12/19/2016.  Follow-up plan: ICM clinic phone appointment on 12/26/2016 to recheck fluid levels.  Defib office appointment scheduled on 02/10/2017 with Chanetta Marshall, NP and Dr Marlou Porch same day.   3 month ICM trend: 12/01/2016   1 Year ICM trend:      [name], RN 12/01/2016 8:20 AM

## 2016-12-02 ENCOUNTER — Ambulatory Visit (INDEPENDENT_AMBULATORY_CARE_PROVIDER_SITE_OTHER): Payer: Medicare Other | Admitting: *Deleted

## 2016-12-02 DIAGNOSIS — Z5181 Encounter for therapeutic drug level monitoring: Secondary | ICD-10-CM | POA: Diagnosis not present

## 2016-12-02 DIAGNOSIS — I4891 Unspecified atrial fibrillation: Secondary | ICD-10-CM

## 2016-12-02 LAB — POCT INR: INR: 3.2

## 2016-12-14 ENCOUNTER — Ambulatory Visit (INDEPENDENT_AMBULATORY_CARE_PROVIDER_SITE_OTHER): Payer: Medicare Other | Admitting: *Deleted

## 2016-12-14 DIAGNOSIS — Z5181 Encounter for therapeutic drug level monitoring: Secondary | ICD-10-CM | POA: Diagnosis not present

## 2016-12-14 DIAGNOSIS — I4891 Unspecified atrial fibrillation: Secondary | ICD-10-CM

## 2016-12-14 LAB — POCT INR: INR: 1.7

## 2016-12-15 ENCOUNTER — Encounter: Payer: Self-pay | Admitting: Cardiology

## 2016-12-26 ENCOUNTER — Ambulatory Visit (INDEPENDENT_AMBULATORY_CARE_PROVIDER_SITE_OTHER): Payer: Self-pay

## 2016-12-26 DIAGNOSIS — I5022 Chronic systolic (congestive) heart failure: Secondary | ICD-10-CM

## 2016-12-26 DIAGNOSIS — Z95810 Presence of automatic (implantable) cardiac defibrillator: Secondary | ICD-10-CM

## 2016-12-26 NOTE — Progress Notes (Signed)
EPIC Encounter for ICM Monitoring  Patient Name: Kristi Parker is a 66 y.o. female Date: 12/26/2016 Primary Care Physican: [name], MD Primary Cardiologist:Skains Electrophysiologist: Allred Dry Weight:130 lbs Bi-V Pacing: 99.1%                                             Heart Failure questions reviewed, pt asymptomatic    Thoracic impedance trending just below baseline,normal.  Was out of town for about a week.   Prescribed and confirmed dosage: Furosemide 40 mg reduced to 1/2 tablet (20 mg total) daily on 11/01/2016 by Dr Rayann Heman (see lab note) in response to elevated Creatinine. Potassium 20 mEq 1 tablet daily.  Labs: 11/10/2016 Creatinine 1.75, BUN 17, Potassium 4.4, Sodium 141, EGFR 30-35 10/31/2016 Creatinine 2.49, BUN 33, Potassium 4.7, Sodium 134, EGFR 20-23 07/28/2016 Creatinine 1.64, BUN 11, Potassium 3.5, Sodium 140, EGFR 32-37 07/27/2017 Creatinine 1.88, BUN 18, Potassium 3.9, Sodium 140, EGFR 27-31  Recommendations: No changes. Discussed to limit salt intake to 2000 mg/day and fluid intake to < 2 liters/day.  Encouraged to call for fluid symptoms or use local ER for any urgent symptoms.  Follow-up plan: ICM clinic phone appointment on 01/23/2017.  Office appointment scheduled on 02/10/2017 with Dr Marlou Porch and Chanetta Marshall, NP.  Copy of ICM check sent to device physician.   3 month ICM trend: 12/26/2016   1 Year ICM trend:      [name], RN 12/26/2016 12:02 PM

## 2016-12-29 ENCOUNTER — Ambulatory Visit (INDEPENDENT_AMBULATORY_CARE_PROVIDER_SITE_OTHER): Payer: Medicare Other | Admitting: *Deleted

## 2016-12-29 DIAGNOSIS — Z5181 Encounter for therapeutic drug level monitoring: Secondary | ICD-10-CM

## 2016-12-29 DIAGNOSIS — I4891 Unspecified atrial fibrillation: Secondary | ICD-10-CM

## 2016-12-29 LAB — POCT INR: INR: 3.6

## 2017-01-10 ENCOUNTER — Other Ambulatory Visit: Payer: Self-pay | Admitting: Cardiology

## 2017-01-12 ENCOUNTER — Ambulatory Visit (INDEPENDENT_AMBULATORY_CARE_PROVIDER_SITE_OTHER): Payer: Medicare Other

## 2017-01-12 DIAGNOSIS — Z5181 Encounter for therapeutic drug level monitoring: Secondary | ICD-10-CM | POA: Diagnosis not present

## 2017-01-12 DIAGNOSIS — I4891 Unspecified atrial fibrillation: Secondary | ICD-10-CM | POA: Diagnosis not present

## 2017-01-12 LAB — POCT INR: INR: 4.2

## 2017-01-18 ENCOUNTER — Telehealth: Payer: Self-pay | Admitting: Cardiology

## 2017-01-18 NOTE — Telephone Encounter (Signed)
Advised pt she should check with her pharmacy to see if there is a different manufacturer of the medication she that might be able to swallow easier however I did tell most all (per other pts) are difficult to swallow.  She is going to check with pharmacy.

## 2017-01-18 NOTE — Telephone Encounter (Signed)
New Message     potassium chloride SA (K-DUR,KLOR-CON) 20 MEQ tablet Take 1 tablet (20 mEq total) by mouth daily.   Do you know of any brands that are smaller, these pills are too large for her to swallow

## 2017-01-19 ENCOUNTER — Encounter: Payer: Self-pay | Admitting: Cardiology

## 2017-01-20 ENCOUNTER — Telehealth: Payer: Self-pay | Admitting: *Deleted

## 2017-01-20 NOTE — Telephone Encounter (Signed)
PT CALLED ASKING FOR HER K+ TO BE CHANGED TO 10 MEQ INSTEAD OF 20 MEQ AS THE 20 IS HARD TO SWALLOW, SO SHE WOULD NEED K+ 10 MEQ 2 TABS PO QD, PLEASE CALL & CONFIRM WITH HER IF THIS IS POSSIBLE, THANKS

## 2017-01-23 ENCOUNTER — Ambulatory Visit (INDEPENDENT_AMBULATORY_CARE_PROVIDER_SITE_OTHER): Payer: Medicare Other

## 2017-01-23 ENCOUNTER — Telehealth: Payer: Self-pay

## 2017-01-23 DIAGNOSIS — Z95810 Presence of automatic (implantable) cardiac defibrillator: Secondary | ICD-10-CM

## 2017-01-23 DIAGNOSIS — I5022 Chronic systolic (congestive) heart failure: Secondary | ICD-10-CM | POA: Diagnosis not present

## 2017-01-23 MED ORDER — POTASSIUM CHLORIDE ER 10 MEQ PO TBCR: 20.0000 meq | EXTENDED_RELEASE_TABLET | Freq: Every day | ORAL | 2 refills | Status: DC

## 2017-01-23 NOTE — Telephone Encounter (Signed)
Remote ICM transmission received.  Attempted patient call and person answering phone stated she was not home.

## 2017-01-23 NOTE — Telephone Encounter (Signed)
will send in 10 meq K+ as advised Christen Bame as they are coated and the 20 meq K+ is not, pt aware 7 agreeable to plan.

## 2017-01-23 NOTE — Telephone Encounter (Signed)
The 10 meq tablets are coated and are usually easier to swallow. May refill for patient to take 2 10 meq tablets instead of 1 20 meq tablet.

## 2017-01-23 NOTE — Progress Notes (Signed)
EPIC Encounter for ICM Monitoring  Patient Name: Kristi Parker is a 66 y.o. female Date: 01/23/2017 Primary Care Physican: [name], MD Primary Cardiologist:[place] Electrophysiologist: Allred Dry Weight:Last known weight130lbs Bi-V Pacing: 99.3%      Attempted call to patient and unable to reach.    Transmission reviewed.    Thoracic impedance normal   Prescribed dosage: Furosemide 40 mg 0.5 tablet (20 mg total) daily. Potassium 20 mEq 1 tablet daily.  Labs: 11/10/2016 Creatinine 1.75, BUN 17, Potassium 4.4, Sodium 141, EGFR 30-35 10/31/2016 Creatinine 2.49, BUN 33, Potassium 4.7, Sodium 134, EGFR 20-23 07/28/2016 Creatinine 1.64, BUN 11, Potassium 3.5, Sodium 140, EGFR 32-37       07/27/2017 Creatinine 1.88, BUN 18, Potassium 3.9, Sodium 140, EGFR 27-31  Recommendations: NONE - Unable to reach patient   Follow-up plan: ICM clinic phone appointment on 03/13/2017.  Office appointment scheduled on 02/10/2017 with Dr. Marlou Porch and Chanetta Marshall, NP.  Copy of ICM check sent to device physician.   3 month ICM trend: 01/23/2017   1 Year ICM trend:      [name], RN 01/23/2017 1:31 PM

## 2017-01-24 ENCOUNTER — Ambulatory Visit (INDEPENDENT_AMBULATORY_CARE_PROVIDER_SITE_OTHER): Payer: Medicare Other | Admitting: *Deleted

## 2017-01-24 DIAGNOSIS — Z5181 Encounter for therapeutic drug level monitoring: Secondary | ICD-10-CM | POA: Diagnosis not present

## 2017-01-24 DIAGNOSIS — I4891 Unspecified atrial fibrillation: Secondary | ICD-10-CM

## 2017-01-24 LAB — POCT INR: INR: 2.3

## 2017-01-26 ENCOUNTER — Encounter: Payer: Self-pay | Admitting: Cardiology

## 2017-01-31 ENCOUNTER — Ambulatory Visit (HOSPITAL_COMMUNITY): Payer: Medicare Other | Attending: Cardiovascular Disease

## 2017-01-31 ENCOUNTER — Other Ambulatory Visit: Payer: Self-pay

## 2017-01-31 DIAGNOSIS — I371 Nonrheumatic pulmonary valve insufficiency: Secondary | ICD-10-CM | POA: Diagnosis not present

## 2017-01-31 DIAGNOSIS — I481 Persistent atrial fibrillation: Secondary | ICD-10-CM

## 2017-01-31 DIAGNOSIS — I428 Other cardiomyopathies: Secondary | ICD-10-CM

## 2017-01-31 DIAGNOSIS — N189 Chronic kidney disease, unspecified: Secondary | ICD-10-CM | POA: Diagnosis not present

## 2017-01-31 DIAGNOSIS — I509 Heart failure, unspecified: Secondary | ICD-10-CM | POA: Diagnosis not present

## 2017-01-31 DIAGNOSIS — I130 Hypertensive heart and chronic kidney disease with heart failure and stage 1 through stage 4 chronic kidney disease, or unspecified chronic kidney disease: Secondary | ICD-10-CM | POA: Insufficient documentation

## 2017-01-31 DIAGNOSIS — I4819 Other persistent atrial fibrillation: Secondary | ICD-10-CM

## 2017-01-31 DIAGNOSIS — I081 Rheumatic disorders of both mitral and tricuspid valves: Secondary | ICD-10-CM | POA: Insufficient documentation

## 2017-01-31 DIAGNOSIS — Z8249 Family history of ischemic heart disease and other diseases of the circulatory system: Secondary | ICD-10-CM | POA: Insufficient documentation

## 2017-02-05 NOTE — Progress Notes (Signed)
Electrophysiology Office Note Date: 02/10/2017  ID:  Stevi, [place] 1950-09-22, MRN [id number]  PCP: [name], MD Primary Cardiologist: Marlou Porch Electrophysiologist: Allred  CC: VT follow-up  Kristi Parker is a 66 y.o. female seen today for Dr Rayann Heman.  She presents today for routine electrophysiology followup.   Since last being seen in clinic, the patient reports doing very well. She denies chest pain, palpitations, dyspnea, PND, orthopnea, nausea, vomiting, dizziness, syncope, edema, weight gain, or early satiety.  She has not had ICD shocks. She continues to be active at [employer]. Recent echo showed persistently decreased LVEF. She has returned to driving 6 months post sustained VT. She feels that her exercise tolerance is decreased as she has been unable to drive and walk as much as she was in the past.   Device History: MDT single chamber PPM implanted 2003; upgrade to [place] 2009; gen change 2016; CRTD implanted 2017 for VT History of appropriate therapy: No History of AAD therapy: Yes   Past Medical History:  Diagnosis Date  . Adenomatous colon polyp   . Cervical radiculopathy at C5   . Chronic systolic heart failure ([place])   . CKD (chronic kidney disease), stage III   . DDD (degenerative disc disease), cervical   . DDD (degenerative disc disease), lumbar   . Fibroid   . GERD (gastroesophageal reflux disease)   . Hearing loss 02/2010   L hearing loss/vertigo, steroids  . History of seizure disorder    Related Hx  . Mitral regurgitation    moderate  . Multiple sclerosis ([place]) 1984  . Nonischemic cardiomyopathy ([facility])    Moderate LVEF 35-40% by ECHO 2011, 25-30% by echo 2013  . Permanent atrial fibrillation ([place])   . Pulmonary hypertension, secondary 06/04/2013   As a result of nonischemic cardiomyopathy EF 25-30%  . Stroke [facility])    Right brain CVA, complete recovery 07/2003  . Ventricular tachycardia ([place])   . Vertigo 02/2010   L hearing loss/vertigo,  steroids   Past Surgical History:  Procedure Laterality Date  . BIV PACEMAKER GENERATOR CHANGE OUT N/A 09/18/2014   Procedure: BIV PACEMAKER GENERATOR CHANGE OUT;  Surgeon: Thompson Grayer, MD;  Location: [facility] CATH LAB;  Service: Cardiovascular;  Laterality: N/A;  . DILATION AND CURETTAGE OF UTERUS    . EP IMPLANTABLE DEVICE N/A 07/28/2016   Procedure: ICD Implant;  Surgeon: Evans Lance, MD;  Location: [place] CV LAB;  Service: Cardiovascular;  Laterality: N/A;  . HYSTEROSCOPY    . PACEMAKER INSERTION     PTVP 08/2001 for complete heart block. Upgrade PTVP to MDT BiV 06/2008 by Dr Leonia Reeves  . TUBAL LIGATION      Current Outpatient Prescriptions  Medication Sig Dispense Refill  . acetaminophen (TYLENOL) 500 MG tablet Take 500-1,000 mg by mouth every 6 (six) hours as needed (for back pain).    [name] amiodarone (PACERONE) 200 MG tablet Take 1 tablet (200 mg total) by mouth daily. 90 tablet 3  . BIOTIN PO Take 1 capsule by mouth daily.    . Calcium Carbonate-Vitamin D (CALCIUM + D PO) Take 1 tablet by mouth daily.     . carvedilol (COREG) 3.125 MG tablet Take 1 tablet (3.125 mg total) by mouth 2 (two) times daily. 60 tablet 3  . COUMADIN 5 MG tablet TAKE AS DIRECTED 40 tablet 0  . fexofenadine (ALLEGRA) 180 MG tablet Take 180 mg by mouth daily as needed for allergies.     . furosemide (LASIX)  40 MG tablet TAKE 1 TABLET BY MOUTH  DAILY. MAY TAKE EXTRA 1/2  TABLET AS NEEDED FOR  SWELLING 135 tablet 2  . levothyroxine (SYNTHROID, LEVOTHROID) 50 MCG tablet Take 50 mcg by mouth daily.    [name] lisinopril (PRINIVIL,ZESTRIL) 2.5 MG tablet Take 1 tablet (2.5 mg total) by mouth daily. 90 tablet 3  . loratadine (CLARITIN) 10 MG tablet Take 10 mg by mouth daily as needed for allergies.    [name] MAGNESIUM PO Take 1 tablet by mouth daily.    . potassium chloride (K-DUR) 10 MEQ tablet Take 2 tablets (20 mEq total) by mouth daily. 180 tablet 2   No current facility-administered medications for this visit.      Allergies:   Patient has no known allergies.   Social History: Social History   Social History  . Marital status: Married    Spouse name: N/A  . Number of children: N/A  . Years of education: N/A   Occupational History  . Not on file.   Social History Main Topics  . Smoking status: Never Smoker  . Smokeless tobacco: Never Used  . Alcohol use Yes     Comment: rare  . Drug use: No  . Sexual activity: Yes    Birth control/ protection: Post-menopausal   Other Topics Concern  . Not on file   Social History Narrative  . No narrative on file    Family History: Family History  Problem Relation Age of Onset  . Diabetes Mother   . Multiple sclerosis Mother   . Cancer Father        ?  . CAD Unknown        Father's side CAD    Review of Systems: All other systems reviewed and are otherwise negative except as noted above.   Physical Exam: VS:  BP 114/88   Pulse 72   Ht 5\' 3"  (1.6 m)   Wt 137 lb 3.2 oz (62.2 kg)   LMP  (LMP Unknown)   BMI 24.30 kg/m  , BMI Body mass index is 24.3 kg/m.  GEN- The patient is well appearing, alert and oriented x 3 today.   HEENT: normocephalic, atraumatic; sclera clear, conjunctiva pink; hearing intact; oropharynx clear; neck supple  Lungs- Clear to ausculation bilaterally, normal work of breathing.  No wheezes, rales, rhonchi Heart- Regular rate and rhythm (paced) GI- soft, non-tender, non-distended, bowel sounds present  Extremities- no clubbing, cyanosis, or edema  MS- no significant deformity or atrophy Skin- warm and dry, no rash or lesion; ICD pocket well healed Psych- euthymic mood, full affect Neuro- strength and sensation are intact  ICD interrogation- reviewed in detail today,  See PACEART report  EKG:  EKG is not ordered today.  Recent Labs: 07/27/2016: B Natriuretic Peptide 281.5 07/28/2016: Platelets 134 10/31/2016: Hemoglobin 11.5; Magnesium 2.1; TSH 9.450 11/10/2016: ALT 14; BUN 17; Creatinine, Ser 1.75;  NT-Pro BNP 688; Potassium 4.4; Sodium 141   Wt Readings from Last 3 Encounters:  02/10/17 137 lb 3.2 oz (62.2 kg)  11/10/16 135 lb 8 oz (61.5 kg)  10/31/16 132 lb 3.2 oz (60 kg)      Assessment and Plan:  1.  Ventricular tachycardia No recurrence since last being seen in clinic Continue amiodarone 200mg  daily. Recent TSH, LFT's reviewed. Annual eye exams recommended  2.  Chronic systolic dysfunction euvolemic today Stable on an appropriate medical regimen Normal ICD function See Pace Art report No changes today Continue follow up in ICM clinic EF remains  depressed. Will schedule for AV opt echo next available time.   3.  CHB Normal device function  4.  Persistent atrial fibrillation Burden by device interrogation <1% V rates controlled Continue Warfarin for CHADS2VASC of 5   Current medicines are reviewed at length with the patient today.   The patient does not have concerns regarding her medicines.  The following changes were made today:  none  Labs/ tests ordered today include: AV opt echo Orders Placed This Encounter  Procedures  . CUP PACEART [place]  . ECHOCARDIOGRAM COMPLETE     Disposition:   Follow up with [facility], AV opt echo, Dr Rayann Heman 6 months    Signed, Chanetta Marshall, NP 02/10/2017 11:22 AM  [facility] [address] [place] [address] [phone] (office) [phone] (fax)

## 2017-02-10 ENCOUNTER — Ambulatory Visit (INDEPENDENT_AMBULATORY_CARE_PROVIDER_SITE_OTHER): Payer: Medicare Other | Admitting: Nurse Practitioner

## 2017-02-10 ENCOUNTER — Encounter: Payer: Self-pay | Admitting: Nurse Practitioner

## 2017-02-10 ENCOUNTER — Ambulatory Visit: Payer: Medicare Other | Admitting: Cardiology

## 2017-02-10 VITALS — BP 114/88 | HR 72 | Ht 63.0 in | Wt 137.2 lb

## 2017-02-10 DIAGNOSIS — I481 Persistent atrial fibrillation: Secondary | ICD-10-CM

## 2017-02-10 DIAGNOSIS — I472 Ventricular tachycardia, unspecified: Secondary | ICD-10-CM

## 2017-02-10 DIAGNOSIS — I442 Atrioventricular block, complete: Secondary | ICD-10-CM

## 2017-02-10 DIAGNOSIS — I5022 Chronic systolic (congestive) heart failure: Secondary | ICD-10-CM

## 2017-02-10 DIAGNOSIS — I4819 Other persistent atrial fibrillation: Secondary | ICD-10-CM

## 2017-02-10 LAB — CUP PACEART INCLINIC DEVICE CHECK
Date Time Interrogation Session: 20180629104952
Implantable Lead Implant Date: 20091104
Implantable Lead Implant Date: 20091104
Implantable Lead Implant Date: 20171214
Implantable Lead Location: 753858
Implantable Lead Location: 753859
Implantable Lead Location: 753860
Implantable Lead Model: 4196
Implantable Lead Model: 5076
Implantable Pulse Generator Implant Date: 20171214

## 2017-02-10 NOTE — Patient Instructions (Signed)
Medication Instructions:  Your physician recommends that you continue on your current medications as directed. Please refer to the Current Medication list given to you today.   Labwork: None Ordered   Testing/Procedures: Your physician has recommended that you have an AV optimization echo. During this procedure, an echocardiogram is performed to optimize the timing of your device using ultrasound and a device programmer. Changes will be made to the device settings to help the heart chambers pump more efficiently. This procedure takes approximately one hour.    Follow-Up:   Any Other Special Instructions Will Be Listed Below (If Applicable).     If you need a refill on your cardiac medications before your next appointment, please call your pharmacy.

## 2017-02-14 ENCOUNTER — Ambulatory Visit (INDEPENDENT_AMBULATORY_CARE_PROVIDER_SITE_OTHER): Payer: Medicare Other | Admitting: *Deleted

## 2017-02-14 DIAGNOSIS — I4891 Unspecified atrial fibrillation: Secondary | ICD-10-CM

## 2017-02-14 DIAGNOSIS — Z5181 Encounter for therapeutic drug level monitoring: Secondary | ICD-10-CM

## 2017-02-14 LAB — POCT INR: INR: 2.2

## 2017-02-17 NOTE — Progress Notes (Signed)
Cardiology Office Note   Date:  02/20/2017   ID:  [name], DOB June 23, 1951, MRN [id number]  PCP:  Kristi Orn, MD  Cardiologist:  Dr. Marlou Parker EP: Dr. Rayann Parker    Chief Complaint  Patient presents with  . Cardiomyopathy      History of Present Illness: Kristi Parker is a 66 y.o. female who presents for NICM.  Recently has increased her lasix to 40 mg daily due to weight up and down.  She is watching her diet with salt and has good understanding of what foods increase her wt.  No chest pain.  Her SOB is stable.    She has a history of sustained VT on amiodarone, NICM s/p CRT-D, CHB s/p PPM (upgraded later to CRT-D), secondary pulmonary HTN, previous CVA, PAF on coumadin, CKD, and mild MR.   She has a long history of NICM. Per Dr Kristi Parker, she had a cath with no CAD (I cannot find cath report- may have been at free standing cath lab.). She had a single chamber pacemaker implanted 2003 for CHB and upgrade to MDT CRT-P in 2009 with gen change 2016. She was admitted in 2017 for sustained, symptomatic VT that was pace terminated with her pacemaker. She was upgraded to CRT-D. She has remained on amiodarone since that time, but per Dr. Jackalyn Parker last note, this may de discontinued in the next few months.  Device History: MDT single chamber PPM implanted 2003; upgrade to [place] 2009; gen change 2016; CRTD implanted 2017 for VT History of appropriate therapy: No History of AAD therapy: Yes  With recent echo Ef 30-35% and plan for Echo and adjusting device for improved synchrony. This is planned for August.   On review of meds, her ACE is low dose but her Cr. Is elevated.  Her coreg is at low dose as well. The coreg was decreased due to hypotension.  This was in March 2018.  Her spironolactone was stopped.  K+ added.  Wt at home is running 132 lbs.  Now on lasix 40 mg daily.  Will need labs evaluated. Cr has climbed in past with increased lasix.   BP is great today.   PAF on coumadin,  INR per pharmacy here, has been maintaining SR.  She is asking about new vitamin with biotin.  Women's ultra normal hair.  Reviewed with pharmacist and cleared to take.   Past Medical History:  Diagnosis Date  . Adenomatous colon polyp   . Cervical radiculopathy at C5   . Chronic systolic heart failure ([place])   . CKD (chronic kidney disease), stage III   . DDD (degenerative disc disease), cervical   . DDD (degenerative disc disease), lumbar   . Fibroid   . GERD (gastroesophageal reflux disease)   . Hearing loss 02/2010   L hearing loss/vertigo, steroids  . History of seizure disorder    Related Hx  . Mitral regurgitation    moderate  . Multiple sclerosis ([place]) 1984  . Nonischemic cardiomyopathy ([facility])    Moderate LVEF 35-40% by ECHO 2011, 25-30% by echo 2013  . Permanent atrial fibrillation ([place])   . Pulmonary hypertension, secondary 06/04/2013   As a result of nonischemic cardiomyopathy EF 25-30%  . Stroke [facility])    Right brain CVA, complete recovery 07/2003  . Ventricular tachycardia ([place])   . Vertigo 02/2010   L hearing loss/vertigo, steroids    Past Surgical History:  Procedure Laterality Date  . BIV PACEMAKER GENERATOR CHANGE OUT N/A 09/18/2014  Procedure: BIV PACEMAKER GENERATOR CHANGE OUT;  Surgeon: Kristi Grayer, MD;  Location: [facility] CATH LAB;  Service: Cardiovascular;  Laterality: N/A;  . DILATION AND CURETTAGE OF UTERUS    . EP IMPLANTABLE DEVICE N/A 07/28/2016   Procedure: ICD Implant;  Surgeon: Kristi Lance, MD;  Location: [facility] CV LAB;  Service: Cardiovascular;  Laterality: N/A;  . HYSTEROSCOPY    . PACEMAKER INSERTION     PTVP 08/2001 for complete heart block. Upgrade PTVP to MDT BiV 06/2008 by Dr Kristi Parker  . TUBAL LIGATION       Current Outpatient Prescriptions  Medication Sig Dispense Refill  . acetaminophen (TYLENOL) 500 MG tablet Take 500-1,000 mg by mouth every 6 (six) hours as needed (for back pain).    [name] amiodarone (PACERONE) 200 MG tablet Take 1  tablet (200 mg total) by mouth daily. 90 tablet 3  . BIOTIN PO Take 1 capsule by mouth daily.    . Calcium Carbonate-Vitamin D (CALCIUM + D PO) Take 1 tablet by mouth daily.     . fexofenadine (ALLEGRA) 180 MG tablet Take 180 mg by mouth daily as needed for allergies.     . furosemide (LASIX) 40 MG tablet TAKE 1 TABLET BY MOUTH  DAILY. MAY TAKE EXTRA 1/2  TABLET AS NEEDED FOR  SWELLING 135 tablet 2  . levothyroxine (SYNTHROID, LEVOTHROID) 50 MCG tablet Take 50 mcg by mouth daily.    [name] lisinopril (PRINIVIL,ZESTRIL) 2.5 MG tablet Take 1 tablet (2.5 mg total) by mouth daily. 90 tablet 3  . loratadine (CLARITIN) 10 MG tablet Take 10 mg by mouth daily as needed for allergies.    [name] MAGNESIUM PO Take 1 tablet by mouth daily.    . potassium chloride (K-DUR) 10 MEQ tablet Take 2 tablets (20 mEq total) by mouth daily. 180 tablet 2  . carvedilol (COREG) 3.125 MG tablet TAKE 1 TABLET BY MOUTH TWICE A DAY 180 tablet 3  . COUMADIN 5 MG tablet TAKE AS DIRECTED 30 tablet 1   No current facility-administered medications for this visit.     Allergies:   Patient has no known allergies.    Social History:  The patient  reports that she has never smoked. She has never used smokeless tobacco. She reports that she drinks alcohol. She reports that she does not use drugs.   Family History:  The patient's family history includes Cancer in her father; Diabetes in her mother; Multiple sclerosis in her mother.    ROS:  General:no colds or fevers, no weight changes Skin:no rashes or ulcers HEENT:no blurred vision, no congestion CV:see HPI PUL:see HPI GI:no diarrhea constipation or melena, no indigestion GU:no hematuria, no dysuria MS:no joint pain, no claudication Neuro:no syncope, no lightheadedness Endo:no diabetes, no thyroid disease  Wt Readings from Last 3 Encounters:  02/20/17 136 lb (61.7 kg)  02/10/17 137 lb 3.2 oz (62.2 kg)  11/10/16 135 lb 8 oz (61.5 kg)     PHYSICAL EXAM: VS:  BP 122/60    Pulse 78   Ht 5\' 3"  (1.6 m)   Wt 136 lb (61.7 kg)   LMP  (LMP Unknown)   SpO2 98%   BMI 24.09 kg/m  , BMI Body mass index is 24.09 kg/m. General:Pleasant affect, NAD Skin:Warm and dry, brisk capillary refill HEENT:normocephalic, sclera clear, mucus membranes moist Neck:supple, no JVD, no bruits  Heart:S1S2 RRR without murmur, gallup, rub or click Lungs:clear without rales, rhonchi, or wheezes WUJ:WJXB, non tender, + BS, do not palpate liver spleen  or masses Ext:no lower ext edema, 2+ pedal pulses, 2+ radial pulses Neuro:alert and oriented X 3, MAE, follows commands, + facial symmetry    EKG:  EKG is NOT ordered today.  reviewed EKG from 10/2016 AV pacing, SR  Recent Labs: 07/27/2016: B Natriuretic Peptide 281.5 07/28/2016: Platelets 134 10/31/2016: Hemoglobin 11.5; Magnesium 2.1; TSH 9.450 11/10/2016: ALT 14; NT-Pro BNP 688 02/20/2017: BUN 23; Creatinine, Ser 1.85; Potassium 4.3; Sodium 141    Lipid Panel    Component Value Date/Time   CHOL 181 02/20/2017 0955   TRIG 68 02/20/2017 0955   HDL 66 02/20/2017 0955   CHOLHDL 2.7 02/20/2017 0955   CHOLHDL 4 07/18/2013 1100   VLDL 24.4 07/18/2013 1100   LDLCALC 101 (H) 02/20/2017 0955       Other studies Reviewed: Additional studies/ records that were reviewed today include: EKG from 10/2016.  Echo  01/2017  Study Conclusions  - Left ventricle: The cavity size was mildly dilated. Wall   thickness was normal. Systolic function was moderately reduced.   The estimated ejection fraction was in the range of 35% to 40%.   Diffuse hypokinesis. Doppler parameters are consistent with a   reversible restrictive pattern, indicative of decreased left   ventricular diastolic compliance and/or increased left atrial   pressure (grade 3 diastolic dysfunction). - Mitral valve: There was moderate regurgitation. - Left atrium: The atrium was severely dilated. - Right atrium: The atrium was mildly dilated.   ASSESSMENT AND PLAN:  1.   NICM with EF 30-35%,  Plan for CRT synchrony with echo in August.  She has increased her lasix to 40 mg daily for wt fluctuations on home scales, united HC monitors wt. Will check BMP today with hx of AKI in March.  She will continue to monitor wt and salt intake.  Her SOB is stable. Unable to increase ACE due to CKD and on higher doses of coreg she had hypotension.  2.  PAF maintaining SR, on coumadin for anticoagulation and stable, followed here.  3. VT with BiV ICD on amiodarone.  10/2016 TSH was elevated at 9.450 followed by PCP- in May 2018 TSH was 6.050  4.  CKD 3 though did develop acute kidney injury on higher dose os lasix, will need to follow closely.  Check bmp  5. Pulmonary HTN stable  6. Mild MR stable  7 hypothyroid followed by PCP  8. HLD  Current medicines are reviewed with the patient today.  The patient Has no concerns regarding medicines.  The following changes have been made:  See above Labs/ tests ordered today include:see above  Disposition:   FU:  see above  Signed, Kristi Kicks, NP  02/20/2017 5:08 PM    [facility], [place], [name], [place] Phone: [phone]; Fax: [phone]

## 2017-02-18 ENCOUNTER — Other Ambulatory Visit: Payer: Self-pay | Admitting: Cardiology

## 2017-02-20 ENCOUNTER — Encounter: Payer: Self-pay | Admitting: Cardiology

## 2017-02-20 ENCOUNTER — Ambulatory Visit (INDEPENDENT_AMBULATORY_CARE_PROVIDER_SITE_OTHER): Payer: Medicare Other | Admitting: Cardiology

## 2017-02-20 VITALS — BP 122/60 | HR 78 | Ht 63.0 in | Wt 136.0 lb

## 2017-02-20 DIAGNOSIS — N183 Chronic kidney disease, stage 3 unspecified: Secondary | ICD-10-CM

## 2017-02-20 DIAGNOSIS — Z95810 Presence of automatic (implantable) cardiac defibrillator: Secondary | ICD-10-CM

## 2017-02-20 DIAGNOSIS — I428 Other cardiomyopathies: Secondary | ICD-10-CM | POA: Diagnosis not present

## 2017-02-20 DIAGNOSIS — Z79899 Other long term (current) drug therapy: Secondary | ICD-10-CM

## 2017-02-20 DIAGNOSIS — I481 Persistent atrial fibrillation: Secondary | ICD-10-CM

## 2017-02-20 DIAGNOSIS — I472 Ventricular tachycardia, unspecified: Secondary | ICD-10-CM

## 2017-02-20 DIAGNOSIS — E782 Mixed hyperlipidemia: Secondary | ICD-10-CM

## 2017-02-20 DIAGNOSIS — I5022 Chronic systolic (congestive) heart failure: Secondary | ICD-10-CM

## 2017-02-20 DIAGNOSIS — I272 Pulmonary hypertension, unspecified: Secondary | ICD-10-CM

## 2017-02-20 DIAGNOSIS — I4819 Other persistent atrial fibrillation: Secondary | ICD-10-CM

## 2017-02-20 LAB — LIPID PANEL
Chol/HDL Ratio: 2.7 ratio (ref 0.0–4.4)
Cholesterol, Total: 181 mg/dL (ref 100–199)
HDL: 66 mg/dL (ref >39)
LDL Calculated: 101 mg/dL — ABNORMAL HIGH (ref 0–99)
Triglycerides: 68 mg/dL (ref 0–149)
VLDL Cholesterol Cal: 14 mg/dL (ref 5–40)

## 2017-02-20 LAB — BASIC METABOLIC PANEL
BUN/Creatinine Ratio: 12 (ref 12–28)
BUN: 23 mg/dL (ref 8–27)
CO2: 25 mmol/L (ref 20–29)
Calcium: 9.4 mg/dL (ref 8.7–10.3)
Chloride: 102 mmol/L (ref 96–106)
Creatinine, Ser: 1.85 mg/dL — ABNORMAL HIGH (ref 0.57–1.00)
GFR calc Af Amer: 32 mL/min/{1.73_m2} — ABNORMAL LOW (ref >59)
GFR calc non Af Amer: 28 mL/min/{1.73_m2} — ABNORMAL LOW (ref >59)
Glucose: 80 mg/dL (ref 65–99)
Potassium: 4.3 mmol/L (ref 3.5–5.2)
Sodium: 141 mmol/L (ref 134–144)

## 2017-02-20 NOTE — Patient Instructions (Signed)
Medication Instructions:  Your physician recommends that you continue on your current medications as directed. Please refer to the Current Medication list given to you today.   Labwork: Your physician recommends that you return for lab work today for BMET, LIPID PANEL   Testing/Procedures: None Ordered   Follow-Up: Your physician recommends that you schedule a follow-up appointment in: 3-4 months with Dr. Marlou Porch   Any Other Special Instructions Will Be Listed Below (If Applicable).     If you need a refill on your cardiac medications before your next appointment, please call your pharmacy.

## 2017-02-21 ENCOUNTER — Telehealth: Payer: Self-pay | Admitting: *Deleted

## 2017-02-21 DIAGNOSIS — Z79899 Other long term (current) drug therapy: Secondary | ICD-10-CM

## 2017-02-21 NOTE — Telephone Encounter (Signed)
-----   Message from Isaiah Serge, NP sent at 02/20/2017  4:50 PM EDT ----- Lipids stable with no CAD, cr mildly elevated now on lasix 40 daily - recheck BMP for renal insuff in 1-2 weeks to make sure stable.

## 2017-02-28 ENCOUNTER — Other Ambulatory Visit (INDEPENDENT_AMBULATORY_CARE_PROVIDER_SITE_OTHER): Payer: Medicare Other

## 2017-02-28 DIAGNOSIS — Z79899 Other long term (current) drug therapy: Secondary | ICD-10-CM

## 2017-02-28 LAB — BASIC METABOLIC PANEL
BUN/Creatinine Ratio: 13 (ref 12–28)
BUN: 20 mg/dL (ref 8–27)
CO2: 19 mmol/L — ABNORMAL LOW (ref 20–29)
Calcium: 9.4 mg/dL (ref 8.7–10.3)
Chloride: 102 mmol/L (ref 96–106)
Creatinine, Ser: 1.6 mg/dL — ABNORMAL HIGH (ref 0.57–1.00)
GFR calc Af Amer: 39 mL/min/{1.73_m2} — ABNORMAL LOW (ref >59)
GFR calc non Af Amer: 34 mL/min/{1.73_m2} — ABNORMAL LOW (ref >59)
Glucose: 88 mg/dL (ref 65–99)
Potassium: 4.6 mmol/L (ref 3.5–5.2)
Sodium: 140 mmol/L (ref 134–144)

## 2017-03-13 ENCOUNTER — Telehealth: Payer: Self-pay

## 2017-03-13 ENCOUNTER — Ambulatory Visit: Payer: Self-pay | Admitting: Podiatry

## 2017-03-13 ENCOUNTER — Ambulatory Visit (INDEPENDENT_AMBULATORY_CARE_PROVIDER_SITE_OTHER): Payer: Medicare Other

## 2017-03-13 DIAGNOSIS — Z95810 Presence of automatic (implantable) cardiac defibrillator: Secondary | ICD-10-CM | POA: Diagnosis not present

## 2017-03-13 DIAGNOSIS — I5022 Chronic systolic (congestive) heart failure: Secondary | ICD-10-CM | POA: Diagnosis not present

## 2017-03-13 NOTE — Telephone Encounter (Signed)
Remote ICM transmission received.  Attempted patient call and no answer or answering machine.  

## 2017-03-13 NOTE — Progress Notes (Signed)
EPIC Encounter for ICM Monitoring  Patient Name: Kristi Parker is a 66 y.o. female Date: 03/13/2017 Primary Care Physican: [name], MD Primary Cardiologist:[place]ains Electrophysiologist: Allred Dry Weight:Last known weight130lbs Bi-V Pacing: 99.3%  Clinical Status (10-Feb-2017 to 13-Mar-2017) Treated VT/VF 0 episodes  AT/AF 53 episodes  Time in AT/AF 0.4 hr/day (1.5%)  Observations (2) (10-Feb-2017 to 13-Mar-2017)  AT/AF >= 6 hr for 1 days.  Patient Activity less than 1 hr/day for 4 weeks.      Attempted call to patient and unable to reach.  Transmission reviewed.    Thoracic impedance is trending just below baseline suggesting fluid accumulation.  Prescribed dosage: Furosemide 40 mg 1 tablet (40 mg total) daily and may take extra 1/2 tablet for swelling. Potassium 10 mEq 2 tablet daily.  Labs: 02/28/2017 Creatinine 1.60, BUN 20, Potassium 4.6, Sodium 140, EGFR 34-39 02/20/2017 Creatinine 1.85, BUN 23, Potassium 4.3, Sodium 141, EGFR 28-32 11/10/2016 Creatinine 1.75, BUN 17, Potassium 4.4, Sodium 141, EGFR 30-35 10/31/2016 Creatinine 2.49, BUN 33, Potassium 4.7, Sodium 134, EGFR 20-23 07/28/2016 Creatinine 1.64, BUN 11, Potassium 3.5, Sodium 140, EGFR 32-37 07/27/2016 Creatinine 1.88, BUN 18, Potassium 3.9, Sodium 140, EGFR 27-31  NONE - Unable to reach patient   Follow-up plan: ICM clinic phone appointment on 03/28/2017 to recheck fluid levels.  Office appointment scheduled 04/13/2017 with Chanetta Marshall, NP.  Copy of ICM check sent to primary cardiologist and device physician.   3 month ICM trend: 03/13/2017   AT/AF     1 Year ICM trend:      [name], RN 03/13/2017 12:20 PM

## 2017-03-20 ENCOUNTER — Ambulatory Visit (INDEPENDENT_AMBULATORY_CARE_PROVIDER_SITE_OTHER): Payer: Medicare Other

## 2017-03-20 DIAGNOSIS — I4891 Unspecified atrial fibrillation: Secondary | ICD-10-CM

## 2017-03-20 DIAGNOSIS — Z5181 Encounter for therapeutic drug level monitoring: Secondary | ICD-10-CM | POA: Diagnosis not present

## 2017-03-20 LAB — POCT INR: INR: 2.7

## 2017-03-23 ENCOUNTER — Encounter: Payer: Self-pay | Admitting: Podiatry

## 2017-03-23 ENCOUNTER — Ambulatory Visit (INDEPENDENT_AMBULATORY_CARE_PROVIDER_SITE_OTHER): Payer: Medicare Other | Admitting: Podiatry

## 2017-03-23 VITALS — BP 115/67 | HR 70 | Resp 18

## 2017-03-23 DIAGNOSIS — M79674 Pain in right toe(s): Secondary | ICD-10-CM | POA: Diagnosis not present

## 2017-03-23 DIAGNOSIS — M79675 Pain in left toe(s): Secondary | ICD-10-CM

## 2017-03-23 DIAGNOSIS — S90229A Contusion of unspecified lesser toe(s) with damage to nail, initial encounter: Secondary | ICD-10-CM | POA: Diagnosis not present

## 2017-03-23 DIAGNOSIS — B351 Tinea unguium: Secondary | ICD-10-CM | POA: Diagnosis not present

## 2017-03-23 NOTE — Progress Notes (Signed)
   Subjective:    Patient ID: Kristi Parker, female    DOB: 05-15-1951, 66 y.o.   MRN: [id number]  HPI  66 year old female presents the office today for concerns of bilateral big toenail discoloration and thickening. This been ongoing for about 1 year which started after she was wearing new shoes and she thinks that because she is on Coumadin and she had some irritation of the nails causing bleeding and since the nails have been thick and discolored and a starting to come off. Denies any pain except with wearing shoes and socks. No redness or drainage or any swelling. She said no recent treatment for this. No other concerns.   Review of Systems  HENT: Positive for hearing loss.        Objective:   Physical Exam General: AAO x3, NAD  Dermatological: Bilateral hallux has very hypertrophic, dystrophic, discolored with yellow to brown discoloration there are some evidence of subungual hematoma. After debridement of the toenails able to remove some of the dried blood and there is no extension of any hyperpigmentation into the skin. Remaining nails are also somewhat discolored with yellow discoloration. There is minimal discomfort to bilateral hallux toenails there is no drainage or pus any clinical signs of infection present.  Vascular: DP 2/4, PT 1/4 with immedate capillary fill time. There is no pain with calf compression, swelling, warmth, erythema.   Neruologic: Grossly intact via light touch bilateral.  Protective threshold with Semmes Wienstein monofilament intact to all pedal sites bilateral.   Musculoskeletal: No gross boney pedal deformities bilateral. No pain, crepitus, or limitation noted with foot and ankle range of motion bilateral. Muscular strength 5/5 in all groups tested bilateral.  Gait: Unassisted, Nonantalgic.     Assessment & Plan:  66 year old female with onychodystrophy likely onychomycosis -Treatment options discussed including all alternatives, risks, and  complications -Etiology of symptoms were discussed -Discussed nail removal but she wishes to hold off and also given Coumadin decrease pulses recommend holding off on this as possible. Sharp and debrided bilateral hallux toenails to patient comfort. After debridement there is resolution of pain. There does be some evidence of clearing on the proximal nail borders bilateral hallux and hopefully a toenail grow out. I did prescribe a topical antifungal ointment through [employer]. -Follow-up in next couple months or sooner if needed. Call any questions concerns.  Celesta Gentile, DPM

## 2017-03-24 ENCOUNTER — Telehealth: Payer: Self-pay | Admitting: *Deleted

## 2017-03-24 NOTE — Telephone Encounter (Signed)
Pt asked if the polish would help the pain in her toenails when she worn enclosed shoes. Left message informing pt that the toenail polish for fungus would help as the unhealthy thickened nail grew out, but if she had an ingrown it would not help and she would need to see a doctor.

## 2017-03-27 ENCOUNTER — Encounter: Payer: Self-pay | Admitting: *Deleted

## 2017-03-28 ENCOUNTER — Ambulatory Visit (INDEPENDENT_AMBULATORY_CARE_PROVIDER_SITE_OTHER): Payer: Self-pay

## 2017-03-28 DIAGNOSIS — Z95810 Presence of automatic (implantable) cardiac defibrillator: Secondary | ICD-10-CM

## 2017-03-28 DIAGNOSIS — I5022 Chronic systolic (congestive) heart failure: Secondary | ICD-10-CM

## 2017-03-28 NOTE — Progress Notes (Signed)
EPIC Encounter for ICM Monitoring  Patient Name: Kristi Parker is a 66 y.o. female Date: 03/28/2017 Primary Care Physican: [name], MD Primary Cardiologist:[place] Electrophysiologist: Allred Dry Weight:Previous ICM weight130lbs Bi-V Pacing: 99.5%  Since 13-Mar-2017 VT-NS (>4 beats, >120 bpm) -  2      Heart Failure questions reviewed, pt symptomatic during decreased thoracic impedance.  She was on vacation for 2 weeks and ate at restaurants.    Thoracic impedance normal.  Prescribed dosage: Furosemide 40 mg 1tablet (40 mg total) daily and may take extra 1/2 tablet for swelling. Potassium 10 mEq 2 tablet daily.  Labs: 02/28/2017 Creatinine 1.60, BUN 20, Potassium 4.6, Sodium 140, EGFR 34-39 02/20/2017 Creatinine 1.85, BUN 23, Potassium 4.3, Sodium 141, EGFR 28-32 11/10/2016 Creatinine 1.75, BUN 17, Potassium 4.4, Sodium 141, EGFR 30-35 10/31/2016 Creatinine 2.49, BUN 33, Potassium 4.7, Sodium 134, EGFR 20-23 07/28/2016 Creatinine 1.64, BUN 11, Potassium 3.5, Sodium 140, EGFR 32-37 07/27/2016 Creatinine 1.88, BUN 18, Potassium 3.9, Sodium 140, EGFR 27-31  Recommendations: No changes.  Advised to limit salt intake to 2000 mg/day and fluid intake to < 2 liters/day.  Encouraged to call for fluid symptoms.  Follow-up plan: ICM clinic phone appointment on 05/23/2017.  Office appointment scheduled 04/13/2017 with Chanetta Marshall, NP.  Copy of ICM check sent to device physician.   3 month ICM trend: 03/28/2017   1 Year ICM trend:      [name], RN 03/28/2017 8:56 AM

## 2017-04-08 ENCOUNTER — Other Ambulatory Visit: Payer: Self-pay | Admitting: Cardiology

## 2017-04-12 NOTE — Progress Notes (Signed)
    AV Optimization Echo Note Date: 04/12/2017  ID:  Kristi Parker, DOB 1950/10/14, MRN [id number]  PCP: [name], MD Primary Cardiologist: Marlou Porch Electrophysiologist: Pryor Montes is a 66 y.o. female is seen today for Dr Rayann Heman.  She presents today for AV optimization of CRT.  Since last being seen in our clinic, the patient reports doing reasonably well. 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    The patient is predominately atrially paced.  Device was programmed with a  PAV of 130 and SAV of 100 on presentation.  Paced AV delays were evaluated from 100 msec to 180 msec.  Optimal separation of E/A waves was noted at 170 msec.  MR limited ability to optimize atrial filling.  Device was reprogrammed today to a SAV of 157msec and a PAV of 184msec.    The patient will return to see Dr Rayann Heman in 4 months   Signed, Chanetta Marshall, NP  04/12/2017 5:18 PM     [facility] [address] [place] [address] [phone] (office) [phone] (fax

## 2017-04-13 ENCOUNTER — Ambulatory Visit (HOSPITAL_COMMUNITY): Payer: Medicare Other | Attending: Cardiovascular Disease

## 2017-04-13 ENCOUNTER — Ambulatory Visit (INDEPENDENT_AMBULATORY_CARE_PROVIDER_SITE_OTHER): Payer: Medicare Other | Admitting: Nurse Practitioner

## 2017-04-13 ENCOUNTER — Other Ambulatory Visit: Payer: Self-pay

## 2017-04-13 ENCOUNTER — Other Ambulatory Visit: Payer: Self-pay | Admitting: Nurse Practitioner

## 2017-04-13 DIAGNOSIS — I270 Primary pulmonary hypertension: Secondary | ICD-10-CM | POA: Insufficient documentation

## 2017-04-13 DIAGNOSIS — I442 Atrioventricular block, complete: Secondary | ICD-10-CM | POA: Diagnosis present

## 2017-04-13 DIAGNOSIS — I428 Other cardiomyopathies: Secondary | ICD-10-CM | POA: Diagnosis not present

## 2017-04-13 DIAGNOSIS — Z95810 Presence of automatic (implantable) cardiac defibrillator: Secondary | ICD-10-CM | POA: Insufficient documentation

## 2017-04-13 DIAGNOSIS — I081 Rheumatic disorders of both mitral and tricuspid valves: Secondary | ICD-10-CM | POA: Insufficient documentation

## 2017-04-13 DIAGNOSIS — I509 Heart failure, unspecified: Secondary | ICD-10-CM | POA: Insufficient documentation

## 2017-04-13 DIAGNOSIS — I5022 Chronic systolic (congestive) heart failure: Secondary | ICD-10-CM

## 2017-04-13 DIAGNOSIS — Z8249 Family history of ischemic heart disease and other diseases of the circulatory system: Secondary | ICD-10-CM | POA: Insufficient documentation

## 2017-04-13 DIAGNOSIS — I4891 Unspecified atrial fibrillation: Secondary | ICD-10-CM | POA: Diagnosis not present

## 2017-04-13 DIAGNOSIS — N189 Chronic kidney disease, unspecified: Secondary | ICD-10-CM | POA: Diagnosis not present

## 2017-04-13 LAB — CUP PACEART INCLINIC DEVICE CHECK
Date Time Interrogation Session: 20180830104954
Implantable Lead Implant Date: 20091104
Implantable Lead Implant Date: 20091104
Implantable Lead Implant Date: 20171214
Implantable Lead Location: 753858
Implantable Lead Location: 753859
Implantable Lead Location: 753860
Implantable Lead Model: 4196
Implantable Lead Model: 5076
Implantable Pulse Generator Implant Date: 20171214

## 2017-05-01 ENCOUNTER — Encounter: Payer: Self-pay | Admitting: Cardiology

## 2017-05-11 ENCOUNTER — Ambulatory Visit (INDEPENDENT_AMBULATORY_CARE_PROVIDER_SITE_OTHER): Payer: Medicare Other | Admitting: *Deleted

## 2017-05-11 DIAGNOSIS — I4891 Unspecified atrial fibrillation: Secondary | ICD-10-CM | POA: Diagnosis not present

## 2017-05-11 DIAGNOSIS — Z5181 Encounter for therapeutic drug level monitoring: Secondary | ICD-10-CM | POA: Diagnosis not present

## 2017-05-11 LAB — POCT INR: INR: 2.8

## 2017-05-23 ENCOUNTER — Ambulatory Visit (INDEPENDENT_AMBULATORY_CARE_PROVIDER_SITE_OTHER): Payer: Medicare Other | Admitting: *Deleted

## 2017-05-23 ENCOUNTER — Telehealth: Payer: Self-pay

## 2017-05-23 DIAGNOSIS — I5022 Chronic systolic (congestive) heart failure: Secondary | ICD-10-CM | POA: Diagnosis not present

## 2017-05-23 DIAGNOSIS — Z95810 Presence of automatic (implantable) cardiac defibrillator: Secondary | ICD-10-CM | POA: Diagnosis not present

## 2017-05-23 DIAGNOSIS — I428 Other cardiomyopathies: Secondary | ICD-10-CM

## 2017-05-23 NOTE — Telephone Encounter (Signed)
Remote ICM transmission received.  Attempted call to patient and no answer or answering machine.  

## 2017-05-23 NOTE — Progress Notes (Signed)
EPIC Encounter for ICM Monitoring  Patient Name: Kristi Parker is a 66 y.o. female Date: 05/23/2017 Primary Care Physican: [name], MD Primary Cardiologist:Skains Electrophysiologist: Allred Dry Weight:Previous ICM weight130lbs Bi-V Pacing: 99.4%      Attempted call to patient and unable to reach.    Transmission reviewed.    Thoracic impedance normal.  Prescribed dosage: Furosemide 40 mg 1tablet (40 mg total) daily and may take extra 1/2 tablet for swelling. Potassium 10 mEq 2tablet daily.  Labs: 02/28/2017 Creatinine 1.60, BUN 20, Potassium 4.6, Sodium 140, EGFR 34-39 02/20/2017 Creatinine 1.85, BUN 23, Potassium 4.3, Sodium 141, EGFR 28-32 11/10/2016 Creatinine 1.75, BUN 17, Potassium 4.4, Sodium 141, EGFR 30-35 10/31/2016 Creatinine 2.49, BUN 33, Potassium 4.7, Sodium 134, EGFR 20-23 07/28/2016 Creatinine 1.64, BUN 11, Potassium 3.5, Sodium 140, EGFR 32-37 12/13/2017Creatinine 1.88, BUN 18, Potassium 3.9, Sodium 140, EGFR 27-31  Recommendations: NONE - Unable to reach patient   Follow-up plan: ICM clinic phone appointment on 06/23/2017.  Office appointment scheduled 06/06/2017 with Dr. Marlou Porch.  Copy of ICM check sent to Dr. Rayann Heman.   3 month ICM trend: 05/23/2017   1 Year ICM trend:      [name], RN 05/23/2017 11:50 AM

## 2017-05-24 NOTE — Progress Notes (Signed)
Remote ICD transmission.   

## 2017-05-25 LAB — CUP PACEART REMOTE DEVICE CHECK
Battery Remaining Longevity: 74 mo
Battery Voltage: 3 V
Brady Statistic AP VP Percent: 99.27 %
Brady Statistic AP VS Percent: 0.32 %
Brady Statistic AS VP Percent: 0.39 %
Brady Statistic AS VS Percent: 0.02 %
Brady Statistic RA Percent Paced: 99.44 %
Brady Statistic RV Percent Paced: 99.37 %
Date Time Interrogation Session: 20181009062703
HighPow Impedance: 37 Ohm
Implantable Lead Implant Date: 20091104
Implantable Lead Implant Date: 20091104
Implantable Lead Implant Date: 20171214
Implantable Lead Location: 753858
Implantable Lead Location: 753859
Implantable Lead Location: 753860
Implantable Lead Model: 4196
Implantable Lead Model: 5076
Implantable Pulse Generator Implant Date: 20171214
Lead Channel Impedance Value: 1083 Ohm
Lead Channel Impedance Value: 361 Ohm
Lead Channel Impedance Value: 418 Ohm
Lead Channel Impedance Value: 475 Ohm
Lead Channel Impedance Value: 551 Ohm
Lead Channel Impedance Value: 665 Ohm
Lead Channel Pacing Threshold Amplitude: 0.625 V
Lead Channel Pacing Threshold Amplitude: 1.75 V
Lead Channel Pacing Threshold Pulse Width: 0.4 ms
Lead Channel Pacing Threshold Pulse Width: 0.6 ms
Lead Channel Sensing Intrinsic Amplitude: 0.125 mV
Lead Channel Sensing Intrinsic Amplitude: 0.125 mV
Lead Channel Sensing Intrinsic Amplitude: 6.75 mV
Lead Channel Sensing Intrinsic Amplitude: 6.75 mV
Lead Channel Setting Pacing Amplitude: 2 V
Lead Channel Setting Pacing Amplitude: 2.5 V
Lead Channel Setting Pacing Amplitude: 2.75 V
Lead Channel Setting Pacing Pulse Width: 0.4 ms
Lead Channel Setting Pacing Pulse Width: 0.6 ms
Lead Channel Setting Sensing Sensitivity: 0.3 mV

## 2017-05-26 ENCOUNTER — Encounter: Payer: Self-pay | Admitting: Cardiology

## 2017-06-06 ENCOUNTER — Encounter: Payer: Self-pay | Admitting: Cardiology

## 2017-06-06 ENCOUNTER — Ambulatory Visit (INDEPENDENT_AMBULATORY_CARE_PROVIDER_SITE_OTHER): Payer: Medicare Other | Admitting: Cardiology

## 2017-06-06 VITALS — BP 120/60 | HR 75 | Ht 63.25 in | Wt 135.6 lb

## 2017-06-06 DIAGNOSIS — I472 Ventricular tachycardia, unspecified: Secondary | ICD-10-CM

## 2017-06-06 DIAGNOSIS — I480 Paroxysmal atrial fibrillation: Secondary | ICD-10-CM | POA: Diagnosis not present

## 2017-06-06 DIAGNOSIS — I428 Other cardiomyopathies: Secondary | ICD-10-CM | POA: Diagnosis not present

## 2017-06-06 DIAGNOSIS — I5022 Chronic systolic (congestive) heart failure: Secondary | ICD-10-CM

## 2017-06-06 DIAGNOSIS — Z95810 Presence of automatic (implantable) cardiac defibrillator: Secondary | ICD-10-CM | POA: Diagnosis not present

## 2017-06-06 NOTE — Progress Notes (Signed)
[facility]. [address], [address], [place]  [address] Phone: [phone] Fax:  [phone]  Date:  06/06/2017   ID:  Kristi Parker, DOB Apr 04, 1951, MRN [id number]  PCP:  [name], MD   History of Present Illness: Kristi Parker is a 66 y.o. female with episode of sustained ventricular tachycardia ,nonischemic cardiomyopathy ejection fraction previously 30-35% on 07/27/16, secondary pulmonary hypertension prior stroke over 10 years ago, paroxysmal atrial fibrillation on chronic anticoagulation, biventricular pacemaker (original version 2009 placed by Dr. Leonia Reeves), mild mitral regurgitation here for follow up.  She was in the hospital in 2017 for sustained symptomatic ventricular tachycardia that was pace terminated through her pacemaker. She had an upgrade on her pacemaker on that admission. No ICD shocks. She works out at [employer], times a week. CRT-D implanted in 2017 for ventricular tachycardia as stated above.  06/06/17-EP notes reviewed.  Resynchronization of her device performed.  Overall she is doing very well.  Occasionally will have a sharp chest discomfort when laying on her defibrillator.  This is quite rare.  She is exercising 3 times a week at [employer].  No further hypotension.   Wt Readings from Last 3 Encounters:  06/06/17 135 lb 9.6 oz (61.5 kg)  02/20/17 136 lb (61.7 kg)  02/10/17 137 lb 3.2 oz (62.2 kg)     Past Medical History:  Diagnosis Date  . Adenomatous colon polyp   . Cervical radiculopathy at C5   . Chronic systolic heart failure ([place])   . CKD (chronic kidney disease), stage III ([place])   . DDD (degenerative disc disease), cervical   . DDD (degenerative disc disease), lumbar   . Fibroid   . GERD (gastroesophageal reflux disease)   . Hearing loss 02/2010   L hearing loss/vertigo, steroids  . History of seizure disorder    Related Hx  . Mitral regurgitation    moderate  . Multiple sclerosis ([place]) 1984  . Nonischemic cardiomyopathy  ([facility])    Moderate LVEF 35-40% by ECHO 2011, 25-30% by echo 2013  . Permanent atrial fibrillation ([place])   . Pulmonary hypertension, secondary 06/04/2013   As a result of nonischemic cardiomyopathy EF 25-30%  . Stroke [facility])    Right brain CVA, complete recovery 07/2003  . Ventricular tachycardia ([place])   . Vertigo 02/2010   L hearing loss/vertigo, steroids    Past Surgical History:  Procedure Laterality Date  . BIV PACEMAKER GENERATOR CHANGE OUT N/A 09/18/2014   Procedure: BIV PACEMAKER GENERATOR CHANGE OUT;  Surgeon: Thompson Grayer, MD;  Location: [facility] CATH LAB;  Service: Cardiovascular;  Laterality: N/A;  . DILATION AND CURETTAGE OF UTERUS    . EP IMPLANTABLE DEVICE N/A 07/28/2016   Procedure: ICD Implant;  Surgeon: Evans Lance, MD;  Location: [facility] CV LAB;  Service: Cardiovascular;  Laterality: N/A;  . HYSTEROSCOPY    . PACEMAKER INSERTION     PTVP 08/2001 for complete heart block. Upgrade PTVP to MDT BiV 06/2008 by Dr Leonia Reeves  . TUBAL LIGATION      Current Outpatient Prescriptions  Medication Sig Dispense Refill  . acetaminophen (TYLENOL) 500 MG tablet Take 500-1,000 mg by mouth every 6 (six) hours as needed (for back pain).    . ALPHAGAN P 0.1 % SOLN Place 1 drop into the left eye 2 (two) times daily.    [name] amiodarone (PACERONE) 200 MG tablet Take 1 tablet (200 mg total) by mouth daily. 90 tablet 3  . BIOTIN PO  Take 1 capsule by mouth daily.    . Calcium Carbonate-Vitamin D (CALCIUM + D PO) Take 1 tablet by mouth daily.     . carvedilol (COREG) 3.125 MG tablet TAKE 1 TABLET BY MOUTH TWICE A DAY 180 tablet 3  . COUMADIN 5 MG tablet TAKE AS DIRECTED 30 tablet 1  . fexofenadine (ALLEGRA) 180 MG tablet Take 180 mg by mouth daily as needed for allergies.     . furosemide (LASIX) 40 MG tablet Take 40 mg by mouth daily. May take an extra 1/2 tablet by mouth as directed for swelling    . levothyroxine (SYNTHROID, LEVOTHROID) 50 MCG tablet Take 50 mcg by mouth daily.    [name] lisinopril  (PRINIVIL,ZESTRIL) 2.5 MG tablet TAKE 1 TABLET BY MOUTH  DAILY 90 tablet 2  . loratadine (CLARITIN) 10 MG tablet Take 10 mg by mouth daily as needed for allergies.    [name] MAGNESIUM PO Take 1 tablet by mouth daily.    [facility]  Onychomycosis Nail Lacquer -  Fluconazole 2%, Terbinafine 1% DMSO Apply to affected nail once daily Qty. 120 gm 3 refills    . potassium chloride (K-DUR) 10 MEQ tablet Take 2 tablets (20 mEq total) by mouth daily. 180 tablet 2   No current facility-administered medications for this visit.     Allergies:   No Known Allergies  Social History:  The patient  reports that she has never smoked. She has never used smokeless tobacco. She reports that she drinks alcohol. She reports that she does not use drugs.   ROS:  Please see the history of present illness.  No syncope, no bleeding, no orthopnea, no PND.   All other systems reviewed and negative.   PHYSICAL EXAM: VS:  BP 120/60   Pulse 75   Ht 5' 3.25" (1.607 m)   Wt 135 lb 9.6 oz (61.5 kg)   LMP  (LMP Unknown)   BMI 23.83 kg/m  GEN: Well nourished, well developed, in no acute distress  HEENT: normal  Neck: no JVD, carotid bruits, or masses Cardiac: RRR; soft systolic murmur, no rubs, or gallops,no edema  Respiratory:  clear to auscultation bilaterally, normal work of breathing GI: soft, nontender, nondistended, + BS MS: no deformity or atrophy  Skin: warm and dry, no rash Neuro:  Alert and Oriented x 3, Strength and sensation are intact Psych: euthymic mood, full affect   EKG:  EKG 10/26/16-AV sequential pacing heart rate 61 bpm. Only view-prior 07/21/16-ventricular pacing, currently in sinus rhythm verified by defibrillator interrogation AV pacing rate 69 bpm. Personally viewed-prior 05/11/16-V paced rhythm 67, no other abnormalities-prior Biventricular pacing rate 63, underlying atrial fibrillation, no change from prior.     ECHO 07/27/16:- - Left ventricle: The cavity size was  moderately dilated. Systolic   function was moderately to severely reduced. The estimated   ejection fraction was in the range of 30% to 35%. Diffuse   hypokinesis. - Aortic valve: There was trivial regurgitation. - Mitral valve: There was moderate regurgitation. - Left atrium: The atrium was moderately dilated. - Right atrium: The atrium was moderately dilated. - Atrial septum: No defect or patent foramen ovale was identified. - Tricuspid valve: There was moderate regurgitation. - Pulmonary arteries: PA peak pressure: 45 mm Hg (S).  ASSESSMENT AND PLAN:  1. Nonischemic cardiomyopathy-EF 35%. Normal coronary arteries.  Because of prior hypotension, carvedilol to 3.125 mg twice a day. Continue with amiodarone 200 mg once a day given her prior ventricular  tachycardia.  She is not on spironolactone anymore because of her hypotension. 2. Hypotension -resolved  3. Medtronic CRT defibrillator-Dr. Allred's. No discharges. Complete heart block treated.  4. Sustained ventricular tachycardia-amiodarone 200 mg. ICD is in place. Previously she was paced terminated.  No further episodes. 5. Atrial fibrillation-currently anticoagulated. Coumadin monitored. Prior stroke over 10 years ago.  Stable. 6. Pulmonary hypertension, secondary-as a result of cardiomyopathy. Overall mild.  No changes.  Continuing with exercise. 7. Mild mitral regurgitation-murmur is not that impressive. Continue to monitor. 8. 1 year follow-up.  She also has touches by EP throughout the year.  Signed, Candee Furbish, MD [facility]  06/06/2017 10:16 AM

## 2017-06-06 NOTE — Patient Instructions (Signed)
Your physician recommends that you continue on your current medications as directed. Please refer to the Current Medication list given to you today.  Your physician wants you to follow-up in: YEAR WITH DR SKAINS  You will receive a reminder letter in the mail two months in advance. If you don't receive a letter, please call our office to schedule the follow-up appointment.  

## 2017-06-09 ENCOUNTER — Encounter: Payer: Self-pay | Admitting: Cardiology

## 2017-06-14 ENCOUNTER — Telehealth: Payer: Self-pay | Admitting: Cardiology

## 2017-06-14 NOTE — Telephone Encounter (Signed)
Telephoned [facility] pharmacist back and sent Rx with different dose instructions for January 2018 Rx.

## 2017-06-14 NOTE — Telephone Encounter (Signed)
New message    [facility] pharmacy received audit for Coumadin  Prescription on January 2. Directions state  " take as directed". Payment to be revoked if clarification is not given. Please  Call [phone] ask for B

## 2017-06-15 ENCOUNTER — Other Ambulatory Visit: Payer: Self-pay | Admitting: Cardiology

## 2017-06-22 ENCOUNTER — Ambulatory Visit (INDEPENDENT_AMBULATORY_CARE_PROVIDER_SITE_OTHER): Payer: Medicare Other | Admitting: Pharmacist

## 2017-06-22 DIAGNOSIS — Z5181 Encounter for therapeutic drug level monitoring: Secondary | ICD-10-CM | POA: Diagnosis not present

## 2017-06-22 DIAGNOSIS — I480 Paroxysmal atrial fibrillation: Secondary | ICD-10-CM

## 2017-06-22 LAB — POCT INR: INR: 2.9

## 2017-06-23 ENCOUNTER — Ambulatory Visit: Payer: Medicare Other | Admitting: Podiatry

## 2017-06-23 ENCOUNTER — Ambulatory Visit (INDEPENDENT_AMBULATORY_CARE_PROVIDER_SITE_OTHER): Payer: Medicare Other

## 2017-06-23 DIAGNOSIS — I5022 Chronic systolic (congestive) heart failure: Secondary | ICD-10-CM | POA: Diagnosis not present

## 2017-06-23 DIAGNOSIS — B351 Tinea unguium: Secondary | ICD-10-CM

## 2017-06-23 DIAGNOSIS — Z95810 Presence of automatic (implantable) cardiac defibrillator: Secondary | ICD-10-CM | POA: Diagnosis not present

## 2017-06-23 DIAGNOSIS — M79674 Pain in right toe(s): Secondary | ICD-10-CM | POA: Diagnosis not present

## 2017-06-23 DIAGNOSIS — D689 Coagulation defect, unspecified: Secondary | ICD-10-CM | POA: Diagnosis not present

## 2017-06-23 DIAGNOSIS — M79675 Pain in left toe(s): Secondary | ICD-10-CM

## 2017-06-23 NOTE — Progress Notes (Signed)
EPIC Encounter for ICM Monitoring  Patient Name: Kristi Parker is a 66 y.o. female Date: 06/23/2017 Primary Care Physican: [name], MD Primary Cardiologist:Skains Electrophysiologist: Allred Dry Weight:128lbs Bi-V Pacing: 98.8%     Clinical Status (23-May-2017 to 23-Jun-2017) AT/AF 19  Time in AT/AF <0.1 hr/day (0.3%)  Longest AT/AF 35 minutes       Heart Failure questions reviewed, pt asymptomatic.   Optivol: Thoracic impedance normal.  Prescribed dosage: Furosemide 40 mg 1tablet (40 mg total) daily and may take extra 1/2 tablet for swelling. Potassium 10 mEq 2tablet daily.  Labs: 02/28/2017 Creatinine 1.60, BUN 20, Potassium 4.6, Sodium 140, EGFR 34-39 02/20/2017 Creatinine 1.85, BUN 23, Potassium 4.3, Sodium 141, EGFR 28-32 11/10/2016 Creatinine 1.75, BUN 17, Potassium 4.4, Sodium 141, EGFR 30-35 10/31/2016 Creatinine 2.49, BUN 33, Potassium 4.7, Sodium 134, EGFR 20-23 07/28/2016 Creatinine 1.64, BUN 11, Potassium 3.5, Sodium 140, EGFR 32-37 12/13/2017Creatinine 1.88, BUN 18, Potassium 3.9, Sodium 140, EGFR 27-31  Recommendations: No changes.   Encouraged to call for fluid symptoms.  Follow-up plan: ICM clinic phone appointment on 07/25/2017.    Copy of ICM check sent to Dr. Rayann Heman.   3 month ICM trend: 06/23/2017    1 Year ICM trend:       [name], RN 06/23/2017 2:29 PM

## 2017-06-25 NOTE — Progress Notes (Signed)
Subjective: 66 y.o. returns the office today for painful, elongated, thickened toenails which she cannot trim herself. Denies any redness or drainage around the nails. Denies any acute changes since last appointment and no new complaints today. Denies any systemic complaints such as fevers, chills, nausea, vomiting.   PCP: [name], MD  She is on coumadin  Objective: AAO 3, NAD DP pulses 2/4, PT 1/4, CRT less than 3 seconds- unchanged and denies any claudication symptoms Nails hypertrophic, dystrophic, elongated, brittle, discolored 10. There is tenderness overlying the nails 1-5 bilaterally. There is no surrounding erythema or drainage along the nail sites. Subungual hematoma is growing out.  No open lesions or pre-ulcerative lesions are identified. No other areas of tenderness bilateral lower extremities. No overlying edema, erythema, increased warmth. No pain with calf compression, swelling, warmth, erythema.  Assessment: Patient presents with symptomatic onychomycosis  Plan: -Treatment options including alternatives, risks, complications were discussed -Nails sharply debrided 10 without complication/bleeding. -Discussed daily foot inspection. If there are any changes, to call the office immediately.  -Follow-up in 3 months or sooner if any problems are to arise. In the meantime, encouraged to call the office with any questions, concerns, changes symptoms.  Celesta Gentile, DPM

## 2017-07-14 ENCOUNTER — Telehealth: Payer: Self-pay | Admitting: Podiatry

## 2017-07-14 MED ORDER — NONFORMULARY OR COMPOUNDED ITEM: 3 refills | Status: DC

## 2017-07-14 NOTE — Telephone Encounter (Signed)
I informed pt of Dr. Leigh Aurora statement and pt states the blackness HAD flaked out from under the nail, but the toenail had not fallen off, there was tenderness that was getting better since the trimming. I told pt I would inform Dr. Jacqualyn Posey and refill the nail lacquer.

## 2017-07-14 NOTE — Telephone Encounter (Signed)
I was wondering if I need to refill the nail liquor from the pharmacy in [place]. I'm almost out. However, since he last cut my nails there is not black streaks on my toenail but the old toenail has not fallen off. If I do refill this, how do I go about that? Do I contact you or the pharmacy in [place]? Please call me back at [phone].

## 2017-07-14 NOTE — Telephone Encounter (Signed)
Please have her come in to evaluate the color change.

## 2017-07-14 NOTE — Addendum Note (Signed)
Addended by: Harriett Sine D on: 07/14/2017 02:12 PM   Modules accepted: Orders

## 2017-07-25 ENCOUNTER — Ambulatory Visit (INDEPENDENT_AMBULATORY_CARE_PROVIDER_SITE_OTHER): Payer: Medicare Other

## 2017-07-25 DIAGNOSIS — Z95810 Presence of automatic (implantable) cardiac defibrillator: Secondary | ICD-10-CM | POA: Diagnosis not present

## 2017-07-25 DIAGNOSIS — I5022 Chronic systolic (congestive) heart failure: Secondary | ICD-10-CM

## 2017-07-27 NOTE — Progress Notes (Signed)
EPIC Encounter for ICM Monitoring  Patient Name: Kristi Parker is a 66 y.o. female Date: 07/27/2017 Primary Care Physican: [name], MD Primary Cardiologist:Skains Electrophysiologist: Allred Dry Weight:Previous weight128lbs Bi-V Pacing: 99.3%  Clinical Status (23-Jun-2017 to 25-Jul-2017) Treated VT/VF 0 episodes  AT/AF 47 episodes  Time in AT/AF 0.3 hr/day (1.3%)  Observations (1) (23-Jun-2017 to 25-Jul-2017)  Patient Activity less than 1 hr/day for 4 weeks.       Transmission reviewed.   Thoracic impedance normal.  Prescribed dosage: Furosemide 40 mg 1tablet (40 mg total) daily and may take extra 1/2 tablet for swelling. Potassium 10 mEq 2tablet daily.  Labs: 02/28/2017 Creatinine 1.60, BUN 20, Potassium 4.6, Sodium 140, EGFR 34-39 02/20/2017 Creatinine 1.85, BUN 23, Potassium 4.3, Sodium 141, EGFR 28-32 11/10/2016 Creatinine 1.75, BUN 17, Potassium 4.4, Sodium 141, EGFR 30-35 10/31/2016 Creatinine 2.49, BUN 33, Potassium 4.7, Sodium 134, EGFR 20-23 07/28/2016 Creatinine 1.64, BUN 11, Potassium 3.5, Sodium 140, EGFR 32-37 12/13/2017Creatinine 1.88, BUN 18, Potassium 3.9, Sodium 140, EGFR 27-31  Recommendations: No changes.    Follow-up plan: ICM clinic phone appointment on 08/28/2017.    Copy of ICM check sent to Dr. Rayann Heman.   3 month ICM trend: 07/25/2017   AT/AF    1 Year ICM trend:       [name], RN 07/27/2017 4:16 PM

## 2017-08-03 ENCOUNTER — Ambulatory Visit: Payer: Medicare Other | Admitting: *Deleted

## 2017-08-03 DIAGNOSIS — I480 Paroxysmal atrial fibrillation: Secondary | ICD-10-CM

## 2017-08-03 DIAGNOSIS — Z5181 Encounter for therapeutic drug level monitoring: Secondary | ICD-10-CM

## 2017-08-03 LAB — POCT INR: INR: 4.6

## 2017-08-03 NOTE — Patient Instructions (Signed)
Description   Skip today and Friday's dose, then Continue on same dosage 1/2 tablet daily except No Coumadin on Mondays.  Recheck in 2 weeks. Call our office if you are placed on any new medications or if you have any upcoming procedures [phone].

## 2017-08-17 ENCOUNTER — Ambulatory Visit: Payer: Medicare Other | Admitting: *Deleted

## 2017-08-17 DIAGNOSIS — I480 Paroxysmal atrial fibrillation: Secondary | ICD-10-CM | POA: Diagnosis not present

## 2017-08-17 DIAGNOSIS — Z5181 Encounter for therapeutic drug level monitoring: Secondary | ICD-10-CM

## 2017-08-17 LAB — POCT INR: INR: 2.7

## 2017-08-17 NOTE — Patient Instructions (Signed)
Description    Continue on same dosage 1/2 tablet daily except No Coumadin on Mondays.  Recheck in 3 weeks. Call our office if you are placed on any new medications or if you have any upcoming procedures [phone].

## 2017-08-28 ENCOUNTER — Ambulatory Visit (INDEPENDENT_AMBULATORY_CARE_PROVIDER_SITE_OTHER): Payer: Medicare Other | Admitting: *Deleted

## 2017-08-28 DIAGNOSIS — I5022 Chronic systolic (congestive) heart failure: Secondary | ICD-10-CM

## 2017-08-28 DIAGNOSIS — Z95810 Presence of automatic (implantable) cardiac defibrillator: Secondary | ICD-10-CM

## 2017-08-28 DIAGNOSIS — I428 Other cardiomyopathies: Secondary | ICD-10-CM

## 2017-08-28 NOTE — Progress Notes (Signed)
EPIC Encounter for ICM Monitoring  Patient Name: Kristi Parker is a 67 y.o. female Date: 08/28/2017 Primary Care Physican: [name], MD Primary Cardiologist:Skains Electrophysiologist: Allred Dry Weight:125lbs  (baseline 123-125 lbs) Bi-V Pacing: 99.3%   Clinical Status (25-Jul-2017 to 28-Aug-2017) Treated VT/VF 0 episodes  AT/AF 2 episodes  Time in AT/AF <0.1 hr/day (<0.1%)     Heart Failure questions reviewed, pt asymptomatic.   Thoracic impedance normal.  Prescribed dosage: Furosemide 40 mg 1tablet (40 mg total) daily and may take extra 1/2 tablet for swelling. Potassium 10 mEq 2tablet daily.  Labs: 02/28/2017 Creatinine 1.60, BUN 20, Potassium 4.6, Sodium 140, EGFR 34-39 02/20/2017 Creatinine 1.85, BUN 23, Potassium 4.3, Sodium 141, EGFR 28-32 11/10/2016 Creatinine 1.75, BUN 17, Potassium 4.4, Sodium 141, EGFR 30-35 10/31/2016 Creatinine 2.49, BUN 33, Potassium 4.7, Sodium 134, EGFR 20-23 07/28/2016 Creatinine 1.64, BUN 11, Potassium 3.5, Sodium 140, EGFR 32-37 12/13/2017Creatinine 1.88, BUN 18, Potassium 3.9, Sodium 140, EGFR 27-31  Recommendations: No changes.   Encouraged to call for fluid symptoms.  Follow-up plan: ICM clinic phone appointment on 09/28/2017.   Copy of ICM check sent to Dr. Rayann Heman.   3 month ICM trend: 08/28/2017    1 Year ICM trend:       [name], RN 08/28/2017 4:07 PM

## 2017-08-30 LAB — CUP PACEART REMOTE DEVICE CHECK
Battery Remaining Longevity: 69 mo
Battery Voltage: 2.99 V
Brady Statistic AP VP Percent: 98.93 %
Brady Statistic AP VS Percent: 0.27 %
Brady Statistic AS VP Percent: 0.79 %
Brady Statistic AS VS Percent: 0.01 %
Brady Statistic RA Percent Paced: 98.99 %
Brady Statistic RV Percent Paced: 99.36 %
Date Time Interrogation Session: 20190114062504
HighPow Impedance: 38 Ohm
Implantable Lead Implant Date: 20091104
Implantable Lead Implant Date: 20091104
Implantable Lead Implant Date: 20171214
Implantable Lead Location: 753858
Implantable Lead Location: 753859
Implantable Lead Location: 753860
Implantable Lead Model: 4196
Implantable Lead Model: 5076
Implantable Pulse Generator Implant Date: 20171214
Lead Channel Impedance Value: 361 Ohm
Lead Channel Impedance Value: 418 Ohm
Lead Channel Impedance Value: 475 Ohm
Lead Channel Impedance Value: 513 Ohm
Lead Channel Impedance Value: 589 Ohm
Lead Channel Impedance Value: 988 Ohm
Lead Channel Pacing Threshold Amplitude: 0.5 V
Lead Channel Pacing Threshold Amplitude: 1.5 V
Lead Channel Pacing Threshold Pulse Width: 0.4 ms
Lead Channel Pacing Threshold Pulse Width: 0.6 ms
Lead Channel Sensing Intrinsic Amplitude: 0.25 mV
Lead Channel Sensing Intrinsic Amplitude: 0.25 mV
Lead Channel Sensing Intrinsic Amplitude: 6.25 mV
Lead Channel Sensing Intrinsic Amplitude: 6.25 mV
Lead Channel Setting Pacing Amplitude: 2 V
Lead Channel Setting Pacing Amplitude: 2.5 V
Lead Channel Setting Pacing Amplitude: 2.75 V
Lead Channel Setting Pacing Pulse Width: 0.4 ms
Lead Channel Setting Pacing Pulse Width: 0.6 ms
Lead Channel Setting Sensing Sensitivity: 0.3 mV

## 2017-08-30 NOTE — Progress Notes (Signed)
Remote ICD transmission.   

## 2017-09-01 ENCOUNTER — Encounter: Payer: Self-pay | Admitting: Cardiology

## 2017-09-01 ENCOUNTER — Other Ambulatory Visit: Payer: Self-pay | Admitting: Cardiology

## 2017-09-07 ENCOUNTER — Ambulatory Visit: Payer: Medicare Other | Admitting: *Deleted

## 2017-09-07 DIAGNOSIS — I480 Paroxysmal atrial fibrillation: Secondary | ICD-10-CM | POA: Diagnosis not present

## 2017-09-07 DIAGNOSIS — Z5181 Encounter for therapeutic drug level monitoring: Secondary | ICD-10-CM

## 2017-09-07 LAB — POCT INR: INR: 4

## 2017-09-07 NOTE — Patient Instructions (Signed)
Description   Do not take any Coumadin today then continue on same dosage 1/2 tablet daily except No Coumadin on Mondays.  Recheck in 2 weeks. Call our office if you are placed on any new medications or if you have any upcoming procedures [phone].

## 2017-09-21 ENCOUNTER — Ambulatory Visit: Payer: Medicare Other | Admitting: Pharmacist

## 2017-09-21 DIAGNOSIS — I480 Paroxysmal atrial fibrillation: Secondary | ICD-10-CM | POA: Diagnosis not present

## 2017-09-21 DIAGNOSIS — Z5181 Encounter for therapeutic drug level monitoring: Secondary | ICD-10-CM | POA: Diagnosis not present

## 2017-09-21 LAB — POCT INR: INR: 4.3

## 2017-09-21 NOTE — Patient Instructions (Signed)
Description   Do not take any Coumadin today then continue on same dosage 1/2 tablet daily except No Coumadin on Mondays.  Recheck in 2 weeks. Call our office if you are placed on any new medications or if you have any upcoming procedures [phone].

## 2017-09-28 ENCOUNTER — Ambulatory Visit (INDEPENDENT_AMBULATORY_CARE_PROVIDER_SITE_OTHER): Payer: Medicare Other

## 2017-09-28 ENCOUNTER — Ambulatory Visit: Payer: Medicare Other | Admitting: *Deleted

## 2017-09-28 DIAGNOSIS — Z95810 Presence of automatic (implantable) cardiac defibrillator: Secondary | ICD-10-CM

## 2017-09-28 DIAGNOSIS — Z5181 Encounter for therapeutic drug level monitoring: Secondary | ICD-10-CM | POA: Diagnosis not present

## 2017-09-28 DIAGNOSIS — I480 Paroxysmal atrial fibrillation: Secondary | ICD-10-CM | POA: Diagnosis not present

## 2017-09-28 DIAGNOSIS — I5022 Chronic systolic (congestive) heart failure: Secondary | ICD-10-CM | POA: Diagnosis not present

## 2017-09-28 LAB — POCT INR: INR: 3.2

## 2017-09-28 NOTE — Progress Notes (Signed)
EPIC Encounter for ICM Monitoring  Patient Name: Kristi Parker is a 67 y.o. female Date: 09/28/2017 Primary Care Physican: [name], MD Primary Cardiologist:Skains Electrophysiologist: Allred Dry Weight:125lbs  (baseline 123-125 lbs) Bi-V Pacing: 99.4%        Heart Failure questions reviewed, pt asymptomatic.   Thoracic impedance close to baseline normal.  Prescribed dosage: Furosemide 40 mg 1tablet (40 mg total) daily and may take extra 1/2 tablet for swelling. Potassium 10 mEq 2tablet daily.  Labs: 02/28/2017 Creatinine 1.60, BUN 20, Potassium 4.6, Sodium 140, EGFR 34-39 02/20/2017 Creatinine 1.85, BUN 23, Potassium 4.3, Sodium 141, EGFR 28-32 11/10/2016 Creatinine 1.75, BUN 17, Potassium 4.4, Sodium 141, EGFR 30-35 10/31/2016 Creatinine 2.49, BUN 33, Potassium 4.7, Sodium 134, EGFR 20-23 07/28/2016 Creatinine 1.64, BUN 11, Potassium 3.5, Sodium 140, EGFR 32-37 12/13/2017Creatinine 1.88, BUN 18, Potassium 3.9, Sodium 140, EGFR 27-31  Recommendations: No changes.    Encouraged to call for fluid symptoms.  Follow-up plan: ICM clinic phone appointment on 10/30/2017.    Copy of ICM check sent to Dr. Rayann Heman.   3 month ICM trend: 09/28/2017    1 Year ICM trend:       [name], RN 09/28/2017 2:49 PM

## 2017-09-28 NOTE — Patient Instructions (Signed)
Description   Do not take any Coumadin today then continue on same dosage 1/2 tablet daily except No Coumadin on Mondays.  Check to see how many 5mg  tablets you have at home (may need to switch to 2.5mg  tablet). Recheck in 2 weeks. Call our office if you are placed on any new medications or if you have any upcoming procedures [phone].

## 2017-09-29 ENCOUNTER — Ambulatory Visit: Payer: Medicare Other | Admitting: Podiatry

## 2017-10-02 ENCOUNTER — Ambulatory Visit: Payer: Medicare Other | Admitting: Podiatry

## 2017-10-02 ENCOUNTER — Encounter: Payer: Self-pay | Admitting: Podiatry

## 2017-10-02 DIAGNOSIS — M79675 Pain in left toe(s): Secondary | ICD-10-CM | POA: Diagnosis not present

## 2017-10-02 DIAGNOSIS — M79674 Pain in right toe(s): Secondary | ICD-10-CM

## 2017-10-02 DIAGNOSIS — D689 Coagulation defect, unspecified: Secondary | ICD-10-CM

## 2017-10-02 DIAGNOSIS — B351 Tinea unguium: Secondary | ICD-10-CM

## 2017-10-04 NOTE — Progress Notes (Signed)
Subjective: 67 y.o. returns the office today for painful, elongated, thickened toenails which she cannot trim herself. Denies any redness or drainage around the nails.  She is been using a topical antifungal which is been helping.  She states that the big toenails get ingrown denies any drainage or pus otherwise.  Denies any acute changes since last appointment and no new complaints today. Denies any systemic complaints such as fevers, chills, nausea, vomiting.   PCP: [name], MD  She is on coumadin  Objective: AAO 3, NAD DP pulses 2/4, PT 1/4, CRT less than 3 seconds- unchanged and denies any claudication symptoms Nails hypertrophic, dystrophic, elongated, brittle, discolored 10.  Incurvation on the bilateral hallux toenails along both corners.  No signs of infection.  There is tenderness overlying the nails 1-5 bilaterally. There is no surrounding erythema or drainage along the nail sites. Subungual hematoma is growing out.  No open lesions or pre-ulcerative lesions are identified. No other areas of tenderness bilateral lower extremities. No overlying edema, erythema, increased warmth. No pain with calf compression, swelling, warmth, erythema.  Assessment: Patient presents with symptomatic onychomycosis  Plan: -Treatment options including alternatives, risks, complications were discussed -Nails sharply debrided 10 without complication/bleeding. -Discussed partial nail avulsions due to the ingrown toenails but wishes to hold off on them. -Continue topical antifungal. -Discussed daily foot inspection. If there are any changes, to call the office immediately.  -Follow-up in 3 months or sooner if any problems are to arise. In the meantime, encouraged to call the office with any questions, concerns, changes symptoms.  Celesta Gentile, DPM

## 2017-10-12 ENCOUNTER — Ambulatory Visit (INDEPENDENT_AMBULATORY_CARE_PROVIDER_SITE_OTHER): Payer: Medicare Other | Admitting: Pharmacist

## 2017-10-12 DIAGNOSIS — Z5181 Encounter for therapeutic drug level monitoring: Secondary | ICD-10-CM | POA: Diagnosis not present

## 2017-10-12 LAB — POCT INR: INR: 2.8

## 2017-10-12 NOTE — Patient Instructions (Signed)
Description   Continue on same dosage 1/2 tablet daily except No Coumadin on Mondays. Recheck in 4 weeks. Call our office if you are placed on any new medications or if you have any upcoming procedures [phone].

## 2017-10-23 ENCOUNTER — Other Ambulatory Visit: Payer: Self-pay | Admitting: Cardiology

## 2017-10-30 ENCOUNTER — Telehealth: Payer: Self-pay

## 2017-10-30 ENCOUNTER — Ambulatory Visit (INDEPENDENT_AMBULATORY_CARE_PROVIDER_SITE_OTHER): Payer: Medicare Other

## 2017-10-30 DIAGNOSIS — Z95810 Presence of automatic (implantable) cardiac defibrillator: Secondary | ICD-10-CM

## 2017-10-30 DIAGNOSIS — I5022 Chronic systolic (congestive) heart failure: Secondary | ICD-10-CM

## 2017-10-30 NOTE — Progress Notes (Signed)
EPIC Encounter for ICM Monitoring  Patient Name: Kristi Parker is a 67 y.o. female Date: 10/30/2017 Primary Care Physican: [name], MD Primary Cardiologist:Skains Electrophysiologist: Allred Dry Weight:Previous weight 125lbs (baseline 123-125 lbs) Bi-V Pacing: 97.6%          Attempted call to patient and unable to reach.    Transmission reviewed.    Thoracic impedance abnormal suggesting fluid accumulation.  Prescribed dosage: Furosemide 40 mg 1tablet (40 mg total) daily and may take extra 1/2 tablet for swelling. Potassium 10 mEq 2tablet daily.  Labs: 02/28/2017 Creatinine 1.60, BUN 20, Potassium 4.6, Sodium 140, EGFR 34-39 02/20/2017 Creatinine 1.85, BUN 23, Potassium 4.3, Sodium 141, EGFR 28-32 11/10/2016 Creatinine 1.75, BUN 17, Potassium 4.4, Sodium 141, EGFR 30-35 10/31/2016 Creatinine 2.49, BUN 33, Potassium 4.7, Sodium 134, EGFR 20-23 07/28/2016 Creatinine 1.64, BUN 11, Potassium 3.5, Sodium 140, EGFR 32-37 12/13/2017Creatinine 1.88, BUN 18, Potassium 3.9, Sodium 140, EGFR 27-31  Recommendations: NONE - Unable to reach.  Follow-up plan: ICM clinic phone appointment on 11/09/2017.    Copy of ICM check sent to Dr. Rayann Heman and Dr. Marlou Porch.   3 month ICM trend: 10/30/2017    1 Year ICM trend:       [name], RN 10/30/2017 4:39 PM

## 2017-10-30 NOTE — Telephone Encounter (Signed)
Remote ICM transmission received.  Attempted call to patient and no answer or voice mail set up

## 2017-11-09 ENCOUNTER — Ambulatory Visit (INDEPENDENT_AMBULATORY_CARE_PROVIDER_SITE_OTHER): Payer: Self-pay

## 2017-11-09 ENCOUNTER — Ambulatory Visit: Payer: Medicare Other | Admitting: *Deleted

## 2017-11-09 DIAGNOSIS — Z5181 Encounter for therapeutic drug level monitoring: Secondary | ICD-10-CM

## 2017-11-09 DIAGNOSIS — I4891 Unspecified atrial fibrillation: Secondary | ICD-10-CM

## 2017-11-09 DIAGNOSIS — I5022 Chronic systolic (congestive) heart failure: Secondary | ICD-10-CM

## 2017-11-09 DIAGNOSIS — Z95810 Presence of automatic (implantable) cardiac defibrillator: Secondary | ICD-10-CM

## 2017-11-09 LAB — POCT INR: INR: 4.5

## 2017-11-09 NOTE — Patient Instructions (Signed)
Description   Do not take any Coumadin today and tomorrow then continue on same dosage 1/2 tablet daily except No Coumadin on Mondays. Recheck in 2 weeks. Call our office if you are placed on any new medications or if you have any upcoming procedures [phone].

## 2017-11-09 NOTE — Progress Notes (Signed)
EPIC Encounter for ICM Monitoring  Patient Name: Kristi Parker is a 67 y.o. female Date: 11/09/2017 Primary Care Physican: [name], MD Primary Cardiologist:Skains Electrophysiologist: Allred Dry Weight:Previous weight 125lbs (baseline 123-125 lbs) Bi-V Pacing: 98.3%       Attempted call to patient and unable to reach.  Left detailed message regarding transmission.  Transmission reviewed.    Thoracic impedance returned to normal after last ICM transmission on 10/30/2017.  Prescribed dosage: Furosemide 40 mg 1tablet (40 mg total) daily and may take extra 1/2 tablet for swelling. Potassium 10 mEq 2tablet daily.  Labs: 02/28/2017 Creatinine 1.60, BUN 20, Potassium 4.6, Sodium 140, EGFR 34-39 02/20/2017 Creatinine 1.85, BUN 23, Potassium 4.3, Sodium 141, EGFR 28-32 11/10/2016 Creatinine 1.75, BUN 17, Potassium 4.4, Sodium 141, EGFR 30-35 10/31/2016 Creatinine 2.49, BUN 33, Potassium 4.7, Sodium 134, EGFR 20-23 07/28/2016 Creatinine 1.64, BUN 11, Potassium 3.5, Sodium 140, EGFR 32-37 12/13/2017Creatinine 1.88, BUN 18, Potassium 3.9, Sodium 140, EGFR 27-31  Recommendations: Left voice mail with ICM number and encouraged to call if experiencing any fluid symptoms.  Follow-up plan: ICM clinic phone appointment on 11/30/2017.    Copy of ICM check sent to Dr. Rayann Heman.   3 month ICM trend: 11/09/2017    1 Year ICM trend:       [name], RN 11/09/2017 10:42 AM

## 2017-11-10 ENCOUNTER — Telehealth: Payer: Self-pay

## 2017-11-10 NOTE — Telephone Encounter (Signed)
Remote ICM transmission received.  Attempted call to patient and left detailed message per DPR regarding transmission and next ICM scheduled for 11/30/2017.  Advised to return call for any fluid symptoms or questions.

## 2017-11-23 ENCOUNTER — Ambulatory Visit: Payer: Medicare Other

## 2017-11-23 DIAGNOSIS — I4891 Unspecified atrial fibrillation: Secondary | ICD-10-CM | POA: Diagnosis not present

## 2017-11-23 DIAGNOSIS — Z5181 Encounter for therapeutic drug level monitoring: Secondary | ICD-10-CM

## 2017-11-23 LAB — POCT INR: INR: 3

## 2017-11-23 NOTE — Patient Instructions (Signed)
Description   Continue on same dosage 1/2 tablet daily except No Coumadin on Mondays. Recheck in 3 weeks. Call our office if you are placed on any new medications or if you have any upcoming procedures [phone].

## 2017-11-30 ENCOUNTER — Telehealth: Payer: Self-pay

## 2017-11-30 ENCOUNTER — Ambulatory Visit (INDEPENDENT_AMBULATORY_CARE_PROVIDER_SITE_OTHER): Payer: Medicare Other | Admitting: *Deleted

## 2017-11-30 DIAGNOSIS — I5022 Chronic systolic (congestive) heart failure: Secondary | ICD-10-CM | POA: Diagnosis not present

## 2017-11-30 DIAGNOSIS — Z95810 Presence of automatic (implantable) cardiac defibrillator: Secondary | ICD-10-CM | POA: Diagnosis not present

## 2017-11-30 DIAGNOSIS — I428 Other cardiomyopathies: Secondary | ICD-10-CM

## 2017-11-30 NOTE — Telephone Encounter (Signed)
Remote ICM transmission received.  Attempted call to patient and left detailed message per DPR regarding transmission and next ICM scheduled for 01/01/2018.  Advised to return call for any fluid symptoms or questions.    

## 2017-11-30 NOTE — Progress Notes (Signed)
EPIC Encounter for ICM Monitoring  Patient Name: Kristi Parker is a 67 y.o. female Date: 11/30/2017 Primary Care Physican: Griffin, John, MD Primary Cardiologist:Skains Electrophysiologist: Allred Dry Weight:Previous weight125lbs (baseline 123-125 lbs) Bi-V Pacing: 98%      Clinical Status (09-Nov-2017 to 30-Nov-2017) Treated VT/VF 0 episodes  AT/AF 11 episodes  Time in AT/AF <0.1 hr/day (<0.1%)  Observations (1) (09-Nov-2017 to 30-Nov-2017)  Patient Activity less than 1 hr/day for 3 weeks.  Attempted call to patient and unable to reach.  Left detailed message regarding transmission.  Transmission reviewed.    Thoracic impedance normal.  Prescribed dosage: Furosemide 40 mg 1tablet (40 mg total) daily and may take extra 1/2 tablet for swelling. Potassium 10 mEq 2tablet daily.  Labs: 02/28/2017 Creatinine 1.60, BUN 20, Potassium 4.6, Sodium 140, EGFR 34-39 02/20/2017 Creatinine 1.85, BUN 23, Potassium 4.3, Sodium 141, EGFR 28-32 11/10/2016 Creatinine 1.75, BUN 17, Potassium 4.4, Sodium 141, EGFR 30-35 10/31/2016 Creatinine 2.49, BUN 33, Potassium 4.7, Sodium 134, EGFR 20-23 07/28/2016 Creatinine 1.64, BUN 11, Potassium 3.5, Sodium 140, EGFR 32-37 12/13/2017Creatinine 1.88, BUN 18, Potassium 3.9, Sodium 140, EGFR 27-31  Recommendations: Left voice mail with ICM number and encouraged to call if experiencing any fluid symptoms.  Follow-up plan: ICM clinic phone appointment on 01/01/2018.    Copy of ICM check sent to Dr. Allred.   3 month ICM trend: 11/30/2017    AT/AF   1 Year ICM trend:       Laurie S Short, RN 11/30/2017 10:29 AM   

## 2017-12-01 ENCOUNTER — Encounter: Payer: Self-pay | Admitting: Cardiology

## 2017-12-01 NOTE — Progress Notes (Signed)
Remote ICD transmission.   

## 2017-12-06 ENCOUNTER — Encounter: Payer: Self-pay | Admitting: Podiatry

## 2017-12-06 ENCOUNTER — Encounter: Payer: Self-pay | Admitting: Nurse Practitioner

## 2017-12-06 LAB — CUP PACEART REMOTE DEVICE CHECK
Battery Remaining Longevity: 66 mo
Battery Voltage: 2.99 V
Brady Statistic AP VP Percent: 97.98 %
Brady Statistic AP VS Percent: 1.08 %
Brady Statistic AS VP Percent: 0.89 %
Brady Statistic AS VS Percent: 0.06 %
Brady Statistic RA Percent Paced: 98.89 %
Brady Statistic RV Percent Paced: 97.96 %
Date Time Interrogation Session: 20190418062825
HighPow Impedance: 37 Ohm
Implantable Lead Implant Date: 20091104
Implantable Lead Implant Date: 20091104
Implantable Lead Implant Date: 20171214
Implantable Lead Location: 753858
Implantable Lead Location: 753859
Implantable Lead Location: 753860
Implantable Lead Model: 4196
Implantable Lead Model: 5076
Implantable Pulse Generator Implant Date: 20171214
Lead Channel Impedance Value: 1007 Ohm
Lead Channel Impedance Value: 361 Ohm
Lead Channel Impedance Value: 418 Ohm
Lead Channel Impedance Value: 475 Ohm
Lead Channel Impedance Value: 513 Ohm
Lead Channel Impedance Value: 608 Ohm
Lead Channel Pacing Threshold Amplitude: 0.5 V
Lead Channel Pacing Threshold Amplitude: 0.625 V
Lead Channel Pacing Threshold Amplitude: 2 V
Lead Channel Pacing Threshold Pulse Width: 0.4 ms
Lead Channel Pacing Threshold Pulse Width: 0.4 ms
Lead Channel Pacing Threshold Pulse Width: 0.6 ms
Lead Channel Sensing Intrinsic Amplitude: 0.125 mV
Lead Channel Sensing Intrinsic Amplitude: 0.125 mV
Lead Channel Sensing Intrinsic Amplitude: 7 mV
Lead Channel Sensing Intrinsic Amplitude: 7 mV
Lead Channel Setting Pacing Amplitude: 2 V
Lead Channel Setting Pacing Amplitude: 2.5 V
Lead Channel Setting Pacing Amplitude: 3 V
Lead Channel Setting Pacing Pulse Width: 0.4 ms
Lead Channel Setting Pacing Pulse Width: 0.6 ms
Lead Channel Setting Sensing Sensitivity: 0.3 mV

## 2017-12-07 ENCOUNTER — Telehealth: Payer: Self-pay | Admitting: Podiatry

## 2017-12-07 NOTE — Telephone Encounter (Signed)
I'm a pt of Dr. Leigh Aurora and when I was last in he trimmed my toenails back. They haven't grown very much, they've grown a little but not very much. I wanted to know if I can use that specialized nail polish that you have in your office on my toes. I've already brought some. If so, what kind of nail polish remover do I use to take the polish off? My number is [phone]. Thank you.

## 2017-12-07 NOTE — Telephone Encounter (Signed)
I spoke with pt although, the phone connection was poor. Pt states she will call me again. I emailed pt message that she could use the polish purchased in our office, and remove with OTC polish remover or purchase a polish remover from our office.

## 2017-12-07 NOTE — Telephone Encounter (Signed)
I just had a call from the nurse but I had a bad connection, so I'm calling back. Phone number is [phone].

## 2017-12-14 ENCOUNTER — Encounter: Payer: Self-pay | Admitting: Internal Medicine

## 2017-12-14 ENCOUNTER — Other Ambulatory Visit: Payer: Self-pay

## 2017-12-14 ENCOUNTER — Ambulatory Visit: Payer: Medicare Other | Admitting: Internal Medicine

## 2017-12-14 ENCOUNTER — Ambulatory Visit (HOSPITAL_COMMUNITY): Payer: Medicare Other | Attending: Cardiology

## 2017-12-14 ENCOUNTER — Ambulatory Visit (INDEPENDENT_AMBULATORY_CARE_PROVIDER_SITE_OTHER): Payer: Medicare Other | Admitting: *Deleted

## 2017-12-14 VITALS — BP 120/76 | HR 80 | Ht 63.0 in | Wt 129.0 lb

## 2017-12-14 DIAGNOSIS — Z95810 Presence of automatic (implantable) cardiac defibrillator: Secondary | ICD-10-CM

## 2017-12-14 DIAGNOSIS — I081 Rheumatic disorders of both mitral and tricuspid valves: Secondary | ICD-10-CM | POA: Insufficient documentation

## 2017-12-14 DIAGNOSIS — Z8673 Personal history of transient ischemic attack (TIA), and cerebral infarction without residual deficits: Secondary | ICD-10-CM | POA: Diagnosis not present

## 2017-12-14 DIAGNOSIS — I481 Persistent atrial fibrillation: Secondary | ICD-10-CM

## 2017-12-14 DIAGNOSIS — I472 Ventricular tachycardia, unspecified: Secondary | ICD-10-CM

## 2017-12-14 DIAGNOSIS — I428 Other cardiomyopathies: Secondary | ICD-10-CM | POA: Diagnosis not present

## 2017-12-14 DIAGNOSIS — N189 Chronic kidney disease, unspecified: Secondary | ICD-10-CM | POA: Diagnosis not present

## 2017-12-14 DIAGNOSIS — I5022 Chronic systolic (congestive) heart failure: Secondary | ICD-10-CM | POA: Diagnosis not present

## 2017-12-14 DIAGNOSIS — I270 Primary pulmonary hypertension: Secondary | ICD-10-CM | POA: Diagnosis not present

## 2017-12-14 DIAGNOSIS — I5023 Acute on chronic systolic (congestive) heart failure: Secondary | ICD-10-CM

## 2017-12-14 DIAGNOSIS — Z5181 Encounter for therapeutic drug level monitoring: Secondary | ICD-10-CM

## 2017-12-14 DIAGNOSIS — I4819 Other persistent atrial fibrillation: Secondary | ICD-10-CM

## 2017-12-14 DIAGNOSIS — Z8249 Family history of ischemic heart disease and other diseases of the circulatory system: Secondary | ICD-10-CM | POA: Diagnosis not present

## 2017-12-14 DIAGNOSIS — I4891 Unspecified atrial fibrillation: Secondary | ICD-10-CM | POA: Diagnosis not present

## 2017-12-14 LAB — ECHOCARDIOGRAM COMPLETE
Height: 63 in
Weight: 2064 oz

## 2017-12-14 LAB — POCT INR: INR: 4.5

## 2017-12-14 MED ORDER — AMIODARONE HCL 200 MG PO TABS: 100.0000 mg | ORAL_TABLET | Freq: Every day | ORAL | 3 refills | Status: DC

## 2017-12-14 NOTE — Patient Instructions (Signed)
Description   Do not take any Coumadin today and No Coumadin tomorrow then continue on same dosage 1/2 tablet daily except No Coumadin on Mondays. Recheck in 2 weeks. Call our office if you are placed on any new medications or if you have any upcoming procedures [phone].

## 2017-12-14 NOTE — Patient Instructions (Addendum)
Medication Instructions:  Your physician has recommended you make the following change in your medication:  1.  Reduce your amiodarone to 100 mg (1/2 tablet) by mouth daily.  Labwork: You will get blood work today:  CMET, CBC, TSH  Testing/Procedures: Your physician has requested that you have an echocardiogram. Echocardiography is a painless test that uses sound waves to create images of your heart. It provides your doctor with information about the size and shape of your heart and how well your heart's chambers and valves are working. This procedure takes approximately one hour. There are no restrictions for this procedure.  Schedule for ECHO.  Your physician has recommended that you have a cardiopulmonary stress test (CPX). CPX testing is a non-invasive measurement of heart and lung function. It replaces a traditional treadmill stress test. This type of test provides a tremendous amount of information that relates not only to your present condition but also for future outcomes. This test combines measurements of you ventilation, respiratory gas exchange in the lungs, electrocardiogram (EKG), blood pressure and physical response before, during, and following an exercise protocol.  Please schedule for a CPX  Follow-Up: Your physician wants you to follow-up in: after stress test with Dr. Rayann Heman.     Remote monitoring is used to monitor your ICD from home. This monitoring reduces the number of office visits required to check your device to one time per year. It allows [place] to keep an eye on the functioning of your device to ensure it is working properly. You are scheduled for a device check from home on 01/01/2018. You may send your transmission at any time that day. If you have a wireless device, the transmission will be sent automatically. After your physician reviews your transmission, you will receive a postcard with your next transmission date.  Any Other Special Instructions Will Be Listed  Below (If Applicable).  If you need a refill on your cardiac medications before your next appointment, please call your pharmacy.

## 2017-12-14 NOTE — Progress Notes (Signed)
PCP: [name], MD Primary Cardiologist:  Dr Marlou Porch Primary EP: Dr Rayann Heman  Kristi Parker is a 67 y.o. female who presents today for routine electrophysiology followup.  Since last being seen in our clinic, the patient reports doing reasonably well. She is added on to my clinic urgently due to worsening exertional fatigue. She underwent CRT optimization 8/18.  She reports feeling "better" afterwards.  Unfortunately, she has had clinical decline over the past few months.  She reports significant fatigue and exertional "heaviness" of her body.  + occasional SOB.   Today, she denies symptoms of palpitations, chest pain, lower extremity edema, dizziness, presyncope, syncope, or ICD shocks.  The patient is otherwise without complaint today.   Past Medical History:  Diagnosis Date  . Adenomatous colon polyp   . Cervical radiculopathy at C5   . Chronic systolic heart failure ([place])   . CKD (chronic kidney disease), stage III ([place])   . DDD (degenerative disc disease), cervical   . DDD (degenerative disc disease), lumbar   . Fibroid   . GERD (gastroesophageal reflux disease)   . Hearing loss 02/2010   L hearing loss/vertigo, steroids  . History of seizure disorder    Related Hx  . Mitral regurgitation    moderate  . Multiple sclerosis ([place]) 1984  . Nonischemic cardiomyopathy ([facility])    Moderate LVEF 35-40% by ECHO 2011, 25-30% by echo 2013  . Permanent atrial fibrillation ([place])   . Pulmonary hypertension, secondary 06/04/2013   As a result of nonischemic cardiomyopathy EF 25-30%  . Stroke [facility])    Right brain CVA, complete recovery 07/2003  . Ventricular tachycardia ([place])   . Vertigo 02/2010   L hearing loss/vertigo, steroids   Past Surgical History:  Procedure Laterality Date  . BIV PACEMAKER GENERATOR CHANGE OUT N/A 09/18/2014   Procedure: BIV PACEMAKER GENERATOR CHANGE OUT;  Surgeon: Thompson Grayer, MD;  Location: [facility] CATH LAB;  Service: Cardiovascular;  Laterality: N/A;  . DILATION  AND CURETTAGE OF UTERUS    . EP IMPLANTABLE DEVICE N/A 07/28/2016   Procedure: ICD Implant;  Surgeon: Evans Lance, MD;  Location: [facility] CV LAB;  Service: Cardiovascular;  Laterality: N/A;  . HYSTEROSCOPY    . PACEMAKER INSERTION     PTVP 08/2001 for complete heart block. Upgrade PTVP to MDT BiV 06/2008 by Dr Leonia Reeves  . TUBAL LIGATION      ROS- all systems are reviewed and negative except as per HPI above  Current Outpatient Medications  Medication Sig Dispense Refill  . acetaminophen (TYLENOL) 500 MG tablet Take 500-1,000 mg by mouth every 6 (six) hours as needed (for back pain).    . ALPHAGAN P 0.1 % SOLN Place 1 drop into the left eye 2 (two) times daily.    [name] amiodarone (PACERONE) 200 MG tablet TAKE 1 TABLET BY MOUTH  DAILY 90 tablet 3  . BIOTIN PO Take 1 capsule by mouth daily.    . Calcium Carbonate-Vitamin D (CALCIUM + D PO) Take 1 tablet by mouth daily.     . carvedilol (COREG) 3.125 MG tablet TAKE 1 TABLET BY MOUTH TWICE A DAY 180 tablet 3  . COUMADIN 5 MG tablet TAKE AS DIRECTED 30 tablet 2  . fexofenadine (ALLEGRA) 180 MG tablet Take 180 mg by mouth daily as needed for allergies.     . furosemide (LASIX) 40 MG tablet Take 40 mg by mouth daily. May take an extra 1/2 tablet by mouth as directed for swelling    .  levothyroxine (SYNTHROID, LEVOTHROID) 50 MCG tablet Take 50 mcg by mouth daily.    [name] lisinopril (PRINIVIL,ZESTRIL) 2.5 MG tablet TAKE 1 TABLET BY MOUTH  DAILY 90 tablet 2  . loratadine (CLARITIN) 10 MG tablet Take 10 mg by mouth daily as needed for allergies.    [name] MAGNESIUM PO Take 1 tablet by mouth daily.    Salley Scarlet FORMULARY [facility]  Onychomycosis Nail Lacquer -  Fluconazole 2%, Terbinafine 1% DMSO Apply to affected nail once daily Qty. 120 gm 3 refills    . NONFORMULARY OR COMPOUNDED ITEM [facility]:  Onychomycosis Nail Lacquer - Fluconazole 2%, Terbinafine 1%, DMSO apply to affected area daily 120 each 3  . potassium chloride (K-DUR) 10 MEQ  tablet TAKE TWO (2) TABLETS BY MOUTH DAILY 180 tablet 2   No current facility-administered medications for this visit.     Physical Exam: Vitals:   12/14/17 1103  BP: 120/76  Pulse: 80  Weight: 129 lb (58.5 kg)  Height: 5\' 3"  (1.6 m)    GEN- The patient is well appearing, alert and oriented x 3 today.   Head- normocephalic, atraumatic Eyes-  Sclera clear, conjunctiva pink Ears- hearing intact Oropharynx- clear Lungs- Clear to ausculation bilaterally, normal work of breathing Chest- ICD pocket is well healed Heart- Regular rate and rhythm, no murmurs, rubs or gallops, PMI not laterally displaced GI- soft, NT, ND, + BS Extremities- no clubbing, cyanosis, or edema  ICD interrogation- reviewed in detail today,  See PACEART report  ekg tracing ordered today is personally reviewed and shows AV paced  Assessment and Plan:  1.  Acute on chronic systolic dysfunction I worry that her exertional fatigue may be due to worsening of her CHF. Normal BiV ICD function I have increased upper tracking rate from 110 to120 bpm (which required increasing VT zone from 120 to 130 bpm) See Pace Art report Will update echo (not performed since CRT optimization) and also obtain CPX to further evaluate her CHF. Medical therapy appears to be limited by hypotension. Could consider entresto, however I am not sure that her BP will allow.  I will defer to Dr Marlou Porch. Bmet, tsh, and cbc to further evaluate her fatigue today  2. Ventricular tachycardia No recent episodes Reduce amiodarone to 100mg  daily given above fatigue.  Check lfts, tfts today Normal ICD function as above  3. persistent afib Very difficult to tell if her underlying rhythm is fine afib or atrial standstill. Appears similar to prior ekgs No changes chads2vascs score is at least 5.  Continue coumadin  Return to see me in 4 weeks  Thompson Grayer MD, [facility] 12/14/2017 1:54 PM

## 2017-12-15 ENCOUNTER — Telehealth: Payer: Self-pay

## 2017-12-15 LAB — COMPREHENSIVE METABOLIC PANEL
ALT: 28 IU/L (ref 0–32)
AST: 26 IU/L (ref 0–40)
Albumin/Globulin Ratio: 1.6 (ref 1.2–2.2)
Albumin: 4.3 g/dL (ref 3.6–4.8)
Alkaline Phosphatase: 130 IU/L — ABNORMAL HIGH (ref 39–117)
BUN/Creatinine Ratio: 9 — ABNORMAL LOW (ref 12–28)
BUN: 14 mg/dL (ref 8–27)
Bilirubin Total: 1.6 mg/dL — ABNORMAL HIGH (ref 0.0–1.2)
CO2: 22 mmol/L (ref 20–29)
Calcium: 9.5 mg/dL (ref 8.7–10.3)
Chloride: 103 mmol/L (ref 96–106)
Creatinine, Ser: 1.59 mg/dL — ABNORMAL HIGH (ref 0.57–1.00)
GFR calc Af Amer: 39 mL/min/{1.73_m2} — ABNORMAL LOW (ref >59)
GFR calc non Af Amer: 34 mL/min/{1.73_m2} — ABNORMAL LOW (ref >59)
Globulin, Total: 2.7 g/dL (ref 1.5–4.5)
Glucose: 89 mg/dL (ref 65–99)
Potassium: 4 mmol/L (ref 3.5–5.2)
Sodium: 142 mmol/L (ref 134–144)
Total Protein: 7 g/dL (ref 6.0–8.5)

## 2017-12-15 LAB — CBC WITH DIFFERENTIAL/PLATELET
Basophils Absolute: 0 10*3/uL (ref 0.0–0.2)
Basos: 0 %
EOS (ABSOLUTE): 0 10*3/uL (ref 0.0–0.4)
Eos: 1 %
Hematocrit: 34.9 % (ref 34.0–46.6)
Hemoglobin: 10.9 g/dL — ABNORMAL LOW (ref 11.1–15.9)
Immature Grans (Abs): 0 10*3/uL (ref 0.0–0.1)
Immature Granulocytes: 0 %
Lymphocytes Absolute: 0.9 10*3/uL (ref 0.7–3.1)
Lymphs: 20 %
MCH: 29.7 pg (ref 26.6–33.0)
MCHC: 31.2 g/dL — ABNORMAL LOW (ref 31.5–35.7)
MCV: 95 fL (ref 79–97)
Monocytes Absolute: 0.6 10*3/uL (ref 0.1–0.9)
Monocytes: 12 %
Neutrophils Absolute: 3 10*3/uL (ref 1.4–7.0)
Neutrophils: 67 %
Platelets: 156 10*3/uL (ref 150–379)
RBC: 3.67 x10E6/uL — ABNORMAL LOW (ref 3.77–5.28)
RDW: 15.2 % (ref 12.3–15.4)
WBC: 4.5 10*3/uL (ref 3.4–10.8)

## 2017-12-15 LAB — TSH: TSH: 1.48 u[IU]/mL (ref 0.450–4.500)

## 2017-12-15 NOTE — Telephone Encounter (Signed)
Return call received from Pt.   Discussed findings of the ECHO. Advised at this time, continue to plan for CPX and follow up will be based off those results. Pt indicates understanding.  Will cont to closely monitor.

## 2017-12-15 NOTE — Telephone Encounter (Signed)
-----   Message from Thompson Grayer, MD sent at 12/14/2017 10:14 PM EDT ----- Results reviewed.  [name], please inform pt of result.  Will see what CPX reveals and then decide with Dr Marlou Porch if she should be referred to advanced CHF team.  Pulmonary hypertension is worrisome. I will route to primary care and Dr Marlou Porch also.

## 2017-12-20 ENCOUNTER — Other Ambulatory Visit: Payer: Self-pay | Admitting: Cardiology

## 2017-12-27 ENCOUNTER — Other Ambulatory Visit (HOSPITAL_COMMUNITY): Payer: Self-pay | Admitting: *Deleted

## 2017-12-27 ENCOUNTER — Ambulatory Visit (HOSPITAL_COMMUNITY): Payer: Medicare Other | Attending: Internal Medicine

## 2017-12-27 DIAGNOSIS — I5022 Chronic systolic (congestive) heart failure: Secondary | ICD-10-CM | POA: Diagnosis not present

## 2017-12-27 DIAGNOSIS — I509 Heart failure, unspecified: Secondary | ICD-10-CM | POA: Diagnosis present

## 2017-12-28 ENCOUNTER — Ambulatory Visit: Payer: Medicare Other | Admitting: *Deleted

## 2017-12-28 DIAGNOSIS — Z5181 Encounter for therapeutic drug level monitoring: Secondary | ICD-10-CM | POA: Diagnosis not present

## 2017-12-28 DIAGNOSIS — I4891 Unspecified atrial fibrillation: Secondary | ICD-10-CM | POA: Diagnosis not present

## 2017-12-28 LAB — POCT INR: INR: 3.1

## 2017-12-28 NOTE — Patient Instructions (Signed)
Description   Have some dark green leafy veggies today and continue on same dosage 1/2 tablet daily except No Coumadin on Mondays. Recheck in 2 weeks. Call our office if you are placed on any new medications or if you have any upcoming procedures [phone].

## 2017-12-29 ENCOUNTER — Other Ambulatory Visit: Payer: Self-pay | Admitting: *Deleted

## 2017-12-29 DIAGNOSIS — I5022 Chronic systolic (congestive) heart failure: Secondary | ICD-10-CM

## 2018-01-01 ENCOUNTER — Ambulatory Visit (INDEPENDENT_AMBULATORY_CARE_PROVIDER_SITE_OTHER): Payer: Self-pay

## 2018-01-01 DIAGNOSIS — I5022 Chronic systolic (congestive) heart failure: Secondary | ICD-10-CM

## 2018-01-01 DIAGNOSIS — Z95810 Presence of automatic (implantable) cardiac defibrillator: Secondary | ICD-10-CM

## 2018-01-01 NOTE — Progress Notes (Signed)
EPIC Encounter for ICM Monitoring  Patient Name: Kristi Parker is a 67 y.o. female Date: 01/01/2018 Primary Care Physican: [name], MD Primary Cardiologist:Skains Electrophysiologist: Allred Dry Weight:125lbs (baseline 123-125 lbs) Bi-V Pacing: 98.6%       Heart Failure questions reviewed, pt asymptomatic now but feeling better. She did have weight gain of 2-3 pounds and feet swelling during decreased impedance but resolved by taking extra 1/2 tablet Furosemide.   Thoracic impedance normal.  Prescribed dosage: Furosemide 40 mg Take 40 mg by mouth daily. May take an extra 1/2 tablet by mouth as directed for swelling  Labs: 12/14/2017 Creatinine 1.59, BUN 14, Potassium 4.0, Sodium 142, EGFR 34-39 02/28/2017 Creatinine 1.60, BUN 20, Potassium 4.6, Sodium 140, EGFR 34-39 02/20/2017 Creatinine 1.85, BUN 23, Potassium 4.3, Sodium 141, EGFR 28-32 11/10/2016 Creatinine 1.75, BUN 17, Potassium 4.4, Sodium 141, EGFR 30-35 10/31/2016 Creatinine 2.49, BUN 33, Potassium 4.7, Sodium 134, EGFR 20-23 07/28/2016 Creatinine 1.64, BUN 11, Potassium 3.5, Sodium 140, EGFR 32-37 12/13/2017Creatinine 1.88, BUN 18, Potassium 3.9, Sodium 140, EGFR 27-31  Recommendations: No changes.  Reinforced fluid restriction to < 2 L daily and sodium restriction to less than 2000 mg daily.  Encouraged to call for fluid symptoms.  Follow-up plan: ICM clinic phone appointment on 02/05/2018.  Office appointment scheduled 01/03/2018 with Dr. Rayann Heman.  Copy of ICM check sent to Dr. Rayann Heman.   3 month ICM trend: 01/01/2018    1 Year ICM trend:       [name], RN 01/01/2018 4:51 PM

## 2018-01-02 ENCOUNTER — Ambulatory Visit: Payer: Medicare Other | Admitting: Podiatry

## 2018-01-02 ENCOUNTER — Encounter: Payer: Self-pay | Admitting: Podiatry

## 2018-01-02 DIAGNOSIS — B351 Tinea unguium: Secondary | ICD-10-CM | POA: Diagnosis not present

## 2018-01-02 DIAGNOSIS — M79674 Pain in right toe(s): Secondary | ICD-10-CM | POA: Diagnosis not present

## 2018-01-02 DIAGNOSIS — D689 Coagulation defect, unspecified: Secondary | ICD-10-CM | POA: Diagnosis not present

## 2018-01-02 DIAGNOSIS — M79675 Pain in left toe(s): Secondary | ICD-10-CM

## 2018-01-03 ENCOUNTER — Encounter: Payer: Self-pay | Admitting: Internal Medicine

## 2018-01-03 ENCOUNTER — Ambulatory Visit: Payer: Medicare Other | Admitting: Internal Medicine

## 2018-01-03 VITALS — BP 110/56 | HR 82 | Ht 63.0 in | Wt 126.0 lb

## 2018-01-03 DIAGNOSIS — I428 Other cardiomyopathies: Secondary | ICD-10-CM | POA: Diagnosis not present

## 2018-01-03 DIAGNOSIS — I5022 Chronic systolic (congestive) heart failure: Secondary | ICD-10-CM | POA: Diagnosis not present

## 2018-01-03 DIAGNOSIS — I481 Persistent atrial fibrillation: Secondary | ICD-10-CM

## 2018-01-03 DIAGNOSIS — I4819 Other persistent atrial fibrillation: Secondary | ICD-10-CM

## 2018-01-03 DIAGNOSIS — I472 Ventricular tachycardia, unspecified: Secondary | ICD-10-CM

## 2018-01-03 DIAGNOSIS — Z95810 Presence of automatic (implantable) cardiac defibrillator: Secondary | ICD-10-CM | POA: Diagnosis not present

## 2018-01-03 LAB — CUP PACEART INCLINIC DEVICE CHECK
Battery Remaining Longevity: 63 mo
Battery Voltage: 2.98 V
Brady Statistic AP VP Percent: 98.85 %
Brady Statistic AP VS Percent: 0.66 %
Brady Statistic AS VP Percent: 0.47 %
Brady Statistic AS VS Percent: 0.02 %
Brady Statistic RA Percent Paced: 99.38 %
Brady Statistic RV Percent Paced: 98.61 %
Date Time Interrogation Session: 20190522095643
HighPow Impedance: 39 Ohm
Implantable Lead Implant Date: 20091104
Implantable Lead Implant Date: 20091104
Implantable Lead Implant Date: 20171214
Implantable Lead Location: 753858
Implantable Lead Location: 753859
Implantable Lead Location: 753860
Implantable Lead Model: 4196
Implantable Lead Model: 5076
Implantable Pulse Generator Implant Date: 20171214
Lead Channel Impedance Value: 361 Ohm
Lead Channel Impedance Value: 399 Ohm
Lead Channel Impedance Value: 475 Ohm
Lead Channel Impedance Value: 475 Ohm
Lead Channel Impedance Value: 589 Ohm
Lead Channel Impedance Value: 950 Ohm
Lead Channel Pacing Threshold Amplitude: 0.5 V
Lead Channel Pacing Threshold Amplitude: 0.625 V
Lead Channel Pacing Threshold Amplitude: 2.25 V
Lead Channel Pacing Threshold Pulse Width: 0.4 ms
Lead Channel Pacing Threshold Pulse Width: 0.4 ms
Lead Channel Pacing Threshold Pulse Width: 0.6 ms
Lead Channel Sensing Intrinsic Amplitude: 0.125 mV
Lead Channel Sensing Intrinsic Amplitude: 0.125 mV
Lead Channel Sensing Intrinsic Amplitude: 6.875 mV
Lead Channel Sensing Intrinsic Amplitude: 6.875 mV
Lead Channel Setting Pacing Amplitude: 2 V
Lead Channel Setting Pacing Amplitude: 2.5 V
Lead Channel Setting Pacing Amplitude: 3.25 V
Lead Channel Setting Pacing Pulse Width: 0.4 ms
Lead Channel Setting Pacing Pulse Width: 0.6 ms
Lead Channel Setting Sensing Sensitivity: 0.3 mV

## 2018-01-03 MED ORDER — SACUBITRIL-VALSARTAN 24-26 MG PO TABS: 1.0000 | ORAL_TABLET | Freq: Two times a day (BID) | ORAL | 11 refills | Status: DC

## 2018-01-03 NOTE — Patient Instructions (Addendum)
Medication Instructions:  Your physician has recommended you make the following change in your medication:  1.  Stop taking lisinopril. 2.  Wait 36 hours before you start Entresto.   3.  Start taking Entresto 24-26 mg tablets - one tablet by mouth twice a day.  START Jan 05 2018 am.  Labwork: None ordered.  Testing/Procedures:  Follow up with pharmacy for new start Entresto in 2 weeks.   Follow-Up: Your physician wants you to follow-up in: 3 months with Chanetta Marshall, NP.     Remote monitoring is used to monitor your ICD from home. This monitoring reduces the number of office visits required to check your device to one time per year. It allows [place] to keep an eye on the functioning of your device to ensure it is working properly. You are scheduled for a device check from home on 02/05/2018. You may send your transmission at any time that day. If you have a wireless device, the transmission will be sent automatically. After your physician reviews your transmission, you will receive a postcard with your next transmission date.  Any Other Special Instructions Will Be Listed Below (If Applicable).  A referral has been sent to Heart Failure clinic-they should be calling you soon to schedule an appointment.  If you need a refill on your cardiac medications before your next appointment, please call your pharmacy.   Sacubitril; Valsartan oral tablet What is this medicine? SACUBITRIL; VALSARTAN (sak UE bi tril; val SAR tan) is a combination of 2 drugs used to reduce the risk of death and hospitalizations in people with long-lasting heart failure. It is usually used with other medicines to treat heart failure. This medicine may be used for other purposes; ask your health care provider or pharmacist if you have questions. COMMON BRAND NAME(S): Entresto What should I tell my health care provider before I take this medicine? They need to know if you have any of these conditions: -diabetes and take a  medicine that contains aliskiren -kidney disease -liver disease -an unusual or allergic reaction to sacubitril; valsartan, drugs called angiotensin converting enzyme (ACE) inhibitors, angiotensin II receptor blockers (ARBs), other medicines, foods, dyes, or preservatives -pregnant or trying to get pregnant -breast-feeding How should I use this medicine? Take this medicine by mouth with a glass of water. Follow the directions on the prescription label. You can take it with or without food. If it upsets your stomach, take it with food. Take your medicine at regular intervals. Do not take it more often than directed. Do not stop taking except on your doctor's advice. Do not take this medicine for at least 36 hours before or after you take an ACE inhibitor medicine. Talk to your health care provider if you are not sure if you take an ACE inhibitor. Talk to your pediatrician regarding the use of this medicine in children. Special care may be needed. Overdosage: If you think you have taken too much of this medicine contact a poison control center or emergency room at once. NOTE: This medicine is only for you. Do not share this medicine with others. What if I miss a dose? If you miss a dose, take it as soon as you can. If it is almost time for next dose, take only that dose. Do not take double or extra doses. What may interact with this medicine? Do not take this medicine with any of the following medicines: -aliskiren if you have diabetes -angiotensin-converting enzyme (ACE) inhibitors, like benazepril, captopril, enalapril, fosinopril,  lisinopril, or ramipril This medicine may also interact with the following medicines: -angiotensin II receptor blockers (ARBs) like azilsartan, candesartan, eprosartan, irbesartan, losartan, olmesartan, telmisartan, or valsartan -lithium -NSAIDS, medicines for pain and inflammation, like ibuprofen or naproxen -potassium-sparing diuretics like amiloride,  spironolactone, and triamterene -potassium supplements This list may not describe all possible interactions. Give your health care provider a list of all the medicines, herbs, non-prescription drugs, or dietary supplements you use. Also tell them if you smoke, drink alcohol, or use illegal drugs. Some items may interact with your medicine. What should I watch for while using this medicine? Tell your doctor or healthcare professional if your symptoms do not start to get better or if they get worse. Do not become pregnant while taking this medicine. Women should inform their doctor if they wish to become pregnant or think they might be pregnant. There is a potential for serious side effects to an unborn child. Talk to your health care professional or pharmacist for more information. You may get dizzy. Do not drive, use machinery, or do anything that needs mental alertness until you know how this medicine affects you. Do not stand or sit up quickly, especially if you are an older patient. This reduces the risk of dizzy or fainting spells. Avoid alcoholic drinks; they can make you more dizzy. What side effects may I notice from receiving this medicine? Side effects that you should report to your doctor or health care professional as soon as possible: -allergic reactions like skin rash, itching or hives, swelling of the face, lips, or tongue -signs and symptoms of increased potassium like muscle weakness; chest pain; or fast, irregular heartbeat -signs and symptoms of kidney injury like trouble passing urine or change in the amount of urine -signs and symptoms of low blood pressure like feeling dizzy or lightheaded, or if you develop extreme fatigue Side effects that usually do not require medical attention (report to your doctor or health care professional if they continue or are bothersome): -cough This list may not describe all possible side effects. Call your doctor for medical advice about side  effects. You may report side effects to FDA at 1-800-FDA-1088. Where should I keep my medicine? Keep out of the reach of children. Store at room temperature between 15 and 30 degrees C (59 and 86 degrees F). Throw away any unused medicine after the expiration date. NOTE: This sheet is a summary. It may not cover all possible information. If you have questions about this medicine, talk to your doctor, pharmacist, or health care provider.  2018 Elsevier/Gold Standard (2015-09-16 13:54:19)

## 2018-01-03 NOTE — Progress Notes (Signed)
Subjective: 67 y.o. returns the office today for painful, elongated, thickened toenails which she cannot trim herself and she has been using a topical antifungal which is been helping her nails some.  Denies any surrounding redness or drainage.  Her nails get sensitive when getting elongated.  She has no new concerns today.  PCP: [name], MD  She is on coumadin  Objective: AAO 3, NAD DP pulses 2/4, PT 1/4, CRT less than 3 seconds- unchanged and denies any claudication symptoms Nails hypertrophic, dystrophic, elongated, brittle, discolored 10.  There is tenderness overlying the nails 1-5 bilaterally. There is no surrounding erythema or drainage along the nail sites.  Overall appears that the color is getting somewhat better to the toenails. No open lesions or pre-ulcerative lesions are identified. No other areas of tenderness bilateral lower extremities. No overlying edema, erythema, increased warmth. No pain with calf compression, swelling, warmth, erythema.  Assessment: Patient presents with symptomatic onychomycosis  Plan: -Treatment options including alternatives, risks, complications were discussed -Nails sharply debrided 10 without complication/bleeding. -Continue topical antifungal. -Discussed daily foot inspection. If there are any changes, to call the office immediately.  -Follow-up in 3 months if she wants for nail debridement or sooner if any problems are to arise. In the meantime, encouraged to call the office with any questions, concerns, changes symptoms.  Celesta Gentile, DPM

## 2018-01-03 NOTE — Progress Notes (Signed)
PCP: [name], MD Primary Cardiologist: Dr Marlou Porch Primary EP: Dr Rayann Heman  Kristi Parker is a 67 y.o. female who presents today for routine electrophysiology followup.  Since last being seen in our clinic, the patient reports doing reasonably well.  She struggled with poor exercise tolerance recently when in [place].  Over the past few weeks she feels "much better".  + SOB with moderate activity.  Today, she denies symptoms of palpitations, chest pain, lower extremity edema, dizziness, presyncope, syncope, or ICD shocks.  The patient is otherwise without complaint today.   Past Medical History:  Diagnosis Date  . Adenomatous colon polyp   . Cervical radiculopathy at C5   . Chronic systolic heart failure ([place])   . CKD (chronic kidney disease), stage III ([place])   . DDD (degenerative disc disease), cervical   . DDD (degenerative disc disease), lumbar   . Fibroid   . GERD (gastroesophageal reflux disease)   . Hearing loss 02/2010   L hearing loss/vertigo, steroids  . History of seizure disorder    Related Hx  . Mitral regurgitation    moderate  . Multiple sclerosis ([place]) 1984  . Nonischemic cardiomyopathy (H[facility])    Moderate LVEF 35-40% by ECHO 2011, 25-30% by echo 2013  . Permanent atrial fibrillation ([place])   . Pulmonary hypertension, secondary 06/04/2013   As a result of nonischemic cardiomyopathy EF 25-30%  . Stroke [facility])    Right brain CVA, complete recovery 07/2003  . Ventricular tachycardia ([place])   . Vertigo 02/2010   L hearing loss/vertigo, steroids   Past Surgical History:  Procedure Laterality Date  . BIV PACEMAKER GENERATOR CHANGE OUT N/A 09/18/2014   Procedure: BIV PACEMAKER GENERATOR CHANGE OUT;  Surgeon: Thompson Grayer, MD;  Location: [facility] CATH LAB;  Service: Cardiovascular;  Laterality: N/A;  . DILATION AND CURETTAGE OF UTERUS    . EP IMPLANTABLE DEVICE N/A 07/28/2016   Procedure: ICD Implant;  Surgeon: Evans Lance, MD;  Location: [facility] CV LAB;  Service:  Cardiovascular;  Laterality: N/A;  . HYSTEROSCOPY    . PACEMAKER INSERTION     PTVP 08/2001 for complete heart block. Upgrade PTVP to MDT BiV 06/2008 by Dr Leonia Reeves  . TUBAL LIGATION      ROS- all systems are reviewed and negative except as per HPI above  Current Outpatient Medications  Medication Sig Dispense Refill  . acetaminophen (TYLENOL) 500 MG tablet Take 500-1,000 mg by mouth every 6 (six) hours as needed (for back pain).    . ALPHAGAN P 0.1 % SOLN Place 1 drop into the left eye 2 (two) times daily.    [name] amiodarone (PACERONE) 200 MG tablet Take 0.5 tablets (100 mg total) by mouth daily. 45 tablet 3  . BIOTIN PO Take 1 capsule by mouth daily.    . Calcium Carbonate-Vitamin D (CALCIUM + D PO) Take 1 tablet by mouth daily.     . carvedilol (COREG) 3.125 MG tablet TAKE 1 TABLET BY MOUTH TWICE A DAY 180 tablet 3  . fexofenadine (ALLEGRA) 180 MG tablet Take 180 mg by mouth daily as needed for allergies.     . furosemide (LASIX) 40 MG tablet Take 40 mg by mouth daily. May take an extra 1/2 tablet by mouth as directed for swelling    . levothyroxine (SYNTHROID, LEVOTHROID) 50 MCG tablet Take 50 mcg by mouth daily.    [name] lisinopril (PRINIVIL,ZESTRIL) 2.5 MG tablet TAKE 1 TABLET BY MOUTH  DAILY 90 tablet 2  .  loratadine (CLARITIN) 10 MG tablet Take 10 mg by mouth daily as needed for allergies.    [name] MAGNESIUM PO Take 1 tablet by mouth daily.    Salley Scarlet FORMUL[facility]  Onychomycosis Nail Lacquer -  Fluconazole 2%, Terbinafine 1% DMSO Apply to affected nail once daily Qty. 120 gm 3 refills    . NON FORMULARY [facility]  Onychomycosis Nail Lacquer -  Fluconazole 2%, Terbinafine 1% DMSO Apply to affected nail once daily Qty. 120 gm 3 refills    . NONFORMULARY OR COMPOUNDED ITEM [facility]:  Onychomycosis Nail Lacquer - Fluconazole 2%, Terbinafine 1%, DMSO apply to affected area daily 120 each 3  . potassium chloride (K-DUR) 10 MEQ tablet TAKE TWO (2) TABLETS BY MOUTH  DAILY 180 tablet 2  . warfarin (COUMADIN) 5 MG tablet Take as directed by coumadin clinic 20 tablet 3   No current facility-administered medications for this visit.     Physical Exam: Vitals:   01/03/18 0858  BP: (!) 110/56  Pulse: 82  Weight: 126 lb (57.2 kg)  Height: 5\' 3"  (1.6 m)    GEN- The patient is well appearing, alert and oriented x 3 today.   Head- normocephalic, atraumatic Eyes-  Sclera clear, conjunctiva pink Ears- hearing intact Oropharynx- clear Lungs- Clear to ausculation bilaterally, normal work of breathing Chest- ICD pocket is well healed Heart- Regular rate and rhythm, no murmurs, rubs or gallops, PMI not laterally displaced GI- soft, NT, ND, + BS Extremities- no clubbing, cyanosis, or edema  ICD interrogation- reviewed in detail today,  See PACEART report  ekg tracing ordered today is personally reviewed and shows AV paced rhythm  Wt Readings from Last 3 Encounters:  01/03/18 126 lb (57.2 kg)  12/14/17 129 lb (58.5 kg)  06/06/17 135 lb 9.6 oz (61.5 kg)    Assessment and Plan:  1.  Chronic systolic dysfunction euvolemic today Stable on an appropriate medical regimen Normal BiV ICD function See [facility] report No changes today followed in ICM device clinic Echo and CPX reviewed at length with her today.  Will refer to advanced heart failure team for consideration of advanced therapies. Stop lisinopril and start entresto after 36 hour washout.  Will need to have entresto titrated by CHF clinic when she establishes.  2. ventricular tachycardia Well controlled with amiodarone 100mg  daily  3. Persistent afib On coumadin chads2vasc score is 5  Return to see EP NP in 3 months for further device optimization Follow-up with Dr Marlou Porch as scheduled  Thompson Grayer MD, [facility] 01/03/2018 9:15 AM

## 2018-01-05 ENCOUNTER — Encounter

## 2018-01-05 ENCOUNTER — Encounter: Payer: Medicare Other | Admitting: Internal Medicine

## 2018-01-10 ENCOUNTER — Telehealth (HOSPITAL_COMMUNITY): Payer: Self-pay | Admitting: Internal Medicine

## 2018-01-10 ENCOUNTER — Telehealth: Payer: Self-pay | Admitting: Internal Medicine

## 2018-01-10 ENCOUNTER — Encounter: Payer: Self-pay | Admitting: Internal Medicine

## 2018-01-10 NOTE — Telephone Encounter (Signed)
Called patient and left message to call back.  Pt needs to be scheduled for New CHF with Dr. Haroldine Laws.

## 2018-01-10 NOTE — Telephone Encounter (Signed)
New Message   Pt c/o medication issue:  1. Name of Medication: Entresto  2. How are you currently taking this medication (dosage and times per day)? 24-26 mg 1 tablet by mouth two times a day  3. Are you having a reaction (difficulty breathing--STAT)?  4. What is your medication issue? Patient has an appointment on 06/06 to follow up on Entresto. She has to go to [facility] for a family emergency and is not sure if she has to keep this appointment or if she can put it off for a few days. She indicates that she is tolerating the Entresto pretty well.

## 2018-01-11 ENCOUNTER — Ambulatory Visit: Payer: Medicare Other | Admitting: *Deleted

## 2018-01-11 DIAGNOSIS — Z5181 Encounter for therapeutic drug level monitoring: Secondary | ICD-10-CM

## 2018-01-11 DIAGNOSIS — I4891 Unspecified atrial fibrillation: Secondary | ICD-10-CM

## 2018-01-11 LAB — POCT INR: INR: 3.4 — AB (ref 2.0–3.0)

## 2018-01-11 NOTE — Patient Instructions (Signed)
Description   Skip today's dose, then change your dose to  1/2 tablet daily except No Coumadin on Mondays and Fridays.  Recheck in 2 weeks. Call our office if you are placed on any new medications or if you have any upcoming procedures [phone].

## 2018-01-12 ENCOUNTER — Encounter (HOSPITAL_COMMUNITY): Payer: Self-pay

## 2018-01-12 ENCOUNTER — Encounter (HOSPITAL_COMMUNITY): Payer: Self-pay | Admitting: Internal Medicine

## 2018-01-12 ENCOUNTER — Other Ambulatory Visit (HOSPITAL_COMMUNITY): Payer: Self-pay

## 2018-01-12 ENCOUNTER — Ambulatory Visit (HOSPITAL_COMMUNITY)
Admission: RE | Admit: 2018-01-12 | Discharge: 2018-01-12 | Disposition: A | Discharge status: Home / Self Care | Payer: Medicare Other | Source: Ambulatory Visit | Attending: Internal Medicine | Admitting: Internal Medicine

## 2018-01-12 ENCOUNTER — Other Ambulatory Visit: Payer: Self-pay

## 2018-01-12 VITALS — BP 101/53 | HR 82 | Wt 125.5 lb

## 2018-01-12 DIAGNOSIS — I272 Pulmonary hypertension, unspecified: Secondary | ICD-10-CM | POA: Diagnosis not present

## 2018-01-12 DIAGNOSIS — Z79899 Other long term (current) drug therapy: Secondary | ICD-10-CM | POA: Insufficient documentation

## 2018-01-12 DIAGNOSIS — I480 Paroxysmal atrial fibrillation: Secondary | ICD-10-CM | POA: Insufficient documentation

## 2018-01-12 DIAGNOSIS — I429 Cardiomyopathy, unspecified: Secondary | ICD-10-CM | POA: Insufficient documentation

## 2018-01-12 DIAGNOSIS — Z859 Personal history of malignant neoplasm, unspecified: Secondary | ICD-10-CM | POA: Diagnosis not present

## 2018-01-12 DIAGNOSIS — I5022 Chronic systolic (congestive) heart failure: Secondary | ICD-10-CM

## 2018-01-12 DIAGNOSIS — R0602 Shortness of breath: Secondary | ICD-10-CM | POA: Diagnosis not present

## 2018-01-12 DIAGNOSIS — M5136 Other intervertebral disc degeneration, lumbar region: Secondary | ICD-10-CM | POA: Diagnosis not present

## 2018-01-12 DIAGNOSIS — G35 Multiple sclerosis: Secondary | ICD-10-CM | POA: Insufficient documentation

## 2018-01-12 DIAGNOSIS — N184 Chronic kidney disease, stage 4 (severe): Secondary | ICD-10-CM | POA: Diagnosis not present

## 2018-01-12 DIAGNOSIS — Z7901 Long term (current) use of anticoagulants: Secondary | ICD-10-CM | POA: Insufficient documentation

## 2018-01-12 DIAGNOSIS — H9192 Unspecified hearing loss, left ear: Secondary | ICD-10-CM | POA: Diagnosis not present

## 2018-01-12 DIAGNOSIS — I482 Chronic atrial fibrillation: Secondary | ICD-10-CM | POA: Diagnosis not present

## 2018-01-12 DIAGNOSIS — N189 Chronic kidney disease, unspecified: Secondary | ICD-10-CM | POA: Diagnosis not present

## 2018-01-12 DIAGNOSIS — Z8673 Personal history of transient ischemic attack (TIA), and cerebral infarction without residual deficits: Secondary | ICD-10-CM | POA: Insufficient documentation

## 2018-01-12 DIAGNOSIS — Z8249 Family history of ischemic heart disease and other diseases of the circulatory system: Secondary | ICD-10-CM | POA: Diagnosis not present

## 2018-01-12 DIAGNOSIS — K219 Gastro-esophageal reflux disease without esophagitis: Secondary | ICD-10-CM | POA: Insufficient documentation

## 2018-01-12 DIAGNOSIS — I428 Other cardiomyopathies: Secondary | ICD-10-CM

## 2018-01-12 DIAGNOSIS — Z833 Family history of diabetes mellitus: Secondary | ICD-10-CM | POA: Diagnosis not present

## 2018-01-12 LAB — COMPREHENSIVE METABOLIC PANEL
ALT: 20 U/L (ref 14–54)
AST: 26 U/L (ref 15–41)
Albumin: 3.8 g/dL (ref 3.5–5.0)
Alkaline Phosphatase: 86 U/L (ref 38–126)
Anion gap: 7 (ref 5–15)
BUN: 18 mg/dL (ref 6–20)
CO2: 25 mmol/L (ref 22–32)
Calcium: 8.9 mg/dL (ref 8.9–10.3)
Chloride: 109 mmol/L (ref 101–111)
Creatinine, Ser: 2.04 mg/dL — ABNORMAL HIGH (ref 0.44–1.00)
GFR calc Af Amer: 28 mL/min — ABNORMAL LOW (ref >60)
GFR calc non Af Amer: 24 mL/min — ABNORMAL LOW (ref >60)
Glucose, Bld: 89 mg/dL (ref 65–99)
Potassium: 4.2 mmol/L (ref 3.5–5.1)
Sodium: 141 mmol/L (ref 135–145)
Total Bilirubin: 1 mg/dL (ref 0.3–1.2)
Total Protein: 6.6 g/dL (ref 6.5–8.1)

## 2018-01-12 LAB — PROTIME-INR
INR: 3.16
Prothrombin Time: 32.2 seconds — ABNORMAL HIGH (ref 11.4–15.2)

## 2018-01-12 LAB — ABO/RH: ABO/RH(D): AB POS

## 2018-01-12 LAB — TYPE AND SCREEN
ABO/RH(D): AB POS
Antibody Screen: NEGATIVE

## 2018-01-12 LAB — CBC
HCT: 34.5 % — ABNORMAL LOW (ref 36.0–46.0)
Hemoglobin: 11 g/dL — ABNORMAL LOW (ref 12.0–15.0)
MCH: 28.9 pg (ref 26.0–34.0)
MCHC: 31.9 g/dL (ref 30.0–36.0)
MCV: 90.8 fL (ref 78.0–100.0)
Platelets: 149 10*3/uL — ABNORMAL LOW (ref 150–400)
RBC: 3.8 MIL/uL — ABNORMAL LOW (ref 3.87–5.11)
RDW: 14.6 % (ref 11.5–15.5)
WBC: 3.6 10*3/uL — ABNORMAL LOW (ref 4.0–10.5)

## 2018-01-12 NOTE — Progress Notes (Signed)
Advanced Heart Failure Clinic Note   Primary cardiologist: Dr. Marlou Porch EP: Dr. Rayann Heman Referring Physician: Dr Rayann Heman PCP: [name], MD   HPI: Kristi Parker is a 67 y.o. female with history of VT, NICM, Medtronic BiV ICD, pulmonary HTN, CVA 2004, PAF, and CKD III-IV.   She last saw Dr Rayann Heman on 01/03/18 for routine f/u. She reported having more SOB with moderate activity. Her lisinopril was stopped and she was started on Entresto after 36 hour washout period.   She was referred by Dr. Rayann Heman for evaluation and management of her heart failure and possible advanced therapy consideration.   She presents today with her husband to establish care. She was first diagnosed with HF in 2013. She is unsure of the cause. She had a heart cath and did not have any blockages. We also follow her son in HF clinic for NICM. She states that multiple family members have severe HF and several have underwent transplant.  Earlier this month, she was having extreme fatigue and SOB with minimal exertion. She took extra 20 mg of lasix x2 days and felt better. She gets SOB with moderate exertion, like walking upstairs. She cannot keep up with her husband on walks. She has chronic bendopnea, so she avoids bending over. She is fatigued and has a decreased appetite. Denies orthopnea, PND, edema. No CP. Rare dizziness with standing up too quickly. No bleeding on coumadin. She goes to the [facility] 3x/week and rides the bike and lifts weights but this has been getting more difficult but she tries to push through it. Weighs 124-126 lbs. Tries to limit salt. Drinks less than 2 L/day.  Recent CPX 12/27/17: Peak VO2 13.5, slope 42  SH: Lives at home with her husband. No ETOH, tobacco, or drugs. Able to afford medications. Used to work for [employer].   FH: She has several family members on her father's side who have HF, two of which have required heart transplants. Her son also has HF.  CPX 12/27/17: Peak VO2:  13.5 (60% predicted peak VO2) VE/VCO2 slope: 42 OUES: 0.77 Peak RER: 1.19 Ventilatory Threshold: 9.7 (43% predicted and 72% measured peak VO2) Peak RR 29 Peak Ventilation: 37.1 VE/MVV: 57% PETCO2 at peak: 27 O2pulse: 7  (88% predicted O2pulse)  Echo 12/14/17: - Left ventricle: The cavity size was mildly dilated. Wall   thickness was normal. Systolic function was severely reduced. The   estimated ejection fraction was in the range of 20% to 25%.   Diffuse hypokinesis. The study is not technically sufficient to   allow evaluation of LV diastolic function. - Aortic valve: There was trivial regurgitation. - Mitral valve: Calcified annulus. Mildly thickened leaflets .   There was mild to moderate regurgitation. - Left atrium: The atrium was severely dilated. - Right ventricle: Systolic function was mildly reduced. - Right atrium: The atrium was severely dilated. - Atrial septum: The septum bowed from left to right, consistent   with increased left atrial pressure. - Pulmonary arteries: Systolic pressure was moderately to severely   increased.  Review of Systems: [y] = yes, [ ]  = no   General: Weight gain [ ] ; Weight loss [ ] ; Anorexia Blue.Reese ]; Fatigue Blue.Reese ]; Fever [ ] ; Chills [ ] ; Weakness Blue.Reese ]  Cardiac: Chest pain/pressure [ ] ; Resting SOB [ ] ; Exertional SOB Blue.Reese ]; Orthopnea [ ] ; Pedal Edema [ ] ; Palpitations [ ] ; Syncope [ ] ; Presyncope [ ] ; Paroxysmal nocturnal dyspnea[ ]   Pulmonary: Cough [ ] ; Wheezing[ ] ;  Hemoptysis[ ] ; Sputum [ ] ; Snoring [ ]   GI: Vomiting[ ] ; Dysphagia[ ] ; Melena[ ] ; Hematochezia [ ] ; Heartburn[ ] ; Abdominal pain [ ] ; Constipation [ ] ; Diarrhea [ ] ; BRBPR [ ]   GU: Hematuria[ ] ; Dysuria [ ] ; Nocturia[ ]   Vascular: Pain in legs with walking [ ] ; Pain in feet with lying flat [ ] ; Non-healing sores [ ] ; Stroke [ ] ; TIA [ ] ; Slurred speech [ ] ;  Neuro: Headaches[ ] ; Vertigo[ ] ; Seizures[ ] ; Paresthesias[ ] ;Blurred vision [ ] ; Diplopia [ ] ; Vision changes [ ]     Ortho/Skin: Arthritis Blue.Reese ]; Joint pain [ ] ; Muscle pain [ ] ; Joint swelling [ ] ; Back Pain [ ] ; Rash [ ]   Psych: Depression[ ] ; Anxiety[ ]   Heme: Bleeding problems [ ] ; Clotting disorders [ ] ; Anemia [ ]   Endocrine: Diabetes [ ] ; Thyroid dysfunction[ ]    Past Medical History:  Diagnosis Date  . Adenomatous colon polyp   . Cervical radiculopathy at C5   . Chronic systolic heart failure ([place])   . CKD (chronic kidney disease), stage III ([place])   . DDD (degenerative disc disease), cervical   . DDD (degenerative disc disease), lumbar   . Fibroid   . GERD (gastroesophageal reflux disease)   . Hearing loss 02/2010   L hearing loss/vertigo, steroids  . History of seizure disorder    Related Hx  . Mitral regurgitation    moderate  . Multiple sclerosis ([place]) 1984  . Nonischemic cardiomyopathy (H[facility])    Moderate LVEF 35-40% by ECHO 2011, 25-30% by echo 2013  . Permanent atrial fibrillation ([place])   . Pulmonary hypertension, secondary 06/04/2013   As a result of nonischemic cardiomyopathy EF 25-30%  . Stroke [facility])    Right brain CVA, complete recovery 07/2003  . Ventricular tachycardia ([place])   . Vertigo 02/2010   L hearing loss/vertigo, steroids    Current Outpatient Medications  Medication Sig Dispense Refill  . acetaminophen (TYLENOL) 500 MG tablet Take 500-1,000 mg by mouth every 6 (six) hours as needed (for back pain).    . ALPHAGAN P 0.1 % SOLN Place 1 drop into the left eye 2 (two) times daily.    [name] amiodarone (PACERONE) 200 MG tablet Take 0.5 tablets (100 mg total) by mouth daily. 45 tablet 3  . BIOTIN PO Take 1 capsule by mouth daily.    . Calcium Carbonate-Vitamin D (CALCIUM + D PO) Take 1 tablet by mouth daily.     . carvedilol (COREG) 3.125 MG tablet TAKE 1 TABLET BY MOUTH TWICE A DAY 180 tablet 3  . fexofenadine (ALLEGRA) 180 MG tablet Take 180 mg by mouth daily as needed for allergies.     . furosemide (LASIX) 40 MG tablet Take 40 mg by mouth daily. May take an extra 1/2  tablet by mouth as directed for swelling    . levothyroxine (SYNTHROID, LEVOTHROID) 50 MCG tablet Take 50 mcg by mouth daily.    [name] loratadine (CLARITIN) 10 MG tablet Take 10 mg by mouth daily as needed for allergies.    [name] MAGNESIUM PO Take 1 tablet by mouth daily.    Salley Scarlet [facility]  Onychomycosis Nail Lacquer -  Fluconazole 2%, Terbinafine 1% DMSO Apply to affected nail once daily Qty. 120 gm 3 refills    . NON FORMULARY [facility]  Onychomycosis Nail Lacquer -  Fluconazole 2%, Terbinafine 1% DMSO Apply to affected nail once daily Qty. 120 gm 3 refills    . NONFORMULARY OR COMPOUNDED  ITEM [facility]:  Onychomycosis Nail Lacquer - Fluconazole 2%, Terbinafine 1%, DMSO apply to affected area daily 120 each 3  . potassium chloride (K-DUR) 10 MEQ tablet TAKE TWO (2) TABLETS BY MOUTH DAILY 180 tablet 2  . sacubitril-valsartan (ENTRESTO) 24-26 MG Take 1 tablet by mouth 2 (two) times daily. 60 tablet 11  . warfarin (COUMADIN) 5 MG tablet Take as directed by coumadin clinic 20 tablet 3   No current facility-administered medications for this encounter.     No Known Allergies    Social History   Socioeconomic History  . Marital status: Married    Spouse name: Not on file  . Number of children: Not on file  . Years of education: Not on file  . Highest education level: Not on file  Occupational History  . Not on file  Social Needs  . Financial resource strain: Not on file  . Food insecurity:    Worry: Not on file    Inability: Not on file  . Transportation needs:    Medical: Not on file    Non-medical: Not on file  Tobacco Use  . Smoking status: Never Smoker  . Smokeless tobacco: Never Used  Substance and Sexual Activity  . Alcohol use: Yes    Comment: rare  . Drug use: No  . Sexual activity: Yes    Birth control/protection: Post-menopausal  Lifestyle  . Physical activity:    Days per week: Not on file    Minutes per session: Not on file  .  Stress: Not on file  Relationships  . Social connections:    Talks on phone: Not on file    Gets together: Not on file    Attends religious service: Not on file    Active member of club or organization: Not on file    Attends meetings of clubs or organizations: Not on file    Relationship status: Not on file  . Intimate partner violence:    Fear of current or ex partner: Not on file    Emotionally abused: Not on file    Physically abused: Not on file    Forced sexual activity: Not on file  Other Topics Concern  . Not on file  Social History Narrative  . Not on file      Family History  Problem Relation Age of Onset  . Diabetes Mother   . Multiple sclerosis Mother   . Cancer Father        ?  . CAD Unknown        Father's side CAD    Vitals:   01/12/18 1359  BP: (!) 101/53  Pulse: 82  SpO2: 100%  Weight: 125 lb 8 oz (56.9 kg)   Wt Readings from Last 3 Encounters:  01/12/18 125 lb 8 oz (56.9 kg)  01/03/18 126 lb (57.2 kg)  12/14/17 129 lb (58.5 kg)    PHYSICAL EXAM: General:  Fatigued appearing. No respiratory difficulty HEENT: normal. Anicteric  Neck: supple. no JVD. Carotids 2+ bilat; no bruits. No lymphadenopathy or thyromegaly appreciated. Cor: PMI nondisplaced. Regular rate & rhythm. No rubs, gallops or murmurs. Lungs: clear no wheeze Abdomen: soft, nontender, nondistended. No hepatosplenomegaly. No bruits or masses. Good bowel sounds. Extremities: no cyanosis, clubbing, rash, edema. warm Neuro: alert & oriented x 3, cranial nerves grossly intact. moves all 4 extremities w/o difficulty. Affect pleasant  ECG 01/03/18: AV paced, 82 bpm. Personally reviewed  ASSESSMENT & PLAN:  1. Chronic Systolic Heart Failure due to NICM.  S/p Medtronic BiV ICD. Likely familial CM.  - Echo 12/14/17: EF 20-25%, trivial AI, mild to mod MR, mildly reduced RV, severely dilated RA and LA, PA pressures moderately to severely increased.  - CPX 12/27/17: Peak VO2 13.5, slope 42 - NYHA  class III - Volume status stable - Continue lasix 40 mg daily  - Continue Entresto 24/26 BID. BMET today - Continue Coreg 3.125 mg BID - Consider spiro at next visit if creatinine remains stable.  - Schedule R/LHC - Refer for genetic testing for familial cardiomyopathy - Refer for appointment with VAD coordinators for transplant consideration. She reports that she is AB+.  2. PAF - Continue coumadin per coumadin clinic - She is not interested in NOAC  3. CKD, baseline creatinine ~1.6 - BMET today with recent addition of Entresto  4. MR - Mild to moderate on most recent echo  5. Pulmonary HTN on echo - Scheduling for RHC as above  6. Hx of VT s/p Medtronic biV ICD - Continue amiodarone 100 mg daily   Follow up in 6 weeks  Georgiana Shore, NP 01/12/18   Patient seen and examined with the above-signed Advanced Practice Provider and/or Housestaff. I personally reviewed laboratory data, imaging studies and relevant notes. I independently examined the patient and formulated the important aspects of the plan. I have edited the note to reflect any of my changes or salient points. I have personally discussed the plan with the patient and/or family.  Kristi Parker is a 67 y/o woman with severe NICM due to likely familial CM. EF 20-25%. Now with progressive NYHA IIIb HF symptoms. CPX confirms severe HF limitation. I had a long talk with her and her husband about triggers for advanced HF therapies and importance of not missing her window. They understand and are willing to proceed. Unfortunately her renal function is also fairly compromised with a baseline creatinine 1.6-1.8 (and is up further today to 2.0 with switch to [facility]). She will require evaluation for heart-kidney transplant. Blood type AB+ and small stature should help facilitate.   We initially planned for R/L cath next week. Will recheck creatinine next week if back down to 1.8 or less will proceed with R/L with minimal contrast  otherwise just RHC. Discussed with VAD coordinators who will contact her and begin w/u for advanced therapies.   Will refer for genetic testing to facilitate family testing.   Total time personally spent 45 minutes. Over half that time spent discussing above.   Glori Bickers, MD  1:05 PM

## 2018-01-12 NOTE — Patient Instructions (Signed)
You have been referred to Dr. Broadus John for Genetic testing  Your physician has requested that you have a cardiac catheterization. Cardiac catheterization is used to diagnose and/or treat various heart conditions. Doctors may recommend this procedure for a number of different reasons. The most common reason is to evaluate chest pain. Chest pain can be a symptom of coronary artery disease (CAD), and cardiac catheterization can show whether plaque is narrowing or blocking your heart's arteries. This procedure is also used to evaluate the valves, as well as measure the blood flow and oxygen levels in different parts of your heart. For further information please visit [email or web address]. Please follow instruction sheet, as given.  You have been referred to VAD coordinators (they will call you)    Your physician recommends that you schedule a follow-up appointment in: 6 weeks with Dr. Haroldine Laws

## 2018-01-12 NOTE — H&P (View-Only) (Signed)
Advanced Heart Failure Clinic Note   Primary cardiologist: Dr. Marlou Porch EP: Dr. Rayann Heman Referring Physician: Dr Rayann Heman PCP: [name], MD   HPI: Kristi Parker is a 67 y.o. female with history of VT, NICM, Medtronic BiV ICD, pulmonary HTN, CVA 2004, PAF, and CKD III-IV.   She last saw Dr Rayann Heman on 01/03/18 for routine f/u. She reported having more SOB with moderate activity. Her lisinopril was stopped and she was started on Entresto after 36 hour washout period.   She was referred by Dr. Rayann Heman for evaluation and management of her heart failure and possible advanced therapy consideration.   She presents today with her husband to establish care. She was first diagnosed with HF in 2013. She is unsure of the cause. She had a heart cath and did not have any blockages. We also follow her son in HF clinic for NICM. She states that multiple family members have severe HF and several have underwent transplant.  Earlier this month, she was having extreme fatigue and SOB with minimal exertion. She took extra 20 mg of lasix x2 days and felt better. She gets SOB with moderate exertion, like walking upstairs. She cannot keep up with her husband on walks. She has chronic bendopnea, so she avoids bending over. She is fatigued and has a decreased appetite. Denies orthopnea, PND, edema. No CP. Rare dizziness with standing up too quickly. No bleeding on coumadin. She goes to the [facility] 3x/week and rides the bike and lifts weights but this has been getting more difficult but she tries to push through it. Weighs 124-126 lbs. Tries to limit salt. Drinks less than 2 L/day.  Recent CPX 12/27/17: Peak VO2 13.5, slope 42  SH: Lives at home with her husband. No ETOH, tobacco, or drugs. Able to afford medications. Used to work for [employer].   FH: She has several family members on her father's side who have HF, two of which have required heart transplants. Her son also has HF.  CPX 12/27/17: Peak VO2:  13.5 (60% predicted peak VO2) VE/VCO2 slope: 42 OUES: 0.77 Peak RER: 1.19 Ventilatory Threshold: 9.7 (43% predicted and 72% measured peak VO2) Peak RR 29 Peak Ventilation: 37.1 VE/MVV: 57% PETCO2 at peak: 27 O2pulse: 7  (88% predicted O2pulse)  Echo 12/14/17: - Left ventricle: The cavity size was mildly dilated. Wall   thickness was normal. Systolic function was severely reduced. The   estimated ejection fraction was in the range of 20% to 25%.   Diffuse hypokinesis. The study is not technically sufficient to   allow evaluation of LV diastolic function. - Aortic valve: There was trivial regurgitation. - Mitral valve: Calcified annulus. Mildly thickened leaflets .   There was mild to moderate regurgitation. - Left atrium: The atrium was severely dilated. - Right ventricle: Systolic function was mildly reduced. - Right atrium: The atrium was severely dilated. - Atrial septum: The septum bowed from left to right, consistent   with increased left atrial pressure. - Pulmonary arteries: Systolic pressure was moderately to severely   increased.  Review of Systems: [y] = yes, [ ]  = no   General: Weight gain [ ] ; Weight loss [ ] ; Anorexia Blue.Reese ]; Fatigue Blue.Reese ]; Fever [ ] ; Chills [ ] ; Weakness Blue.Reese ]  Cardiac: Chest pain/pressure [ ] ; Resting SOB [ ] ; Exertional SOB Blue.Reese ]; Orthopnea [ ] ; Pedal Edema [ ] ; Palpitations [ ] ; Syncope [ ] ; Presyncope [ ] ; Paroxysmal nocturnal dyspnea[ ]   Pulmonary: Cough [ ] ; Wheezing[ ] ;  Hemoptysis[ ] ; Sputum [ ] ; Snoring [ ]   GI: Vomiting[ ] ; Dysphagia[ ] ; Melena[ ] ; Hematochezia [ ] ; Heartburn[ ] ; Abdominal pain [ ] ; Constipation [ ] ; Diarrhea [ ] ; BRBPR [ ]   GU: Hematuria[ ] ; Dysuria [ ] ; Nocturia[ ]   Vascular: Pain in legs with walking [ ] ; Pain in feet with lying flat [ ] ; Non-healing sores [ ] ; Stroke [ ] ; TIA [ ] ; Slurred speech [ ] ;  Neuro: Headaches[ ] ; Vertigo[ ] ; Seizures[ ] ; Paresthesias[ ] ;Blurred vision [ ] ; Diplopia [ ] ; Vision changes [ ]     Ortho/Skin: Arthritis Blue.Reese ]; Joint pain [ ] ; Muscle pain [ ] ; Joint swelling [ ] ; Back Pain [ ] ; Rash [ ]   Psych: Depression[ ] ; Anxiety[ ]   Heme: Bleeding problems [ ] ; Clotting disorders [ ] ; Anemia [ ]   Endocrine: Diabetes [ ] ; Thyroid dysfunction[ ]    Past Medical History:  Diagnosis Date  . Adenomatous colon polyp   . Cervical radiculopathy at C5   . Chronic systolic heart failure ([place])   . CKD (chronic kidney disease), stage III ([place])   . DDD (degenerative disc disease), cervical   . DDD (degenerative disc disease), lumbar   . Fibroid   . GERD (gastroesophageal reflux disease)   . Hearing loss 02/2010   L hearing loss/vertigo, steroids  . History of seizure disorder    Related Hx  . Mitral regurgitation    moderate  . Multiple sclerosis ([place]) 1984  . Nonischemic cardiomyopathy (HCC)    Moderate LVEF 35-40% by ECHO 2011, 25-30% by echo 2013  . Permanent atrial fibrillation ([place])   . Pulmonary hypertension, secondary 06/04/2013   As a result of nonischemic cardiomyopathy EF 25-30%  . Stroke [facility])    Right brain CVA, complete recovery 07/2003  . Ventricular tachycardia ([place])   . Vertigo 02/2010   L hearing loss/vertigo, steroids    Current Outpatient Medications  Medication Sig Dispense Refill  . acetaminophen (TYLENOL) 500 MG tablet Take 500-1,000 mg by mouth every 6 (six) hours as needed (for back pain).    . ALPHAGAN P 0.1 % SOLN Place 1 drop into the left eye 2 (two) times daily.    [name] amiodarone (PACERONE) 200 MG tablet Take 0.5 tablets (100 mg total) by mouth daily. 45 tablet 3  . BIOTIN PO Take 1 capsule by mouth daily.    . Calcium Carbonate-Vitamin D (CALCIUM + D PO) Take 1 tablet by mouth daily.     . carvedilol (COREG) 3.125 MG tablet TAKE 1 TABLET BY MOUTH TWICE A DAY 180 tablet 3  . fexofenadine (ALLEGRA) 180 MG tablet Take 180 mg by mouth daily as needed for allergies.     . furosemide (LASIX) 40 MG tablet Take 40 mg by mouth daily. May take an extra 1/2  tablet by mouth as directed for swelling    . levothyroxine (SYNTHROID, LEVOTHROID) 50 MCG tablet Take 50 mcg by mouth daily.    [name] loratadine (CLARITIN) 10 MG tablet Take 10 mg by mouth daily as needed for allergies.    [name] MAGNESIUM PO Take 1 tablet by mouth daily.    Salley Scarlet FORMUL[facility]  Onychomycosis Nail Lacquer -  Fluconazole 2%, Terbinafine 1% DMSO Apply to affected nail once daily Qty. 120 gm 3 refills    . NON FORMULARY [facility]  Onychomycosis Nail Lacquer -  Fluconazole 2%, Terbinafine 1% DMSO Apply to affected nail once daily Qty. 120 gm 3 refills    . NONFORMULARY OR COMPOUNDED  ITEM [facility]:  Onychomycosis Nail Lacquer - Fluconazole 2%, Terbinafine 1%, DMSO apply to affected area daily 120 each 3  . potassium chloride (K-DUR) 10 MEQ tablet TAKE TWO (2) TABLETS BY MOUTH DAILY 180 tablet 2  . sacubitril-valsartan (ENTRESTO) 24-26 MG Take 1 tablet by mouth 2 (two) times daily. 60 tablet 11  . warfarin (COUMADIN) 5 MG tablet Take as directed by coumadin clinic 20 tablet 3   No current facility-administered medications for this encounter.     No Known Allergies    Social History   Socioeconomic History  . Marital status: Married    Spouse name: Not on file  . Number of children: Not on file  . Years of education: Not on file  . Highest education level: Not on file  Occupational History  . Not on file  Social Needs  . Financial resource strain: Not on file  . Food insecurity:    Worry: Not on file    Inability: Not on file  . Transportation needs:    Medical: Not on file    Non-medical: Not on file  Tobacco Use  . Smoking status: Never Smoker  . Smokeless tobacco: Never Used  Substance and Sexual Activity  . Alcohol use: Yes    Comment: rare  . Drug use: No  . Sexual activity: Yes    Birth control/protection: Post-menopausal  Lifestyle  . Physical activity:    Days per week: Not on file    Minutes per session: Not on file  .  Stress: Not on file  Relationships  . Social connections:    Talks on phone: Not on file    Gets together: Not on file    Attends religious service: Not on file    Active member of club or organization: Not on file    Attends meetings of clubs or organizations: Not on file    Relationship status: Not on file  . Intimate partner violence:    Fear of current or ex partner: Not on file    Emotionally abused: Not on file    Physically abused: Not on file    Forced sexual activity: Not on file  Other Topics Concern  . Not on file  Social History Narrative  . Not on file      Family History  Problem Relation Age of Onset  . Diabetes Mother   . Multiple sclerosis Mother   . Cancer Father        ?  . CAD Unknown        Father's side CAD    Vitals:   01/12/18 1359  BP: (!) 101/53  Pulse: 82  SpO2: 100%  Weight: 125 lb 8 oz (56.9 kg)   Wt Readings from Last 3 Encounters:  01/12/18 125 lb 8 oz (56.9 kg)  01/03/18 126 lb (57.2 kg)  12/14/17 129 lb (58.5 kg)    PHYSICAL EXAM: General:  Fatigued appearing. No respiratory difficulty HEENT: normal. Anicteric  Neck: supple. no JVD. Carotids 2+ bilat; no bruits. No lymphadenopathy or thyromegaly appreciated. Cor: PMI nondisplaced. Regular rate & rhythm. No rubs, gallops or murmurs. Lungs: clear no wheeze Abdomen: soft, nontender, nondistended. No hepatosplenomegaly. No bruits or masses. Good bowel sounds. Extremities: no cyanosis, clubbing, rash, edema. warm Neuro: alert & oriented x 3, cranial nerves grossly intact. moves all 4 extremities w/o difficulty. Affect pleasant  ECG 01/03/18: AV paced, 82 bpm. Personally reviewed  ASSESSMENT & PLAN:  1. Chronic Systolic Heart Failure due to NICM.  S/p Medtronic BiV ICD. Likely familial CM.  - Echo 12/14/17: EF 20-25%, trivial AI, mild to mod MR, mildly reduced RV, severely dilated RA and LA, PA pressures moderately to severely increased.  - CPX 12/27/17: Peak VO2 13.5, slope 42 - NYHA  class III - Volume status stable - Continue lasix 40 mg daily  - Continue Entresto 24/26 BID. BMET today - Continue Coreg 3.125 mg BID - Consider spiro at next visit if creatinine remains stable.  - Schedule R/LHC - Refer for genetic testing for familial cardiomyopathy - Refer for appointment with VAD coordinators for transplant consideration. She reports that she is AB+.  2. PAF - Continue coumadin per coumadin clinic - She is not interested in NOAC  3. CKD, baseline creatinine ~1.6 - BMET today with recent addition of Entresto  4. MR - Mild to moderate on most recent echo  5. Pulmonary HTN on echo - Scheduling for RHC as above  6. Hx of VT s/p Medtronic biV ICD - Continue amiodarone 100 mg daily   Follow up in 6 weeks  Kristi Shore, NP 01/12/18   Patient seen and examined with the above-signed Advanced Practice Provider and/or Housestaff. I personally reviewed laboratory data, imaging studies and relevant notes. I independently examined the patient and formulated the important aspects of the plan. I have edited the note to reflect any of my changes or salient points. I have personally discussed the plan with the patient and/or family.  Kristi Parker is a 67 y/o woman with severe NICM due to likely familial CM. EF 20-25%. Now with progressive NYHA IIIb HF symptoms. CPX confirms severe HF limitation. I had a long talk with her and her husband about triggers for advanced HF therapies and importance of not missing her window. They understand and are willing to proceed. Unfortunately her renal function is also fairly compromised with a baseline creatinine 1.6-1.8 (and is up further today to 2.0 with switch to [facility]). She will require evaluation for heart-kidney transplant. Blood type AB+ and small stature should help facilitate.   We initially planned for R/L cath next week. Will recheck creatinine next week if back down to 1.8 or less will proceed with R/L with minimal contrast  otherwise just RHC. Discussed with VAD coordinators who will contact her and begin w/u for advanced therapies.   Will refer for genetic testing to facilitate family testing.   Total time personally spent 45 minutes. Over half that time spent discussing above.   Glori Bickers, MD  1:05 PM

## 2018-01-15 ENCOUNTER — Encounter: Payer: Medicare Other | Admitting: Internal Medicine

## 2018-01-17 ENCOUNTER — Encounter (HOSPITAL_COMMUNITY): Payer: Self-pay | Admitting: Unknown Physician Specialty

## 2018-01-17 NOTE — Progress Notes (Signed)
Transplant packet for heart/kidney referral sent to [facility] at [facility] via email.  Tanda Rockers RN, BSN VAD Coordinator 24/7 Pager [phone]

## 2018-01-18 ENCOUNTER — Ambulatory Visit: Payer: Medicare Other

## 2018-01-26 ENCOUNTER — Ambulatory Visit: Payer: Medicare Other | Admitting: *Deleted

## 2018-01-26 DIAGNOSIS — Z5181 Encounter for therapeutic drug level monitoring: Secondary | ICD-10-CM

## 2018-01-26 DIAGNOSIS — I4891 Unspecified atrial fibrillation: Secondary | ICD-10-CM

## 2018-01-26 LAB — POCT INR: INR: 1.7 — AB (ref 2.0–3.0)

## 2018-01-26 NOTE — Patient Instructions (Signed)
Description   Today take take 1/2 tablet then continue taking 1/2 tablet daily except No Coumadin on Mondays and Fridays.  Recheck in 2 weeks. Call our office if you are placed on any new medications or if you have any upcoming procedures [phone].

## 2018-01-30 ENCOUNTER — Ambulatory Visit (INDEPENDENT_AMBULATORY_CARE_PROVIDER_SITE_OTHER): Payer: Medicare Other | Admitting: Pharmacist

## 2018-01-30 ENCOUNTER — Other Ambulatory Visit: Payer: Self-pay | Admitting: Cardiology

## 2018-01-30 DIAGNOSIS — I5022 Chronic systolic (congestive) heart failure: Secondary | ICD-10-CM | POA: Diagnosis not present

## 2018-01-30 MED ORDER — ENOXAPARIN SODIUM 60 MG/0.6ML ~~LOC~~ SOLN: 60.0000 mg | SUBCUTANEOUS | 0 refills | Status: DC

## 2018-01-30 NOTE — Patient Instructions (Addendum)
Limit your sodium to less than 2,000mg  a day  Limit your fluid intake to less than 1 liter (34 ounces) a day  Continue taking your current medications  Follow up with heart failure clinic as advised   For your blood thinners:  6/18: Inject Lovenox 60mg  subcutaneously once a day. No warfarin.  6/19: Inject Lovenox 60mg  subcutaneously at 8pm. No warfarin.   6/20: no Lovenox or warfarin.  6/21: procedure day - no Lovenox. OK to resume warfarin in the evening or as directed by doctor  6/22: resume Lovenox 60mg  at 8am. Take your normal warfarin dose  6/23: Inject Lovenox 60mg  at 8am, take normal warfarin dose  6/24:  Inject Lovenox 60mg  at 8am, take normal warfarin dose  6/25:  Inject Lovenox 60mg  at 8am, take normal warfarin dose  6/26: come to Coumadin clinic for INR check

## 2018-01-30 NOTE — Progress Notes (Signed)
Patient ID: Kristi Parker                 DOB: 28-May-1951                      MRN: [id number]     HPI: Kristi Parker is a 67 y.o. female referred by Dr. Rayann Heman to HTN clinic. PMH is significant for nonischemic cardiomyopathy with LVEF of 20-25% on 12/2017 echo, BiV ICD, pulmonary HTN, CVA in 2004, PAF, and CKD. She was switched from lisinopril to [facility] on 5/22 office visit with Dr Rayann Heman and was also referred to HF clinic after most recent echo.   Pt presents today in good spirits. She reports weighing herself at home every day. Her weight is reported to a nurse at [facility] since she is part of their CHF cardiac program. Spironolactone was considered at her visit at HF clinic, however SCr increased to 2 after starting Entresto. She denies dizziness, blurred vision, falls, or headache. She has noticed some fatigue however she attributes this to staying up late.  Pt also mentions that she has been holding her warfarin for a left and right heart cath on 6/21. She states she was advised to hold her warfarin for 3-4 days prior to her cath. Pt takes warfarin for afib with CHADS2VASc score of 6 (age, sex, CHF, CVA). CrCl is 30mL/min. She will require Lovenox bridging due to history of afib with CVA. She has not used Lovenox injections before.  Current HTN meds: Entresto 24-26mg  BID, carvedilol 3.125mg  BID, Lasix 40mg  daily  BP goal: <130/83mmHg  Family History: Mother with MS and DM, father with cancer.  Social History: Denies tobacco, alcohol, and illicit drug use.  Diet: Likes vegetables and fruit, does not eat much meat. Does not eat canned vegetables.  [facility] [employer] [facility] Uses the recumbent bike, treadmill  Home BP readings: does not check  Wt Readings from Last 3 Encounters:  01/12/18 125 lb 8 oz (56.9 kg)  01/03/18 126 lb (57.2 kg)  12/14/17 129 lb (58.5 kg)   BP Readings from Last 3 Encounters:  01/12/18 (!) 101/53  01/03/18 (!) 110/56    12/14/17 120/76   Pulse Readings from Last 3 Encounters:  01/12/18 82  01/03/18 82  12/14/17 80    Renal function: CrCl cannot be calculated (Unknown ideal weight.).  Past Medical History:  Diagnosis Date  . Adenomatous colon polyp   . Cervical radiculopathy at C5   . Chronic systolic heart failure ([place])   . CKD (chronic kidney disease), stage III ([place])   . DDD (degenerative disc disease), cervical   . DDD (degenerative disc disease), lumbar   . Fibroid   . GERD (gastroesophageal reflux disease)   . Hearing loss 02/2010   L hearing loss/vertigo, steroids  . History of seizure disorder    Related Hx  . Mitral regurgitation    moderate  . Multiple sclerosis ([place]) 1984  . Nonischemic cardiomyopathy (H[facility])    Moderate LVEF 35-40% by ECHO 2011, 25-30% by echo 2013  . Permanent atrial fibrillation ([place])   . Pulmonary hypertension, secondary 06/04/2013   As a result of nonischemic cardiomyopathy EF 25-30%  . Stroke [facility])    Right brain CVA, complete recovery 07/2003  . Ventricular tachycardia ([place])   . Vertigo 02/2010   L hearing loss/vertigo, steroids    Current Outpatient Medications on File Prior to Visit  Medication Sig Dispense Refill  .  acetaminophen (TYLENOL) 500 MG tablet Take 500-1,000 mg by mouth every 6 (six) hours as needed (for back pain).    . ALPHAGAN P 0.1 % SOLN Place 1 drop into the left eye 2 (two) times daily.    [name] amiodarone (PACERONE) 200 MG tablet Take 0.5 tablets (100 mg total) by mouth daily. 45 tablet 3  . BIOTIN PO Take 1 capsule by mouth daily.    . Calcium Carbonate-Vitamin D (CALCIUM + D PO) Take 1 tablet by mouth daily.     . carvedilol (COREG) 3.125 MG tablet TAKE 1 TABLET BY MOUTH TWICE A DAY 180 tablet 3  . fexofenadine (ALLEGRA) 180 MG tablet Take 180 mg by mouth daily as needed for allergies.     . furosemide (LASIX) 40 MG tablet Take 40 mg by mouth daily. May take an extra 1/2 tablet by mouth as directed for swelling    . levothyroxine  (SYNTHROID, LEVOTHROID) 50 MCG tablet Take 50 mcg by mouth daily before breakfast.     . loratadine (CLARITIN) 10 MG tablet Take 10 mg by mouth daily as needed for allergies.    [name] MAGNESIUM PO Take 1 tablet by mouth daily.    . NON FORMULARY Apply 1 application topically 2 (two) times daily. [facility]  Onychomycosis Nail Lacquer -  Fluconazole 2%, Terbinafine 1% DMSO Apply to affected nail twice daily Qty. 120 gm 3 refills    . potassium chloride (K-DUR) 10 MEQ tablet TAKE TWO (2) TABLETS BY MOUTH DAILY 180 tablet 2  . sacubitril-valsartan (ENTRESTO) 24-26 MG Take 1 tablet by mouth 2 (two) times daily. 60 tablet 11  . warfarin (COUMADIN) 5 MG tablet Take as directed by coumadin clinic (Patient taking differently: Take 2.5 mg by mouth See admin instructions. Take as directed by coumadin clinic 2.5 mg all days except Mon and Friday, not taking any pill) 20 tablet 3   No current facility-administered medications on file prior to visit.     No Known Allergies   Assessment/Plan:  1. Hypertension - BP soft today at 98/56 however pt is asymptomatic. Will continue Entresto 24-26mg  BID, carvedilol 3.125mg  BID, and furosemide 40mg  daily. Unable to start spironolactone due to SCr increase to 2 and low BP today. Encouraged pt to limit sodium to <2g and fluid intake to <2L. She has f/u scheduled at CHF clinic.  2. Anticoagulation - Pt requires Lovenox bridging for upcoming cath on 6/21 due to hx of afib and CVA. CrCl 5mL/min, will use reduced dose of 1mg /kg once daily with last injection 48 hours prior to cath due to reduced renal function.   Erlene Devita E. Sherena Machorro, PharmD, BCACP, [place] [address], [place], [place] [address] Phone: [phone]; Fax: [phone] 01/30/2018 3:55 PM

## 2018-02-02 ENCOUNTER — Encounter (HOSPITAL_COMMUNITY)
Admission: RE | Disposition: A | Discharge status: Home / Self Care | Payer: Self-pay | Source: Ambulatory Visit | Attending: Internal Medicine

## 2018-02-02 ENCOUNTER — Ambulatory Visit (HOSPITAL_COMMUNITY)
Admission: RE | Admit: 2018-02-02 | Discharge: 2018-02-02 | Disposition: A | Discharge status: Home / Self Care | Payer: Medicare Other | Source: Ambulatory Visit | Attending: Internal Medicine | Admitting: Internal Medicine

## 2018-02-02 DIAGNOSIS — G40909 Epilepsy, unspecified, not intractable, without status epilepticus: Secondary | ICD-10-CM | POA: Diagnosis not present

## 2018-02-02 DIAGNOSIS — I340 Nonrheumatic mitral (valve) insufficiency: Secondary | ICD-10-CM | POA: Diagnosis not present

## 2018-02-02 DIAGNOSIS — Z86010 Personal history of colonic polyps: Secondary | ICD-10-CM | POA: Insufficient documentation

## 2018-02-02 DIAGNOSIS — I429 Cardiomyopathy, unspecified: Secondary | ICD-10-CM | POA: Diagnosis not present

## 2018-02-02 DIAGNOSIS — K219 Gastro-esophageal reflux disease without esophagitis: Secondary | ICD-10-CM | POA: Diagnosis not present

## 2018-02-02 DIAGNOSIS — Z8249 Family history of ischemic heart disease and other diseases of the circulatory system: Secondary | ICD-10-CM | POA: Insufficient documentation

## 2018-02-02 DIAGNOSIS — I5022 Chronic systolic (congestive) heart failure: Secondary | ICD-10-CM

## 2018-02-02 DIAGNOSIS — Z820 Family history of epilepsy and other diseases of the nervous system: Secondary | ICD-10-CM | POA: Insufficient documentation

## 2018-02-02 DIAGNOSIS — G35 Multiple sclerosis: Secondary | ICD-10-CM | POA: Insufficient documentation

## 2018-02-02 DIAGNOSIS — Z7901 Long term (current) use of anticoagulants: Secondary | ICD-10-CM | POA: Diagnosis not present

## 2018-02-02 DIAGNOSIS — Z95810 Presence of automatic (implantable) cardiac defibrillator: Secondary | ICD-10-CM | POA: Diagnosis not present

## 2018-02-02 DIAGNOSIS — M5412 Radiculopathy, cervical region: Secondary | ICD-10-CM | POA: Insufficient documentation

## 2018-02-02 DIAGNOSIS — Z8673 Personal history of transient ischemic attack (TIA), and cerebral infarction without residual deficits: Secondary | ICD-10-CM | POA: Diagnosis not present

## 2018-02-02 DIAGNOSIS — Z79899 Other long term (current) drug therapy: Secondary | ICD-10-CM | POA: Insufficient documentation

## 2018-02-02 DIAGNOSIS — H9192 Unspecified hearing loss, left ear: Secondary | ICD-10-CM | POA: Insufficient documentation

## 2018-02-02 DIAGNOSIS — M5136 Other intervertebral disc degeneration, lumbar region: Secondary | ICD-10-CM | POA: Insufficient documentation

## 2018-02-02 DIAGNOSIS — I272 Pulmonary hypertension, unspecified: Secondary | ICD-10-CM | POA: Insufficient documentation

## 2018-02-02 DIAGNOSIS — N184 Chronic kidney disease, stage 4 (severe): Secondary | ICD-10-CM | POA: Insufficient documentation

## 2018-02-02 DIAGNOSIS — I480 Paroxysmal atrial fibrillation: Secondary | ICD-10-CM | POA: Insufficient documentation

## 2018-02-02 DIAGNOSIS — I482 Chronic atrial fibrillation: Secondary | ICD-10-CM | POA: Insufficient documentation

## 2018-02-02 DIAGNOSIS — Z79890 Hormone replacement therapy (postmenopausal): Secondary | ICD-10-CM | POA: Diagnosis not present

## 2018-02-02 HISTORY — PX: RIGHT/LEFT HEART CATH AND CORONARY ANGIOGRAPHY: CATH118266

## 2018-02-02 LAB — POCT I-STAT 3, ART BLOOD GAS (G3+)
Acid-base deficit: 3 mmol/L — ABNORMAL HIGH (ref 0.0–2.0)
Bicarbonate: 22.1 mmol/L (ref 20.0–28.0)
O2 Saturation: 95 %
TCO2: 23 mmol/L (ref 22–32)
pCO2 arterial: 36.5 mmHg (ref 32.0–48.0)
pH, Arterial: 7.39 (ref 7.350–7.450)
pO2, Arterial: 79 mmHg — ABNORMAL LOW (ref 83.0–108.0)

## 2018-02-02 LAB — POCT I-STAT 3, VENOUS BLOOD GAS (G3P V)
Acid-base deficit: 2 mmol/L (ref 0.0–2.0)
Acid-base deficit: 3 mmol/L — ABNORMAL HIGH (ref 0.0–2.0)
Bicarbonate: 22.1 mmol/L (ref 20.0–28.0)
Bicarbonate: 23.5 mmol/L (ref 20.0–28.0)
O2 Saturation: 66 %
O2 Saturation: 69 %
TCO2: 23 mmol/L (ref 22–32)
TCO2: 25 mmol/L (ref 22–32)
pCO2, Ven: 39.7 mmHg — ABNORMAL LOW (ref 44.0–60.0)
pCO2, Ven: 41.1 mmHg — ABNORMAL LOW (ref 44.0–60.0)
pH, Ven: 7.353 (ref 7.250–7.430)
pH, Ven: 7.366 (ref 7.250–7.430)
pO2, Ven: 36 mmHg (ref 32.0–45.0)
pO2, Ven: 37 mmHg (ref 32.0–45.0)

## 2018-02-02 LAB — BASIC METABOLIC PANEL
Anion gap: 7 (ref 5–15)
BUN: 19 mg/dL (ref 6–20)
CO2: 27 mmol/L (ref 22–32)
Calcium: 9.5 mg/dL (ref 8.9–10.3)
Chloride: 106 mmol/L (ref 101–111)
Creatinine, Ser: 1.86 mg/dL — ABNORMAL HIGH (ref 0.44–1.00)
GFR calc Af Amer: 31 mL/min — ABNORMAL LOW (ref >60)
GFR calc non Af Amer: 27 mL/min — ABNORMAL LOW (ref >60)
Glucose, Bld: 96 mg/dL (ref 65–99)
Potassium: 4 mmol/L (ref 3.5–5.1)
Sodium: 140 mmol/L (ref 135–145)

## 2018-02-02 LAB — PROTIME-INR
INR: 1.55
Prothrombin Time: 18.5 seconds — ABNORMAL HIGH (ref 11.4–15.2)

## 2018-02-02 SURGERY — RIGHT/LEFT HEART CATH AND CORONARY ANGIOGRAPHY
Anesthesia: LOCAL

## 2018-02-02 MED ORDER — MIDAZOLAM HCL 2 MG/2ML IJ SOLN
INTRAMUSCULAR | Status: DC | PRN
  Administered 2018-02-02: 1 mg via INTRAVENOUS

## 2018-02-02 MED ORDER — HEPARIN (PORCINE) IN NACL 1000-0.9 UT/500ML-% IV SOLN
INTRAVENOUS | Status: AC
  Filled 2018-02-02: qty 1000

## 2018-02-02 MED ORDER — HEPARIN SODIUM (PORCINE) 1000 UNIT/ML IJ SOLN
INTRAMUSCULAR | Status: DC | PRN
  Administered 2018-02-02: 2500 [IU] via INTRAVENOUS

## 2018-02-02 MED ORDER — LIDOCAINE HCL (PF) 1 % IJ SOLN
INTRAMUSCULAR | Status: AC
  Filled 2018-02-02: qty 30

## 2018-02-02 MED ORDER — FENTANYL CITRATE (PF) 100 MCG/2ML IJ SOLN
INTRAMUSCULAR | Status: DC | PRN
  Administered 2018-02-02: 25 ug via INTRAVENOUS

## 2018-02-02 MED ORDER — ONDANSETRON HCL 4 MG/2ML IJ SOLN: 4.0000 mg | Freq: Four times a day (QID) | INTRAMUSCULAR | Status: DC | PRN

## 2018-02-02 MED ORDER — VERAPAMIL HCL 2.5 MG/ML IV SOLN
INTRAVENOUS | Status: AC
  Filled 2018-02-02: qty 2

## 2018-02-02 MED ORDER — ACETAMINOPHEN 325 MG PO TABS: 650.0000 mg | ORAL_TABLET | ORAL | Status: DC | PRN

## 2018-02-02 MED ORDER — SODIUM CHLORIDE 0.9 % IV SOLN: 250.0000 mL | INTRAVENOUS | Status: DC | PRN

## 2018-02-02 MED ORDER — SODIUM CHLORIDE 0.9% FLUSH: 3.0000 mL | Freq: Two times a day (BID) | INTRAVENOUS | Status: DC

## 2018-02-02 MED ORDER — FENTANYL CITRATE (PF) 100 MCG/2ML IJ SOLN
INTRAMUSCULAR | Status: AC
  Filled 2018-02-02: qty 2

## 2018-02-02 MED ORDER — SODIUM CHLORIDE 0.9% FLUSH: 3.0000 mL | INTRAVENOUS | Status: DC | PRN

## 2018-02-02 MED ORDER — HEPARIN (PORCINE) IN NACL 2-0.9 UNITS/ML
INTRAMUSCULAR | Status: AC | PRN
  Administered 2018-02-02: 1000 mL

## 2018-02-02 MED ORDER — IOHEXOL 350 MG/ML SOLN
INTRAVENOUS | Status: DC | PRN
  Administered 2018-02-02: 10 mL via INTRA_ARTERIAL

## 2018-02-02 MED ORDER — LIDOCAINE HCL (PF) 1 % IJ SOLN
INTRAMUSCULAR | Status: DC | PRN
  Administered 2018-02-02 (×2): 2 mL via SUBCUTANEOUS

## 2018-02-02 MED ORDER — HEPARIN SODIUM (PORCINE) 1000 UNIT/ML IJ SOLN
INTRAMUSCULAR | Status: AC
  Filled 2018-02-02: qty 1

## 2018-02-02 MED ORDER — HEPARIN (PORCINE) IN NACL 2-0.9 UNITS/ML
INTRAMUSCULAR | Status: DC | PRN
  Administered 2018-02-02: 10 mL via INTRA_ARTERIAL

## 2018-02-02 MED ORDER — SODIUM CHLORIDE 0.9 % IV SOLN
INTRAVENOUS | Status: DC
  Administered 2018-02-02: 08:00:00 via INTRAVENOUS

## 2018-02-02 MED ORDER — ASPIRIN 81 MG PO CHEW
CHEWABLE_TABLET | ORAL | Status: AC
  Filled 2018-02-02: qty 1

## 2018-02-02 MED ORDER — MIDAZOLAM HCL 2 MG/2ML IJ SOLN
INTRAMUSCULAR | Status: AC
  Filled 2018-02-02: qty 2

## 2018-02-02 MED ORDER — SODIUM CHLORIDE 0.9 % IV SOLN: INTRAVENOUS | Status: AC

## 2018-02-02 SURGICAL SUPPLY — 10 items
CATH BALLN WEDGE 5F 110CM (CATHETERS) ×1 IMPLANT
CATH IMPULSE 5F ANG/FL3.5 (CATHETERS) ×1 IMPLANT
DEVICE RAD COMP TR BAND LRG (VASCULAR PRODUCTS) ×1 IMPLANT
GLIDESHEATH SLEND SS 6F .021 (SHEATH) ×1 IMPLANT
GUIDEWIRE INQWIRE 1.5J.035X260 (WIRE) IMPLANT
INQWIRE 1.5J .035X260CM (WIRE) ×2
KIT HEART LEFT (KITS) ×2 IMPLANT
PACK CARDIAC CATHETERIZATION (CUSTOM PROCEDURE TRAY) ×2 IMPLANT
SHEATH GLIDE SLENDER 4/5FR (SHEATH) ×1 IMPLANT
TRANSDUCER W/STOPCOCK (MISCELLANEOUS) ×2 IMPLANT

## 2018-02-02 NOTE — Discharge Instructions (Signed)
**Note Adilynn Bessey-identified via Obfuscation** Radial Site Care °Refer to this sheet in the next few weeks. These instructions provide you with information about caring for yourself after your procedure. Your health care provider may also give you more specific instructions. Your treatment has been planned according to current medical practices, but problems sometimes occur. Call your health care provider if you have any problems or questions after your procedure. °What can I expect after the procedure? °After your procedure, it is typical to have the following: °· Bruising at the radial site that usually fades within 1-2 weeks. °· Blood collecting in the tissue (hematoma) that may be painful to the touch. It should usually decrease in size and tenderness within 1-2 weeks. ° °Follow these instructions at home: °· Take medicines only as directed by your health care provider. °· You may shower 24-48 hours after the procedure or as directed by your health care provider. Remove the bandage (dressing) and gently wash the site with plain soap and water. Pat the area dry with a clean towel. Do not rub the site, because this may cause bleeding. °· Do not take baths, swim, or use a hot tub until your health care provider approves. °· Check your insertion site every day for redness, swelling, or drainage. °· Do not apply powder or lotion to the site. °· Do not flex or bend the affected arm for 24 hours or as directed by your health care provider. °· Do not push or pull heavy objects with the affected arm for 24 hours or as directed by your health care provider. °· Do not lift over 10 lb (4.5 kg) for 5 days after your procedure or as directed by your health care provider. °· Ask your health care provider when it is okay to: °? Return to work or school. °? Resume usual physical activities or sports. °? Resume sexual activity. °· Do not drive home if you are discharged the same day as the procedure. Have someone else drive you. °· You may drive 24 hours after the procedure  unless otherwise instructed by your health care provider. °· Do not operate machinery or power tools for 24 hours after the procedure. °· If your procedure was done as an outpatient procedure, which means that you went home the same day as your procedure, a responsible adult should be with you for the first 24 hours after you arrive home. °· Keep all follow-up visits as directed by your health care provider. This is important. °Contact a health care provider if: °· You have a fever. °· You have chills. °· You have increased bleeding from the radial site. Hold pressure on the site. °Get help right away if: °· You have unusual pain at the radial site. °· You have redness, warmth, or swelling at the radial site. °· You have drainage (other than a small amount of blood on the dressing) from the radial site. °· The radial site is bleeding, and the bleeding does not stop after 30 minutes of holding steady pressure on the site. °· Your arm or hand becomes pale, cool, tingly, or numb. °This information is not intended to replace advice given to you by your health care provider. Make sure you discuss any questions you have with your health care provider. °Document Released: 09/03/2010 Document Revised: 01/07/2016 Document Reviewed: 02/17/2014 °Elsevier Interactive Patient Education © 2018 [employer] Inc[employer] ° °

## 2018-02-02 NOTE — Interval H&P Note (Signed)
History and Physical Interval Note:  02/02/2018 8:31 AM  Kristi Parker  has presented today for surgery, with the diagnosis of chf  The various methods of treatment have been discussed with the patient and family. After consideration of risks, benefits and other options for treatment, the patient has consented to  Procedure(s): RIGHT/LEFT HEART CATH AND CORONARY ANGIOGRAPHY (N/A) and possible coronary angioplasty as a surgical intervention .  The patient's history has been reviewed, patient examined, no change in status, stable for surgery.  I have reviewed the patient's chart and labs.  Questions were answered to the patient's satisfaction.     [name]

## 2018-02-05 ENCOUNTER — Ambulatory Visit (INDEPENDENT_AMBULATORY_CARE_PROVIDER_SITE_OTHER): Payer: Medicare Other

## 2018-02-05 ENCOUNTER — Encounter (HOSPITAL_COMMUNITY): Payer: Self-pay | Admitting: Internal Medicine

## 2018-02-05 DIAGNOSIS — Z95810 Presence of automatic (implantable) cardiac defibrillator: Secondary | ICD-10-CM | POA: Diagnosis not present

## 2018-02-05 DIAGNOSIS — I5022 Chronic systolic (congestive) heart failure: Secondary | ICD-10-CM

## 2018-02-06 ENCOUNTER — Telehealth: Payer: Self-pay

## 2018-02-06 NOTE — Telephone Encounter (Signed)
Remote ICM transmission received.  Attempted call to patient and left message to return call. 

## 2018-02-06 NOTE — Progress Notes (Signed)
EPIC Encounter for ICM Monitoring  Patient Name: Kristi Parker is a 67 y.o. female Date: 02/06/2018 Primary Care Physican: [name], MD Primary Cardiologist:[place]ains Electrophysiologist: Allred Dry Weight:Previous weight 125lbs (baseline 123-125 lbs) Bi-V Pacing: 99.3%  Clinical Status (03-Jan-2018 to 05-Feb-2018) Treated VT/VF 0 episodes m AT/AF 52 episodes  Time in AT/AF 0.1 hr/day (0.6%)       Attempted call to patient and unable to reach.    Transmission reviewed.    Thoracic impedance normal.  Prescribed dosage: Furosemide 40 mg - Take 40 mg by mouth daily. May take an extra 1/2 tablet by mouth as directed for swelling  Labs: 12/14/2017 Creatinine 1.59, BUN 14, Potassium 4.0, Sodium 142, EGFR 34-39 02/28/2017 Creatinine 1.60, BUN 20, Potassium 4.6, Sodium 140, EGFR 34-39 02/20/2017 Creatinine 1.85, BUN 23, Potassium 4.3, Sodium 141, EGFR 28-32 11/10/2016 Creatinine 1.75, BUN 17, Potassium 4.4, Sodium 141, EGFR 30-35 10/31/2016 Creatinine 2.49, BUN 33, Potassium 4.7, Sodium 134, EGFR 20-23 07/28/2016 Creatinine 1.64, BUN 11, Potassium 3.5, Sodium 140, EGFR 32-37 12/13/2017Creatinine 1.88, BUN 18, Potassium 3.9, Sodium 140, EGFR 27-31  Recommendations: NONE - Unable to reach.  Follow-up plan: ICM clinic phone appointment on 03/13/2018.     Copy of ICM check sent to Dr. Rayann Heman.   3 month ICM trend: 02/05/2018    1 Year ICM trend:       [name], RN 02/06/2018 1:03 PM

## 2018-02-07 ENCOUNTER — Ambulatory Visit: Payer: Medicare Other | Admitting: Pharmacist

## 2018-02-07 DIAGNOSIS — Z5181 Encounter for therapeutic drug level monitoring: Secondary | ICD-10-CM

## 2018-02-07 DIAGNOSIS — I480 Paroxysmal atrial fibrillation: Secondary | ICD-10-CM

## 2018-02-07 LAB — POCT INR: INR: 1.2 — AB (ref 2.0–3.0)

## 2018-02-07 MED ORDER — ENOXAPARIN SODIUM 60 MG/0.6ML ~~LOC~~ SOLN
60.0000 mg | SUBCUTANEOUS | 0 refills | Status: DC
Start: 2018-02-07 — End: 2018-03-21

## 2018-02-07 NOTE — Patient Instructions (Signed)
Description   Take 1 tablet today, 1 tablet tomorrow, and 1/2 tablet on Friday, then continue taking 1/2 tablet daily except nothing on Mondays and Fridays. Continue your Lovenox injections. Recheck INR on Monday. Call our office if you are placed on any new medications or if you have any upcoming procedures [phone].

## 2018-02-12 ENCOUNTER — Ambulatory Visit: Payer: Medicare Other | Admitting: *Deleted

## 2018-02-12 DIAGNOSIS — Z5181 Encounter for therapeutic drug level monitoring: Secondary | ICD-10-CM

## 2018-02-12 DIAGNOSIS — I480 Paroxysmal atrial fibrillation: Secondary | ICD-10-CM | POA: Diagnosis not present

## 2018-02-12 LAB — POCT INR: INR: 1.8 — AB (ref 2.0–3.0)

## 2018-02-12 MED ORDER — WARFARIN SODIUM 2.5 MG PO TABS: 2.5000 mg | ORAL_TABLET | ORAL | 3 refills | Status: DC

## 2018-02-12 NOTE — Patient Instructions (Signed)
Description   These instructions are using your NEW TABLET (2.5MG  MINT GREEN PILL) Today take 1 tablet, then start taking 1 tablet everyday except 1/2 tablet on Fridays and Mondays. Take your last Lovenox injection today then stop use. Recheck INR in 7-10 days. Call our office if you are placed on any new medications or if you have any upcoming procedures [phone].

## 2018-02-19 ENCOUNTER — Other Ambulatory Visit: Payer: Self-pay | Admitting: Cardiology

## 2018-02-22 ENCOUNTER — Ambulatory Visit: Payer: Medicare Other | Admitting: *Deleted

## 2018-02-22 DIAGNOSIS — I480 Paroxysmal atrial fibrillation: Secondary | ICD-10-CM

## 2018-02-22 DIAGNOSIS — Z5181 Encounter for therapeutic drug level monitoring: Secondary | ICD-10-CM | POA: Diagnosis not present

## 2018-02-22 LAB — POCT INR: INR: 1.8 — AB (ref 2.0–3.0)

## 2018-02-22 NOTE — Patient Instructions (Addendum)
Description   Today take 1.5 tablets then start taking 1 tablet everyday except 1/2 tablet on Fridays. Recheck INR in 1 week. Call our office if you are placed on any new medications or if you have any upcoming procedures [phone].

## 2018-02-27 ENCOUNTER — Other Ambulatory Visit: Payer: Self-pay | Admitting: Cardiology

## 2018-03-01 ENCOUNTER — Ambulatory Visit: Payer: Medicare Other | Admitting: Pharmacist

## 2018-03-01 ENCOUNTER — Ambulatory Visit (INDEPENDENT_AMBULATORY_CARE_PROVIDER_SITE_OTHER): Payer: Medicare Other | Admitting: *Deleted

## 2018-03-01 DIAGNOSIS — Z5181 Encounter for therapeutic drug level monitoring: Secondary | ICD-10-CM

## 2018-03-01 DIAGNOSIS — I4892 Unspecified atrial flutter: Secondary | ICD-10-CM | POA: Diagnosis not present

## 2018-03-01 DIAGNOSIS — I428 Other cardiomyopathies: Secondary | ICD-10-CM

## 2018-03-01 LAB — POCT INR: INR: 1.7 — AB (ref 2.0–3.0)

## 2018-03-01 NOTE — Patient Instructions (Signed)
Description   Today take 2 tablets, then start taking 1 tablet daily. Recheck INR in 3 weeks - pt will be out of town until then. Call our office if you are placed on any new medications or if you have any upcoming procedures [phone].

## 2018-03-01 NOTE — Progress Notes (Signed)
Remote ICD transmission.   

## 2018-03-02 ENCOUNTER — Encounter: Payer: Self-pay | Admitting: Cardiology

## 2018-03-08 LAB — CUP PACEART REMOTE DEVICE CHECK
Battery Remaining Longevity: 57 mo
Battery Voltage: 2.98 V
Brady Statistic AP VP Percent: 99.18 %
Brady Statistic AP VS Percent: 0.29 %
Brady Statistic AS VP Percent: 0.51 %
Brady Statistic AS VS Percent: 0.02 %
Brady Statistic RA Percent Paced: 99.38 %
Brady Statistic RV Percent Paced: 99.14 %
Date Time Interrogation Session: 20190718073525
HighPow Impedance: 35 Ohm
Implantable Lead Implant Date: 20091104
Implantable Lead Implant Date: 20091104
Implantable Lead Implant Date: 20171214
Implantable Lead Location: 753858
Implantable Lead Location: 753859
Implantable Lead Location: 753860
Implantable Lead Model: 4196
Implantable Lead Model: 5076
Implantable Pulse Generator Implant Date: 20171214
Lead Channel Impedance Value: 342 Ohm
Lead Channel Impedance Value: 399 Ohm
Lead Channel Impedance Value: 418 Ohm
Lead Channel Impedance Value: 456 Ohm
Lead Channel Impedance Value: 532 Ohm
Lead Channel Impedance Value: 874 Ohm
Lead Channel Pacing Threshold Amplitude: 0.5 V
Lead Channel Pacing Threshold Amplitude: 0.625 V
Lead Channel Pacing Threshold Amplitude: 2 V
Lead Channel Pacing Threshold Pulse Width: 0.4 ms
Lead Channel Pacing Threshold Pulse Width: 0.4 ms
Lead Channel Pacing Threshold Pulse Width: 0.6 ms
Lead Channel Sensing Intrinsic Amplitude: 0.125 mV
Lead Channel Sensing Intrinsic Amplitude: 0.125 mV
Lead Channel Sensing Intrinsic Amplitude: 6.375 mV
Lead Channel Sensing Intrinsic Amplitude: 6.375 mV
Lead Channel Setting Pacing Amplitude: 2 V
Lead Channel Setting Pacing Amplitude: 2.5 V
Lead Channel Setting Pacing Amplitude: 3.5 V
Lead Channel Setting Pacing Pulse Width: 0.4 ms
Lead Channel Setting Pacing Pulse Width: 0.6 ms
Lead Channel Setting Sensing Sensitivity: 0.3 mV

## 2018-03-13 ENCOUNTER — Ambulatory Visit (INDEPENDENT_AMBULATORY_CARE_PROVIDER_SITE_OTHER): Payer: Medicare Other

## 2018-03-13 ENCOUNTER — Telehealth: Payer: Self-pay

## 2018-03-13 DIAGNOSIS — I5022 Chronic systolic (congestive) heart failure: Secondary | ICD-10-CM

## 2018-03-13 DIAGNOSIS — Z95810 Presence of automatic (implantable) cardiac defibrillator: Secondary | ICD-10-CM | POA: Diagnosis not present

## 2018-03-13 NOTE — Telephone Encounter (Signed)
Remote ICM transmission received.  Attempted call to patient and left detailed message, per DPR, regarding transmission and next ICM scheduled for 04/13/2018.  Advised to return call for any fluid symptoms or questions.    

## 2018-03-13 NOTE — Progress Notes (Signed)
EPIC Encounter for ICM Monitoring  Patient Name: Kristi Parker is a 67 y.o. female Date: 03/13/2018 Primary Care Physican: [name], MD Primary Cardiologist:Skains Electrophysiologist: Allred Dry Weight:Previous weight 125lbs (baseline 123-125 lbs) Bi-V Pacing: 99%   Clinical Status (01-Mar-2018 to 13-Mar-2018)  Treated VT/VF 0 episodes   AT/AF 11 episodes   Time in AT/AF <0.1 hr/day (<0.1%)      Attempted call to patient and unable to reach.  Left detailed message, per DPR, regarding transmission.  Transmission reviewed.    Thoracic impedance normal.  Prescribed dosage: Furosemide40 mg- Take 40 mg by mouth daily. May take an extra 1/2 tablet by mouth as directed for swelling  Labs: 12/14/2017 Creatinine 1.59, BUN 14, Potassium 4.0, Sodium 142, EGFR 34-39 02/28/2017 Creatinine 1.60, BUN 20, Potassium 4.6, Sodium 140, EGFR 34-39 02/20/2017 Creatinine 1.85, BUN 23, Potassium 4.3, Sodium 141, EGFR 28-32 11/10/2016 Creatinine 1.75, BUN 17, Potassium 4.4, Sodium 141, EGFR 30-35 10/31/2016 Creatinine 2.49, BUN 33, Potassium 4.7, Sodium 134, EGFR 20-23 07/28/2016 Creatinine 1.64, BUN 11, Potassium 3.5, Sodium 140, EGFR 32-37 12/13/2017Creatinine 1.88, BUN 18, Potassium 3.9, Sodium 140, EGFR 27-31  Recommendations: Left voice mail with ICM number and encouraged to call if experiencing any fluid symptoms.  Follow-up plan: ICM clinic phone appointment on 04/13/2018.   Office appointment scheduled 03/21/2018 with Dr. Haroldine Laws.    Copy of ICM check sent to Dr. Rayann Heman.   3 month ICM trend: 03/13/2018    1 Year ICM trend:       [name], RN 03/13/2018 11:32 AM

## 2018-03-19 ENCOUNTER — Ambulatory Visit: Payer: Medicare Other | Admitting: *Deleted

## 2018-03-19 DIAGNOSIS — I4891 Unspecified atrial fibrillation: Secondary | ICD-10-CM | POA: Diagnosis not present

## 2018-03-19 DIAGNOSIS — Z5181 Encounter for therapeutic drug level monitoring: Secondary | ICD-10-CM | POA: Diagnosis not present

## 2018-03-19 LAB — POCT INR: INR: 2.9 (ref 2.0–3.0)

## 2018-03-19 NOTE — Patient Instructions (Signed)
Description   Continue taking 1 tablet daily. Recheck INR in 3 weeks.  Call our office if you are placed on any new medications or if you have any upcoming procedures [phone].

## 2018-03-21 ENCOUNTER — Ambulatory Visit (HOSPITAL_COMMUNITY)
Admission: RE | Admit: 2018-03-21 | Discharge: 2018-03-21 | Disposition: A | Discharge status: Home / Self Care | Payer: Medicare Other | Source: Ambulatory Visit | Attending: Internal Medicine | Admitting: Internal Medicine

## 2018-03-21 ENCOUNTER — Other Ambulatory Visit: Payer: Self-pay

## 2018-03-21 VITALS — BP 108/65 | HR 88 | Wt 135.5 lb

## 2018-03-21 DIAGNOSIS — I5022 Chronic systolic (congestive) heart failure: Secondary | ICD-10-CM

## 2018-03-21 DIAGNOSIS — I272 Pulmonary hypertension, unspecified: Secondary | ICD-10-CM | POA: Diagnosis not present

## 2018-03-21 DIAGNOSIS — Z79899 Other long term (current) drug therapy: Secondary | ICD-10-CM | POA: Diagnosis not present

## 2018-03-21 DIAGNOSIS — G35 Multiple sclerosis: Secondary | ICD-10-CM | POA: Insufficient documentation

## 2018-03-21 DIAGNOSIS — I429 Cardiomyopathy, unspecified: Secondary | ICD-10-CM | POA: Diagnosis not present

## 2018-03-21 DIAGNOSIS — I340 Nonrheumatic mitral (valve) insufficiency: Secondary | ICD-10-CM | POA: Diagnosis not present

## 2018-03-21 DIAGNOSIS — N184 Chronic kidney disease, stage 4 (severe): Secondary | ICD-10-CM | POA: Insufficient documentation

## 2018-03-21 DIAGNOSIS — Z79890 Hormone replacement therapy (postmenopausal): Secondary | ICD-10-CM | POA: Insufficient documentation

## 2018-03-21 DIAGNOSIS — M5010 Cervical disc disorder with radiculopathy, unspecified cervical region: Secondary | ICD-10-CM | POA: Diagnosis not present

## 2018-03-21 DIAGNOSIS — Z7901 Long term (current) use of anticoagulants: Secondary | ICD-10-CM | POA: Diagnosis not present

## 2018-03-21 DIAGNOSIS — I480 Paroxysmal atrial fibrillation: Secondary | ICD-10-CM | POA: Insufficient documentation

## 2018-03-21 DIAGNOSIS — Z95810 Presence of automatic (implantable) cardiac defibrillator: Secondary | ICD-10-CM | POA: Diagnosis not present

## 2018-03-21 DIAGNOSIS — K219 Gastro-esophageal reflux disease without esophagitis: Secondary | ICD-10-CM | POA: Insufficient documentation

## 2018-03-21 DIAGNOSIS — I428 Other cardiomyopathies: Secondary | ICD-10-CM | POA: Diagnosis not present

## 2018-03-21 DIAGNOSIS — Z8673 Personal history of transient ischemic attack (TIA), and cerebral infarction without residual deficits: Secondary | ICD-10-CM | POA: Diagnosis not present

## 2018-03-21 DIAGNOSIS — M5136 Other intervertebral disc degeneration, lumbar region: Secondary | ICD-10-CM | POA: Insufficient documentation

## 2018-03-21 LAB — BASIC METABOLIC PANEL
Anion gap: 7 (ref 5–15)
BUN: 17 mg/dL (ref 8–23)
CO2: 26 mmol/L (ref 22–32)
Calcium: 9 mg/dL (ref 8.9–10.3)
Chloride: 108 mmol/L (ref 98–111)
Creatinine, Ser: 1.7 mg/dL — ABNORMAL HIGH (ref 0.44–1.00)
GFR calc Af Amer: 35 mL/min — ABNORMAL LOW (ref >60)
GFR calc non Af Amer: 30 mL/min — ABNORMAL LOW (ref >60)
Glucose, Bld: 81 mg/dL (ref 70–99)
Potassium: 4.2 mmol/L (ref 3.5–5.1)
Sodium: 141 mmol/L (ref 135–145)

## 2018-03-21 LAB — BRAIN NATRIURETIC PEPTIDE: B Natriuretic Peptide: 322.8 pg/mL — ABNORMAL HIGH (ref 0.0–100.0)

## 2018-03-21 MED ORDER — SACUBITRIL-VALSARTAN 49-51 MG PO TABS: 1.0000 | ORAL_TABLET | Freq: Two times a day (BID) | ORAL | 3 refills | Status: DC

## 2018-03-21 NOTE — Patient Instructions (Signed)
Increase Furosemide to 40 mg Twice daily FOR 2 DAYS   Increase Entresto to 49/51 mg Twice daily, if you do not tolerate this dose you can decrease back to 24/26 mg Twice daily but DO NOT CUT THE PILLS IN HALF  Labs today  Labs in 2 weeks  Your physician recommends that you schedule a follow-up appointment in: 2 months

## 2018-03-21 NOTE — Progress Notes (Signed)
Advanced Heart Failure Clinic Note   Primary cardiologist: Dr. Marlou Porch EP: Dr. Rayann Heman Referring Physician: Dr Rayann Heman PCP: [name], MD   HPI: Kristi Parker is a 67 y.o. female with history of VT, NICM, Medtronic BiV ICD, pulmonary HTN, CVA 2004, PAF, and CKD III-IV.   She last saw Dr Rayann Heman on 01/03/18 for routine f/u. She reported having more SOB with moderate activity. Her lisinopril was stopped and she was started on Entresto after 36 hour washout period.   She was referred by Dr. Rayann Heman for evaluation and management of her heart failure and possible advanced therapy consideration.   She was first diagnosed with HF in 2013. She is unsure of the cause. She had a heart cath and did not have any blockages. We also follow her son in HF clinic for NICM. She states that multiple family members have severe HF and several have underwent transplant.  Based on CPX testing, I referred her to Dr. Mosetta Pigeon at [facility]. She was seen there on 02/22/18 and they discussed need for advanced therapies including possible heart or heart-kidney transplant. She has f/u with them on 04/25/18. She has also been referred to Dr. Lattie Corns for genetic counseling - she has not seen her yet.   Was on vacation for 2 weeks. Says she feels fine but gets tired easily. Can do ADLs but tires quickly. Struggles with steps. [facility]. Says symptoms are much worse when humid. Can go to the grocery store but has to go slowly. No CP, orthopnea, or PND. Had some LE edema after vacation. Taking extra lasix now to get it off. Weight coming down slowly. Weight up from 125->131 Tries to limit salt. Drinks less than 2 L/day. No dizziness or presyncope.   Recent CPX 12/27/17: Peak VO2 13.5, slope 42  SH: Lives at home with her husband. No ETOH, tobacco, or drugs. Able to afford medications. Used to work for [employer].   FH: She has several family members on her father's side who  have HF, two of which have required heart transplants. Her son also has HF.  Cath 02/02/18 Normal coronaries Ao = 97/55 (72) LV = 99/18 RA = 8 RV = 42/9 PA = 42/17 (27) PCW = 20 ( V waves to 35) Fick cardiac output/index = 5.2/3.3 PVR = 1.4 WU Ao sat = 95% PA sat = 66%, 69% PAPi = 3.1 RA/PCWP = 0.40  CPX 12/27/17: Peak VO2: 13.5 (60% predicted peak VO2) VE/VCO2 slope: 42 OUES: 0.77 Peak RER: 1.19 Ventilatory Threshold: 9.7 (43% predicted and 72% measured peak VO2) Peak RR 29 Peak Ventilation: 37.1 VE/MVV: 57% PETCO2 at peak: 27 O2pulse: 7  (88% predicted O2pulse)  Echo 12/14/17: - Left ventricle: The cavity size was mildly dilated. Wall   thickness was normal. Systolic function was severely reduced. The   estimated ejection fraction was in the range of 20% to 25%.   Diffuse hypokinesis. The study is not technically sufficient to   allow evaluation of LV diastolic function. - Aortic valve: There was trivial regurgitation. - Mitral valve: Calcified annulus. Mildly thickened leaflets .   There was mild to moderate regurgitation. - Left atrium: The atrium was severely dilated. - Right ventricle: Systolic function was mildly reduced. - Right atrium: The atrium was severely dilated. - Atrial septum: The septum bowed from left to right, consistent   with increased left atrial pressure. - Pulmonary arteries: Systolic pressure was moderately to  severely   increased.   Past Medical History:  Diagnosis Date  . Adenomatous colon polyp   . Cervical radiculopathy at C5   . Chronic systolic heart failure ([place])   . CKD (chronic kidney disease), stage III ([place])   . DDD (degenerative disc disease), cervical   . DDD (degenerative disc disease), lumbar   . Fibroid   . GERD (gastroesophageal reflux disease)   . Hearing loss 02/2010   L hearing loss/vertigo, steroids  . History of seizure disorder    Related Hx  . Mitral regurgitation    moderate  . Multiple sclerosis ([place])  1984  . Nonischemic cardiomyopathy ([facility])    Moderate LVEF 35-40% by ECHO 2011, 25-30% by echo 2013  . Permanent atrial fibrillation ([place])   . Pulmonary hypertension, secondary 06/04/2013   As a result of nonischemic cardiomyopathy EF 25-30%  . Stroke [facility])    Right brain CVA, complete recovery 07/2003  . Ventricular tachycardia ([place])   . Vertigo 02/2010   L hearing loss/vertigo, steroids    Current Outpatient Medications  Medication Sig Dispense Refill  . acetaminophen (TYLENOL) 500 MG tablet Take 500-1,000 mg by mouth every 6 (six) hours as needed (for back pain).    . ALPHAGAN P 0.1 % SOLN Place 1 drop into the left eye 2 (two) times daily.    [name] amiodarone (PACERONE) 200 MG tablet Take 0.5 tablets (100 mg total) by mouth daily. 45 tablet 3  . BIOTIN PO Take 1 capsule by mouth daily.    . Calcium Carbonate-Vitamin D (CALCIUM + D PO) Take 1 tablet by mouth daily.     . carvedilol (COREG) 3.125 MG tablet TAKE 1 TABLET BY MOUTH TWICE A DAY 180 tablet 1  . enoxaparin (LOVENOX) 60 MG/0.6ML injection Inject 0.6 mLs (60 mg total) into the skin daily. 5 Syringe 0  . fexofenadine (ALLEGRA) 180 MG tablet Take 180 mg by mouth daily as needed for allergies.     . furosemide (LASIX) 40 MG tablet Take 1 tablet by mouth daily. May take extra 1/2 tablet as needed for swelling. Appt needed for further refills 1st attempt 135 tablet 0  . levothyroxine (SYNTHROID, LEVOTHROID) 50 MCG tablet Take 50 mcg by mouth daily before breakfast.     . loratadine (CLARITIN) 10 MG tablet Take 10 mg by mouth daily as needed for allergies.    [name] MAGNESIUM PO Take 1 tablet by mouth daily.    . NON FORMULARY Apply 1 application topically 2 (two) times daily. [facility]  Onychomycosis Nail Lacquer -  Fluconazole 2%, Terbinafine 1% DMSO Apply to affected nail twice daily Qty. 120 gm 3 refills    . potassium chloride (K-DUR) 10 MEQ tablet TAKE TWO (2) TABLETS BY MOUTH DAILY 180 tablet 2  . sacubitril-valsartan  (ENTRESTO) 24-26 MG Take 1 tablet by mouth 2 (two) times daily. 60 tablet 11  . warfarin (COUMADIN) 2.5 MG tablet Take 1 tablet (2.5 mg total) by mouth as directed. 30 tablet 3   No current facility-administered medications for this visit.     No Known Allergies    Social History   Socioeconomic History  . Marital status: Married    Spouse name: Not on file  . Number of children: Not on file  . Years of education: Not on file  . Highest education level: Not on file  Occupational History  . Not on file  Social Needs  . Financial resource strain: Not on file  . Food insecurity:  Worry: Not on file    Inability: Not on file  . Transportation needs:    Medical: Not on file    Non-medical: Not on file  Tobacco Use  . Smoking status: Never Smoker  . Smokeless tobacco: Never Used  Substance and Sexual Activity  . Alcohol use: Yes    Comment: rare  . Drug use: No  . Sexual activity: Yes    Birth control/protection: Post-menopausal  Lifestyle  . Physical activity:    Days per week: Not on file    Minutes per session: Not on file  . Stress: Not on file  Relationships  . Social connections:    Talks on phone: Not on file    Gets together: Not on file    Attends religious service: Not on file    Active member of club or organization: Not on file    Attends meetings of clubs or organizations: Not on file    Relationship status: Not on file  . Intimate partner violence:    Fear of current or ex partner: Not on file    Emotionally abused: Not on file    Physically abused: Not on file    Forced sexual activity: Not on file  Other Topics Concern  . Not on file  Social History Narrative  . Not on file      Family History  Problem Relation Age of Onset  . Diabetes Mother   . Multiple sclerosis Mother   . Cancer Father        ?  . CAD Unknown        Father's side CAD    Vitals:   03/21/18 1150  BP: 108/65  Pulse: 88  SpO2: 100%  Weight: 135 lb 8 oz (61.5 kg)     Wt Readings from Last 3 Encounters:  02/02/18 127 lb (57.6 kg)  01/12/18 125 lb 8 oz (56.9 kg)  01/03/18 126 lb (57.2 kg)    PHYSICAL EXAM: General:  Well appearing. No resp difficulty HEENT: normal Neck: supple. JVP 7-8  Carotids 2+ bilat; no bruits. No lymphadenopathy or thryomegaly appreciated. Cor: PMI laterally displaced. Regular rate & rhythm. No rubs, gallops or murmurs. Lungs: clear Abdomen: soft, nontender, nondistended. No hepatosplenomegaly. No bruits or masses. Good bowel sounds. Extremities: no cyanosis, clubbing, rash, 1+  edema Neuro: alert & orientedx3, cranial nerves grossly intact. moves all 4 extremities w/o difficulty. Affect pleasant   ECG 01/03/18: AV paced, 82 bpm. Personally reviewed  ASSESSMENT & PLAN:  1. Chronic Systolic Heart Failure due to NICM. S/p Medtronic BiV ICD. Likely familial CM.  - Echo 12/14/17: EF 20-25%, trivial AI, mild to mod MR, mildly reduced RV, severely dilated RA and LA, PA pressures moderately to severely increased.  - CPX 12/27/17: Peak VO2 13.5, slope 42 - LHC 02/02/18 with normal coronaries. RHC showed severe NICM with well compensated hemodynamics. - Stable NYHA class III - Volume status mildly elevated in setting of recent vacation  - Will double lasix for 2 days then continue lasix 40 mg daily  - Increase Entresto to 49/51 BID as tolerated - Continue Coreg 3.125 mg BID - Consider spiro at next visit if creatinine remains stable.  - She has been referred to Dr. Broadus John for genetic testing for familial cardiomyopathy - She has been seen by Dr. Mosetta Pigeon at [facility] and dicussed possible isolated heart transplant, combined heart-kidney transplant or VAD. Both her and her husband are struggling with this situation (understandably) and we went through the  various options in detail. Her creatinine has been running 1.7-2.0 so I would worry about progressive renal failure with isolated OHTx or VAD but we will see. She has f/u with [facility] next month.   - Blood type AB-pos  2. PAF - Continue coumadin per coumadin clinic - Remains in NSR - She is not interested in [place]. No change.   3. CKD, baseline creatinine ~1.7-2.0 - Creatinine stable at 1.70 today  4. MR - Mild to moderate on most recent echo  6. Hx of VT s/p Medtronic BiV ICD - Continue amiodarone 100 mg daily   Total time spent 45 minutes. Over half that time spent discussing above.   Glori Bickers, MD  9:52 PM

## 2018-03-27 ENCOUNTER — Telehealth (HOSPITAL_COMMUNITY): Payer: Self-pay

## 2018-03-27 ENCOUNTER — Encounter (HOSPITAL_COMMUNITY): Payer: Self-pay

## 2018-03-27 NOTE — Telephone Encounter (Signed)
Pt called stating that she has ran out of her Delene Loll and it is not time to refill. Pt is currently taking 4 (49-51) tabs a day. Pt also stated that her son is currently on the same dose but it did not agree with son. Pt would like to know if she can take her son's Entresto until her next refill due to cost? Please advise.

## 2018-03-27 NOTE — Telephone Encounter (Signed)
That would be fine. Please make sure she has refills though

## 2018-03-28 NOTE — Telephone Encounter (Signed)
Left VM for pt 

## 2018-03-30 ENCOUNTER — Other Ambulatory Visit: Payer: Self-pay | Admitting: Cardiology

## 2018-04-05 ENCOUNTER — Ambulatory Visit (HOSPITAL_COMMUNITY)
Admission: RE | Admit: 2018-04-05 | Discharge: 2018-04-05 | Disposition: A | Discharge status: Home / Self Care | Payer: Medicare Other | Source: Ambulatory Visit | Attending: Internal Medicine | Admitting: Internal Medicine

## 2018-04-05 ENCOUNTER — Encounter: Payer: Self-pay | Admitting: Podiatry

## 2018-04-05 ENCOUNTER — Ambulatory Visit: Payer: Medicare Other | Admitting: Podiatry

## 2018-04-05 DIAGNOSIS — I5022 Chronic systolic (congestive) heart failure: Secondary | ICD-10-CM | POA: Diagnosis not present

## 2018-04-05 DIAGNOSIS — M79674 Pain in right toe(s): Secondary | ICD-10-CM

## 2018-04-05 DIAGNOSIS — B351 Tinea unguium: Secondary | ICD-10-CM | POA: Diagnosis not present

## 2018-04-05 DIAGNOSIS — M79675 Pain in left toe(s): Secondary | ICD-10-CM | POA: Diagnosis not present

## 2018-04-05 DIAGNOSIS — D689 Coagulation defect, unspecified: Secondary | ICD-10-CM | POA: Diagnosis not present

## 2018-04-05 LAB — BASIC METABOLIC PANEL
Anion gap: 8 (ref 5–15)
BUN: 23 mg/dL (ref 8–23)
CO2: 26 mmol/L (ref 22–32)
Calcium: 9.2 mg/dL (ref 8.9–10.3)
Chloride: 106 mmol/L (ref 98–111)
Creatinine, Ser: 1.85 mg/dL — ABNORMAL HIGH (ref 0.44–1.00)
GFR calc Af Amer: 32 mL/min — ABNORMAL LOW (ref >60)
GFR calc non Af Amer: 27 mL/min — ABNORMAL LOW (ref >60)
Glucose, Bld: 69 mg/dL — ABNORMAL LOW (ref 70–99)
Potassium: 4.1 mmol/L (ref 3.5–5.1)
Sodium: 140 mmol/L (ref 135–145)

## 2018-04-05 LAB — BRAIN NATRIURETIC PEPTIDE: B Natriuretic Peptide: 268.4 pg/mL — ABNORMAL HIGH (ref 0.0–100.0)

## 2018-04-06 NOTE — Progress Notes (Signed)
Subjective: 68 y.o. returns the office today for painful, elongated, thickened toenails which she cannot trim herself and she has been using a topical antifungal which is been helping her nails some.  Denies any surrounding redness or drainage.  Her nails get sensitive when getting elongated.  She has no new concerns today.  PCP: [name], MD  She is on coumadin  Objective: AAO 3, NAD DP pulses 2/4, PT 1/4, CRT less than 3 seconds- unchanged and denies any claudication symptoms Nails hypertrophic, dystrophic, elongated, brittle, discolored 10.  There is tenderness overlying the nails 1-5 bilaterally. There is no surrounding erythema or drainage along the nail sites.  Overall appears that the color is getting somewhat better to the toenails. No open lesions or pre-ulcerative lesions are identified. No pain with calf compression, swelling, warmth, erythema.  Assessment: Patient presents with symptomatic onychomycosis  Plan: -Treatment options including alternatives, risks, complications were discussed -Nails sharply debrided 10 without complication/bleeding. -Continue topical antifungal. -Discussed daily foot inspection. If there are any changes, to call the office immediately.  -Follow-up in 3 months if she wants for nail debridement or sooner if any problems are to arise. In the meantime, encouraged to call the office with any questions, concerns, changes symptoms.  Celesta Gentile, DPM

## 2018-04-10 ENCOUNTER — Ambulatory Visit: Payer: Medicare Other | Admitting: *Deleted

## 2018-04-10 DIAGNOSIS — Z5181 Encounter for therapeutic drug level monitoring: Secondary | ICD-10-CM | POA: Diagnosis not present

## 2018-04-10 DIAGNOSIS — I4891 Unspecified atrial fibrillation: Secondary | ICD-10-CM

## 2018-04-10 LAB — POCT INR: INR: 2.6 (ref 2.0–3.0)

## 2018-04-10 NOTE — Patient Instructions (Signed)
Description   Continue taking 1 tablet daily. Recheck INR in 4 weeks.  Call our office if you are placed on any new medications or if you have any upcoming procedures [phone].

## 2018-04-11 ENCOUNTER — Encounter (HOSPITAL_COMMUNITY): Payer: Self-pay

## 2018-04-12 ENCOUNTER — Other Ambulatory Visit (HOSPITAL_COMMUNITY): Payer: Self-pay | Admitting: *Deleted

## 2018-04-12 MED ORDER — SACUBITRIL-VALSARTAN 49-51 MG PO TABS: 1.0000 | ORAL_TABLET | Freq: Two times a day (BID) | ORAL | 6 refills | Status: DC

## 2018-04-23 ENCOUNTER — Ambulatory Visit (INDEPENDENT_AMBULATORY_CARE_PROVIDER_SITE_OTHER): Payer: Medicare Other

## 2018-04-23 DIAGNOSIS — Z950 Presence of cardiac pacemaker: Secondary | ICD-10-CM

## 2018-04-23 DIAGNOSIS — I5022 Chronic systolic (congestive) heart failure: Secondary | ICD-10-CM

## 2018-04-23 DIAGNOSIS — Z95810 Presence of automatic (implantable) cardiac defibrillator: Secondary | ICD-10-CM | POA: Diagnosis not present

## 2018-04-24 NOTE — Progress Notes (Signed)
EPIC Encounter for ICM Monitoring  Patient Name: Kristi Parker is a 67 y.o. female Date: 04/24/2018 Primary Care Physican: [name], MD Primary Cardiologist:[name] Electrophysiologist: Allred Dry Weight:133lbs (baseline 123-125 lbs) Bi-V Pacing: 95.1%   Clinical Status (13-Mar-2018 to 23-Apr-2018)  AT/AF 41 episodes  Time in AT/AF <0.1 hr/day (0.2%)   Longest AT/AF 16 minutes      Heart Failure questions reviewed, pt had some ankle swelling but was resolved by taking extra 1/2 tablet of Furosemide.     Thoracic impedance normal.  Prescribed dosage: Furosemide40 mg-Take 1 tablet by mouth daily. May take extra 1/2 tablet as needed for swelling.  Labs: 04/05/2018 Creatinine 1.85, BUN 23, Potassium 4.1, Sodium 140, EGFR 27-32 03/21/2018 Creatinine 1.70, BUN 17, Potassium 4.2, Sodium 141, EGFR 30-35  02/02/2018 Creatinine 1.86, BUN 19, Potassium 4.0, Sodium 140, EGFR 27-31  01/12/2018 Creatinine 2.04, BUN 18, Potassium 4.2, Sodium 141, EGFR 24-28  12/14/2017 Creatinine 1.59, BUN 14, Potassium 4.0, Sodium 142, EGFR 34-39  A complete set of results can be found in Results Review.  Recommendations: No changes.   Encouraged to call for fluid symptoms.  Follow-up plan: ICM clinic phone appointment on 05/31/2018.   Office appointment scheduled 05/23/2018 with Dr. Haroldine Laws.    Copy of ICM check sent to Dr. Rayann Heman.   3 month ICM trend: 04/23/2018    1 Year ICM trend:       [name], RN 04/24/2018 10:57 AM

## 2018-05-02 ENCOUNTER — Encounter (HOSPITAL_COMMUNITY): Payer: Self-pay

## 2018-05-03 ENCOUNTER — Telehealth (HOSPITAL_COMMUNITY): Payer: Self-pay | Admitting: Cardiology

## 2018-05-03 DIAGNOSIS — I5022 Chronic systolic (congestive) heart failure: Secondary | ICD-10-CM

## 2018-05-03 NOTE — Telephone Encounter (Signed)
Per VO Dr Haroldine Laws Patient will need to schedule a CPX and RHC   Left message for patient to verify availability

## 2018-05-07 ENCOUNTER — Encounter (HOSPITAL_COMMUNITY): Payer: Self-pay

## 2018-05-07 ENCOUNTER — Encounter (HOSPITAL_COMMUNITY): Payer: Self-pay | Admitting: Cardiology

## 2018-05-07 ENCOUNTER — Encounter (HOSPITAL_COMMUNITY): Payer: Self-pay | Admitting: *Deleted

## 2018-05-07 NOTE — Progress Notes (Signed)
Pre cath letter

## 2018-05-08 NOTE — Telephone Encounter (Signed)
cpx 10/2 Cath 10/3  Pt aware

## 2018-05-09 ENCOUNTER — Other Ambulatory Visit (HOSPITAL_COMMUNITY): Payer: Self-pay | Admitting: *Deleted

## 2018-05-09 ENCOUNTER — Other Ambulatory Visit: Payer: Self-pay | Admitting: Cardiology

## 2018-05-09 DIAGNOSIS — I5022 Chronic systolic (congestive) heart failure: Secondary | ICD-10-CM

## 2018-05-10 ENCOUNTER — Other Ambulatory Visit: Payer: Self-pay | Admitting: Cardiology

## 2018-05-10 ENCOUNTER — Ambulatory Visit: Payer: Medicare Other | Admitting: *Deleted

## 2018-05-10 DIAGNOSIS — Z5181 Encounter for therapeutic drug level monitoring: Secondary | ICD-10-CM

## 2018-05-10 DIAGNOSIS — I4891 Unspecified atrial fibrillation: Secondary | ICD-10-CM | POA: Diagnosis not present

## 2018-05-10 LAB — POCT INR: INR: 3.1 — AB (ref 2.0–3.0)

## 2018-05-10 NOTE — Patient Instructions (Addendum)
Description   Today only take 1/2 tablet, then Continue taking 1 tablet daily. Follow your cath instructions.  Recheck INR in 11 days.   Call our office if you are placed on any new medications or if you have any upcoming procedures [phone].

## 2018-05-16 ENCOUNTER — Other Ambulatory Visit (HOSPITAL_COMMUNITY): Payer: Medicare Other

## 2018-05-16 ENCOUNTER — Ambulatory Visit (HOSPITAL_COMMUNITY): Payer: Medicare Other | Attending: Internal Medicine

## 2018-05-16 ENCOUNTER — Other Ambulatory Visit (HOSPITAL_COMMUNITY): Payer: Self-pay | Admitting: *Deleted

## 2018-05-16 DIAGNOSIS — I5022 Chronic systolic (congestive) heart failure: Secondary | ICD-10-CM | POA: Insufficient documentation

## 2018-05-17 ENCOUNTER — Encounter (HOSPITAL_COMMUNITY)
Admission: RE | Disposition: A | Discharge status: Home / Self Care | Payer: Self-pay | Source: Ambulatory Visit | Attending: Internal Medicine

## 2018-05-17 ENCOUNTER — Ambulatory Visit (HOSPITAL_COMMUNITY)
Admission: RE | Admit: 2018-05-17 | Discharge: 2018-05-17 | Disposition: A | Discharge status: Home / Self Care | Payer: Medicare Other | Source: Ambulatory Visit | Attending: Internal Medicine | Admitting: Internal Medicine

## 2018-05-17 ENCOUNTER — Other Ambulatory Visit: Payer: Self-pay

## 2018-05-17 DIAGNOSIS — Z95810 Presence of automatic (implantable) cardiac defibrillator: Secondary | ICD-10-CM | POA: Diagnosis not present

## 2018-05-17 DIAGNOSIS — K219 Gastro-esophageal reflux disease without esophagitis: Secondary | ICD-10-CM | POA: Insufficient documentation

## 2018-05-17 DIAGNOSIS — Z8673 Personal history of transient ischemic attack (TIA), and cerebral infarction without residual deficits: Secondary | ICD-10-CM | POA: Insufficient documentation

## 2018-05-17 DIAGNOSIS — I5022 Chronic systolic (congestive) heart failure: Secondary | ICD-10-CM

## 2018-05-17 DIAGNOSIS — I472 Ventricular tachycardia: Secondary | ICD-10-CM | POA: Insufficient documentation

## 2018-05-17 DIAGNOSIS — I4821 Permanent atrial fibrillation: Secondary | ICD-10-CM | POA: Diagnosis not present

## 2018-05-17 DIAGNOSIS — G40909 Epilepsy, unspecified, not intractable, without status epilepticus: Secondary | ICD-10-CM | POA: Diagnosis not present

## 2018-05-17 DIAGNOSIS — Z86010 Personal history of colonic polyps: Secondary | ICD-10-CM | POA: Insufficient documentation

## 2018-05-17 DIAGNOSIS — I428 Other cardiomyopathies: Secondary | ICD-10-CM | POA: Diagnosis not present

## 2018-05-17 DIAGNOSIS — Z8249 Family history of ischemic heart disease and other diseases of the circulatory system: Secondary | ICD-10-CM | POA: Insufficient documentation

## 2018-05-17 DIAGNOSIS — I272 Pulmonary hypertension, unspecified: Secondary | ICD-10-CM | POA: Insufficient documentation

## 2018-05-17 DIAGNOSIS — Z7901 Long term (current) use of anticoagulants: Secondary | ICD-10-CM | POA: Diagnosis not present

## 2018-05-17 DIAGNOSIS — N184 Chronic kidney disease, stage 4 (severe): Secondary | ICD-10-CM | POA: Diagnosis not present

## 2018-05-17 DIAGNOSIS — H9192 Unspecified hearing loss, left ear: Secondary | ICD-10-CM | POA: Diagnosis not present

## 2018-05-17 DIAGNOSIS — I051 Rheumatic mitral insufficiency: Secondary | ICD-10-CM | POA: Diagnosis not present

## 2018-05-17 DIAGNOSIS — G35 Multiple sclerosis: Secondary | ICD-10-CM | POA: Diagnosis not present

## 2018-05-17 HISTORY — PX: RIGHT HEART CATH: CATH118263

## 2018-05-17 LAB — POCT I-STAT 3, VENOUS BLOOD GAS (G3P V)
Acid-base deficit: 3 mmol/L — ABNORMAL HIGH (ref 0.0–2.0)
Acid-base deficit: 5 mmol/L — ABNORMAL HIGH (ref 0.0–2.0)
Bicarbonate: 19.9 mmol/L — ABNORMAL LOW (ref 20.0–28.0)
Bicarbonate: 21.7 mmol/L (ref 20.0–28.0)
O2 Saturation: 62 %
O2 Saturation: 63 %
TCO2: 21 mmol/L — ABNORMAL LOW (ref 22–32)
TCO2: 23 mmol/L (ref 22–32)
pCO2, Ven: 35 mmHg — ABNORMAL LOW (ref 44.0–60.0)
pCO2, Ven: 38.1 mmHg — ABNORMAL LOW (ref 44.0–60.0)
pH, Ven: 7.363 (ref 7.250–7.430)
pH, Ven: 7.363 (ref 7.250–7.430)
pO2, Ven: 33 mmHg (ref 32.0–45.0)
pO2, Ven: 34 mmHg (ref 32.0–45.0)

## 2018-05-17 LAB — CBC
HCT: 32.5 % — ABNORMAL LOW (ref 36.0–46.0)
Hemoglobin: 10 g/dL — ABNORMAL LOW (ref 12.0–15.0)
MCH: 28.6 pg (ref 26.0–34.0)
MCHC: 30.8 g/dL (ref 30.0–36.0)
MCV: 92.9 fL (ref 78.0–100.0)
Platelets: 133 10*3/uL — ABNORMAL LOW (ref 150–400)
RBC: 3.5 MIL/uL — ABNORMAL LOW (ref 3.87–5.11)
RDW: 14.6 % (ref 11.5–15.5)
WBC: 3.5 10*3/uL — ABNORMAL LOW (ref 4.0–10.5)

## 2018-05-17 LAB — BASIC METABOLIC PANEL
Anion gap: 6 (ref 5–15)
BUN: 22 mg/dL (ref 8–23)
CO2: 24 mmol/L (ref 22–32)
Calcium: 9.1 mg/dL (ref 8.9–10.3)
Chloride: 111 mmol/L (ref 98–111)
Creatinine, Ser: 1.66 mg/dL — ABNORMAL HIGH (ref 0.44–1.00)
GFR calc Af Amer: 36 mL/min — ABNORMAL LOW (ref >60)
GFR calc non Af Amer: 31 mL/min — ABNORMAL LOW (ref >60)
Glucose, Bld: 94 mg/dL (ref 70–99)
Potassium: 4.2 mmol/L (ref 3.5–5.1)
Sodium: 141 mmol/L (ref 135–145)

## 2018-05-17 LAB — PROTIME-INR
INR: 2.22
Prothrombin Time: 24.5 seconds — ABNORMAL HIGH (ref 11.4–15.2)

## 2018-05-17 SURGERY — RIGHT HEART CATH
Anesthesia: LOCAL

## 2018-05-17 MED ORDER — ACETAMINOPHEN 325 MG PO TABS: 650.0000 mg | ORAL_TABLET | ORAL | Status: DC | PRN

## 2018-05-17 MED ORDER — SODIUM CHLORIDE 0.9 % IV SOLN: 250.0000 mL | INTRAVENOUS | Status: DC | PRN

## 2018-05-17 MED ORDER — HEPARIN (PORCINE) IN NACL 1000-0.9 UT/500ML-% IV SOLN
INTRAVENOUS | Status: AC
  Filled 2018-05-17: qty 500

## 2018-05-17 MED ORDER — LIDOCAINE HCL (PF) 1 % IJ SOLN
INTRAMUSCULAR | Status: AC
  Filled 2018-05-17: qty 30

## 2018-05-17 MED ORDER — ASPIRIN 81 MG PO CHEW
81.0000 mg | CHEWABLE_TABLET | ORAL | Status: AC
  Administered 2018-05-17: 81 mg via ORAL
  Filled 2018-05-17: qty 1

## 2018-05-17 MED ORDER — SODIUM CHLORIDE 0.9% FLUSH: 3.0000 mL | Freq: Two times a day (BID) | INTRAVENOUS | Status: DC

## 2018-05-17 MED ORDER — LIDOCAINE HCL (PF) 1 % IJ SOLN
INTRAMUSCULAR | Status: DC | PRN
  Administered 2018-05-17: 2 mL

## 2018-05-17 MED ORDER — HEPARIN (PORCINE) IN NACL 1000-0.9 UT/500ML-% IV SOLN
INTRAVENOUS | Status: DC | PRN
  Administered 2018-05-17: 500 mL

## 2018-05-17 MED ORDER — SODIUM CHLORIDE 0.9% FLUSH: 3.0000 mL | INTRAVENOUS | Status: DC | PRN

## 2018-05-17 MED ORDER — SODIUM CHLORIDE 0.9 % IV SOLN
INTRAVENOUS | Status: DC
  Administered 2018-05-17: 11:00:00 via INTRAVENOUS

## 2018-05-17 MED ORDER — ONDANSETRON HCL 4 MG/2ML IJ SOLN: 4.0000 mg | Freq: Four times a day (QID) | INTRAMUSCULAR | Status: DC | PRN

## 2018-05-17 SURGICAL SUPPLY — 9 items
CATH BALLN WEDGE 5F 110CM (CATHETERS) ×1 IMPLANT
PACK CARDIAC CATHETERIZATION (CUSTOM PROCEDURE TRAY) ×2 IMPLANT
PROTECTION STATION PRESSURIZED (MISCELLANEOUS) ×2
SHEATH GLIDE SLENDER 4/5FR (SHEATH) ×1 IMPLANT
STATION PROTECTION PRESSURIZED (MISCELLANEOUS) IMPLANT
TRANSDUCER W/STOPCOCK (MISCELLANEOUS) ×2 IMPLANT
TUBING ART PRESS 72  MALE/FEM (TUBING) ×1
TUBING ART PRESS 72 MALE/FEM (TUBING) IMPLANT
WIRE EMERALD 3MM-J .025X260CM (WIRE) ×1 IMPLANT

## 2018-05-17 NOTE — H&P (Signed)
Advanced Heart Failure Clinic Note   Primary cardiologist: Dr. Marlou Porch EP: Dr. Rayann Heman Referring Physician: Dr Rayann Heman PCP: [name], MD   HPI: Kristi Parker is a 67 y.o. female with history of VT, NICM, Medtronic BiV ICD, pulmonary HTN, CVA 2004, PAF, and CKD III-IV.   She last saw Dr Rayann Heman on 01/03/18 for routine f/u. She reported having more SOB with moderate activity. Her lisinopril was stopped and she was started on Entresto after 36 hour washout period.   She was referred by Dr. Rayann Heman for evaluation and management of her heart failure and possible advanced therapy consideration.   She was first diagnosed with HF in 2013. She is unsure of the cause. She had a heart cath and did not have any blockages. We also follow her son in HF clinic for NICM. She states that multiple family members have severe HF and several have underwent transplant.  Based on CPX testing, I referred her to Dr. Mosetta Pigeon at [facility]. She was seen there on 02/22/18 and they discussed need for advanced therapies including possible heart or heart-kidney transplant. She has f/u with them on 04/25/18. She has also been referred to Dr. Lattie Corns for genetic counseling - she has not seen her yet.   Recently had repeat CPX with low Vo2 and high slope. Continues with NYHA III-IIIb symptoms with fatigue and SOB with ADLs. We have been asked by [facility] to repeat RHC as part of the transplant w/u.   SH: Lives at home with her husband. No ETOH, tobacco, or drugs. Able to afford medications. Used to work for [employer].   FH: She has several family members on her father's side who have HF, two of which have required heart transplants. Her son also has HF.  Cath 02/02/18 Normal coronaries Ao = 97/55 (72) LV = 99/18 RA = 8 RV = 42/9 PA = 42/17 (27) PCW = 20 ( V waves to 35) Fick cardiac output/index = 5.2/3.3 PVR = 1.4 WU Ao sat = 95% PA sat = 66%, 69% PAPi = 3.1 RA/PCWP = 0.40  CPX 12/27/17: Peak VO2:  13.5 (60% predicted peak VO2) VE/VCO2 slope: 42 OUES: 0.77 Peak RER: 1.19 Ventilatory Threshold: 9.7 (43% predicted and 72% measured peak VO2) Peak RR 29 Peak Ventilation: 37.1 VE/MVV: 57% PETCO2 at peak: 27 O2pulse: 7  (88% predicted O2pulse)  Echo 12/14/17: - Left ventricle: The cavity size was mildly dilated. Wall   thickness was normal. Systolic function was severely reduced. The   estimated ejection fraction was in the range of 20% to 25%.   Diffuse hypokinesis. The study is not technically sufficient to   allow evaluation of LV diastolic function. - Aortic valve: There was trivial regurgitation. - Mitral valve: Calcified annulus. Mildly thickened leaflets .   There was mild to moderate regurgitation. - Left atrium: The atrium was severely dilated. - Right ventricle: Systolic function was mildly reduced. - Right atrium: The atrium was severely dilated. - Atrial septum: The septum bowed from left to right, consistent   with increased left atrial pressure. - Pulmonary arteries: Systolic pressure was moderately to severely   increased.   Past Medical History:  Diagnosis Date  . Adenomatous colon polyp   . Cervical radiculopathy at C5   . Chronic systolic heart failure ([place])   . CKD (chronic kidney disease), stage III ([place])   . DDD (degenerative disc disease), cervical   . DDD (degenerative disc disease), lumbar   . Fibroid   .  GERD (gastroesophageal reflux disease)   . Hearing loss 02/2010   L hearing loss/vertigo, steroids  . History of seizure disorder    Related Hx  . Mitral regurgitation    moderate  . Multiple sclerosis ([place]) 1984  . Nonischemic cardiomyopathy (HCC)    Moderate LVEF 35-40% by ECHO 2011, 25-30% by echo 2013  . Permanent atrial fibrillation ([place])   . Pulmonary hypertension, secondary 06/04/2013   As a result of nonischemic cardiomyopathy EF 25-30%  . Stroke [facility])    Right brain CVA, complete recovery 07/2003  . Ventricular tachycardia  ([place])   . Vertigo 02/2010   L hearing loss/vertigo, steroids    Current Facility-Administered Medications  Medication Dose Route Frequency Provider Last Rate Last Dose  . 0.9 %  sodium chloride infusion  250 mL Intravenous PRN Kincade Granberg, Shaune Pascal, MD      . 0.9 %  sodium chloride infusion   Intravenous Continuous Kionna Brier, Shaune Pascal, MD 10 mL/hr at 05/17/18 1059    . sodium chloride flush (NS) 0.9 % injection 3 mL  3 mL Intravenous Q12H Nnenna Meador R, MD      . sodium chloride flush (NS) 0.9 % injection 3 mL  3 mL Intravenous PRN Veralyn Lopp, Shaune Pascal, MD        No Known Allergies    Social History   Socioeconomic History  . Marital status: Married    Spouse name: Not on file  . Number of children: Not on file  . Years of education: Not on file  . Highest education level: Not on file  Occupational History  . Not on file  Social Needs  . Financial resource strain: Not on file  . Food insecurity:    Worry: Not on file    Inability: Not on file  . Transportation needs:    Medical: Not on file    Non-medical: Not on file  Tobacco Use  . Smoking status: Never Smoker  . Smokeless tobacco: Never Used  Substance and Sexual Activity  . Alcohol use: Yes    Comment: rare  . Drug use: No  . Sexual activity: Yes    Birth control/protection: Post-menopausal  Lifestyle  . Physical activity:    Days per week: Not on file    Minutes per session: Not on file  . Stress: Not on file  Relationships  . Social connections:    Talks on phone: Not on file    Gets together: Not on file    Attends religious service: Not on file    Active member of club or organization: Not on file    Attends meetings of clubs or organizations: Not on file    Relationship status: Not on file  . Intimate partner violence:    Fear of current or ex partner: Not on file    Emotionally abused: Not on file    Physically abused: Not on file    Forced sexual activity: Not on file  Other Topics Concern  .  Not on file  Social History Narrative  . Not on file      Family History  Problem Relation Age of Onset  . Diabetes Mother   . Multiple sclerosis Mother   . Cancer Father        ?  . CAD Unknown        Father's side CAD    Vitals:   05/17/18 1009  BP: 108/60  Pulse: 77  Resp: 18  Temp: 97.8 F (36.6  C)  TempSrc: Oral  SpO2: 100%  Weight: 60.8 kg  Height: 5' 3.25" (1.607 m)   Wt Readings from Last 3 Encounters:  05/17/18 60.8 kg  03/21/18 61.5 kg  02/02/18 57.6 kg    PHYSICAL EXAM: General:  Well appearing. No resp difficulty HEENT: normal Neck: supple. JVP 6. Carotids 2+ bilat; no bruits. No lymphadenopathy or thryomegaly appreciated. Cor: PMI laterally displaced. Regular rate & rhythm. +s3 Lungs: clear Abdomen: soft, nontender, nondistended. No hepatosplenomegaly. No bruits or masses. Good bowel sounds. Extremities: no cyanosis, clubbing, rash, tr edema Neuro: alert & orientedx3, cranial nerves grossly intact. moves all 4 extremities w/o difficulty. Affect pleasant  ECG 01/03/18: AV paced, 82 bpm. Personally reviewed  ASSESSMENT & PLAN:  1. Chronic Systolic Heart Failure due to NICM. S/p Medtronic BiV ICD. Likely familial CM.  - Echo 12/14/17: EF 20-25%, trivial AI, mild to mod MR, mildly reduced RV, severely dilated RA and LA, PA pressures moderately to severely increased.  - CPX 12/27/17: Peak VO2 13.5, slope 42 - CPX 05/16/18: pVO2 12.1, slope 48 - LHC 02/02/18 with normal coronaries. RHC showed severe NICM with well compensated hemodynamics. - Progressive NYHA class IIIB - She has been seen by Dr. Mosetta Pigeon at [place] [facility] [place] today as part of transplant w/u.  - Blood type AB-pos  2. PAF - Continue coumadin per coumadin clinic - Remains in NSR on amio 100 daily - She is not interested in NOAC. No change.   3. CKD, baseline creatinine ~1.7-2.0 - Creatinine today stable 1.66  4. MR - Mild to moderate on most recent echo  6. Hx of VT s/p Medtronic BiV  ICD - Continue amiodarone 100 mg daily    Glori Bickers, MD  12:30 PM

## 2018-05-17 NOTE — Discharge Instructions (Signed)
Site Care Refer to this sheet in the next few weeks. These instructions provide you with information about caring for yourself after your procedure. Your health care provider may also give you more specific instructions. Your treatment has been planned according to current medical practices, but problems sometimes occur. Call your health care provider if you have any problems or questions after your procedure. What can I expect after the procedure? After your procedure, it is typical to have the following:  Bruising at the radial site that usually fades within 1-2 weeks.  Blood collecting in the tissue (hematoma) that may be painful to the touch. It should usually decrease in size and tenderness within 1-2 weeks.  Follow these instructions at home:  Take medicines only as directed by your health care provider.  You may shower 24 hours after the procedure or as directed by your health care provider. Remove the bandage (dressing) and gently wash the site with plain soap and water. Pat the area dry with a clean towel. Do not rub the site, because this may cause bleeding.  Do not take baths, swim, or use a hot tub until your health care provider approves.  Check your insertion site every day for redness, swelling, or drainage.  Do not apply powder or lotion to the site.  Do not push or pull heavy objects with the affected arm for 24 hours or as directed by your health care provider.  Do not lift over 10 lb (4.5 kg) for 5 days after your procedure or as directed by your health care provider.  Ask your health care provider when it is okay to: ? Return to work or school. ? Resume usual physical activities or sports. ? Resume sexual activity.  Do not drive home if you are discharged the same day as the procedure. Have someone else drive you.  You may drive 24 hours after the procedure unless otherwise instructed by your health care provider.  Do not operate machinery or power tools for 24  hours after the procedure.  If your procedure was done as an outpatient procedure, which means that you went home the same day as your procedure, a responsible adult should be with you for the first 24 hours after you arrive home.  Keep all follow-up visits as directed by your health care provider. This is important. Contact a health care provider if:  You have a fever.  You have chills.  You have increased bleeding from the radial site. Hold pressure on the site. Get help right away if:  You have unusual pain at the radial site.  You have redness, warmth, or swelling at the radial site.  You have drainage (other than a small amount of blood on the dressing) from the radial site.  The radial site is bleeding, and the bleeding does not stop after 30 minutes of holding steady pressure on the site.  Your arm or hand becomes pale, cool, tingly, or numb. This information is not intended to replace advice given to you by your health care provider. Make sure you discuss any questions you have with your health care provider. Document Released: 09/03/2010 Document Revised: 01/07/2016 Document Reviewed: 02/17/2014 Elsevier Interactive Patient Education  2018 [employer].

## 2018-05-18 ENCOUNTER — Encounter (HOSPITAL_COMMUNITY): Payer: Self-pay | Admitting: Internal Medicine

## 2018-05-23 ENCOUNTER — Ambulatory Visit (HOSPITAL_COMMUNITY)
Admission: RE | Admit: 2018-05-23 | Discharge: 2018-05-23 | Disposition: A | Discharge status: Home / Self Care | Payer: Medicare Other | Source: Ambulatory Visit | Attending: Internal Medicine | Admitting: Internal Medicine

## 2018-05-23 ENCOUNTER — Other Ambulatory Visit: Payer: Self-pay

## 2018-05-23 VITALS — BP 110/69 | HR 80 | Wt 135.6 lb

## 2018-05-23 DIAGNOSIS — I472 Ventricular tachycardia: Secondary | ICD-10-CM | POA: Insufficient documentation

## 2018-05-23 DIAGNOSIS — G35 Multiple sclerosis: Secondary | ICD-10-CM | POA: Insufficient documentation

## 2018-05-23 DIAGNOSIS — I340 Nonrheumatic mitral (valve) insufficiency: Secondary | ICD-10-CM | POA: Diagnosis not present

## 2018-05-23 DIAGNOSIS — I4821 Permanent atrial fibrillation: Secondary | ICD-10-CM | POA: Diagnosis not present

## 2018-05-23 DIAGNOSIS — Z8249 Family history of ischemic heart disease and other diseases of the circulatory system: Secondary | ICD-10-CM | POA: Insufficient documentation

## 2018-05-23 DIAGNOSIS — Z79899 Other long term (current) drug therapy: Secondary | ICD-10-CM | POA: Insufficient documentation

## 2018-05-23 DIAGNOSIS — N184 Chronic kidney disease, stage 4 (severe): Secondary | ICD-10-CM | POA: Insufficient documentation

## 2018-05-23 DIAGNOSIS — I5022 Chronic systolic (congestive) heart failure: Secondary | ICD-10-CM | POA: Diagnosis present

## 2018-05-23 DIAGNOSIS — Z95810 Presence of automatic (implantable) cardiac defibrillator: Secondary | ICD-10-CM | POA: Diagnosis not present

## 2018-05-23 DIAGNOSIS — Z7901 Long term (current) use of anticoagulants: Secondary | ICD-10-CM | POA: Insufficient documentation

## 2018-05-23 DIAGNOSIS — I428 Other cardiomyopathies: Secondary | ICD-10-CM | POA: Insufficient documentation

## 2018-05-23 DIAGNOSIS — Z79890 Hormone replacement therapy (postmenopausal): Secondary | ICD-10-CM | POA: Diagnosis not present

## 2018-05-23 DIAGNOSIS — K219 Gastro-esophageal reflux disease without esophagitis: Secondary | ICD-10-CM | POA: Insufficient documentation

## 2018-05-23 DIAGNOSIS — G40909 Epilepsy, unspecified, not intractable, without status epilepticus: Secondary | ICD-10-CM | POA: Insufficient documentation

## 2018-05-23 DIAGNOSIS — Z8673 Personal history of transient ischemic attack (TIA), and cerebral infarction without residual deficits: Secondary | ICD-10-CM | POA: Diagnosis not present

## 2018-05-23 DIAGNOSIS — I272 Pulmonary hypertension, unspecified: Secondary | ICD-10-CM | POA: Insufficient documentation

## 2018-05-23 LAB — BASIC METABOLIC PANEL
Anion gap: 9 (ref 5–15)
BUN: 25 mg/dL — ABNORMAL HIGH (ref 8–23)
CO2: 23 mmol/L (ref 22–32)
Calcium: 9.3 mg/dL (ref 8.9–10.3)
Chloride: 109 mmol/L (ref 98–111)
Creatinine, Ser: 1.67 mg/dL — ABNORMAL HIGH (ref 0.44–1.00)
GFR calc Af Amer: 36 mL/min — ABNORMAL LOW (ref >60)
GFR calc non Af Amer: 31 mL/min — ABNORMAL LOW (ref >60)
Glucose, Bld: 88 mg/dL (ref 70–99)
Potassium: 4 mmol/L (ref 3.5–5.1)
Sodium: 141 mmol/L (ref 135–145)

## 2018-05-23 NOTE — Patient Instructions (Signed)
Lab done today  [facility] will contact you about a follow up appointment  Your physician recommends that you schedule a follow-up appointment in: 2-3 months

## 2018-05-23 NOTE — Progress Notes (Signed)
Advanced Heart Failure Clinic Note   Primary cardiologist: Dr. Marlou Porch EP: Dr. Rayann Heman Referring Physician: Dr Rayann Heman PCP: [name], MD   HPI: Kristi Parker is a 67 y.o. female with history of VT, NICM, Medtronic BiV ICD, pulmonary HTN, CVA 2004, PAF, and CKD III-IV.   She last saw Dr Rayann Heman on 01/03/18 for routine f/u. She reported having more SOB with moderate activity. Her lisinopril was stopped and she was started on Entresto after 36 hour washout period.   She was referred by Dr. Rayann Heman for evaluation and management of her heart failure and possible advanced therapy consideration.   She was first diagnosed with HF in 2013. She is unsure of the cause. She had a heart cath and did not have any blockages. We also follow her son in HF clinic for NICM. She states that multiple family members have severe HF and several have underwent transplant.  Based on CPX testing, Dr Haroldine Laws referred her to Dr. Mosetta Pigeon at [facility]. She was seen there on 02/22/18 and they discussed need for advanced therapies including possible heart or heart-kidney transplant. She has f/u with them on 04/25/18. She has also been referred to Dr. Lattie Corns for genetic counseling - she has not seen her yet.   Recently had repeat CPX with low Vo2 (13.5) and high slope (42). Repeat RHC done on 10/3 as below as part of transplant work up at [facility] showed elevated volume with CI 2.7  She returns today for HF follow up. Overall doing about the same. Still SOB and fatigued with mild exertion, but able to get around her house okay. No problems with ADLs. She took extra lasix for 2 days after cath and volume status improved. No longer had edema in ankles. No orthopnea or PND. No cough, fever, or chills. No dizziness, palpitations, or dizziness. No bleeding on coumadin. Taking all medications, except accidentally took coumadin twice on Monday. Weights 130-132 lbs at home. She has not yet heard from Dr Mosetta Pigeon.  SH: Lives at home with  her husband. No ETOH, tobacco, or drugs. Able to afford medications. Used to work for [employer].   FH: She has several family members on her father's side who have HF, two of which have required heart transplants. Her son also has HF.  [place] 05/17/18: RA = 12 RV = 53/12 PA = 51/22 (34) PCW = mean 28 (v = 35-40) Fick cardiac output/index = 4.4/2.7 PVR = 1.4 WU FA sat = 98% PA sat = 62%, 63% PAPi = 2.4 RA/PCWP = 0.42 Assessment: 1. NICM with elevated filling pressures and relatively preserved output 2. Prominent v-waves in PCWP tracing suggestive of significant MR  Plan/Discussion: Increase diuretics. Continue transplant w/u based on CPX results.   Cath 02/02/18 Normal coronaries Ao = 97/55 (72) LV = 99/18 RA = 8 RV = 42/9 PA = 42/17 (27) PCW = 20 ( V waves to 35) Fick cardiac output/index = 5.2/3.3 PVR = 1.4 WU Ao sat = 95% PA sat = 66%, 69% PAPi = 3.1 RA/PCWP = 0.40  CPX 12/27/17: Peak VO2: 13.5 (60% predicted peak VO2) VE/VCO2 slope: 42 OUES: 0.77 Peak RER: 1.19 Ventilatory Threshold: 9.7 (43% predicted and 72% measured peak VO2) Peak RR 29 Peak Ventilation: 37.1 VE/MVV: 57% PETCO2 at peak: 27 O2pulse: 7  (88% predicted O2pulse)  Echo 12/14/17: - Left ventricle: The cavity size was mildly dilated. Wall   thickness was normal. Systolic function was severely reduced. The   estimated ejection fraction  was in the range of 20% to 25%.   Diffuse hypokinesis. The study is not technically sufficient to   allow evaluation of LV diastolic function. - Aortic valve: There was trivial regurgitation. - Mitral valve: Calcified annulus. Mildly thickened leaflets .   There was mild to moderate regurgitation. - Left atrium: The atrium was severely dilated. - Right ventricle: Systolic function was mildly reduced. - Right atrium: The atrium was severely dilated. - Atrial septum: The septum bowed from left to right, consistent   with increased left atrial  pressure. - Pulmonary arteries: Systolic pressure was moderately to severely   increased.  Review of systems complete and found to be negative unless listed in HPI.   Past Medical History:  Diagnosis Date  . Adenomatous colon polyp   . Cervical radiculopathy at C5   . Chronic systolic heart failure ([place])   . CKD (chronic kidney disease), stage III ([place])   . DDD (degenerative disc disease), cervical   . DDD (degenerative disc disease), lumbar   . Fibroid   . GERD (gastroesophageal reflux disease)   . Hearing loss 02/2010   L hearing loss/vertigo, steroids  . History of seizure disorder    Related Hx  . Mitral regurgitation    moderate  . Multiple sclerosis ([place]) 1984  . Nonischemic cardiomyopathy (HCC)    Moderate LVEF 35-40% by ECHO 2011, 25-30% by echo 2013  . Permanent atrial fibrillation   . Pulmonary hypertension, secondary 06/04/2013   As a result of nonischemic cardiomyopathy EF 25-30%  . Stroke [facility])    Right brain CVA, complete recovery 07/2003  . Ventricular tachycardia ([place])   . Vertigo 02/2010   L hearing loss/vertigo, steroids    Current Outpatient Medications  Medication Sig Dispense Refill  . acetaminophen (TYLENOL) 500 MG tablet Take 500-1,000 mg by mouth every 6 (six) hours as needed (for back pain).    . ALPHAGAN P 0.1 % SOLN Place 1 drop into both eyes 2 (two) times daily.     [name] amiodarone (PACERONE) 200 MG tablet Take 0.5 tablets (100 mg total) by mouth daily. 45 tablet 3  . Biotin 2500 MCG CAPS Take 2,500 mcg by mouth at bedtime.    . Calcium Carbonate-Vitamin D (CALCIUM + D PO) Take 1 tablet by mouth every evening.     . carvedilol (COREG) 3.125 MG tablet TAKE 1 TABLET BY MOUTH TWICE A DAY 180 tablet 1  . fexofenadine (ALLEGRA) 180 MG tablet Take 180 mg by mouth daily as needed for allergies.     . furosemide (LASIX) 40 MG tablet Take 1 tablet by mouth daily. May take extra 1/2 tablet as needed for swelling. Appt needed for further refills 1st attempt  135 tablet 0  . levothyroxine (SYNTHROID, LEVOTHROID) 50 MCG tablet Take 50 mcg by mouth daily before breakfast.     . loratadine (CLARITIN) 10 MG tablet Take 10 mg by mouth daily as needed for allergies.    . Magnesium 500 MG TABS Take 500 mg by mouth at bedtime.    . NON FORMULARY Apply 1 application topically 2 (two) times daily. [facility]  Onychomycosis Nail Lacquer -  Fluconazole 2%, Terbinafine 1% DMSO Apply to affected nail twice daily Qty. 120 gm 3 refills    . potassium chloride (K-DUR) 10 MEQ tablet TAKE TWO (2) TABLETS BY MOUTH DAILY (Patient taking differently: Take 20 mEq by mouth daily. ) 180 tablet 2  . sacubitril-valsartan (ENTRESTO) 49-51 MG Take 1 tablet by mouth 2 (  two) times daily. 60 tablet 6  . warfarin (COUMADIN) 2.5 MG tablet TAKE 1 TABLET (2.5 MG TOTAL) BY MOUTH AS DIRECTED. 90 tablet 0   No current facility-administered medications for this encounter.     No Known Allergies    Social History   Socioeconomic History  . Marital status: Married    Spouse name: Not on file  . Number of children: Not on file  . Years of education: Not on file  . Highest education level: Not on file  Occupational History  . Not on file  Social Needs  . Financial resource strain: Not on file  . Food insecurity:    Worry: Not on file    Inability: Not on file  . Transportation needs:    Medical: Not on file    Non-medical: Not on file  Tobacco Use  . Smoking status: Never Smoker  . Smokeless tobacco: Never Used  Substance and Sexual Activity  . Alcohol use: Yes    Comment: rare  . Drug use: No  . Sexual activity: Yes    Birth control/protection: Post-menopausal  Lifestyle  . Physical activity:    Days per week: Not on file    Minutes per session: Not on file  . Stress: Not on file  Relationships  . Social connections:    Talks on phone: Not on file    Gets together: Not on file    Attends religious service: Not on file    Active member of club or  organization: Not on file    Attends meetings of clubs or organizations: Not on file    Relationship status: Not on file  . Intimate partner violence:    Fear of current or ex partner: Not on file    Emotionally abused: Not on file    Physically abused: Not on file    Forced sexual activity: Not on file  Other Topics Concern  . Not on file  Social History Narrative  . Not on file      Family History  Problem Relation Age of Onset  . Diabetes Mother   . Multiple sclerosis Mother   . Cancer Father        ?  . CAD Unknown        Father's side CAD    Vitals:   05/23/18 1025  BP: 110/69  Pulse: 80  SpO2: 100%  Weight: 61.5 kg (135 lb 9.6 oz)   Wt Readings from Last 3 Encounters:  05/23/18 61.5 kg (135 lb 9.6 oz)  05/17/18 60.8 kg (134 lb)  03/21/18 61.5 kg (135 lb 8 oz)    PHYSICAL EXAM: General: Well appearing. No resp difficulty. HEENT: Normal Neck: Supple. JVP flat. Carotids 2+ bilat; no bruits. No thyromegaly or nodule noted. Cor: PMI laterally displaced. RRR, no murmurs, rubs, gallops Lungs: CTAB, normal effort. Abdomen: Soft, non-tender, non-distended, no HSM. No bruits or masses. +BS  Extremities: No cyanosis, clubbing, or rash. R and LLE no edema.  Neuro: Alert & orientedx3, cranial nerves grossly intact. moves all 4 extremities w/o difficulty. Affect pleasant   ASSESSMENT & PLAN:  1. Chronic Systolic Heart Failure due to NICM. S/p Medtronic BiV ICD. Likely familial CM.  - Echo 12/14/17: EF 20-25%, trivial AI, mild to mod MR, mildly reduced RV, severely dilated RA and LA, PA pressures moderately to severely increased.  - CPX 12/27/17: Peak VO2 13.5, slope 42 - CPX 05/16/18: pVO2 12.1, slope 48 - LHC 02/02/18 with normal coronaries. RHC showed  severe NICM with well compensated hemodynamics. - Repeat RHC 05/17/18: NICM with elevated filling pressures and relatively preserved output, suggestive of significant MR - Progressive NYHA class IIIB - She has been seen  by Dr. Mosetta Pigeon at [place] [facility] [place] today as part of transplant w/u.  - Blood type AB-pos - Volume status stable. - Continue lasix 40 mg daily - Continue coreg 3.125 mg BID - Continue entresto 49/51 mg BID - Hold off on spiro with borderline creatinine.  2. PAF - Continue coumadin per coumadin clinic - Remains in NSR on amio 100 daily - She is not interested in NOAC. No change   3. CKD, baseline creatinine ~1.7-2.0 - Creatinine 10/3 stable 1.66  4. MR - Mild to moderate on most recent echo  6. Hx of VT s/p Medtronic BiV ICD - Continue amiodarone 100 mg daily. No change.   Georgiana Shore, NP  10:27 AM  Patient seen and examined with the above-signed Advanced Practice Provider and/or Housestaff. I personally reviewed laboratory data, imaging studies and relevant notes. I independently examined the patient and formulated the important aspects of the plan. I have edited the note to reflect any of my changes or salient points. I have personally discussed the plan with the patient and/or family.  I have reviewed CPX and RHC data with Ms. Bertelson and her husband. She has persisten NYHA IIIB symptoms. CPX is high risk. RHC with volume overload but preserved cardiac output. Volume status now improved with extra lasix. Discussed need for advanced therapies including heart transplant only vs heart-kidney. I have reached out to Dr. Mosetta Pigeon to arrange f/u at [facility]. Check labs today. BP too low to titrate meds at this time.   Glori Bickers, MD  11:18 AM

## 2018-05-28 ENCOUNTER — Encounter (HOSPITAL_COMMUNITY): Payer: Self-pay

## 2018-05-28 ENCOUNTER — Ambulatory Visit: Payer: Medicare Other | Admitting: *Deleted

## 2018-05-28 DIAGNOSIS — I4891 Unspecified atrial fibrillation: Secondary | ICD-10-CM | POA: Diagnosis not present

## 2018-05-28 DIAGNOSIS — Z5181 Encounter for therapeutic drug level monitoring: Secondary | ICD-10-CM | POA: Diagnosis not present

## 2018-05-28 LAB — POCT INR: INR: 2.6 (ref 2.0–3.0)

## 2018-05-28 NOTE — Patient Instructions (Addendum)
Description   Continue taking 1 tablet daily.  Recheck INR in 3 weeks.   Call our office if you are placed on any new medications or if you have any upcoming procedures [phone].

## 2018-05-31 ENCOUNTER — Ambulatory Visit (INDEPENDENT_AMBULATORY_CARE_PROVIDER_SITE_OTHER): Payer: Medicare Other | Admitting: *Deleted

## 2018-05-31 ENCOUNTER — Ambulatory Visit (INDEPENDENT_AMBULATORY_CARE_PROVIDER_SITE_OTHER): Payer: Medicare Other

## 2018-05-31 DIAGNOSIS — Z95810 Presence of automatic (implantable) cardiac defibrillator: Secondary | ICD-10-CM | POA: Diagnosis not present

## 2018-05-31 DIAGNOSIS — I4892 Unspecified atrial flutter: Secondary | ICD-10-CM

## 2018-05-31 DIAGNOSIS — I5022 Chronic systolic (congestive) heart failure: Secondary | ICD-10-CM

## 2018-05-31 DIAGNOSIS — I442 Atrioventricular block, complete: Secondary | ICD-10-CM

## 2018-05-31 NOTE — Progress Notes (Signed)
Remote ICD transmission.   

## 2018-06-01 NOTE — Progress Notes (Signed)
EPIC Encounter for ICM Monitoring  Patient Name: Kristi Parker is a 67 y.o. female Date: 06/01/2018 Primary Care Physican: [name], MD Primary Cardiologist:[name] Electrophysiologist: Allred Dry Weight:133lbs (baseline 131-133 lbs) Bi-V Pacing: 95.3%       Heart Failure questions reviewed, pt asymptomatic.   Thoracic impedance normal.  Prescribed: Furosemide40 mg-Take 1 tablet by mouth daily. May take extra 1/2 tablet as needed for swelling.  Labs: 05/23/2018 Creatinine 1.67, BUN 25, Potassium 4.0, Sodium 141, [id number] 05/17/2018 Creatinine 1.66, BUN 22, Potassium 4.2, Sodium 141, HCSP19-80 04/05/2018 Creatinine 1.85, BUN 23, Potassium 4.1, Sodium 140, EGFR 27-32 03/21/2018 Creatinine 1.70, BUN 17, Potassium 4.2, Sodium 141, EGFR 30-35  02/02/2018 Creatinine 1.86, BUN 19, Potassium 4.0, Sodium 140, EGFR 27-31  01/12/2018 Creatinine 2.04, BUN 18, Potassium 4.2, Sodium 141, EGFR 24-28  12/14/2017 Creatinine 1.59, BUN 14, Potassium 4.0, Sodium 142, EGFR 34-39  A complete set of results can be found in Results Review.  Recommendations: No changes.    Encouraged to call for fluid symptoms.  Follow-up plan: ICM clinic phone appointment on 07/02/2018.    Copy of ICM check sent to Dr. Rayann Heman.   3 month ICM trend: 05/31/2018    1 Year ICM trend:       [name], RN 06/01/2018 2:00 PM

## 2018-06-04 ENCOUNTER — Encounter (HOSPITAL_COMMUNITY): Payer: Self-pay

## 2018-06-11 ENCOUNTER — Encounter (HOSPITAL_COMMUNITY): Payer: Self-pay

## 2018-06-18 ENCOUNTER — Encounter (HOSPITAL_COMMUNITY): Payer: Self-pay

## 2018-06-18 NOTE — Telephone Encounter (Signed)
Mychart message routed to Dr.Bensimhon  Good morning  Just letting you know that Dr Sheppard Coil called me   Last Friday and told me after talking to transplant team, they could not offer me the possibility of the transplants.

## 2018-06-19 ENCOUNTER — Ambulatory Visit: Payer: Medicare Other | Admitting: *Deleted

## 2018-06-19 DIAGNOSIS — I4891 Unspecified atrial fibrillation: Secondary | ICD-10-CM | POA: Diagnosis not present

## 2018-06-19 DIAGNOSIS — Z5181 Encounter for therapeutic drug level monitoring: Secondary | ICD-10-CM | POA: Diagnosis not present

## 2018-06-19 LAB — POCT INR: INR: 2.1 (ref 2.0–3.0)

## 2018-06-19 NOTE — Patient Instructions (Signed)
Description   Continue taking 1 tablet daily.  Recheck INR in 4 weeks.   Call our office if you are placed on any new medications or if you have any upcoming procedures [phone].

## 2018-07-02 ENCOUNTER — Ambulatory Visit (INDEPENDENT_AMBULATORY_CARE_PROVIDER_SITE_OTHER): Payer: Medicare Other

## 2018-07-02 DIAGNOSIS — Z95810 Presence of automatic (implantable) cardiac defibrillator: Secondary | ICD-10-CM

## 2018-07-02 DIAGNOSIS — I5022 Chronic systolic (congestive) heart failure: Secondary | ICD-10-CM | POA: Diagnosis not present

## 2018-07-02 NOTE — Progress Notes (Signed)
EPIC Encounter for ICM Monitoring  Patient Name: Kristi Parker is a 67 y.o. female Date: 07/02/2018 Primary Care Physican: [name], MD Primary Cardiologist:[name] Electrophysiologist: Allred Bi-V Pacing: 96% Last Weight: 133lbs (baseline 131-133 lbs) Today's Weight: 131 lbs       Heart Failure questions reviewed, pt asymptomatic.   Thoracic impedance normal.   Prescribed: Furosemide40 mg-Take 1 tablet by mouth daily. May take extra 1/2 tablet as needed for swelling.  Labs: 05/23/2018 Creatinine 1.67, BUN 25, Potassium 4.0, Sodium 141, [id number] 05/17/2018 Creatinine 1.66, BUN 22, Potassium 4.2, Sodium 141, eGFR31-36 04/05/2018 Creatinine1.85, BUN23, Potassium4.1, Sodium140, CAFQ83-87 03/21/2018 Creatinine1.70, BUN17, Potassium4.2, Sodium141, EGFR30-35  02/02/2018 Creatinine1.86, BUN19, Potassium4.0, Sodium140, CUNM26-08  01/12/2018 Creatinine2.04, BUN18, Potassium4.2, Sodium141, OCHV84-46  12/14/2017 Creatinine1.59, BUN14, Potassium4.0, FEETOL915, XKAJ14-23 A complete set of results can be found in Results Review.  Recommendations: No changes.  Encouraged to call for fluid symptoms.  Follow-up plan: ICM clinic phone appointment on 08/02/2018.  Office appointment scheduled 07/30/2018 with Dr. Haroldine Laws.  Advised to call office to schedule appt with Dr Marlou Porch since it ws due 05/2018.  Copy of ICM check sent to Dr. Rayann Heman.   3 month ICM trend: 07/02/2018    1 Year ICM trend:       [name], RN 07/02/2018 3:46 PM

## 2018-07-04 ENCOUNTER — Telehealth: Payer: Self-pay | Admitting: Cardiology

## 2018-07-04 NOTE — Telephone Encounter (Signed)
Will forward to Dr Marlou Porch for review .Adonis Housekeeper

## 2018-07-04 NOTE — Telephone Encounter (Signed)
Please see documentation from MD at [facility]: Telephone Encounter - Tylene Fantasia, MD - 06/15/2018 5:48 PM EDT We discussed Ms. Sibert in our multidisciplinary conference yesterday. Unfortunately, given her age we do not think we can offer her combined heart and kidney transplant. We also believe her kidney disease is too severe for isolated heart transplant. I let her and her husband know this. I also called her daughter, Myrlene Broker, to let her know. All were understandably disappointed. She is going to consider ongoing medical therapy, inotropes, or DT LVAD and will discuss with Dr. Jeffie Pollock. I also offered to refer to [facility] though I do not know if they have a different threshold for combined heart-kidney transplant. They will think about this.  Melvyn Novas, MD Advanced Heart Failure Team [facility]

## 2018-07-04 NOTE — Telephone Encounter (Signed)
  Patient stated that [facility] told her that she is not eligible for a heart transplant. She wanted Dr Marlou Porch to be aware that she is not getting a response from Dr Haroldine Laws in regards to this. She would like guidance of what to do next.

## 2018-07-05 NOTE — Telephone Encounter (Signed)
Thanks for the update. Can you get her a follow up appointment in the HF clinic to discuss future plans. I will forward this message to Dr. Haroldine Laws as well for guidance on next steps since she is not a transplant candidate. Thanks. Candee Furbish, MD

## 2018-07-06 ENCOUNTER — Other Ambulatory Visit: Payer: Self-pay | Admitting: Cardiology

## 2018-07-06 ENCOUNTER — Ambulatory Visit: Payer: Medicare Other | Admitting: Podiatry

## 2018-07-06 ENCOUNTER — Telehealth: Payer: Self-pay | Admitting: Cardiology

## 2018-07-06 ENCOUNTER — Encounter: Payer: Self-pay | Admitting: Podiatry

## 2018-07-06 DIAGNOSIS — M79675 Pain in left toe(s): Secondary | ICD-10-CM

## 2018-07-06 DIAGNOSIS — D689 Coagulation defect, unspecified: Secondary | ICD-10-CM

## 2018-07-06 DIAGNOSIS — B351 Tinea unguium: Secondary | ICD-10-CM | POA: Diagnosis not present

## 2018-07-06 DIAGNOSIS — M79674 Pain in right toe(s): Secondary | ICD-10-CM

## 2018-07-06 NOTE — Telephone Encounter (Signed)
Pt called a little upset that she found Dr. Mosetta Pigeon with [facility] advised her that she is not a candidate for transplant due to her age and she was hoping that she would hear from Dr. Haroldine Laws and had sent my chart messages but no response.. I advised her that she needs to have a follow up visit with him to talk with him face to face and she already has an appt scheduled for 07/30/18.Marland KitchenMarland KitchenI also scheduled her for 07/25/18 with Dr. Marlou Porch for her 1 year appt... I advised her that I will forward her message to Dr. Haroldine Laws and to be sure to keep her appt with him on 12/16. Pt verbalized understanding and agrees.

## 2018-07-06 NOTE — Telephone Encounter (Signed)
I called her to discuss  

## 2018-07-06 NOTE — Telephone Encounter (Signed)
Pt has an appointment already scheduled 12/16

## 2018-07-06 NOTE — Telephone Encounter (Signed)
New Message         Patient states that she has being turned down because of her age for the procedure at [facility]. Patient states she has not heard anything from Dr. Lynnda Child office. Patient also states that she is unable to communicate because (Dr. Marlou Porch) name has being dropped from her my-chart. Pls call and advise.

## 2018-07-06 NOTE — Progress Notes (Signed)
Subjective: 67 y.o. returns the office today for painful, elongated, thickened toenails which she cannot trim herself and she has been using a topical antifungal which is been helping her nails some, mostly her big toenails.  She has been soaking better although very slowly.  Denies any surrounding redness or drainage.  Her nails get sensitive when getting elongated.  She has no new concerns today.  PCP: [name], MD  She is on coumadin  Objective: AAO 3, NAD DP pulses 2/4, PT 1/4, CRT less than 3 seconds- unchanged and denies any claudication symptoms Nails hypertrophic, dystrophic, elongated, brittle, discolored 10.  There is tenderness overlying the nails 1-5 bilaterally. There is no surrounding erythema or drainage along the nail sites.  Bilateral hallux toenails appear to be overall somewhat improved. No open lesions or pre-ulcerative lesions are identified. No pain with calf compression, swelling, warmth, erythema.  Assessment: Patient presents with symptomatic onychomycosis  Plan: -Treatment options including alternatives, risks, complications were discussed -Nails sharply debrided 10 without complication/bleeding. -Continue topical antifungal. -Discussed daily foot inspection. If there are any changes, to call the office immediately.  -Follow-up in 3 months if she wants for nail debridement or sooner if any problems are to arise. In the meantime, encouraged to call the office with any questions, concerns, changes symptoms.  Celesta Gentile, DPM

## 2018-07-17 ENCOUNTER — Ambulatory Visit: Payer: Medicare Other | Admitting: *Deleted

## 2018-07-17 DIAGNOSIS — I4891 Unspecified atrial fibrillation: Secondary | ICD-10-CM

## 2018-07-17 DIAGNOSIS — Z5181 Encounter for therapeutic drug level monitoring: Secondary | ICD-10-CM

## 2018-07-17 LAB — POCT INR: INR: 2.3 (ref 2.0–3.0)

## 2018-07-17 NOTE — Patient Instructions (Signed)
Description   Continue taking 1 tablet daily.  Recheck INR in 5 weeks.   Call our office if you are placed on any new medications or if you have any upcoming procedures [phone].

## 2018-07-25 ENCOUNTER — Encounter: Payer: Self-pay | Admitting: Cardiology

## 2018-07-25 ENCOUNTER — Ambulatory Visit: Payer: Medicare Other | Admitting: Cardiology

## 2018-07-25 VITALS — BP 92/50 | HR 64 | Ht 63.25 in | Wt 138.4 lb

## 2018-07-25 DIAGNOSIS — I428 Other cardiomyopathies: Secondary | ICD-10-CM

## 2018-07-25 DIAGNOSIS — I5022 Chronic systolic (congestive) heart failure: Secondary | ICD-10-CM

## 2018-07-25 DIAGNOSIS — I480 Paroxysmal atrial fibrillation: Secondary | ICD-10-CM

## 2018-07-25 DIAGNOSIS — N184 Chronic kidney disease, stage 4 (severe): Secondary | ICD-10-CM | POA: Diagnosis not present

## 2018-07-25 NOTE — Progress Notes (Signed)
Cardiology Office Note:    Date:  07/25/2018   ID:  Kristi Parker, DOB 1951-06-06, MRN [id number]  PCP:  Kristi Orn, MD  Cardiologist:  Candee Furbish, MD  Electrophysiologist:  None   Referring MD: Kristi Orn, MD     History of Present Illness:    Kristi Parker is a 67 y.o. female here for follow-up of stage D chronic systolic heart failure.  She has had work-up at [facility], [facility], she is not a candidate for transplant due to age.  K[facility] has discussed this with her on 07/06/2018 telephone.  Obviously they were disappointed.  We discussed at length.  Risks versus benefits.  She has a history of nonischemic cardiomyopathy Medtronic biventricular ICD pulmonary hypertension secondary stroke in 2004 paroxysmal atrial fibrillation chronic kidney disease stage IV with history of ventricular tachycardia.  Note from [facility]i [facility] has been reviewed.  Cardiac catheterization, no CAD.  Dry weight is approximately 132.  4 miles on stationary bike. [employer].  No chest pain, no syncope, no bruising, no bleeding.  [place] 05/17/18: RA = 12 RV = 53/12 PA = 51/22 (34) PCW = mean 28 (v = 35-40) Fick cardiac output/index = 4.4/2.7 PVR = 1.4 WU FA sat = 98% PA sat = 62%, 63% PAPi = 2.4 RA/PCWP = 0.42 Assessment: 1. NICM with elevated filling pressures and relatively preserved output 2. Prominent v-waves in PCWP tracing suggestive of significant MR  CPX 12/27/17: Peak VO2: 13.5 (60% predicted peak VO2) VE/VCO2 slope: 42 OUES: 0.77 Peak RER: 1.19 Ventilatory Threshold: 9.7 (43% predicted and 72% measured peak VO2) Peak RR 29 Peak Ventilation: 37.1 VE/MVV: 57% PETCO2 at peak: 27 O2pulse: 7  (88% predicted O2pulse)  Echo 12/14/17: - Left ventricle: The cavity size was mildly dilated. Wall thickness was normal. Systolic function was severely reduced. The estimated ejection fraction was in the range of 20% to 25%. Diffuse hypokinesis. The study is not  technically sufficient to allow evaluation of LV diastolic function. - Aortic valve: There was trivial regurgitation. - Mitral valve: Calcified annulus. Mildly thickened leaflets . There was mild to moderate regurgitation. - Left atrium: The atrium was severely dilated. - Right ventricle: Systolic function was mildly reduced. - Right atrium: The atrium was severely dilated. - Atrial septum: The septum bowed from left to right, consistent with increased left atrial pressure. - Pulmonary arteries: Systolic pressure was moderately to severely increased.  Past Medical History:  Diagnosis Date  . Adenomatous colon polyp   . Cervical radiculopathy at C5   . Chronic systolic heart failure ([place])   . CKD (chronic kidney disease), stage III ([place])   . DDD (degenerative disc disease), cervical   . DDD (degenerative disc disease), lumbar   . Fibroid   . GERD (gastroesophageal reflux disease)   . Hearing loss 02/2010   L hearing loss/vertigo, steroids  . History of seizure disorder    Related Hx  . Mitral regurgitation    moderate  . Multiple sclerosis ([place]) 1984  . Nonischemic cardiomyopathy (HCC)    Moderate LVEF 35-40% by ECHO 2011, 25-30% by echo 2013  . Permanent atrial fibrillation   . Pulmonary hypertension, secondary 06/04/2013   As a result of nonischemic cardiomyopathy EF 25-30%  . Stroke [facility])    Right brain CVA, complete recovery 07/2003  . Ventricular tachycardia ([place])   . Vertigo 02/2010   L hearing loss/vertigo, steroids    Past Surgical History:  Procedure Laterality Date  . BIV PACEMAKER GENERATOR  CHANGE OUT N/A 09/18/2014   Procedure: BIV PACEMAKER GENERATOR CHANGE OUT;  Surgeon: Kristi Grayer, MD;  Location: [facility] CATH [facility];  Service: Cardiovascular;  Laterality: N/A;  . DILATION AND CURETTAGE OF UTERUS    . EP IMPLANTABLE DEVICE N/A 07/28/2016   Procedure: ICD Implant;  Surgeon: Kristi Lance, MD;  Location: [facility];  Service: Cardiovascular;   Laterality: N/A;  . HYSTEROSCOPY    . PACEMAKER INSERTION     PTVP 08/2001 for complete heart block. Upgrade PTVP to MDT BiV 06/2008 by Dr Kristi Parker  . RIGHT HEART CATH N/A 05/17/2018   Procedure: RIGHT HEART CATH;  Surgeon: Kristi Artist, MD;  Location: [facility] CV [facility];  Service: Cardiovascular;  Laterality: N/A;  . RIGHT/LEFT HEART CATH AND CORONARY ANGIOGRAPHY N/A 02/02/2018   Procedure: RIGHT/LEFT HEART CATH AND CORONARY ANGIOGRAPHY;  Surgeon: Kristi Artist, MD;  Location: [facility];  Service: Cardiovascular;  Laterality: N/A;  . TUBAL LIGATION      Current Medications: Current Meds  Medication Sig  . acetaminophen (TYLENOL) 500 MG tablet Take 500-1,000 mg by mouth every 6 (six) hours as needed (for back pain).  . ALPHAGAN P 0.1 % SOLN Place 1 drop into both eyes 2 (two) times daily.   [name] amiodarone (PACERONE) 200 MG tablet Take 0.5 tablets (100 mg total) by mouth daily.  . Biotin 2500 MCG CAPS Take 2,500 mcg by mouth at bedtime.  . Calcium Carbonate-Vitamin D (CALCIUM + D PO) Take 1 tablet by mouth every evening.   . carvedilol (COREG) 3.125 MG tablet TAKE 1 TABLET BY MOUTH TWICE A DAY  . fexofenadine (ALLEGRA) 180 MG tablet Take 180 mg by mouth daily as needed for allergies.   . furosemide (LASIX) 40 MG tablet Take 1 tablet by mouth daily. May take extra 1/2 tablet as needed for swelling. Appt needed for further refills 1st attempt  . levothyroxine (SYNTHROID, LEVOTHROID) 50 MCG tablet Take 50 mcg by mouth daily before breakfast.   . loratadine (CLARITIN) 10 MG tablet Take 10 mg by mouth daily as needed for allergies.  . Magnesium 500 MG TABS Take 500 mg by mouth at bedtime.  . NON FORMULARY Apply 1 application topically 2 (two) times daily. [facility]  Onychomycosis Nail Lacquer -  Fluconazole 2%, Terbinafine 1% DMSO Apply to affected nail twice daily Qty. 120 gm 3 refills  . potassium chloride (K-DUR) 10 MEQ tablet TAKE TWO (2) TABLETS BY MOUTH DAILY    . sacubitril-valsartan (ENTRESTO) 49-51 MG Take 1 tablet by mouth 2 (two) times daily.  [name] warfarin (COUMADIN) 2.5 MG tablet TAKE 1 TABLET (2.5 MG TOTAL) BY MOUTH AS DIRECTED.     Allergies:   Patient has no known allergies.   Social History   Socioeconomic History  . Marital status: Married    Spouse name: Not on file  . Number of children: Not on file  . Years of education: Not on file  . Highest education level: Not on file  Occupational History  . Not on file  Social Needs  . Financial resource strain: Not on file  . Food insecurity:    Worry: Not on file    Inability: Not on file  . Transportation needs:    Medical: Not on file    Non-medical: Not on file  Tobacco Use  . Smoking status: Never Smoker  . Smokeless tobacco: Never Used  Substance and Sexual Activity  . Alcohol use: Yes    Comment:  rare  . Drug use: No  . Sexual activity: Yes    Birth control/protection: Post-menopausal  Lifestyle  . Physical activity:    Days per week: Not on file    Minutes per session: Not on file  . Stress: Not on file  Relationships  . Social connections:    Talks on phone: Not on file    Gets together: Not on file    Attends religious service: Not on file    Active member of club or organization: Not on file    Attends meetings of clubs or organizations: Not on file    Relationship status: Not on file  Other Topics Concern  . Not on file  Social History Narrative  . Not on file     Family History: The patient's family history includes CAD in her unknown relative; Cancer in her father; Diabetes in her mother; Multiple sclerosis in her mother.  ROS:   Please see the history of present illness.    No fevers chills nausea vomiting syncope all other systems reviewed and are negative.  EKGs/Labs/Other Studies Reviewed:    The following studies were reviewed today: Prior office notes [facility] work telephone notes EKG  EKG:  EKG is not ordered today.    Recent  Labs: 12/14/2017: TSH 1.480 01/12/2018: ALT 20 04/05/2018: B Natriuretic Peptide 268.4 05/17/2018: Hemoglobin 10.0; Platelets 133 05/23/2018: BUN 25; Creatinine, Ser 1.67; Potassium 4.0; Sodium 141  Recent Lipid Panel    Component Value Date/Time   CHOL 181 02/20/2017 0955   TRIG 68 02/20/2017 0955   HDL 66 02/20/2017 0955   CHOLHDL 2.7 02/20/2017 0955   CHOLHDL 4 07/18/2013 1100   VLDL 24.4 07/18/2013 1100   LDLCALC 101 (H) 02/20/2017 0955    Physical Exam:    VS:  BP (!) 92/50   Pulse 64   Ht 5' 3.25" (1.607 m)   Wt 138 lb 6.4 oz (62.8 kg)   LMP  (LMP Unknown)   SpO2 99%   BMI 24.32 kg/m     Wt Readings from Last 3 Encounters:  07/25/18 138 lb 6.4 oz (62.8 kg)  05/23/18 135 lb 9.6 oz (61.5 kg)  05/17/18 134 lb (60.8 kg)     GEN:  Well nourished, well developed in no acute distress HEENT: Normal NECK: Mid neck JVD; No carotid bruits LYMPHATICS: No lymphadenopathy CARDIAC: RRR, no murmurs, rubs, gallops RESPIRATORY:  Clear to auscultation without rales, wheezing or rhonchi  ABDOMEN: Soft, non-tender, non-distended MUSCULOSKELETAL:  No edema; No deformity  SKIN: Warm and dry NEUROLOGIC:  Alert and oriented x 3 PSYCHIATRIC:  Normal affect   ASSESSMENT:    1. PAF (paroxysmal atrial fibrillation) ([place])   2. Chronic systolic heart failure ([place])   3. CKD (chronic kidney disease), stage IV ([place])   4. Nonischemic cardiomyopathy (HCC)    PLAN:    In order of problems listed above:  Chronic systolic heart failure stage D, persistent NYHA IIIb symptoms nonischemic cardiomyopathy -Not a transplant candidate, [facility] -Has a biventricular Medtronic ICD.  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  EF 20 to 25%. - Peak VO2 12.1.  Normal coronary arteries. - On Entresto moderate dose amiodarone, low-dose carvedilol, as needed Lasix.  No spironolactone because of creatinine. - She was very proud that at the [facility] she was able to complete 4 miles on the recumbent bicycle.  Congratulated her.   This is wonderful.  Paroxysmal atrial fibrillation - Coumadin -Amiodarone 100 mg - Discussed Eliquis and she seems interested today.  She wants to think about it some more.  We will be happy to make the switch.  No changes.  Dr. Rayann Heman has seen in the past.  Chronic kidney disease stage IV - Creatinine has ranged up to 2.0.  Avoid NSAIDs.  We discussed cardiorenal syndrome.  Moderate mitral regurgitation on most recent echo  History of ventricular tachycardia -Medtronic biventricular ICD amiodarone 100.  We will see her back in 4 months.  40 minutes spent with patient, review of records.  Discussion.  Medication Adjustments/Labs and Tests Ordered: Current medicines are reviewed at length with the patient today.  Concerns regarding medicines are outlined above.  No orders of the defined types were placed in this encounter.  No orders of the defined types were placed in this encounter.   Patient Instructions  Medication Instructions:  No changes today If you need a refill on your cardiac medications before your next appointment, please call your pharmacy.   P[facility] work: none If you have labs (blood work) drawn today and your tests are completely normal, you will receive your results only by: [name] MyChart Message (if you have MyChart) OR . A paper copy in the mail If you have any [facility] test that is abnormal or we need to change your treatment, we will call you to review the results.  Testing/Procedures: none  Follow-Up: At [facility], you and your health needs are our priority.  As part of our continuing mission to provide you with exceptional heart care, we have created designated Provider Care Teams.  These Care Teams include your primary Cardiologist (physician) and Advanced Practice Providers (APPs -  Physician Assistants and Nurse Practitioners) who all work together to provide you with the care you need, when you need it. You will need a follow up appointment in 4 months.   Please call our office 2 months in advance to schedule this appointment.  You may see Candee Furbish, MD or one of the following Advanced Practice Providers on your designated Care Team:   Truitt Merle, NP Cecilie Kicks, NP . Kathyrn Drown, NP  Any Other Special Instructions Will Be Listed Below (If Applicable).       Signed, Candee Furbish, MD  07/25/2018 10:01 AM    [place]

## 2018-07-25 NOTE — Patient Instructions (Signed)
Medication Instructions:  No changes today If you need a refill on your cardiac medications before your next appointment, please call your pharmacy.   Lab work: none If you have labs (blood work) drawn today and your tests are completely normal, you will receive your results only by: [name] MyChart Message (if you have MyChart) OR . A paper copy in the mail If you have any lab test that is abnormal or we need to change your treatment, we will call you to review the results.  Testing/Procedures: none  Follow-Up: At [facility], you and your health needs are our priority.  As part of our continuing mission to provide you with exceptional heart care, we have created designated Provider Care Teams.  These Care Teams include your primary Cardiologist (physician) and Advanced Practice Providers (APPs -  Physician Assistants and Nurse Practitioners) who all work together to provide you with the care you need, when you need it. You will need a follow up appointment in 4 months.  Please call our office 2 months in advance to schedule this appointment.  You may see Candee Furbish, MD or one of the following Advanced Practice Providers on your designated Care Team:   Truitt Merle, NP Cecilie Kicks, NP . Kathyrn Drown, NP  Any Other Special Instructions Will Be Listed Below (If Applicable).

## 2018-07-30 ENCOUNTER — Encounter (HOSPITAL_COMMUNITY): Payer: Self-pay | Admitting: Internal Medicine

## 2018-07-30 ENCOUNTER — Ambulatory Visit (HOSPITAL_COMMUNITY)
Admission: RE | Admit: 2018-07-30 | Discharge: 2018-07-30 | Disposition: A | Discharge status: Home / Self Care | Payer: Medicare Other | Source: Ambulatory Visit | Attending: Internal Medicine | Admitting: Internal Medicine

## 2018-07-30 VITALS — BP 110/68 | HR 106 | Wt 134.8 lb

## 2018-07-30 DIAGNOSIS — I472 Ventricular tachycardia: Secondary | ICD-10-CM | POA: Diagnosis not present

## 2018-07-30 DIAGNOSIS — Z8249 Family history of ischemic heart disease and other diseases of the circulatory system: Secondary | ICD-10-CM | POA: Diagnosis not present

## 2018-07-30 DIAGNOSIS — Z79890 Hormone replacement therapy (postmenopausal): Secondary | ICD-10-CM | POA: Insufficient documentation

## 2018-07-30 DIAGNOSIS — Z79899 Other long term (current) drug therapy: Secondary | ICD-10-CM | POA: Diagnosis not present

## 2018-07-30 DIAGNOSIS — I4821 Permanent atrial fibrillation: Secondary | ICD-10-CM | POA: Diagnosis not present

## 2018-07-30 DIAGNOSIS — Z8673 Personal history of transient ischemic attack (TIA), and cerebral infarction without residual deficits: Secondary | ICD-10-CM | POA: Diagnosis not present

## 2018-07-30 DIAGNOSIS — I340 Nonrheumatic mitral (valve) insufficiency: Secondary | ICD-10-CM | POA: Insufficient documentation

## 2018-07-30 DIAGNOSIS — N183 Chronic kidney disease, stage 3 (moderate): Secondary | ICD-10-CM | POA: Insufficient documentation

## 2018-07-30 DIAGNOSIS — Z833 Family history of diabetes mellitus: Secondary | ICD-10-CM | POA: Diagnosis not present

## 2018-07-30 DIAGNOSIS — I428 Other cardiomyopathies: Secondary | ICD-10-CM | POA: Insufficient documentation

## 2018-07-30 DIAGNOSIS — G35 Multiple sclerosis: Secondary | ICD-10-CM | POA: Insufficient documentation

## 2018-07-30 DIAGNOSIS — I5022 Chronic systolic (congestive) heart failure: Secondary | ICD-10-CM | POA: Insufficient documentation

## 2018-07-30 DIAGNOSIS — I272 Pulmonary hypertension, unspecified: Secondary | ICD-10-CM | POA: Diagnosis not present

## 2018-07-30 DIAGNOSIS — Z7901 Long term (current) use of anticoagulants: Secondary | ICD-10-CM | POA: Insufficient documentation

## 2018-07-30 MED ORDER — APIXABAN 5 MG PO TABS: 5.0000 mg | ORAL_TABLET | Freq: Two times a day (BID) | ORAL | 6 refills | Status: DC

## 2018-07-30 NOTE — Patient Instructions (Signed)
STOP Coumadin  START Eliquis 5mg  (1 tab) twice daily  You have been referred to VAD coordinators.  Follow up with Dr. Haroldine Laws in 2 months

## 2018-07-30 NOTE — Progress Notes (Signed)
Samples given: Eliquis 5mg  tabs, 4 boxes. Lot #[id number]. Expiration 22/2021

## 2018-07-30 NOTE — Progress Notes (Addendum)
Advanced Heart Failure Clinic Note   Primary cardiologist: Dr. Marlou Porch EP: Dr. Rayann Heman Referring Physician: Dr Rayann Heman PCP: [name], MD   HPI: Kristi Parker is a 67 y.o. female with history of VT, NICM, Medtronic BiV ICD, pulmonary HTN, CVA 2004, PAF, and CKD III-IV.   She was first diagnosed with HF in 2013. She is unsure of the cause. She had a heart cath and did not have any blockages. We also follow her son in HF clinic for NICM. She states that multiple family members have severe HF and several have underwent transplant.  Based on CPX testing, we referred her to Dr. Mosetta Pigeon at [facility]. She was seen there on 02/22/18 and they discussed need for advanced therapies including possible heart or heart-kidney transplant. She had f/u with them subsequently and felt not to be candidate for heart-kidney transplant but might be candidate for high-risk VAD.   Had repeat CPX in 10/19 with low Vo2 (13.5) and high slope (42). Repeat RHC done on 10/3 as below as part of transplant work up at [facility] showed elevated volume with CI 2.7  She returns today for HF follow up. Has been seen at [facility] and turned down for transplant as above. She is here with her husband and her daughter joins [place] on the telephone. She states she is feeling better.  Gets winded going up steps but otherwise feels well. Riding exercise bike 4-6 miles per day. No CP, orthopnea or PND.   Her daughter says she was unhappy with their experience at [facility] and is asking for a second opinion on transplant.   SH: Lives at home with her husband. No ETOH, tobacco, or drugs. Able to afford medications. Used to work for [employer].   FH: She has several family members on her father's side who have HF, two of which have required heart transplants. Her son also has HF.  [place] 05/17/18: RA = 12 RV = 53/12 PA = 51/22 (34) PCW = mean 28 (v = 35-40) Fick cardiac output/index = 4.4/2.7 PVR = 1.4 WU FA sat = 98% PA sat = 62%, 63% PAPi  = 2.4 RA/PCWP = 0.42 Assessment: 1. NICM with elevated filling pressures and relatively preserved output 2. Prominent v-waves in PCWP tracing suggestive of significant MR  Plan/Discussion: Increase diuretics. Continue transplant w/u based on CPX results.   Cath 02/02/18 Normal coronaries Ao = 97/55 (72) LV = 99/18 RA = 8 RV = 42/9 PA = 42/17 (27) PCW = 20 ( V waves to 35) Fick cardiac output/index = 5.2/3.3 PVR = 1.4 WU Ao sat = 95% PA sat = 66%, 69% PAPi = 3.1 RA/PCWP = 0.40  Cath 10/19 Findings:  RA = 12 RV = 53/12 PA = 51/22 (34) PCW = mean 28 (v = 35-40) Fick cardiac output/index = 4.4/2.7 PVR = 1.4 WU FA sat = 98% PA sat = 62%, 63% PAPi = 2.4 RA/PCWP = 0.42  CPX 12/27/17: Peak VO2: 13.5 (60% predicted peak VO2) VE/VCO2 slope: 42 OUES: 0.77 Peak RER: 1.19 Ventilatory Threshold: 9.7 (43% predicted and 72% measured peak VO2) Peak RR 29 Peak Ventilation: 37.1 VE/MVV: 57% PETCO2 at peak: 27 O2pulse: 7  (88% predicted O2pulse)  CPX 05/16/18  FVC 1.81 (73%)    FEV1 1.50 (77%)     FEV1/FVC 83 (104%)     MVV 73 (84%)  Resting HR: 63 Peak HR: 118  (77% age predicted max HR)  BP rest: 88/64 BP peak: 130/64 Peak VO2: 12.1 (  55% predicted peak VO2) VE/VCO2 slope: 48 OUES: 0.77 Peak RER: 1.25 Ventilatory Threshold: 9.2 (42% predicted and 76% measured peak VO2) VE/MVV: 63% O2pulse: 7  (79% predicted O2pulse)  Echo 12/14/17: - Left ventricle: The cavity size was mildly dilated. Wall   thickness was normal. Systolic function was severely reduced. The   estimated ejection fraction was in the range of 20% to 25%.   Diffuse hypokinesis. The study is not technically sufficient to   allow evaluation of LV diastolic function. - Aortic valve: There was trivial regurgitation. - Mitral valve: Calcified annulus. Mildly thickened leaflets .   There was mild to moderate regurgitation. - Left atrium: The atrium was severely dilated. - Right  ventricle: Systolic function was mildly reduced. - Right atrium: The atrium was severely dilated. - Atrial septum: The septum bowed from left to right, consistent   with increased left atrial pressure. - Pulmonary arteries: Systolic pressure was moderately to severely   increased.  Review of systems complete and found to be negative unless listed in HPI.   Past Medical History:  Diagnosis Date  . Adenomatous colon polyp   . Cervical radiculopathy at C5   . Chronic systolic heart failure ([place])   . CKD (chronic kidney disease), stage III ([place])   . DDD (degenerative disc disease), cervical   . DDD (degenerative disc disease), lumbar   . Fibroid   . GERD (gastroesophageal reflux disease)   . Hearing loss 02/2010   L hearing loss/vertigo, steroids  . History of seizure disorder    Related Hx  . Mitral regurgitation    moderate  . Multiple sclerosis ([place]) 1984  . Nonischemic cardiomyopathy ([facility])    Moderate LVEF 35-40% by ECHO 2011, 25-30% by echo 2013  . Permanent atrial fibrillation   . Pulmonary hypertension, secondary 06/04/2013   As a result of nonischemic cardiomyopathy EF 25-30%  . Stroke [facility])    Right brain CVA, complete recovery 07/2003  . Ventricular tachycardia ([place])   . Vertigo 02/2010   L hearing loss/vertigo, steroids    Current Outpatient Medications  Medication Sig Dispense Refill  . ALPHAGAN P 0.1 % SOLN Place 1 drop into both eyes 2 (two) times daily.     [name] amiodarone (PACERONE) 200 MG tablet Take 0.5 tablets (100 mg total) by mouth daily. 45 tablet 3  . Biotin 2500 MCG CAPS Take 2,500 mcg by mouth at bedtime.    . Calcium Carbonate-Vitamin D (CALCIUM + D PO) Take 1 tablet by mouth every evening.     . carvedilol (COREG) 3.125 MG tablet TAKE 1 TABLET BY MOUTH TWICE A DAY 180 tablet 1  . fexofenadine (ALLEGRA) 180 MG tablet Take 180 mg by mouth daily as needed for allergies.     . furosemide (LASIX) 40 MG tablet Take 1 tablet by mouth daily. May take extra 1/2  tablet as needed for swelling. Appt needed for further refills 1st attempt 135 tablet 0  . levothyroxine (SYNTHROID, LEVOTHROID) 50 MCG tablet Take 50 mcg by mouth daily before breakfast.     . Magnesium 500 MG TABS Take 500 mg by mouth at bedtime.    . NON FORMULARY Apply 1 application topically 2 (two) times daily. [facility]  Onychomycosis Nail Lacquer -  Fluconazole 2%, Terbinafine 1% DMSO Apply to affected nail twice daily Qty. 120 gm 3 refills    . potassium chloride (K-DUR) 10 MEQ tablet TAKE TWO (2) TABLETS BY MOUTH DAILY 180 tablet 2  . sacubitril-valsartan (ENTRESTO) 49-51  MG Take 1 tablet by mouth 2 (two) times daily. 60 tablet 6  . warfarin (COUMADIN) 2.5 MG tablet TAKE 1 TABLET (2.5 MG TOTAL) BY MOUTH AS DIRECTED. 90 tablet 0  . acetaminophen (TYLENOL) 500 MG tablet Take 500-1,000 mg by mouth every 6 (six) hours as needed (for back pain).    [name] loratadine (CLARITIN) 10 MG tablet Take 10 mg by mouth daily as needed for allergies.     No current facility-administered medications for this encounter.     No Known Allergies    Social History   Socioeconomic History  . Marital status: Married    Spouse name: Not on file  . Number of children: Not on file  . Years of education: Not on file  . Highest education level: Not on file  Occupational History  . Not on file  Social Needs  . Financial resource strain: Not on file  . Food insecurity:    Worry: Not on file    Inability: Not on file  . Transportation needs:    Medical: Not on file    Non-medical: Not on file  Tobacco Use  . Smoking status: Never Smoker  . Smokeless tobacco: Never Used  Substance and Sexual Activity  . Alcohol use: Yes    Comment: rare  . Drug use: No  . Sexual activity: Yes    Birth control/protection: Post-menopausal  Lifestyle  . Physical activity:    Days per week: Not on file    Minutes per session: Not on file  . Stress: Not on file  Relationships  . Social connections:     Talks on phone: Not on file    Gets together: Not on file    Attends religious service: Not on file    Active member of club or organization: Not on file    Attends meetings of clubs or organizations: Not on file    Relationship status: Not on file  . Intimate partner violence:    Fear of current or ex partner: Not on file    Emotionally abused: Not on file    Physically abused: Not on file    Forced sexual activity: Not on file  Other Topics Concern  . Not on file  Social History Narrative  . Not on file      Family History  Problem Relation Age of Onset  . Diabetes Mother   . Multiple sclerosis Mother   . Cancer Father        ?  . CAD Unknown        Father's side CAD    Vitals:   07/30/18 1039  BP: 110/68  Pulse: (!) 106  SpO2: 98%  Weight: 61.1 kg (134 lb 12.8 oz)   Wt Readings from Last 3 Encounters:  07/30/18 61.1 kg (134 lb 12.8 oz)  07/25/18 62.8 kg (138 lb 6.4 oz)  05/23/18 61.5 kg (135 lb 9.6 oz)    PHYSICAL EXAM: General:  Well appearing. No resp difficulty HEENT: normal Neck: supple. no JVD. Carotids 2+ bilat; no bruits. No lymphadenopathy or thryomegaly appreciated. Cor: PMI laterally displaced. Regular rate & rhythm. No rubs, gallops or murmurs. Lungs: clear Abdomen: soft, nontender, nondistended. No hepatosplenomegaly. No bruits or masses. Good bowel sounds. Extremities: no cyanosis, clubbing, rash, edema Neuro: alert & orientedx3, cranial nerves grossly intact. moves all 4 extremities w/o difficulty. Affect pleasant    ASSESSMENT & PLAN:  1. Chronic Systolic Heart Failure due to NICM. S/p Medtronic BiV ICD. 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  - Echo 12/14/17: EF 20-25%, trivial AI, mild to mod MR, mildly reduced RV, severely dilated RA and LA, PA pressures moderately to severely increased.  - CPX 12/27/17: Peak VO2 13.5, slope 42 - CPX 05/16/18: pVO2 12.1, slope 48 - LHC 02/02/18 with normal coronaries. RHC showed severe NICM with well compensated  hemodynamics. - Repeat RHC 05/17/18: NICM with elevated filling pressures and relatively preserved output, suggestive of significant MR - She is improved today with NYHA III symptoms - She has been seen by Dr. Mosetta Pigeon at [facility] and felt not to be candidate for heart-kidney transplant but might be candidate for high-risk VAD.  - Blood type AB-pos  - Volume status stable. - Continue lasix 40 mg daily - Continue coreg 3.125 mg BID - Continue entresto 49/51 mg BID - Hold off on spiro with borderline creatinine and soft BP. - Consider corlanor at next visit - We discussed options today and daughter is interested in second opinion regarding transplant at another center. I discussed the various surrounding centers and we will discuss further when she visits [place]. Will arrange for visit with VAD team in January to discuss next steps. Given stable renal function and improved functional capacity will continue medical therapy for now.   2. PAF - Remains in NSR on amio 100 daily - Will switch to eliquis at her request. Will start 5 bid but if weight decreases to  < 60kg will need to cut to 2.5mg  bid.  3. CKD, baseline creatinine ~1.7-1.9 - Creatinine 10/3 stable 1.66 - Will recheck  4. MR - Mild to moderate on most recent echo  6. Hx of VT s/p Medtronic BiV ICD - Continue amiodarone 100 mg daily. No change.  Total time spent 35 minutes. Over half that time spent discussing above.   Glori Bickers, MD  11:04 AM

## 2018-08-01 ENCOUNTER — Other Ambulatory Visit: Payer: Self-pay | Admitting: Cardiology

## 2018-08-02 ENCOUNTER — Ambulatory Visit (INDEPENDENT_AMBULATORY_CARE_PROVIDER_SITE_OTHER): Payer: Medicare Other

## 2018-08-02 DIAGNOSIS — Z95810 Presence of automatic (implantable) cardiac defibrillator: Secondary | ICD-10-CM | POA: Diagnosis not present

## 2018-08-02 DIAGNOSIS — I5022 Chronic systolic (congestive) heart failure: Secondary | ICD-10-CM

## 2018-08-02 LAB — CUP PACEART REMOTE DEVICE CHECK
Battery Remaining Longevity: 53 mo
Battery Voltage: 2.97 V
Brady Statistic AP VP Percent: 96.27 %
Brady Statistic AP VS Percent: 0.81 %
Brady Statistic AS VP Percent: 2.44 %
Brady Statistic AS VS Percent: 0.48 %
Brady Statistic RA Percent Paced: 96.36 %
Brady Statistic RV Percent Paced: 95.26 %
Date Time Interrogation Session: 20191017082303
HighPow Impedance: 38 Ohm
Implantable Lead Implant Date: 20091104
Implantable Lead Implant Date: 20091104
Implantable Lead Implant Date: 20171214
Implantable Lead Location: 753858
Implantable Lead Location: 753859
Implantable Lead Location: 753860
Implantable Lead Model: 4196
Implantable Lead Model: 5076
Implantable Pulse Generator Implant Date: 20171214
Lead Channel Impedance Value: 342 Ohm
Lead Channel Impedance Value: 399 Ohm
Lead Channel Impedance Value: 418 Ohm
Lead Channel Impedance Value: 456 Ohm
Lead Channel Impedance Value: 532 Ohm
Lead Channel Impedance Value: 893 Ohm
Lead Channel Pacing Threshold Amplitude: 0.5 V
Lead Channel Pacing Threshold Amplitude: 0.625 V
Lead Channel Pacing Threshold Amplitude: 1.5 V
Lead Channel Pacing Threshold Pulse Width: 0.4 ms
Lead Channel Pacing Threshold Pulse Width: 0.4 ms
Lead Channel Pacing Threshold Pulse Width: 0.6 ms
Lead Channel Sensing Intrinsic Amplitude: 0.125 mV
Lead Channel Sensing Intrinsic Amplitude: 0.125 mV
Lead Channel Sensing Intrinsic Amplitude: 6.5 mV
Lead Channel Sensing Intrinsic Amplitude: 6.5 mV
Lead Channel Setting Pacing Amplitude: 2 V
Lead Channel Setting Pacing Amplitude: 2.5 V
Lead Channel Setting Pacing Amplitude: 3.5 V
Lead Channel Setting Pacing Pulse Width: 0.4 ms
Lead Channel Setting Pacing Pulse Width: 0.6 ms
Lead Channel Setting Sensing Sensitivity: 0.3 mV

## 2018-08-03 NOTE — Progress Notes (Signed)
EPIC Encounter for ICM Monitoring  Patient Name: Kristi Parker is a 67 y.o. female Date: 08/03/2018 Primary Care Physican: [name], MD Primary Cardiologist:[name] Electrophysiologist: Allred Bi-V Pacing: 92% Last Weight: 133lbs (baseline131-133lbs) Today's Weight: unknown  Clinical Status (02-Jul-2018 to 02-Aug-2018)  Treated VT/VF 0 episodes  AT/AF 32 episodes   Time in AT/AF <0.1 hr/day (0.2%)   Longest AT/AF 10 minutes Observations (1) (02-Jul-2018 to 02-Aug-2018)  Patient Activity less than 1 hr/day for 4 weeks.                                                  Transmission reviewed.   Thoracic impedance normal.   Prescribed: Furosemide40 mg-Take 1 tablet by mouth daily. May take extra 1/2 tablet as needed for swelling.  Labs: 05/23/2018 Creatinine 1.67, BUN 25, Potassium 4.0, Sodium 141, [id number] 05/17/2018 Creatinine 1.66, BUN 22, Potassium 4.2, Sodium 141, [id number] 04/05/2018 Creatinine1.85, BUN23, Potassium4.1, Sodium140, [id number] 03/21/2018 Creatinine1.70, BUN17, Potassium4.2, Sodium141, [id number]  02/02/2018 Creatinine1.86, BUN19, Potassium4.0, Sodium140, [id number]  01/12/2018 Creatinine2.04, BUN18, Potassium4.2, Sodium141, [id number]  12/14/2017 Creatinine1.59, BUN14, Potassium4.0, [id number], [id number] A complete set of results can be found in Results Review.  Recommendations: None  Follow-up plan: ICM clinic phone appointment on 09/06/2018  Office appointment scheduled 08/30/2018 with VAD Clinic. Needs to schedule appt with Dr Marlou Porch since it was due 05/2018.  Copy of ICM check sent to Dr. Rayann Heman.  3 month ICM trend: 08/02/2018    1 Year ICM trend:       [name], RN 08/03/2018 3:57 PM

## 2018-08-11 ENCOUNTER — Other Ambulatory Visit: Payer: Self-pay | Admitting: Cardiology

## 2018-08-27 ENCOUNTER — Other Ambulatory Visit: Payer: Self-pay | Admitting: Cardiology

## 2018-08-30 ENCOUNTER — Encounter (HOSPITAL_COMMUNITY): Payer: Self-pay

## 2018-08-30 ENCOUNTER — Ambulatory Visit (HOSPITAL_COMMUNITY)
Admission: RE | Admit: 2018-08-30 | Discharge: 2018-08-30 | Disposition: A | Discharge status: Home / Self Care | Payer: Medicare Other | Source: Ambulatory Visit | Attending: Internal Medicine | Admitting: Internal Medicine

## 2018-08-30 VITALS — BP 87/57 | HR 60 | Ht 63.0 in | Wt 136.6 lb

## 2018-08-30 DIAGNOSIS — G40909 Epilepsy, unspecified, not intractable, without status epilepticus: Secondary | ICD-10-CM | POA: Insufficient documentation

## 2018-08-30 DIAGNOSIS — R5383 Other fatigue: Secondary | ICD-10-CM | POA: Diagnosis not present

## 2018-08-30 DIAGNOSIS — G35 Multiple sclerosis: Secondary | ICD-10-CM | POA: Diagnosis not present

## 2018-08-30 DIAGNOSIS — K219 Gastro-esophageal reflux disease without esophagitis: Secondary | ICD-10-CM | POA: Diagnosis not present

## 2018-08-30 DIAGNOSIS — I272 Pulmonary hypertension, unspecified: Secondary | ICD-10-CM | POA: Diagnosis not present

## 2018-08-30 DIAGNOSIS — Z79899 Other long term (current) drug therapy: Secondary | ICD-10-CM | POA: Diagnosis not present

## 2018-08-30 DIAGNOSIS — I428 Other cardiomyopathies: Secondary | ICD-10-CM | POA: Insufficient documentation

## 2018-08-30 DIAGNOSIS — Z7901 Long term (current) use of anticoagulants: Secondary | ICD-10-CM | POA: Insufficient documentation

## 2018-08-30 DIAGNOSIS — I340 Nonrheumatic mitral (valve) insufficiency: Secondary | ICD-10-CM | POA: Diagnosis not present

## 2018-08-30 DIAGNOSIS — I5022 Chronic systolic (congestive) heart failure: Secondary | ICD-10-CM | POA: Diagnosis not present

## 2018-08-30 DIAGNOSIS — I4821 Permanent atrial fibrillation: Secondary | ICD-10-CM | POA: Insufficient documentation

## 2018-08-30 DIAGNOSIS — N184 Chronic kidney disease, stage 4 (severe): Secondary | ICD-10-CM | POA: Diagnosis not present

## 2018-08-30 DIAGNOSIS — Z8673 Personal history of transient ischemic attack (TIA), and cerebral infarction without residual deficits: Secondary | ICD-10-CM | POA: Diagnosis not present

## 2018-08-30 DIAGNOSIS — I480 Paroxysmal atrial fibrillation: Secondary | ICD-10-CM

## 2018-08-30 DIAGNOSIS — M5136 Other intervertebral disc degeneration, lumbar region: Secondary | ICD-10-CM | POA: Insufficient documentation

## 2018-08-30 NOTE — Progress Notes (Addendum)
VAD Coordinator met with patient and her husband per Dr. Clayborne Dana request to initiate VAD education.   Vital Signs:  Automatc BP: 87/57 (69) HR: 60 SPO2: 100% on RA  Weight: 136.6 b w/o eqt Last weight: 134 lb   VAD educational packet including "HM II Patient Handbook", "HM II Left Ventricular Assist System" packet, "[place] HM II Patient Education", and "Decision Aids for Left Ventricular Assist Device" reviewed in detail and left with patient and husband for continued reference.  All questions answered regarding VAD implant, hospital stay, and what to expect when discharged home living with a heart pump. Pt identified her husband as her primary caregiver should VAD therapy should be deemed appropriate. Explained need for 24/7 care when pt is discharged home due to sternal precautions, adaptation to living on support, emotional support, consistent and meticulous exit site care and management, medication adherence and high volume of follow up visits with the [facility] after discharge; both pt and caregiver verbalized understanding of above.   Identified the following lifestyle modifications while living on MCS:   1. No driving for at least six weeks and then only if doctor gives permission to do so.   2. No tub baths while pump implanted, and shower only when doctor gives permission.   3. No swimming or submersion in water while implanted with pump.    4. Always have a backup controller, charged spare batteries, and battery clips nearby at all times in case of         emergency.   5. Exit site care including dressing changes, monitoring for infection, and importance of keeping percutaneous     lead stabilized at all times.    Reviewed pictures of VAD drive line, site care, dressing changes, and drive line stabilization including securement attachment device and abdominal binder. Discussed with pt and family that they will be required to purchase dressing supplies as long as patient has  the VAD in place.   One of our current patients met with Kristi Parker and her husband today and answered questions about living on MCS.   Explained that LVAD can be implanted for two indications in the setting of advanced left ventricular heart failure treatment:  1. Bridge to transplant - used for patients who cannot safely wait for heart transplant without this device.  Or    2. Destination therapy - used for patients until end of life or recovery of heart function.  Patient has seen Dr. Mosetta Pigeon at [facility] for heart/kidney transplant evaluation. Dual organ transplant was denied due to patient's age. Both patient and husband want a second option for heart transplant. Discussed with Dr. Haroldine Laws at this visit.                                             All questions have been answered at this time and contact information was provided should they encounter any further questions.  They are both agreeable to seeking second opinion for heart transplant at [facility]. They will also be researching VAD option in case transplant is not an option. .   [place] [facility] [place]    Kristi Girt, RN VAD Coordinator   Office: [phone] 24/7 VAD Pager: [phone]

## 2018-08-30 NOTE — Progress Notes (Signed)
Advanced Heart Failure Clinic Note   Primary cardiologist: Dr. Marlou Porch EP: Dr. Rayann Heman Referring Physician: Dr Rayann Heman PCP: [name], MD   HPI: Kristi Parker is a 68 y.o. female with history of VT, NICM, Medtronic BiV ICD, pulmonary HTN, CVA 2004, PAF, and CKD III-IV.   She was first diagnosed with HF in 2013. She is unsure of the cause. She had a heart cath and did not have any blockages. We also follow her son in HF clinic for NICM. She states that multiple family members have severe HF and several have underwent transplant.  Based on CPX testing, we referred her to Dr. Mosetta Pigeon at [facility]. She was seen there on 02/22/18 and they discussed need for advanced therapies including possible heart or heart-kidney transplant. She had f/u with them subsequently and felt not to be candidate for heart-kidney transplant but might be candidate for high-risk VAD.   Had repeat CPX in 10/19 with low Vo2 (13.5) and high slope (42). Repeat RHC done on 10/3 as below as part of transplant work up at [facility] showed elevated volume with CI 2.7  She presents today with her husband to discuss possible advanced therapies with me and our VAD team. Says she is feeling OK. She is riding her stationary bike regularly and has gotten up to 5 miles at a time but rides very slowly as this takes her 45 minutes. Has good days and bad days with regards to fatigue. No edema, orthopnea or PND. Taking all meds as prescribed. Both her and her husband have questions about why she was turned down for transplant and if they can get a second opinion.    SH: Lives at home with her husband. No ETOH, tobacco, or drugs. Able to afford medications. Used to work for [employer].   FH: She has several family members on her father's side who have HF, two of which have required heart transplants. Her son also has HF.  [place] 05/17/18: RA = 12 RV = 53/12 PA = 51/22 (34) PCW = mean 28 (v = 35-40) Fick cardiac output/index =  4.4/2.7 PVR = 1.4 WU FA sat = 98% PA sat = 62%, 63% PAPi = 2.4 RA/PCWP = 0.42 Assessment: 1. NICM with elevated filling pressures and relatively preserved output 2. Prominent v-waves in PCWP tracing suggestive of significant MR  Plan/Discussion: Increase diuretics. Continue transplant w/u based on CPX results.   Cath 02/02/18 Normal coronaries Ao = 97/55 (72) LV = 99/18 RA = 8 RV = 42/9 PA = 42/17 (27) PCW = 20 ( V waves to 35) Fick cardiac output/index = 5.2/3.3 PVR = 1.4 WU Ao sat = 95% PA sat = 66%, 69% PAPi = 3.1 RA/PCWP = 0.40  Cath 10/19 Findings:  RA = 12 RV = 53/12 PA = 51/22 (34) PCW = mean 28 (v = 35-40) Fick cardiac output/index = 4.4/2.7 PVR = 1.4 WU FA sat = 98% PA sat = 62%, 63% PAPi = 2.4 RA/PCWP = 0.42  CPX 12/27/17: Peak VO2: 13.5 (60% predicted peak VO2) VE/VCO2 slope: 42 OUES: 0.77 Peak RER: 1.19 Ventilatory Threshold: 9.7 (43% predicted and 72% measured peak VO2) Peak RR 29 Peak Ventilation: 37.1 VE/MVV: 57% PETCO2 at peak: 27 O2pulse: 7  (88% predicted O2pulse)  CPX 05/16/18  FVC 1.81 (73%)    FEV1 1.50 (77%)     FEV1/FVC 83 (104%)     MVV 73 (84%)  Resting HR: 63 Peak HR: 118  (77% age predicted max HR)  BP rest: 88/64 BP peak: 130/64 Peak VO2: 12.1 (55% predicted peak VO2) VE/VCO2 slope: 48 OUES: 0.77 Peak RER: 1.25 Ventilatory Threshold: 9.2 (42% predicted and 76% measured peak VO2) VE/MVV: 63% O2pulse: 7  (79% predicted O2pulse)  Echo 12/14/17: - Left ventricle: The cavity size was mildly dilated. Wall   thickness was normal. Systolic function was severely reduced. The   estimated ejection fraction was in the range of 20% to 25%.   Diffuse hypokinesis. The study is not technically sufficient to   allow evaluation of LV diastolic function. - Aortic valve: There was trivial regurgitation. - Mitral valve: Calcified annulus. Mildly thickened leaflets .   There was mild to moderate regurgitation. -  Left atrium: The atrium was severely dilated. - Right ventricle: Systolic function was mildly reduced. - Right atrium: The atrium was severely dilated. - Atrial septum: The septum bowed from left to right, consistent   with increased left atrial pressure. - Pulmonary arteries: Systolic pressure was moderately to severely   increased.  Review of systems complete and found to be negative unless listed in HPI.   Past Medical History:  Diagnosis Date  . Adenomatous colon polyp   . Cervical radiculopathy at C5   . Chronic systolic heart failure ([place])   . CKD (chronic kidney disease), stage III ([place])   . DDD (degenerative disc disease), cervical   . DDD (degenerative disc disease), lumbar   . Fibroid   . GERD (gastroesophageal reflux disease)   . Hearing loss 02/2010   L hearing loss/vertigo, steroids  . History of seizure disorder    Related Hx  . Mitral regurgitation    moderate  . Multiple sclerosis ([place]) 1984  . Nonischemic cardiomyopathy ([facility])    Moderate LVEF 35-40% by ECHO 2011, 25-30% by echo 2013  . Permanent atrial fibrillation   . Pulmonary hypertension, secondary 06/04/2013   As a result of nonischemic cardiomyopathy EF 25-30%  . Stroke [facility])    Right brain CVA, complete recovery 07/2003  . Ventricular tachycardia ([place])   . Vertigo 02/2010   L hearing loss/vertigo, steroids    Current Outpatient Medications  Medication Sig Dispense Refill  . acetaminophen (TYLENOL) 500 MG tablet Take 500-1,000 mg by mouth every 6 (six) hours as needed (for back pain).    . ALPHAGAN P 0.1 % SOLN Place 1 drop into both eyes 2 (two) times daily.     [name] amiodarone (PACERONE) 200 MG tablet Take 0.5 tablets (100 mg total) by mouth daily. 45 tablet 3  . apixaban (ELIQUIS) 5 MG TABS tablet Take 1 tablet (5 mg total) by mouth 2 (two) times daily. 60 tablet 6  . Biotin 2500 MCG CAPS Take 2,500 mcg by mouth at bedtime.    . Calcium Carbonate-Vitamin D (CALCIUM + D PO) Take 1 tablet by mouth  every evening.     . carvedilol (COREG) 3.125 MG tablet TAKE 1 TABLET BY MOUTH TWICE A DAY 180 tablet 1  . fexofenadine (ALLEGRA) 180 MG tablet Take 180 mg by mouth daily as needed for allergies.     . furosemide (LASIX) 40 MG tablet TAKE 1 TABLET BY MOUTH  DAILY. MAY TAKE EXTRA 1/2  TABLET AS NEEDED FOR  SWELLING. 135 tablet 3  . levothyroxine (SYNTHROID, LEVOTHROID) 50 MCG tablet Take 50 mcg by mouth daily before breakfast.     . loratadine (CLARITIN) 10 MG tablet Take 10 mg by mouth daily as needed for allergies.    . Magnesium 500 MG TABS  Take 500 mg by mouth at bedtime.    . NON FORMULARY Apply 1 application topically 2 (two) times daily. [facility]  Onychomycosis Nail Lacquer -  Fluconazole 2%, Terbinafine 1% DMSO Apply to affected nail twice daily Qty. 120 gm 3 refills    . potassium chloride (K-DUR) 10 MEQ tablet TAKE TWO (2) TABLETS BY MOUTH DAILY 180 tablet 2  . sacubitril-valsartan (ENTRESTO) 49-51 MG Take 1 tablet by mouth 2 (two) times daily. 60 tablet 6   No current facility-administered medications for this encounter.     No Known Allergies    Social History   Socioeconomic History  . Marital status: Married    Spouse name: Not on file  . Number of children: Not on file  . Years of education: Not on file  . Highest education level: Not on file  Occupational History  . Not on file  Social Needs  . Financial resource strain: Not on file  . Food insecurity:    Worry: Not on file    Inability: Not on file  . Transportation needs:    Medical: Not on file    Non-medical: Not on file  Tobacco Use  . Smoking status: Never Smoker  . Smokeless tobacco: Never Used  Substance and Sexual Activity  . Alcohol use: Yes    Comment: rare  . Drug use: No  . Sexual activity: Yes    Birth control/protection: Post-menopausal  Lifestyle  . Physical activity:    Days per week: Not on file    Minutes per session: Not on file  . Stress: Not on file  Relationships  .  Social connections:    Talks on phone: Not on file    Gets together: Not on file    Attends religious service: Not on file    Active member of club or organization: Not on file    Attends meetings of clubs or organizations: Not on file    Relationship status: Not on file  . Intimate partner violence:    Fear of current or ex partner: Not on file    Emotionally abused: Not on file    Physically abused: Not on file    Forced sexual activity: Not on file  Other Topics Concern  . Not on file  Social History Narrative  . Not on file      Family History  Problem Relation Age of Onset  . Diabetes Mother   . Multiple sclerosis Mother   . Cancer Father        ?  . CAD Unknown        Father's side CAD    Vitals:   08/30/18 1319  BP: (!) 87/57  Pulse: 60  SpO2: 100%  Weight: 62 kg (136 lb 9.6 oz)  Height: 5\' 3"  (1.6 m)   Wt Readings from Last 3 Encounters:  08/30/18 62 kg (136 lb 9.6 oz)  07/30/18 61.1 kg (134 lb 12.8 oz)  07/25/18 62.8 kg (138 lb 6.4 oz)    PHYSICAL EXAM: General:  Well appearing. No resp difficulty HEENT: normal Neck: supple. no JVD. Carotids 2+ bilat; no bruits. No lymphadenopathy or thryomegaly appreciated. Cor: PMI laterally displaced. Regular rate & rhythm. 2/6 MR Lungs: clear Abdomen: soft, nontender, nondistended. No hepatosplenomegaly. No bruits or masses. Good bowel sounds. Extremities: no cyanosis, clubbing, rash, edema Neuro: alert & orientedx3, cranial nerves grossly intact. moves all 4 extremities w/o difficulty. Affect pleasant   ASSESSMENT & PLAN:  1. Chronic Systolic Heart  Failure due to NICM. S/p Medtronic BiV ICD. Likely familial CM.  - Echo 12/14/17: EF 20-25%, trivial AI, mild to mod MR, mildly reduced RV, severely dilated RA and LA, PA pressures moderately to severely increased.  - CPX 12/27/17: Peak VO2 13.5, slope 42 - CPX 05/16/18: pVO2 12.1, slope 48 - LHC 02/02/18 with normal coronaries. RHC showed severe NICM with well  compensated hemodynamics. - Repeat RHC 05/17/18: NICM with elevated filling pressures and relatively preserved output, suggestive of significant MR - Chronic NYHA III symptoms - She has been seen by Dr. Mosetta Pigeon at [facility] and felt not to be candidate for heart-kidney transplant but might be candidate for high-risk VAD.  - Blood type AB-pos  - Volume status stable. - Continue lasix 40 mg daily - Continue coreg 3.125 mg BID - Continue entresto 49/51 mg BID - Hold off on spiro with borderline creatinine and soft BP. - Extensive discussion today with me and VAD team regarding VAD therapies and the surgical procedure and living with a VAD. Both her and her husband have questions about why she was turned down for transplant and if they can get a second opinion. We discussed the issue of her renal function and possibility of worsening renal function and possible ESRD with transplant or VAD surgery. We also discussed various transplant centers in the state as well as concept of dual organ transplantation. They would like referral to [facility] to have another transplant opinion. We will facilitate this.   2. PAF - Remains in NSR on amio 100 daily - Continue Eliquis 5 bid. If weight decreases to  < 60kg will need to cut to 2.5mg  bid.  3. CKD, baseline creatinine ~1.7-1.9 - Creatinine 10/19 stable 1.67 (GFR 31) - Will recheck  4. MR - Mild to moderate on most recent echo  6. Hx of VT s/p Medtronic BiV ICD - Continue amiodarone 100 mg daily. No change.  Total time spent 60 minutes. Over half that time spent discussing above.   Glori Bickers, MD  7:22 PM

## 2018-09-03 ENCOUNTER — Encounter (HOSPITAL_COMMUNITY): Payer: Self-pay | Admitting: *Deleted

## 2018-09-03 ENCOUNTER — Telehealth: Payer: Self-pay

## 2018-09-03 NOTE — Progress Notes (Signed)
Packet faxed and emailed to [facility]) per Dr. Clayborne Dana request to heart transplant team. Referral for evaluation of heart/kidney transplant second opinion per pt/husband request, attn: Dr. Pilar Plate.    Zada Girt RN, [place] Coordinator [phone]

## 2018-09-03 NOTE — Telephone Encounter (Signed)
Left a VM for Ms. Kretsch to call back regarding the 12-week PREP at the [facility].

## 2018-09-06 ENCOUNTER — Ambulatory Visit: Payer: Medicare Other

## 2018-09-06 ENCOUNTER — Ambulatory Visit (INDEPENDENT_AMBULATORY_CARE_PROVIDER_SITE_OTHER): Payer: Medicare Other

## 2018-09-06 DIAGNOSIS — Z95810 Presence of automatic (implantable) cardiac defibrillator: Secondary | ICD-10-CM

## 2018-09-06 DIAGNOSIS — I5022 Chronic systolic (congestive) heart failure: Secondary | ICD-10-CM

## 2018-09-06 DIAGNOSIS — I428 Other cardiomyopathies: Secondary | ICD-10-CM

## 2018-09-07 ENCOUNTER — Encounter: Payer: Self-pay | Admitting: Cardiology

## 2018-09-07 NOTE — Progress Notes (Signed)
Remote ICD transmission.   

## 2018-09-09 LAB — CUP PACEART REMOTE DEVICE CHECK
Battery Remaining Longevity: 53 mo
Battery Voltage: 2.97 V
Brady Statistic AP VP Percent: 98.97 %
Brady Statistic AP VS Percent: 0.97 %
Brady Statistic AS VP Percent: 0.05 %
Brady Statistic AS VS Percent: 0.01 %
Brady Statistic RA Percent Paced: 99.9 %
Brady Statistic RV Percent Paced: 92.16 %
Date Time Interrogation Session: 20200123072605
HighPow Impedance: 41 Ohm
Implantable Lead Implant Date: 20091104
Implantable Lead Implant Date: 20091104
Implantable Lead Implant Date: 20171214
Implantable Lead Location: 753858
Implantable Lead Location: 753859
Implantable Lead Location: 753860
Implantable Lead Model: 4196
Implantable Lead Model: 5076
Implantable Pulse Generator Implant Date: 20171214
Lead Channel Impedance Value: 1064 Ohm
Lead Channel Impedance Value: 399 Ohm
Lead Channel Impedance Value: 418 Ohm
Lead Channel Impedance Value: 475 Ohm
Lead Channel Impedance Value: 532 Ohm
Lead Channel Impedance Value: 646 Ohm
Lead Channel Pacing Threshold Amplitude: 0.375 V
Lead Channel Pacing Threshold Amplitude: 0.625 V
Lead Channel Pacing Threshold Amplitude: 2.125 V
Lead Channel Pacing Threshold Pulse Width: 0.4 ms
Lead Channel Pacing Threshold Pulse Width: 0.4 ms
Lead Channel Pacing Threshold Pulse Width: 0.6 ms
Lead Channel Sensing Intrinsic Amplitude: 6.25 mV
Lead Channel Sensing Intrinsic Amplitude: 6.25 mV
Lead Channel Setting Pacing Amplitude: 2 V
Lead Channel Setting Pacing Amplitude: 2.5 V
Lead Channel Setting Pacing Amplitude: 3.25 V
Lead Channel Setting Pacing Pulse Width: 0.4 ms
Lead Channel Setting Pacing Pulse Width: 0.6 ms
Lead Channel Setting Sensing Sensitivity: 0.3 mV

## 2018-09-10 ENCOUNTER — Telehealth: Payer: Self-pay

## 2018-09-10 NOTE — Telephone Encounter (Signed)
Remote ICM transmission received.  Attempted call to patient regarding ICM remote transmission and left detailed message, per DPR, with next ICM remote transmission date of 10/09/2018.  Advised to return call for any fluid symptoms or questions.

## 2018-09-10 NOTE — Progress Notes (Signed)
EPIC Encounter for ICM Monitoring  Patient Name: Kristi Parker is a 68 y.o. female Date: 09/10/2018 Primary Care Physican: [name], MD Primary Cardiologist:[name] Electrophysiologist: Allred Bi-V Pacing: 92.2% Last Weight:133lbs (baseline131-133lbs) Today's Weight:unknown  Clinical Status (02-Aug-2018 to 06-Sep-2018)  Treated VT/VF 0 episodes  AT/AF 3 episodes   Time in AT/AF <0.1 hr/day (<0.1%)   Longest AT/AF 3 minutes   Attempted call to patient.  Left detailed message per DPR regarding transmission. Transmission reviewed.   Thoracic impedance normal.   Prescribed:Furosemide40 mg-Take 1 tablet by mouth daily. May take extra 1/2 tablet as needed for swelling.  Labs: 05/23/2018 Creatinine 1.67, BUN 25, Potassium 4.0, Sodium 141, [id number] 05/17/2018 Creatinine 1.66, BUN 22, Potassium 4.2, Sodium 141, [id number] 04/05/2018 Creatinine1.85, BUN23, Potassium4.1, Sodium140, [id number] 03/21/2018 Creatinine1.70, BUN17, Potassium4.2, Sodium141, [id number]  02/02/2018 Creatinine1.86, BUN19, Potassium4.0, Sodium140, [id number]  01/12/2018 Creatinine2.04, BUN18, Potassium4.2, Sodium141, [id number]  12/14/2017 Creatinine1.59, BUN14, Potassium4.0, [id number], [id number] A complete set of results can be found in Results Review.  Recommendations:Left voice mail with ICM number and encouraged to call if experiencing any fluid symptoms.  Follow-up plan: ICM clinic phone appointment on2/25/2020 Office appointment scheduled 09/28/2018 with Dr Haroldine Laws.  Copy of ICM check sent to Dr.Allred.  3 month ICM trend: 09/06/2018    1 Year ICM trend:       [name], RN 09/10/2018 12:28 PM

## 2018-09-28 ENCOUNTER — Other Ambulatory Visit: Payer: Self-pay

## 2018-09-28 ENCOUNTER — Ambulatory Visit (HOSPITAL_COMMUNITY)
Admission: RE | Admit: 2018-09-28 | Discharge: 2018-09-28 | Disposition: A | Discharge status: Home / Self Care | Payer: Medicare Other | Source: Ambulatory Visit | Attending: Internal Medicine | Admitting: Internal Medicine

## 2018-09-28 VITALS — BP 92/54 | HR 86 | Wt 138.0 lb

## 2018-09-28 DIAGNOSIS — N184 Chronic kidney disease, stage 4 (severe): Secondary | ICD-10-CM

## 2018-09-28 DIAGNOSIS — I272 Pulmonary hypertension, unspecified: Secondary | ICD-10-CM | POA: Diagnosis not present

## 2018-09-28 DIAGNOSIS — Z8673 Personal history of transient ischemic attack (TIA), and cerebral infarction without residual deficits: Secondary | ICD-10-CM | POA: Diagnosis not present

## 2018-09-28 DIAGNOSIS — M5136 Other intervertebral disc degeneration, lumbar region: Secondary | ICD-10-CM | POA: Diagnosis not present

## 2018-09-28 DIAGNOSIS — G35 Multiple sclerosis: Secondary | ICD-10-CM | POA: Diagnosis not present

## 2018-09-28 DIAGNOSIS — I340 Nonrheumatic mitral (valve) insufficiency: Secondary | ICD-10-CM | POA: Insufficient documentation

## 2018-09-28 DIAGNOSIS — Z79899 Other long term (current) drug therapy: Secondary | ICD-10-CM | POA: Diagnosis not present

## 2018-09-28 DIAGNOSIS — I428 Other cardiomyopathies: Secondary | ICD-10-CM | POA: Diagnosis not present

## 2018-09-28 DIAGNOSIS — I5022 Chronic systolic (congestive) heart failure: Secondary | ICD-10-CM

## 2018-09-28 DIAGNOSIS — I4821 Permanent atrial fibrillation: Secondary | ICD-10-CM | POA: Insufficient documentation

## 2018-09-28 DIAGNOSIS — Z7901 Long term (current) use of anticoagulants: Secondary | ICD-10-CM | POA: Diagnosis not present

## 2018-09-28 DIAGNOSIS — G40909 Epilepsy, unspecified, not intractable, without status epilepticus: Secondary | ICD-10-CM | POA: Diagnosis not present

## 2018-09-28 LAB — BASIC METABOLIC PANEL
Anion gap: 10 (ref 5–15)
BUN: 23 mg/dL (ref 8–23)
CO2: 24 mmol/L (ref 22–32)
Calcium: 9.5 mg/dL (ref 8.9–10.3)
Chloride: 107 mmol/L (ref 98–111)
Creatinine, Ser: 1.95 mg/dL — ABNORMAL HIGH (ref 0.44–1.00)
GFR calc Af Amer: 30 mL/min — ABNORMAL LOW (ref >60)
GFR calc non Af Amer: 26 mL/min — ABNORMAL LOW (ref >60)
Glucose, Bld: 103 mg/dL — ABNORMAL HIGH (ref 70–99)
Potassium: 4.5 mmol/L (ref 3.5–5.1)
Sodium: 141 mmol/L (ref 135–145)

## 2018-09-28 NOTE — Patient Instructions (Addendum)
Labs were done today. We will call you with any ABNORMAL results. No news is good news!  Your physician recommends that you schedule a follow-up appointment in: 2 MONTHS.

## 2018-09-28 NOTE — Progress Notes (Signed)
Advanced Heart Failure Clinic Note   Primary cardiologist: Dr. Marlou Porch EP: Dr. Rayann Heman Referring Physician: Dr Rayann Heman PCP: [name], MD   HPI: Kristi Parker is a 68 y.o. female with history of VT, NICM, Medtronic BiV ICD, pulmonary HTN, CVA 2004, PAF, and CKD III-IV.   She was first diagnosed with HF in 2013. She is unsure of the cause. She had a heart cath and did not have any blockages. We also follow her son in HF clinic for NICM. She states that multiple family members have severe HF and several have underwent transplant.  Based on CPX testing, we referred her to Dr. Mosetta Pigeon at [facility]. She was seen there on 02/22/18 and they discussed need for advanced therapies including possible heart or heart-kidney transplant. She had f/u with them subsequently and felt not to be candidate for heart-kidney transplant but might be candidate for high-risk VAD.   Had repeat CPX in 10/19 with low Vo2 (13.5) and high slope (42). Repeat RHC done on 10/3 as below as part of transplant work up at [facility] showed elevated volume with CI 2.7  She returns today for HF follow up. Kristi Parker turned her down for second opinion for heart/kidney transplant due to age. Overall doing about the same. Able to get around store slowly with out breaks. Continues to work out on stationary bike 3 days/week. Starting a program at the [facility] 2x/week at the end of the month. Continues to have good and bad days regarding fatigue. No edema, orthopnea, or PND. Appetite okay. No bleeding on Eliquis. Took an extra lasix this week with weight gain and more swelling in legs, but doesn't take regularly. Weights 132->135 lbs today. She plans to take an extra lasix today as well. Taking all medications. Limits fluid and salt intake. She feels like she is getting better at being able to tell when she needs extra lasix.   Medtronic ICD interrogation: No recent AF/AT. No VT/VF. BiV pacing ~100%. Thoracic impedence just below threshold, but trending up.  Small amount of fluid accumulation mid Jan to early Feb.   SH: Lives at home with her husband. No ETOH, tobacco, or drugs. Able to afford medications. Used to work for [employer].   FH: She has several family members on her father's side who have HF, two of which have required heart transplants. Her son also has HF.  [place] 05/17/18: RA = 12 RV = 53/12 PA = 51/22 (34) PCW = mean 28 (v = 35-40) Fick cardiac output/index = 4.4/2.7 PVR = 1.4 WU FA sat = 98% PA sat = 62%, 63% PAPi = 2.4 RA/PCWP = 0.42 Assessment: 1. NICM with elevated filling pressures and relatively preserved output 2. Prominent v-waves in PCWP tracing suggestive of significant MR  Plan/Discussion: Increase diuretics. Continue transplant w/u based on CPX results.   Cath 02/02/18 Normal coronaries Ao = 97/55 (72) LV = 99/18 RA = 8 RV = 42/9 PA = 42/17 (27) PCW = 20 ( V waves to 35) Fick cardiac output/index = 5.2/3.3 PVR = 1.4 WU Ao sat = 95% PA sat = 66%, 69% PAPi = 3.1 RA/PCWP = 0.40  Cath 10/19 Findings:  RA = 12 RV = 53/12 PA = 51/22 (34) PCW = mean 28 (v = 35-40) Fick cardiac output/index = 4.4/2.7 PVR = 1.4 WU FA sat = 98% PA sat = 62%, 63% PAPi = 2.4 RA/PCWP = 0.42  CPX 12/27/17: Peak VO2: 13.5 (60% predicted peak VO2) VE/VCO2 slope: 42 OUES: 0.77  Peak RER: 1.19 Ventilatory Threshold: 9.7 (43% predicted and 72% measured peak VO2) Peak RR 29 Peak Ventilation: 37.1 VE/MVV: 57% PETCO2 at peak: 27 O2pulse: 7  (88% predicted O2pulse)  CPX 05/16/18  FVC 1.81 (73%)    FEV1 1.50 (77%)     FEV1/FVC 83 (104%)     MVV 73 (84%)  Resting HR: 63 Peak HR: 118  (77% age predicted max HR)  BP rest: 88/64 BP peak: 130/64 Peak VO2: 12.1 (55% predicted peak VO2) VE/VCO2 slope: 48 OUES: 0.77 Peak RER: 1.25 Ventilatory Threshold: 9.2 (42% predicted and 76% measured peak VO2) VE/MVV: 63% O2pulse: 7  (79% predicted O2pulse)  Echo 12/14/17: - Left ventricle: The  cavity size was mildly dilated. Wall   thickness was normal. Systolic function was severely reduced. The   estimated ejection fraction was in the range of 20% to 25%.   Diffuse hypokinesis. The study is not technically sufficient to   allow evaluation of LV diastolic function. - Aortic valve: There was trivial regurgitation. - Mitral valve: Calcified annulus. Mildly thickened leaflets .   There was mild to moderate regurgitation. - Left atrium: The atrium was severely dilated. - Right ventricle: Systolic function was mildly reduced. - Right atrium: The atrium was severely dilated. - Atrial septum: The septum bowed from left to right, consistent   with increased left atrial pressure. - Pulmonary arteries: Systolic pressure was moderately to severely   increased.  Review of systems complete and found to be negative unless listed in HPI.   Past Medical History:  Diagnosis Date  . Adenomatous colon polyp   . Cervical radiculopathy at C5   . Chronic systolic heart failure ([place])   . CKD (chronic kidney disease), stage III ([place])   . DDD (degenerative disc disease), cervical   . DDD (degenerative disc disease), lumbar   . Fibroid   . GERD (gastroesophageal reflux disease)   . Hearing loss 02/2010   L hearing loss/vertigo, steroids  . History of seizure disorder    Related Hx  . Mitral regurgitation    moderate  . Multiple sclerosis ([place]) 1984  . Nonischemic cardiomyopathy ([facility])    Moderate LVEF 35-40% by ECHO 2011, 25-30% by echo 2013  . Permanent atrial fibrillation   . Pulmonary hypertension, secondary 06/04/2013   As a result of nonischemic cardiomyopathy EF 25-30%  . Stroke [facility])    Right brain CVA, complete recovery 07/2003  . Ventricular tachycardia ([place])   . Vertigo 02/2010   L hearing loss/vertigo, steroids    Current Outpatient Medications  Medication Sig Dispense Refill  . acetaminophen (TYLENOL) 500 MG tablet Take 500-1,000 mg by mouth every 6 (six) hours as needed  (for back pain).    . ALPHAGAN P 0.1 % SOLN Place 1 drop into both eyes 2 (two) times daily.     [name] amiodarone (PACERONE) 200 MG tablet Take 0.5 tablets (100 mg total) by mouth daily. 45 tablet 3  . apixaban (ELIQUIS) 5 MG TABS tablet Take 1 tablet (5 mg total) by mouth 2 (two) times daily. 60 tablet 6  . Biotin 2500 MCG CAPS Take 2,500 mcg by mouth at bedtime.    . Calcium Carbonate-Vitamin D (CALCIUM + D PO) Take 1 tablet by mouth every evening.     . carvedilol (COREG) 3.125 MG tablet TAKE 1 TABLET BY MOUTH TWICE A DAY 180 tablet 1  . fexofenadine (ALLEGRA) 180 MG tablet Take 180 mg by mouth daily as needed for allergies.     [name]  furosemide (LASIX) 40 MG tablet TAKE 1 TABLET BY MOUTH  DAILY. MAY TAKE EXTRA 1/2  TABLET AS NEEDED FOR  SWELLING. 135 tablet 3  . levothyroxine (SYNTHROID, LEVOTHROID) 50 MCG tablet Take 50 mcg by mouth daily before breakfast.     . loratadine (CLARITIN) 10 MG tablet Take 10 mg by mouth daily as needed for allergies.    . Magnesium 500 MG TABS Take 500 mg by mouth at bedtime.    . NON FORMULARY Apply 1 application topically 2 (two) times daily. [facility]  Onychomycosis Nail Lacquer -  Fluconazole 2%, Terbinafine 1% DMSO Apply to affected nail twice daily Qty. 120 gm 3 refills    . potassium chloride (K-DUR) 10 MEQ tablet TAKE TWO (2) TABLETS BY MOUTH DAILY 180 tablet 2  . sacubitril-valsartan (ENTRESTO) 49-51 MG Take 1 tablet by mouth 2 (two) times daily. 60 tablet 6   No current facility-administered medications for this encounter.     No Known Allergies    Social History   Socioeconomic History  . Marital status: Married    Spouse name: Not on file  . Number of children: Not on file  . Years of education: Not on file  . Highest education level: Not on file  Occupational History  . Not on file  Social Needs  . Financial resource strain: Not on file  . Food insecurity:    Worry: Not on file    Inability: Not on file  . Transportation needs:     Medical: Not on file    Non-medical: Not on file  Tobacco Use  . Smoking status: Never Smoker  . Smokeless tobacco: Never Used  Substance and Sexual Activity  . Alcohol use: Yes    Comment: rare  . Drug use: No  . Sexual activity: Yes    Birth control/protection: Post-menopausal  Lifestyle  . Physical activity:    Days per week: Not on file    Minutes per session: Not on file  . Stress: Not on file  Relationships  . Social connections:    Talks on phone: Not on file    Gets together: Not on file    Attends religious service: Not on file    Active member of club or organization: Not on file    Attends meetings of clubs or organizations: Not on file    Relationship status: Not on file  . Intimate partner violence:    Fear of current or ex partner: Not on file    Emotionally abused: Not on file    Physically abused: Not on file    Forced sexual activity: Not on file  Other Topics Concern  . Not on file  Social History Narrative  . Not on file      Family History  Problem Relation Age of Onset  . Diabetes Mother   . Multiple sclerosis Mother   . Cancer Father        ?  . CAD Unknown        Father's side CAD    Vitals:   09/28/18 1052  BP: (!) 92/54  Pulse: 86  SpO2: 98%  Weight: 62.6 kg (138 lb)   Wt Readings from Last 3 Encounters:  09/28/18 62.6 kg (138 lb)  08/30/18 62 kg (136 lb 9.6 oz)  07/30/18 61.1 kg (134 lb 12.8 oz)    PHYSICAL EXAM: General: Well appearing. No resp difficulty. HEENT: Normal Neck: Supple. JVP 8-10. Carotids 2+ bilat; no bruits. No thyromegaly or  nodule noted. Cor: PMI laterally displaced. RRR, 2/6 MR Lungs: CTAB, normal effort. Abdomen: Soft, non-tender, non-distended, no HSM. No bruits or masses. +BS  Extremities: No cyanosis, clubbing, or rash. R and LLE no edema.  Neuro: Alert & orientedx3, cranial nerves grossly intact. moves all 4 extremities w/o difficulty. Affect pleasant  ASSESSMENT & PLAN:  1. Chronic Systolic  Heart Failure due to NICM. S/p Medtronic BiV ICD. Likely familial CM.  - Echo 12/14/17: EF 20-25%, trivial AI, mild to mod MR, mildly reduced RV, severely dilated RA and LA, PA pressures moderately to severely increased.  - CPX 12/27/17: Peak VO2 13.5, slope 42 - CPX 05/16/18: pVO2 12.1, slope 48 - LHC 02/02/18 with normal coronaries. RHC showed severe NICM with well compensated hemodynamics. - Repeat RHC 05/17/18: NICM with elevated filling pressures and relatively preserved output, suggestive of significant MR - Chronic NYHA III symptoms - She has been seen by Dr. Mosetta Pigeon at [facility] and felt not to be candidate for heart-kidney transplant but might be candidate for high-risk VAD. [facility] declined seeing her for heart-kidney transplant due to age.  - Blood type AB-pos  - Volume status mildly elevated on exam.  - Continue lasix 40 mg daily. She takes an extra 20 mg PRN and plans to take today.  - Continue coreg 3.125 mg BID - Continue entresto 49/51 mg BID - Hold off on spiro with borderline creatinine and soft BP. - Turned down for transplant eval by [facility] and by [facility]. Dr Haroldine Laws had a long discussion with her and her husband today about possible VAD and whether or not she would want this. She wants to think about it. Will plan to continue medical therapy for now and check CPX in 4-6 months.   2. PAF - Remains in NSR on amio 100 daily. Regular on exam.  - Continue Eliquis 5 bid. If weight decreases to  < 60kg will need to cut to 2.5mg  bid.  3. CKD, baseline creatinine ~1.7-1.9 - Creatinine 10/19 stable 1.67 (GFR 31) - BMET today.   4. MR - Mild to moderate on most recent echo. No change.   6. Hx of VT s/p Medtronic BiV ICD - Continue amiodarone 100 mg daily. No change.   BP too soft for medication titration today. Check BMET. Follow up in 2 months. Will decide timing of repeat CPX at that time, depending on how she is doing.   Georgiana Shore, NP  11:15 AM  Patient seen and examined with the  above-signed Advanced Practice Provider and/or Housestaff. I personally reviewed laboratory data, imaging studies and relevant notes. I independently examined the patient and formulated the important aspects of the plan. I have edited the note to reflect any of my changes or salient points. I have personally discussed the plan with the patient and/or family.  She continues with NYHA III symptoms despite severe LV dysfunction and marked HF limitation on CPX. Has been seen at [facility] and turned down for dual organ transplant.  I also discussed with [facility] who also turned her down for transplant eval.   We had long discussion about options. She is not sure she would want a VAD but hasn't completely ruled it out. We discussed concept of not missing her window of opportunity.   Will continue current therapy and I will see her q2 months for now. Repeat CPX in 84months. If getting worse will need to make a decision re VAD support. Watch renal function closely.   Total time spent 45  minutes. Over half that time spent discussing above.   Glori Bickers, MD  8:36 PM

## 2018-10-05 ENCOUNTER — Ambulatory Visit: Payer: Medicare Other | Admitting: Podiatry

## 2018-10-09 ENCOUNTER — Ambulatory Visit (INDEPENDENT_AMBULATORY_CARE_PROVIDER_SITE_OTHER): Payer: Medicare Other

## 2018-10-09 DIAGNOSIS — Z95810 Presence of automatic (implantable) cardiac defibrillator: Secondary | ICD-10-CM | POA: Diagnosis not present

## 2018-10-09 DIAGNOSIS — I5022 Chronic systolic (congestive) heart failure: Secondary | ICD-10-CM

## 2018-10-10 ENCOUNTER — Telehealth: Payer: Self-pay

## 2018-10-10 NOTE — Telephone Encounter (Signed)
Remote ICM transmission received.  Attempted call to patient regarding ICM remote transmission and no answer or answering machine. 

## 2018-10-10 NOTE — Progress Notes (Signed)
EPIC Encounter for ICM Monitoring  Patient Name: Kristi Parker is a 68 y.o. female Date: 10/10/2018 Primary Care Physican: [name], MD Primary Cardiologist:[name] Electrophysiologist: Allred Bi-V Pacing: 92.7% Last Weight:133lbs (baseline131-133lbs) Today's Weight:unknown        Attempted call to patient.  Left detailed message per DPR regarding transmission. Transmission reviewed.   Thoracic impedance normal.   Prescribed:Furosemide40 mg-Take 1 tablet by mouth daily. May take extra 1/2 tablet as needed for swelling.  Labs: 09/28/2018 Creatinine 1.95, BUN 23, Potassium 4.5, Sodium 141, GFR 26-30 05/23/2018 Creatinine 1.67, BUN 25, Potassium 4.0, Sodium 141, GFR31-36 05/17/2018 Creatinine 1.66, BUN 22, Potassium 4.2, Sodium 141, GFR31-36 04/05/2018 Creatinine1.85, BUN23, Potassium4.1, Sodium140, [id number] 03/21/2018 Creatinine1.70, BUN17, Potassium4.2, Sodium141, [id number]  02/02/2018 Creatinine1.86, BUN19, Potassium4.0, Sodium140, [id number]  01/12/2018 Creatinine2.04, BUN18, Potassium4.2, Sodium141, [id number]  12/14/2017 Creatinine1.59, BUN14, Potassium4.0, [id number], [id number] A complete set of results can be found in Results Review.  Recommendations:Left voice mail with ICM number and encouraged to call if experiencing any fluid symptoms.  Follow-up plan: ICM clinic phone appointment on3/30/2020Office appointment scheduled 4/14/2020with Dr Haroldine Laws.  Copy of ICM check sent to Dr.Allred.  3 month ICM trend: 10/09/2018    1 Year ICM trend:       [name], RN 10/10/2018 10:07 AM

## 2018-10-25 ENCOUNTER — Encounter: Payer: Self-pay | Admitting: Podiatry

## 2018-10-25 ENCOUNTER — Other Ambulatory Visit: Payer: Self-pay

## 2018-10-25 ENCOUNTER — Ambulatory Visit: Payer: Medicare Other | Admitting: Podiatry

## 2018-10-25 DIAGNOSIS — D689 Coagulation defect, unspecified: Secondary | ICD-10-CM

## 2018-10-25 DIAGNOSIS — B351 Tinea unguium: Secondary | ICD-10-CM

## 2018-10-25 NOTE — Progress Notes (Signed)
Subjective: 68 y.o. returns the office today for painful, elongated, thickened toenails which she cannot trim herself and she has been using a topical antifungal which is been helping her nails some, mostly her big toenails. She states the nails are doing a lot better and no longer hurting. Denies any surrounding redness or drainage.  Her nails get sensitive when getting elongated.  She has no new concerns today.  PCP: [name], MD  She is on coumadin  Objective: AAO 3, NAD DP pulses 2/4, PT 1/4, CRT less than 3 seconds- unchanged and denies any claudication symptoms Nails hypertrophic, dystrophic, elongated, brittle, discolored 10. The color to the nails is much improved.  There is no tenderness overlying the nails 1-5 bilaterally. There is no surrounding erythema or drainage along the nail sites. No open lesions or pre-ulcerative lesions are identified. No pain with calf compression, swelling, warmth, erythema.  Assessment: Patient presents with improving onychomycosis  Plan: -Treatment options including alternatives, risks, complications were discussed -Nails sharply debrided 10 without complication/bleeding. -Continue topical antifungal. -Discussed daily foot inspection. If there are any changes, to call the office immediately.  -Follow-up in 3 months if she wants for nail debridement or sooner if any problems are to arise. In the meantime, encouraged to call the office with any questions, concerns, changes symptoms.  Celesta Gentile, DPM

## 2018-10-31 ENCOUNTER — Telehealth: Payer: Self-pay

## 2018-10-31 NOTE — Telephone Encounter (Signed)
Spoke to Kristi Parker about the temporary closure of the [facility] and postponement of the PREP.  Will call back when there is a reopening date.

## 2018-11-12 ENCOUNTER — Ambulatory Visit (INDEPENDENT_AMBULATORY_CARE_PROVIDER_SITE_OTHER): Payer: Medicare Other

## 2018-11-12 ENCOUNTER — Other Ambulatory Visit: Payer: Self-pay

## 2018-11-12 DIAGNOSIS — I5022 Chronic systolic (congestive) heart failure: Secondary | ICD-10-CM | POA: Diagnosis not present

## 2018-11-12 DIAGNOSIS — Z95810 Presence of automatic (implantable) cardiac defibrillator: Secondary | ICD-10-CM | POA: Diagnosis not present

## 2018-11-13 NOTE — Progress Notes (Signed)
EPIC Encounter for ICM Monitoring  Patient Name: Kristi Parker is a 68 y.o. female Date: 11/13/2018 Primary Care Physican: [name], MD Primary Cardiologist:[name] Electrophysiologist: Allred Bi-V Pacing: 96.1% Last Weight:133lbs (baseline131-133lbs)         Transmission reviewed.   Thoracic impedance normal.   Prescribed:Furosemide40 mg-Take 1 tablet by mouth daily. May take extra 1/2 tablet as needed for swelling.  Labs: 09/28/2018 Creatinine 1.95, BUN 23, Potassium 4.5, Sodium 141, GFR 26-30 05/23/2018 Creatinine 1.67, BUN 25, Potassium 4.0, Sodium 141, GFR31-36 05/17/2018 Creatinine 1.66, BUN 22, Potassium 4.2, Sodium 141, GFR31-36 04/05/2018 Creatinine1.85, BUN23, Potassium4.1, Sodium140, [id number] 03/21/2018 Creatinine1.70, BUN17, Potassium4.2, Sodium141, [id number]  02/02/2018 Creatinine1.86, BUN19, Potassium4.0, Sodium140, [id number]  01/12/2018 Creatinine2.04, BUN18, Potassium4.2, Sodium141, [id number]  12/14/2017 Creatinine1.59, BUN14, Potassium4.0, [id number], [id number] A complete set of results can be found in Results Review.  Recommendations: None.  Follow-up plan: ICM clinic phone appointment on5/4/2020Office appointment scheduled 4/14/2020withDr Bensimhon.  Copy of ICM check sent to Dr.Allred.  3 month ICM trend: 11/12/2018    1 Year ICM trend:       [name], RN 11/13/2018 12:46 PM

## 2018-11-27 ENCOUNTER — Encounter (HOSPITAL_COMMUNITY): Payer: Medicare Other | Admitting: Internal Medicine

## 2018-12-03 ENCOUNTER — Ambulatory Visit (HOSPITAL_COMMUNITY)
Admission: RE | Admit: 2018-12-03 | Discharge: 2018-12-03 | Disposition: A | Discharge status: Home / Self Care | Payer: Medicare Other | Source: Ambulatory Visit | Attending: Internal Medicine | Admitting: Internal Medicine

## 2018-12-03 ENCOUNTER — Other Ambulatory Visit: Payer: Self-pay

## 2018-12-03 DIAGNOSIS — I5022 Chronic systolic (congestive) heart failure: Secondary | ICD-10-CM | POA: Diagnosis not present

## 2018-12-03 DIAGNOSIS — I480 Paroxysmal atrial fibrillation: Secondary | ICD-10-CM

## 2018-12-03 DIAGNOSIS — N184 Chronic kidney disease, stage 4 (severe): Secondary | ICD-10-CM

## 2018-12-03 MED ORDER — APIXABAN 2.5 MG PO TABS: 2.5000 mg | ORAL_TABLET | Freq: Two times a day (BID) | ORAL | 0 refills | Status: DC

## 2018-12-03 NOTE — Addendum Note (Signed)
Encounter addended by: Jovita Kussmaul, RN on: 12/03/2018 1:56 PM  Actions taken: Pharmacy for encounter modified, Order list changed, Diagnosis association updated, Clinical Note Signed

## 2018-12-03 NOTE — Progress Notes (Addendum)
Heart Failure TeleHealth Note  Due to national recommendations of social distancing due to [place] 19, Audio/video telehealth visit is felt to be most appropriate for this patient at this time.  See MyChart message from today for patient consent regarding telehealth for [facility].  Date:  12/03/2018   ID:  Kristi Parker, DOB 20-Nov-1950, MRN [id number]  Location: Home  Provider location: [facility] Type of Visit: Established patient  PCP:  [name], MD  Cardiologist:  Candee Furbish, MD Primary HF: Nissa Stannard  Chief Complaint: Heart Failure follow-up   History of Present Illness:  Kristi Parker is a 68 y.o. female with history of VT, severe systolic HF due to NICM (EF 20-25%) s/p Medtronic BiV ICD, CVA 2004, PAF, and CKD III-IV.   She was first diagnosed with HF in 2013. She is unsure of the cause. She had a heart cath and did not have any blockages. We also follow her son in HF clinic for NICM. She states that multiple family members have severe HF and several have underwent transplant.  Based on CPX testing, we referred her to Dr. Mosetta Pigeon at [facility]. She was seen there on 02/22/18 and they discussed need for advanced therapies including possible heart or heart-kidney transplant. She had f/u with them subsequently and felt not to be candidate for heart-kidney transplant but might be candidate for high-risk VAD. We reviewed case with [facility] and she was also turned down for heart/kidney transplant due to age.  Had repeat CPX in 10/19 with low Vo2 (13.5) and high slope (42). Repeat RHC done on 10//193 as below as part of transplant work up at [facility] showed elevated volume with CI 2.7  She presents via Engineer, civil (consulting) for a telehealth visit today. Overall doing about the same. Can do all ADLs and get around the yard slowly. [facility] but now closed. Tries to do some exercises at home and uses her Hula-hoop. Anxious to go to HF  Exercise program with [name]. Got a scale and BP cuff from insurance company and following vitals daily. SBP 100-112 HR 60s Weight stable 132-134. Takes extra lasix if weight 135 or greater.   Medtronic ICD interrogation from 11/12/18: No recent AF/AT. No VT/VF. BiV pacing ~100%. Fluid looks very good.   VALYN LATCHFORD denies symptoms worrisome for COVID 19.   Past Medical History:  Diagnosis Date   Adenomatous colon polyp    Cervical radiculopathy at C5    Chronic systolic heart failure (HCC)    CKD (chronic kidney disease), stage III (HCC)    DDD (degenerative disc disease), cervical    DDD (degenerative disc disease), lumbar    Fibroid    GERD (gastroesophageal reflux disease)    Hearing loss 02/2010   L hearing loss/vertigo, steroids   History of seizure disorder    Related Hx   Mitral regurgitation    moderate   Multiple sclerosis ([place]) 1984   Nonischemic cardiomyopathy (HCC)    Moderate LVEF 35-40% by ECHO 2011, 25-30% by echo 2013   Permanent atrial fibrillation    Pulmonary hypertension, secondary 06/04/2013   As a result of nonischemic cardiomyopathy EF 25-30%   Stroke [facility])    Right brain CVA, complete recovery 07/2003   Ventricular tachycardia ([place])    Vertigo 02/2010   L hearing loss/vertigo, steroids   Past Surgical History:  Procedure Laterality Date   BIV PACEMAKER GENERATOR CHANGE OUT N/A 09/18/2014  Procedure: BIV PACEMAKER GENERATOR CHANGE OUT;  Surgeon: Thompson Grayer, MD;  Location: [facility] CATH LAB;  Service: Cardiovascular;  Laterality: N/A;   DILATION AND CURETTAGE OF UTERUS     EP IMPLANTABLE DEVICE N/A 07/28/2016   Procedure: ICD Implant;  Surgeon: Evans Lance, MD;  Location: [place] CV LAB;  Service: Cardiovascular;  Laterality: N/A;   HYSTEROSCOPY     PACEMAKER INSERTION     PTVP 08/2001 for complete heart block. Upgrade PTVP to MDT BiV 06/2008 by Dr Leonia Reeves   RIGHT HEART CATH N/A 05/17/2018   Procedure: RIGHT HEART  CATH;  Surgeon: Jolaine Artist, MD;  Location: [place] CV LAB;  Service: Cardiovascular;  Laterality: N/A;   RIGHT/LEFT HEART CATH AND CORONARY ANGIOGRAPHY N/A 02/02/2018   Procedure: RIGHT/LEFT HEART CATH AND CORONARY ANGIOGRAPHY;  Surgeon: Jolaine Artist, MD;  Location: [place] CV LAB;  Service: Cardiovascular;  Laterality: N/A;   TUBAL LIGATION       Current Outpatient Medications  Medication Sig Dispense Refill   acetaminophen (TYLENOL) 500 MG tablet Take 500-1,000 mg by mouth every 6 (six) hours as needed (for back pain).     ALPHAGAN P 0.1 % SOLN Place 1 drop into both eyes 2 (two) times daily.      amiodarone (PACERONE) 200 MG tablet Take 0.5 tablets (100 mg total) by mouth daily. 45 tablet 3   apixaban (ELIQUIS) 5 MG TABS tablet Take 1 tablet (5 mg total) by mouth 2 (two) times daily. 60 tablet 6   Biotin 2500 MCG CAPS Take 2,500 mcg by mouth at bedtime.     Calcium Carbonate-Vitamin D (CALCIUM + D PO) Take 1 tablet by mouth every evening.      carvedilol (COREG) 3.125 MG tablet TAKE 1 TABLET BY MOUTH TWICE A DAY 180 tablet 1   fexofenadine (ALLEGRA) 180 MG tablet Take 180 mg by mouth daily as needed for allergies.      furosemide (LASIX) 40 MG tablet TAKE 1 TABLET BY MOUTH  DAILY. MAY TAKE EXTRA 1/2  TABLET AS NEEDED FOR  SWELLING. 135 tablet 3   levothyroxine (SYNTHROID, LEVOTHROID) 50 MCG tablet Take 50 mcg by mouth daily before breakfast.      loratadine (CLARITIN) 10 MG tablet Take 10 mg by mouth daily as needed for allergies.     Magnesium 500 MG TABS Take 500 mg by mouth at bedtime.     NON FORMULARY Apply 1 application topically 2 (two) times daily. [facility]  Onychomycosis Nail Lacquer -  Fluconazole 2%, Terbinafine 1% DMSO Apply to affected nail twice daily Qty. 120 gm 3 refills     potassium chloride (K-DUR) 10 MEQ tablet TAKE TWO (2) TABLETS BY MOUTH DAILY 180 tablet 2   sacubitril-valsartan (ENTRESTO) 49-51 MG Take 1 tablet by  mouth 2 (two) times daily. 60 tablet 6   No current facility-administered medications for this encounter.     Allergies:   Patient has no known allergies.   Social History:  The patient  reports that she has never smoked. She has never used smokeless tobacco. She reports current alcohol use. She reports that she does not use drugs.   Family History:  The patient's family history includes CAD in her unknown relative; Cancer in her father; Diabetes in her mother; Multiple sclerosis in her mother.   ROS:  Please see the history of present illness.   All other systems are personally reviewed and negative.   Exam:  (Video/Tele Health Call; Exam  is subjective and or/visual.) General:  Speaks in full sentences. No resp difficulty. Lungs: Normal respiratory effort with conversation.  Abdomen: Non-distended per patient report Extremities: Pt denies edema. Neuro: Alert & oriented x 3.   Recent Labs: 12/14/2017: TSH 1.480 01/12/2018: ALT 20 04/05/2018: B Natriuretic Peptide 268.4 05/17/2018: Hemoglobin 10.0; Platelets 133 09/28/2018: BUN 23; Creatinine, Ser 1.95; Potassium 4.5; Sodium 141  Personally reviewed   Wt Readings from Last 3 Encounters:  09/28/18 62.6 kg (138 lb)  08/30/18 62 kg (136 lb 9.6 oz)  07/30/18 61.1 kg (134 lb 12.8 oz)      ASSESSMENT AND PLAN:   1. Chronic Systolic Heart Failure due to NICM. S/p Medtronic BiV ICD. Likely familial CM.  - Echo 12/14/17: EF 20-25%, trivial AI, mild to mod MR, mildly reduced RV, severely dilated RA and LA, PA pressures moderately to severely increased.  - CPX 12/27/17: Peak VO2 13.5, slope 42 - CPX 05/16/18: pVO2 12.1, slope 48 - LHC 02/02/18 with normal coronaries. RHC showed severe NICM with well compensated hemodynamics. - Repeat RHC 05/17/18: NICM with elevated filling pressures and relatively preserved output, suggestive of significant MR - Chronic NYHA III symptoms. Says she is unchanged from previous.  - Blood type AB-pos . She has been  seen by Dr. Mosetta Pigeon at [facility] and felt not to be candidate for heart-kidney transplant but might be candidate for high-risk VAD. [facility] declined seeing her for heart-kidney transplant due to age.  - Volume status stable by weight and her report. Optivol stable as of 11/12/18 - Continue lasix 40 mg daily. She takes an extra 20 mg PRN afor weight 135 or greter. - Continue coreg 3.125 mg BID - Continue entresto 49/51 mg BID - Hold off on spiro with borderline creatinine and soft BP. - Will arrange home RN visits to check labs - We discussed VAD again but says she is stable and wants to hold off for now.   2. PAF - Remains in NSR on amio 100 daily. Regular on her hone monitor - Continue Eliquis 5 bid. Weight now at 60Kg. Will decrease Eliquis to 2.5 bid with Cr > 1.5  3. CKD, baseline creatinine ~1.7-1.9 - Creatinine 10/19 1.67 (GFR 31) ->  Creatinine 09/28/18 1.95 - Will need repeat labs. Will arrange home nurse draw  4. MR - Mild to moderate on most recent echo. No change.   6. Hx of VT s/p Medtronic BiV ICD - Continue amiodarone 100 mg daily. No change.    COVID screen The patient does not have any symptoms that suggest any further testing/ screening at this time.  Social distancing reinforced today.  Recommended follow-up:  As above  Relevant cardiac medications were reviewed at length with the patient today.   The patient does not have concerns regarding their medications at this time.   The following changes were made today:  As above  Today, I have spent 17 minutes with the patient with telehealth technology discussing the above issues .    Signed, Glori Bickers, MD  12/03/2018 1:25 PM  Advanced Heart Failure [place] [address] [facility] [place] [address] [phone] (office) [phone] (fax)

## 2018-12-03 NOTE — Patient Instructions (Addendum)
DECREASE Eliquis to 2.5 mg (1 tab) twice a day. This has been sent to your pharmacy.  You have a follow up appointment scheduled for 20 July @11 :40.  We have contacted a home health nurse to draw labs for you.

## 2018-12-05 ENCOUNTER — Ambulatory Visit (HOSPITAL_COMMUNITY)
Admission: RE | Admit: 2018-12-05 | Discharge: 2018-12-05 | Disposition: A | Discharge status: Home / Self Care | Payer: Medicare Other | Source: Ambulatory Visit | Attending: Cardiology | Admitting: Cardiology

## 2018-12-05 ENCOUNTER — Other Ambulatory Visit: Payer: Self-pay

## 2018-12-05 DIAGNOSIS — I5022 Chronic systolic (congestive) heart failure: Secondary | ICD-10-CM | POA: Diagnosis not present

## 2018-12-05 LAB — BRAIN NATRIURETIC PEPTIDE: B Natriuretic Peptide: 246 pg/mL — ABNORMAL HIGH (ref 0.0–100.0)

## 2018-12-05 LAB — BASIC METABOLIC PANEL
Anion gap: 8 (ref 5–15)
BUN: 19 mg/dL (ref 8–23)
CO2: 27 mmol/L (ref 22–32)
Calcium: 10 mg/dL (ref 8.9–10.3)
Chloride: 105 mmol/L (ref 98–111)
Creatinine, Ser: 1.76 mg/dL — ABNORMAL HIGH (ref 0.44–1.00)
GFR calc Af Amer: 34 mL/min — ABNORMAL LOW (ref >60)
GFR calc non Af Amer: 29 mL/min — ABNORMAL LOW (ref >60)
Glucose, Bld: 72 mg/dL (ref 70–99)
Potassium: 4.2 mmol/L (ref 3.5–5.1)
Sodium: 140 mmol/L (ref 135–145)

## 2018-12-05 NOTE — Progress Notes (Signed)
Kristi Parker      DOB: Jan 22, 1951  Purpose of Visit: BNP, BMET HF provider: Bensimhon  Medications: Is the patient taking all medications listed on MAR from Epic? Yes List any medications that are not being taken correctly: n/a  List any medication refills needed: n/a  Is the patient able to pick up medications? Yes  Vitals: BP:  114/66  HR: 64  Oxygen: 98% RA Weight: 133lbs       Physical Exam: Peripheral edema: no  Wounds: no  Location:  Any patient concerns? States her Eliquis was decreased to 2.5mg  and was ordered for her w/o her consent.  States she will take 1/2 of the 5mg  tabs and return the 2.5mg  tablets that were sent to her so she won't have to pay the $80 they cost.  ReDS Vest/Clip Reading: n/a  Rhythm Strip: n/a  Is Home Health recommended? No If yes, state reason:   Labs drawn: BNP, BMET and brought back to the HF clinic and given to Kristi Parker, Therapist, sports.  Kristi Ingles, RN 12/05/18

## 2018-12-06 ENCOUNTER — Other Ambulatory Visit: Payer: Self-pay

## 2018-12-06 ENCOUNTER — Ambulatory Visit (INDEPENDENT_AMBULATORY_CARE_PROVIDER_SITE_OTHER): Payer: Medicare Other | Admitting: *Deleted

## 2018-12-06 DIAGNOSIS — I428 Other cardiomyopathies: Secondary | ICD-10-CM

## 2018-12-06 LAB — CUP PACEART REMOTE DEVICE CHECK
Battery Remaining Longevity: 47 mo
Battery Voltage: 2.97 V
Brady Statistic AP VP Percent: 98.78 %
Brady Statistic AP VS Percent: 0.94 %
Brady Statistic AS VP Percent: 0.26 %
Brady Statistic AS VS Percent: 0.01 %
Brady Statistic RA Percent Paced: 99.67 %
Brady Statistic RV Percent Paced: 97.48 %
Date Time Interrogation Session: 20200423084223
HighPow Impedance: 38 Ohm
Implantable Lead Implant Date: 20091104
Implantable Lead Implant Date: 20091104
Implantable Lead Implant Date: 20171214
Implantable Lead Location: 753858
Implantable Lead Location: 753859
Implantable Lead Location: 753860
Implantable Lead Model: 4196
Implantable Lead Model: 5076
Implantable Pulse Generator Implant Date: 20171214
Lead Channel Impedance Value: 342 Ohm
Lead Channel Impedance Value: 456 Ohm
Lead Channel Impedance Value: 456 Ohm
Lead Channel Impedance Value: 475 Ohm
Lead Channel Impedance Value: 646 Ohm
Lead Channel Impedance Value: 988 Ohm
Lead Channel Pacing Threshold Amplitude: 0.375 V
Lead Channel Pacing Threshold Amplitude: 0.625 V
Lead Channel Pacing Threshold Amplitude: 1.75 V
Lead Channel Pacing Threshold Pulse Width: 0.4 ms
Lead Channel Pacing Threshold Pulse Width: 0.4 ms
Lead Channel Pacing Threshold Pulse Width: 0.6 ms
Lead Channel Sensing Intrinsic Amplitude: 0.125 mV
Lead Channel Sensing Intrinsic Amplitude: 7.25 mV
Lead Channel Setting Pacing Amplitude: 2 V
Lead Channel Setting Pacing Amplitude: 2.5 V
Lead Channel Setting Pacing Amplitude: 3.5 V
Lead Channel Setting Pacing Pulse Width: 0.4 ms
Lead Channel Setting Pacing Pulse Width: 0.6 ms
Lead Channel Setting Sensing Sensitivity: 0.3 mV

## 2018-12-07 ENCOUNTER — Telehealth (INDEPENDENT_AMBULATORY_CARE_PROVIDER_SITE_OTHER): Payer: Medicare Other | Admitting: Cardiology

## 2018-12-07 ENCOUNTER — Other Ambulatory Visit: Payer: Self-pay

## 2018-12-07 ENCOUNTER — Encounter: Payer: Self-pay | Admitting: Cardiology

## 2018-12-07 VITALS — BP 100/67 | HR 98 | Ht 63.0 in | Wt 134.4 lb

## 2018-12-07 DIAGNOSIS — Z95810 Presence of automatic (implantable) cardiac defibrillator: Secondary | ICD-10-CM

## 2018-12-07 DIAGNOSIS — I5022 Chronic systolic (congestive) heart failure: Secondary | ICD-10-CM

## 2018-12-07 DIAGNOSIS — N184 Chronic kidney disease, stage 4 (severe): Secondary | ICD-10-CM

## 2018-12-07 DIAGNOSIS — I480 Paroxysmal atrial fibrillation: Secondary | ICD-10-CM

## 2018-12-07 DIAGNOSIS — I428 Other cardiomyopathies: Secondary | ICD-10-CM

## 2018-12-07 NOTE — Patient Instructions (Signed)
Medication Instructions:  The current medical regimen is effective;  continue present plan and medications.  If you need a refill on your cardiac medications before your next appointment, please call your pharmacy.   Follow-Up: Follow up in 6 months with Dr. Skains.  You will receive a letter in the mail 2 months before you are due.  Please call us when you receive this letter to schedule your follow up appointment.   Thank you for choosing [facility]!!       

## 2018-12-07 NOTE — Progress Notes (Signed)
Virtual Visit via Video Note   This visit type was conducted due to national recommendations for restrictions regarding the COVID-19 Pandemic (e.g. social distancing) in an effort to limit this patient's exposure and mitigate transmission in our community.  Due to her co-morbid illnesses, this patient is at least at moderate risk for complications without adequate follow up.  This format is felt to be most appropriate for this patient at this time.  All issues noted in this document were discussed and addressed.  A limited physical exam was performed with this format.  Please refer to the patient's chart for her consent to telehealth for [facility].   Evaluation Performed:  Follow-up visit  Date:  12/07/2018   ID:  Kristi, Parker 03/09/1951, MRN [id number]  Patient Location: Home Provider Location: Home  PCP:  [name], MD  Cardiologist:  Candee Furbish, MD  Electrophysiologist:  None   Chief Complaint: Chronic heart failure follow-up  History of Present Illness:    Kristi Parker is a 68 y.o. female with nonischemic cardiomyopathy EF 20% with Medtronic biventricular ICD, history of VT, history of stroke in 2004, paroxysmal atrial fibrillation and chronic kidney disease stage III/IV.  Multiple family members have had cardiomyopathy in several underwent transplant.  She had an evaluation at [facility] for a heart kidney transplant but she does not felt to be a good candidate but might be a candidate for high risk bed.  She was turned down as well at [facility].  Her VO2 13.5, quite low in October 2019.  Cardiac index 2.7.  In view of Dr. Clayborne Dana note from 12/03/2018 she was still able to do the yard work slowly, trying to do exercises at home including the whole loop.  Using her scale.  No recent atrial fibrillation or VT.  100% pacing.  Overall she is remaining stable she states.  No recent increase in shortness of breath.  NYHA class III symptoms.  Trying to exercise as much as  possible.  Eager to get back to the gym which is closed.  No chest pain no syncope no bleeding.  The patient does not have symptoms concerning for COVID-19 infection (fever, chills, cough, or new shortness of breath).    Past Medical History:  Diagnosis Date  . Adenomatous colon polyp   . Cervical radiculopathy at C5   . Chronic systolic heart failure ([place])   . CKD (chronic kidney disease), stage III ([place])   . DDD (degenerative disc disease), cervical   . DDD (degenerative disc disease), lumbar   . Fibroid   . GERD (gastroesophageal reflux disease)   . Hearing loss 02/2010   L hearing loss/vertigo, steroids  . History of seizure disorder    Related Hx  . Mitral regurgitation    moderate  . Multiple sclerosis ([place]) 1984  . Nonischemic cardiomyopathy (HCC)    Moderate LVEF 35-40% by ECHO 2011, 25-30% by echo 2013  . Permanent atrial fibrillation   . Pulmonary hypertension, secondary 06/04/2013   As a result of nonischemic cardiomyopathy EF 25-30%  . Stroke [facility])    Right brain CVA, complete recovery 07/2003  . Ventricular tachycardia ([place])   . Vertigo 02/2010   L hearing loss/vertigo, steroids   Past Surgical History:  Procedure Laterality Date  . BIV PACEMAKER GENERATOR CHANGE OUT N/A 09/18/2014   Procedure: BIV PACEMAKER GENERATOR CHANGE OUT;  Surgeon: Thompson Grayer, MD;  Location: [facility] CATH LAB;  Service: Cardiovascular;  Laterality: N/A;  . DILATION AND CURETTAGE  OF UTERUS    . EP IMPLANTABLE DEVICE N/A 07/28/2016   Procedure: ICD Implant;  Surgeon: Evans Lance, MD;  Location: [place] CV LAB;  Service: Cardiovascular;  Laterality: N/A;  . HYSTEROSCOPY    . PACEMAKER INSERTION     PTVP 08/2001 for complete heart block. Upgrade PTVP to MDT BiV 06/2008 by Dr Leonia Reeves  . RIGHT HEART CATH N/A 05/17/2018   Procedure: RIGHT HEART CATH;  Surgeon: Jolaine Artist, MD;  Location: [place] CV LAB;  Service: Cardiovascular;  Laterality: N/A;  . RIGHT/LEFT HEART CATH AND  CORONARY ANGIOGRAPHY N/A 02/02/2018   Procedure: RIGHT/LEFT HEART CATH AND CORONARY ANGIOGRAPHY;  Surgeon: Jolaine Artist, MD;  Location: [place] CV LAB;  Service: Cardiovascular;  Laterality: N/A;  . TUBAL LIGATION       Current Meds  Medication Sig  . acetaminophen (TYLENOL) 500 MG tablet Take 500-1,000 mg by mouth every 6 (six) hours as needed (for back pain).  . ALPHAGAN P 0.1 % SOLN Place 1 drop into both eyes 2 (two) times daily.   [name] amiodarone (PACERONE) 200 MG tablet Take 0.5 tablets (100 mg total) by mouth daily.  [name] apixaban (ELIQUIS) 2.5 MG TABS tablet Take 1 tablet (2.5 mg total) by mouth 2 (two) times daily.  . Biotin 2500 MCG CAPS Take 2,500 mcg by mouth at bedtime.  . Calcium Carbonate-Vitamin D (CALCIUM + D PO) Take 1 tablet by mouth every evening.   . carvedilol (COREG) 3.125 MG tablet TAKE 1 TABLET BY MOUTH TWICE A DAY  . fexofenadine (ALLEGRA) 180 MG tablet Take 180 mg by mouth daily as needed for allergies.   . furosemide (LASIX) 40 MG tablet TAKE 1 TABLET BY MOUTH  DAILY. MAY TAKE EXTRA 1/2  TABLET AS NEEDED FOR  SWELLING.  [name] levothyroxine (SYNTHROID, LEVOTHROID) 50 MCG tablet Take 50 mcg by mouth daily before breakfast.   . loratadine (CLARITIN) 10 MG tablet Take 10 mg by mouth daily as needed for allergies.  . Magnesium 500 MG TABS Take 500 mg by mouth at bedtime.  . NON FORMULARY Apply 1 application topically 2 (two) times daily. [facility]  Onychomycosis Nail Lacquer -  Fluconazole 2%, Terbinafine 1% DMSO Apply to affected nail twice daily Qty. 120 gm 3 refills  . potassium chloride (K-DUR) 10 MEQ tablet TAKE TWO (2) TABLETS BY MOUTH DAILY  . sacubitril-valsartan (ENTRESTO) 49-51 MG Take 1 tablet by mouth 2 (two) times daily.     Allergies:   Patient has no known allergies.   Social History   Tobacco Use  . Smoking status: Never Smoker  . Smokeless tobacco: Never Used  Substance Use Topics  . Alcohol use: Yes    Comment: rare  . Drug use:  No     Family Hx: The patient's family history includes CAD in her unknown relative; Cancer in her father; Diabetes in her mother; Multiple sclerosis in her mother.  ROS:   Please see the history of present illness.    No fevers chills nausea vomiting syncope bleeding All other systems reviewed and are negative.   Prior CV studies:   The following studies were reviewed today:  Prior echocardiogram and right heart cath numbers reviewed  Labs/Other Tests and Data Reviewed:    EKG:  An ECG dated 05/17/18 was personally reviewed today and demonstrated:  Biventricular pacing heart rate 60  Recent Labs: 12/14/2017: TSH 1.480 01/12/2018: ALT 20 05/17/2018: Hemoglobin 10.0; Platelets 133 12/05/2018: B Natriuretic Peptide 246.0; BUN  19; Creatinine, Ser 1.76; Potassium 4.2; Sodium 140   Recent Lipid Panel Lab Results  Component Value Date/Time   CHOL 181 02/20/2017 09:55 AM   TRIG 68 02/20/2017 09:55 AM   HDL 66 02/20/2017 09:55 AM   CHOLHDL 2.7 02/20/2017 09:55 AM   CHOLHDL 4 07/18/2013 11:00 AM   LDLCALC 101 (H) 02/20/2017 09:55 AM    Wt Readings from Last 3 Encounters:  12/07/18 134 lb 6.4 oz (61 kg)  12/05/18 133 lb (60.3 kg)  09/28/18 138 lb (62.6 kg)     Objective:    Vital Signs:  BP 100/67   Pulse 98   Ht 5\' 3"  (1.6 m)   Wt 134 lb 6.4 oz (61 kg)   LMP  (LMP Unknown)   BMI 23.81 kg/m    VITAL SIGNS:  reviewed GEN:  no acute distress EYES:  sclerae anicteric, EOMI - Extraocular Movements Intact RESPIRATORY:  normal respiratory effort, symmetric expansion SKIN:  no rash, lesions or ulcers. MUSCULOSKELETAL:  no obvious deformities. NEURO:  alert and oriented x 3, no obvious focal deficit PSYCH:  normal affect  ASSESSMENT & PLAN:    Chronic systolic heart failure, nonischemic cardiomyopathy, EF 20% - Overall stable, NYHA class III symptoms.  Trying to do as much exercise as she can.  Understands weight-based Lasix.  Continuing with both Entresto as well as  carvedilol.  Fully appreciate Dr. Clayborne Dana help and guidance with her situation.  Interesting family genetics regarding her cardiomyopathy.  Paroxysmal atrial fibrillation - Low-dose amiodarone.  No changes.  Eliquis decreased to 2.5 mg based upon weight and creatinine.  Chronic kidney disease stage III - Last creatinine in the 1.7 range.  Previously 1.9.  Potassium is stable on Entresto.  Medtronic biventricular ICD - Doing well no changes.  Fluid status stable.  COVID-19 Education: The signs and symptoms of COVID-19 were discussed with the patient and how to seek care for testing (follow up with PCP or arrange E-visit).  The importance of social distancing was discussed today.  Time:   Today, I have spent 15 minutes with the patient with telehealth technology discussing the above problems.     Medication Adjustments/Labs and Tests Ordered: Current medicines are reviewed at length with the patient today.  Concerns regarding medicines are outlined above.   Tests Ordered: No orders of the defined types were placed in this encounter.   Medication Changes: No orders of the defined types were placed in this encounter.   Disposition:  Follow up in 6 month(s)  Signed, Candee Furbish, MD  12/07/2018 3:25 PM    [place]

## 2018-12-11 ENCOUNTER — Telehealth (HOSPITAL_COMMUNITY): Payer: Self-pay

## 2018-12-11 NOTE — Telephone Encounter (Signed)
Reviewed labs with pt. Pt verbalized understanding. No further questions.

## 2018-12-14 ENCOUNTER — Encounter: Payer: Self-pay | Admitting: Cardiology

## 2018-12-14 NOTE — Progress Notes (Signed)
Remote ICD transmission.   

## 2018-12-17 ENCOUNTER — Other Ambulatory Visit: Payer: Self-pay

## 2018-12-17 ENCOUNTER — Ambulatory Visit (INDEPENDENT_AMBULATORY_CARE_PROVIDER_SITE_OTHER): Payer: Medicare Other

## 2018-12-17 DIAGNOSIS — I5022 Chronic systolic (congestive) heart failure: Secondary | ICD-10-CM | POA: Diagnosis not present

## 2018-12-17 DIAGNOSIS — Z95810 Presence of automatic (implantable) cardiac defibrillator: Secondary | ICD-10-CM

## 2018-12-19 NOTE — Progress Notes (Signed)
EPIC Encounter for ICM Monitoring  Patient Name: Kristi Parker is a 69 y.o. female Date: 12/19/2018 Primary Care Physican: [name], MD Primary Cardiologist:[name] Electrophysiologist: Allred Bi-V Pacing: 98% Last Weight:133lbs (baseline131-133lbs)       Transmission reviewed and results sent to patient by mychart message.   Thoracic impedance normal.   Prescribed:Furosemide40 mg-Take 1 tablet by mouth daily. May take an extra 20 mg PRN afor weight 135 or greter.  Labs: 12/05/2018 Creatinine 1.76, BUN 19, Potassium 4.2, Sodium 140, GFR 29-34 09/28/2018 Creatinine 1.95, BUN 23, Potassium 4.5, Sodium 141, GFR 26-30 05/23/2018 Creatinine 1.67, BUN 25, Potassium 4.0, Sodium 141, GFR31-36 05/17/2018 Creatinine 1.66, BUN 22, Potassium 4.2, Sodium 141, GFR31-36 04/05/2018 Creatinine1.85, BUN23, Potassium4.1, Sodium140, POL41-03 03/21/2018 Creatinine1.70, BUN17, Potassium4.2, Sodium141, GFR30-35  02/02/2018 Creatinine1.86, BUN19, Potassium4.0, Sodium140, [id number]  01/12/2018 Creatinine2.04, BUN18, Potassium4.2, Sodium141, [id number]  12/14/2017 Creatinine1.59, BUN14, Potassium4.0, [id number], [id number] A complete set of results can be found in Results Review.  Recommendations: None.  Follow-up plan: ICM clinic phone appointment on6/03/2019.  Copy of ICM check sent to Dr.Allred.   3 month ICM trend: 12/17/2018    1 Year ICM trend:       [name], RN 12/19/2018 9:30 AM

## 2019-01-03 ENCOUNTER — Other Ambulatory Visit (HOSPITAL_COMMUNITY): Payer: Self-pay | Admitting: Internal Medicine

## 2019-01-21 ENCOUNTER — Ambulatory Visit (INDEPENDENT_AMBULATORY_CARE_PROVIDER_SITE_OTHER): Payer: Medicare Other

## 2019-01-21 DIAGNOSIS — I5022 Chronic systolic (congestive) heart failure: Secondary | ICD-10-CM | POA: Diagnosis not present

## 2019-01-21 DIAGNOSIS — Z95810 Presence of automatic (implantable) cardiac defibrillator: Secondary | ICD-10-CM

## 2019-01-25 ENCOUNTER — Other Ambulatory Visit: Payer: Self-pay | Admitting: Cardiology

## 2019-01-25 NOTE — Progress Notes (Signed)
EPIC Encounter for ICM Monitoring  Patient Name: Kristi Parker is a 68 y.o. female Date: 01/25/2019 Primary Care Physican: [name], MD Primary Cardiologist:[name] Electrophysiologist: Allred Bi-V Pacing: 96.2% Last Weight:133lbs (baseline131-133lbs)       Transmission reviewed and results sent to patient by mychart message.   Thoracic impedance normal.   Prescribed:Furosemide40 mg-Take 1 tablet by mouth daily. May take an extra 20 mg PRN afor weight 135 or greter.  Labs: 12/05/2018 Creatinine 1.76, BUN 19, Potassium 4.2, Sodium 140, GFR 29-34 09/28/2018 Creatinine 1.95, BUN 23, Potassium 4.5, Sodium 141, GFR 26-30 05/23/2018 Creatinine 1.67, BUN 25, Potassium 4.0, Sodium 141, GFR31-36 05/17/2018 Creatinine 1.66, BUN 22, Potassium 4.2, Sodium 141, GFR31-36 04/05/2018 Creatinine1.85, BUN23, Potassium4.1, Sodium140, [id number] 03/21/2018 Creatinine1.70, BUN17, Potassium4.2, Sodium141, GFR30-35  02/02/2018 Creatinine1.86, BUN19, Potassium4.0, Sodium140, [id number]  01/12/2018 Creatinine2.04, BUN18, Potassium4.2, Sodium141, [id number]  12/14/2017 Creatinine1.59, BUN14, Potassium4.0, POLIDC301, [id number] A complete set of results can be found in Results Review.  Recommendations:None.  Follow-up plan: ICM clinic phone appointment on7/13/2020.  Copy of ICM check sent to Dr.Allred.   3 month ICM trend: 01/21/2019    1 Year ICM trend:       [name], RN 01/25/2019 1:17 PM

## 2019-02-03 ENCOUNTER — Other Ambulatory Visit (HOSPITAL_COMMUNITY): Payer: Self-pay | Admitting: Internal Medicine

## 2019-02-08 ENCOUNTER — Other Ambulatory Visit: Payer: Self-pay | Admitting: Cardiology

## 2019-02-14 ENCOUNTER — Other Ambulatory Visit (HOSPITAL_COMMUNITY): Payer: Self-pay

## 2019-02-14 MED ORDER — ENTRESTO 49-51 MG PO TABS: 1.0000 | ORAL_TABLET | Freq: Two times a day (BID) | ORAL | 3 refills | Status: DC

## 2019-02-18 ENCOUNTER — Other Ambulatory Visit: Payer: Self-pay | Admitting: *Deleted

## 2019-02-18 MED ORDER — CARVEDILOL 3.125 MG PO TABS: 3.1250 mg | ORAL_TABLET | Freq: Two times a day (BID) | ORAL | 2 refills | Status: DC

## 2019-02-27 ENCOUNTER — Other Ambulatory Visit: Payer: Self-pay | Admitting: Internal Medicine

## 2019-02-27 DIAGNOSIS — Z20822 Contact with and (suspected) exposure to covid-19: Secondary | ICD-10-CM

## 2019-03-04 ENCOUNTER — Ambulatory Visit (HOSPITAL_COMMUNITY)
Admission: RE | Admit: 2019-03-04 | Discharge: 2019-03-04 | Disposition: A | Discharge status: Home / Self Care | Payer: Medicare Other | Source: Ambulatory Visit | Attending: Internal Medicine | Admitting: Internal Medicine

## 2019-03-04 ENCOUNTER — Encounter (HOSPITAL_COMMUNITY): Payer: Self-pay | Admitting: Internal Medicine

## 2019-03-04 ENCOUNTER — Other Ambulatory Visit: Payer: Self-pay

## 2019-03-04 VITALS — BP 91/58 | HR 78 | Wt 138.6 lb

## 2019-03-04 DIAGNOSIS — Z8673 Personal history of transient ischemic attack (TIA), and cerebral infarction without residual deficits: Secondary | ICD-10-CM | POA: Insufficient documentation

## 2019-03-04 DIAGNOSIS — Z79899 Other long term (current) drug therapy: Secondary | ICD-10-CM | POA: Insufficient documentation

## 2019-03-04 DIAGNOSIS — I428 Other cardiomyopathies: Secondary | ICD-10-CM | POA: Insufficient documentation

## 2019-03-04 DIAGNOSIS — Z950 Presence of cardiac pacemaker: Secondary | ICD-10-CM | POA: Insufficient documentation

## 2019-03-04 DIAGNOSIS — Z7901 Long term (current) use of anticoagulants: Secondary | ICD-10-CM | POA: Insufficient documentation

## 2019-03-04 DIAGNOSIS — N184 Chronic kidney disease, stage 4 (severe): Secondary | ICD-10-CM | POA: Insufficient documentation

## 2019-03-04 DIAGNOSIS — I272 Pulmonary hypertension, unspecified: Secondary | ICD-10-CM | POA: Insufficient documentation

## 2019-03-04 DIAGNOSIS — I5022 Chronic systolic (congestive) heart failure: Secondary | ICD-10-CM | POA: Insufficient documentation

## 2019-03-04 DIAGNOSIS — Z95810 Presence of automatic (implantable) cardiac defibrillator: Secondary | ICD-10-CM

## 2019-03-04 DIAGNOSIS — I480 Paroxysmal atrial fibrillation: Secondary | ICD-10-CM | POA: Diagnosis not present

## 2019-03-04 DIAGNOSIS — M5136 Other intervertebral disc degeneration, lumbar region: Secondary | ICD-10-CM | POA: Insufficient documentation

## 2019-03-04 DIAGNOSIS — G35 Multiple sclerosis: Secondary | ICD-10-CM | POA: Insufficient documentation

## 2019-03-04 DIAGNOSIS — I4821 Permanent atrial fibrillation: Secondary | ICD-10-CM | POA: Insufficient documentation

## 2019-03-04 LAB — BASIC METABOLIC PANEL
Anion gap: 8 (ref 5–15)
BUN: 24 mg/dL — ABNORMAL HIGH (ref 8–23)
CO2: 25 mmol/L (ref 22–32)
Calcium: 9.4 mg/dL (ref 8.9–10.3)
Chloride: 104 mmol/L (ref 98–111)
Creatinine, Ser: 2.08 mg/dL — ABNORMAL HIGH (ref 0.44–1.00)
GFR calc Af Amer: 28 mL/min — ABNORMAL LOW (ref >60)
GFR calc non Af Amer: 24 mL/min — ABNORMAL LOW (ref >60)
Glucose, Bld: 121 mg/dL — ABNORMAL HIGH (ref 70–99)
Potassium: 4.3 mmol/L (ref 3.5–5.1)
Sodium: 137 mmol/L (ref 135–145)

## 2019-03-04 LAB — NOVEL CORONAVIRUS, NAA: SARS-CoV-2, NAA: NOT DETECTED

## 2019-03-04 LAB — BRAIN NATRIURETIC PEPTIDE: B Natriuretic Peptide: 184.2 pg/mL — ABNORMAL HIGH (ref 0.0–100.0)

## 2019-03-04 MED ORDER — SPIRONOLACTONE 25 MG PO TABS: 12.5000 mg | ORAL_TABLET | Freq: Every day | ORAL | 6 refills | Status: DC

## 2019-03-04 NOTE — Progress Notes (Signed)
Advance HF Clinic Note   Date:  03/04/2019   ID:  Kristi, Parker 1950/09/23, MRN [id number]  Location: Home  Provider location:  Advanced [facility] Failure Clinic Type of Visit: Established patient  PCP:  [name], MD  Cardiologist:  Candee Furbish, MD Primary HF: Kristi Parker  Chief Complaint: Heart Failure follow-up   History of Present Illness:  Kristi Parker is a 68 y.o. female with history of VT, severe systolic HF due to NICM (EF 20-25%) s/p Medtronic BiV ICD, CVA 2004, PAF, and CKD III-IV.   She was first diagnosed with HF in 2013. She is unsure of the cause. She had a heart cath and did not have any blockages. We also follow her son in HF clinic for NICM. She states that multiple family members have severe HF and several have underwent transplant.  Based on CPX testing, we referred her to Dr. Mosetta Pigeon at [facility]. She was seen there on 02/22/18 and they discussed need for advanced therapies including possible heart or heart-kidney transplant. She had f/u with them subsequently and felt not to be candidate for heart-kidney transplant but might be candidate for high-risk VAD. We reviewed case with [facility] and she was also turned down for heart/kidney transplant due to age.  Had repeat CPX in 10/19 with low Vo2 (13.5) and high slope (42). Repeat RHC done on 10//193 as below as part of transplant work up at [facility] showed elevated volume with CI 2.7  She returns for routine f/u. Overall doing about the same. [facility]. Tries to do some exercises at home and uses her Hula-hoop. Can do all ADLs without problem. Weight up about 1-2 pounds. SBP 110-120. No dizziness or lightheadedness. No ICD firing.   Medtronic ICD interrogation from 11/12/18: No recent AF/AT. No VT/VF. BiV pacing ~100%. Fluid looks very good.   SHAWNIE NICOLE denies symptoms worrisome for COVID 19.   Past Medical History:  Diagnosis Date  . Adenomatous colon polyp   .  Cervical radiculopathy at C5   . Chronic systolic heart failure ([place])   . CKD (chronic kidney disease), stage III ([place])   . DDD (degenerative disc disease), cervical   . DDD (degenerative disc disease), lumbar   . Fibroid   . GERD (gastroesophageal reflux disease)   . Hearing loss 02/2010   L hearing loss/vertigo, steroids  . History of seizure disorder    Related Hx  . Mitral regurgitation    moderate  . Multiple sclerosis ([place]) 1984  . Nonischemic cardiomyopathy (HCC)    Moderate LVEF 35-40% by ECHO 2011, 25-30% by echo 2013  . Permanent atrial fibrillation   . Pulmonary hypertension, secondary 06/04/2013   As a result of nonischemic cardiomyopathy EF 25-30%  . Stroke [facility])    Right brain CVA, complete recovery 07/2003  . Ventricular tachycardia ([place])   . Vertigo 02/2010   L hearing loss/vertigo, steroids   Past Surgical History:  Procedure Laterality Date  . BIV PACEMAKER GENERATOR CHANGE OUT N/A 09/18/2014   Procedure: BIV PACEMAKER GENERATOR CHANGE OUT;  Surgeon: Thompson Grayer, MD;  Location: [facility] CATH LAB;  Service: Cardiovascular;  Laterality: N/A;  . DILATION AND CURETTAGE OF UTERUS    . EP IMPLANTABLE DEVICE N/A 07/28/2016   Procedure: ICD Implant;  Surgeon: Evans Lance, MD;  Location: [place] CV LAB;  Service: Cardiovascular;  Laterality: N/A;  . HYSTEROSCOPY    . PACEMAKER INSERTION  PTVP 08/2001 for complete heart block. Upgrade PTVP to MDT BiV 06/2008 by Dr Leonia Reeves  . RIGHT HEART CATH N/A 05/17/2018   Procedure: RIGHT HEART CATH;  Surgeon: Jolaine Artist, MD;  Location: [place] CV LAB;  Service: Cardiovascular;  Laterality: N/A;  . RIGHT/LEFT HEART CATH AND CORONARY ANGIOGRAPHY N/A 02/02/2018   Procedure: RIGHT/LEFT HEART CATH AND CORONARY ANGIOGRAPHY;  Surgeon: Jolaine Artist, MD;  Location: [place] CV LAB;  Service: Cardiovascular;  Laterality: N/A;  . TUBAL LIGATION       Current Outpatient Medications  Medication Sig Dispense Refill  .  acetaminophen (TYLENOL) 500 MG tablet Take 500-1,000 mg by mouth every 6 (six) hours as needed (for back pain).    . ALPHAGAN P 0.1 % SOLN Place 1 drop into both eyes 2 (two) times daily.     [name] amiodarone (PACERONE) 200 MG tablet Take 0.5 tablets (100 mg total) by mouth daily. 135 tablet 2  . Biotin 2500 MCG CAPS Take 2,500 mcg by mouth at bedtime.    . Calcium Carbonate-Vitamin D (CALCIUM + D PO) Take 1 tablet by mouth every evening.     . carvedilol (COREG) 3.125 MG tablet Take 1 tablet (3.125 mg total) by mouth 2 (two) times daily. 180 tablet 2  . ELIQUIS 2.5 MG TABS tablet TAKE 1 TABLET BY MOUTH  TWICE DAILY 180 tablet 2  . fexofenadine (ALLEGRA) 180 MG tablet Take 180 mg by mouth daily as needed for allergies.     . furosemide (LASIX) 40 MG tablet TAKE 1 TABLET BY MOUTH  DAILY. MAY TAKE EXTRA 1/2  TABLET AS NEEDED FOR  SWELLING. 135 tablet 3  . levothyroxine (SYNTHROID, LEVOTHROID) 50 MCG tablet Take 50 mcg by mouth daily before breakfast.     . loratadine (CLARITIN) 10 MG tablet Take 10 mg by mouth daily as needed for allergies.    . Magnesium 500 MG TABS Take 500 mg by mouth at bedtime.    . potassium chloride (K-DUR) 10 MEQ tablet TAKE TWO (2) TABLETS BY MOUTH DAILY 180 tablet 2  . sacubitril-valsartan (ENTRESTO) 49-51 MG Take 1 tablet by mouth 2 (two) times daily. 60 tablet 3  . NON FORMULARY Apply 1 application topically 2 (two) times daily. [facility]  Onychomycosis Nail Lacquer -  Fluconazole 2%, Terbinafine 1% DMSO Apply to affected nail twice daily Qty. 120 gm 3 refills     No current facility-administered medications for this encounter.     Allergies:   Patient has no known allergies.   Social History:  The patient  reports that she has never smoked. She has never used smokeless tobacco. She reports current alcohol use. She reports that she does not use drugs.   Family History:  The patient's family history includes CAD in her unknown relative; Cancer in her father;  Diabetes in her mother; Multiple sclerosis in her mother.   ROS:  Please see the history of present illness.   All other systems are personally reviewed and negative.   Exam:  General:  Well appearing. No resp difficulty HEENT: normal Neck: supple. no JVD. Carotids 2+ bilat; no bruits. No lymphadenopathy or thryomegaly appreciated. Cor: PMI nondisplaced. Regular rate & rhythm. 2/6 MR Lungs: clear Abdomen: soft, nontender, nondistended. No hepatosplenomegaly. No bruits or masses. Good bowel sounds. Extremities: no cyanosis, clubbing, rash, edema Neuro: alert & orientedx3, cranial nerves grossly intact. moves all 4 extremities w/o difficulty. Affect pleasant   Recent Labs: 05/17/2018: Hemoglobin 10.0; Platelets 133 12/05/2018:  B Natriuretic Peptide 246.0; BUN 19; Creatinine, Ser 1.76; Potassium 4.2; Sodium 140  Personally reviewed   Wt Readings from Last 3 Encounters:  03/04/19 62.9 kg (138 lb 9.6 oz)  12/07/18 61 kg (134 lb 6.4 oz)  12/05/18 60.3 kg (133 lb)     ICD interrogated personally in clinic. No VT/AF/shocks. Optivol ok. Activity level < 1hr/today  ASSESSMENT AND PLAN:   1. Chronic Systolic Heart Failure due to NICM. S/p Medtronic BiV ICD. Likely familial CM.  - Echo 12/14/17: EF 20-25%, trivial AI, mild to mod MR, mildly reduced RV, severely dilated RA and LA, PA pressures moderately to severely increased.  - CPX 12/27/17: Peak VO2 13.5, slope 42 - CPX 05/16/18: pVO2 12.1, slope 48 - LHC 02/02/18 with normal coronaries. RHC showed severe NICM with well compensated hemodynamics. - Repeat RHC 05/17/18: NICM with elevated filling pressures and relatively preserved output, suggestive of significant MR - Remains with NYHA III symptoms. No change from previous - Blood type AB-pos . She has been seen by Dr. Mosetta Pigeon at [facility] and felt not to be candidate for heart-kidney transplant but might be candidate for high-risk VAD. [place] declined seeing her for heart-kidney transplant due to age.   - Volume status stable on exam and by Optivol - Continue lasix 40 mg daily. She takes an extra 20 mg PRN afor weight 135 or greter. - Continue coreg 3.125 mg BID - Continue entresto 49/51 mg BID - Stop potassium and start spiro 12.5. Check labs today and 1 week - We discussed VAD again but says she appears stable and wants to hold off for now. Although she reports NYHA III symptoms CPX and activity level on ICD suggest worse functional capacity and I am very worried about her. We will follow her closely and also repeat CPX in next few months. If getting worse may have one last chance to consider high-risk VAD.   2. PAF - Remains in NSR on amio 100 daily. No evidence AF on ICD - With Cr > 1.5 and weight recently < 60 kg we have decreased dose to 2.5 bid  3. CKD, baseline creatinine ~1.7-1.9 - Creatinine 10/19 1.67 (GFR 31)  - will recheck today  4. MR - Mild to moderate on most recent echo. No change.   6. Hx of VT s/p Medtronic BiV ICD - No VT or ICD shocks on ICD. Continue amiodarone 100 mg daily. No change.    Glori Bickers, MD  03/04/2019 12:09 PM  Advanced Heart Failure [place] [address] Heart and [place] [address] [phone] (office) [phone] (fax)

## 2019-03-04 NOTE — Patient Instructions (Signed)
Start Spironolactone 12.5 mg (1/2 tab) daily  STOP Potassium  Labs done today  Labs in 1 week  Your physician has requested that you have an echocardiogram. Echocardiography is a painless test that uses sound waves to create images of your heart. It provides your doctor with information about the size and shape of your heart and how well your heart's chambers and valves are working. This procedure takes approximately one hour. There are no restrictions for this procedure.  Your physician recommends that you schedule a follow-up appointment in: 3 months

## 2019-03-07 ENCOUNTER — Ambulatory Visit (INDEPENDENT_AMBULATORY_CARE_PROVIDER_SITE_OTHER): Payer: Medicare Other | Admitting: *Deleted

## 2019-03-07 DIAGNOSIS — I472 Ventricular tachycardia, unspecified: Secondary | ICD-10-CM

## 2019-03-07 DIAGNOSIS — I5022 Chronic systolic (congestive) heart failure: Secondary | ICD-10-CM

## 2019-03-07 LAB — CUP PACEART REMOTE DEVICE CHECK
Battery Remaining Longevity: 42 mo
Battery Voltage: 2.96 V
Brady Statistic AP VP Percent: 98.41 %
Brady Statistic AP VS Percent: 1.58 %
Brady Statistic AS VP Percent: 0.01 %
Brady Statistic AS VS Percent: 0 %
Brady Statistic RA Percent Paced: 99.95 %
Brady Statistic RV Percent Paced: 96.13 %
Date Time Interrogation Session: 20200723083724
HighPow Impedance: 37 Ohm
Implantable Lead Implant Date: 20091104
Implantable Lead Implant Date: 20091104
Implantable Lead Implant Date: 20171214
Implantable Lead Location: 753858
Implantable Lead Location: 753859
Implantable Lead Location: 753860
Implantable Lead Model: 4196
Implantable Lead Model: 5076
Implantable Pulse Generator Implant Date: 20171214
Lead Channel Impedance Value: 342 Ohm
Lead Channel Impedance Value: 361 Ohm
Lead Channel Impedance Value: 418 Ohm
Lead Channel Impedance Value: 456 Ohm
Lead Channel Impedance Value: 589 Ohm
Lead Channel Impedance Value: 931 Ohm
Lead Channel Pacing Threshold Amplitude: 0.5 V
Lead Channel Pacing Threshold Amplitude: 0.625 V
Lead Channel Pacing Threshold Amplitude: 1.875 V
Lead Channel Pacing Threshold Pulse Width: 0.4 ms
Lead Channel Pacing Threshold Pulse Width: 0.4 ms
Lead Channel Pacing Threshold Pulse Width: 0.6 ms
Lead Channel Sensing Intrinsic Amplitude: 0.125 mV
Lead Channel Sensing Intrinsic Amplitude: 7.75 mV
Lead Channel Setting Pacing Amplitude: 2 V
Lead Channel Setting Pacing Amplitude: 2.5 V
Lead Channel Setting Pacing Amplitude: 3.5 V
Lead Channel Setting Pacing Pulse Width: 0.4 ms
Lead Channel Setting Pacing Pulse Width: 0.6 ms
Lead Channel Setting Sensing Sensitivity: 0.3 mV

## 2019-03-08 ENCOUNTER — Ambulatory Visit (INDEPENDENT_AMBULATORY_CARE_PROVIDER_SITE_OTHER): Payer: Medicare Other

## 2019-03-08 DIAGNOSIS — Z95810 Presence of automatic (implantable) cardiac defibrillator: Secondary | ICD-10-CM | POA: Diagnosis not present

## 2019-03-08 DIAGNOSIS — I5022 Chronic systolic (congestive) heart failure: Secondary | ICD-10-CM | POA: Diagnosis not present

## 2019-03-08 NOTE — Progress Notes (Signed)
EPIC Encounter for ICM Monitoring  Patient Name: Kristi Parker is a 68 y.o. female Date: 03/08/2019 Primary Care Physican: [name], MD Primary Cardiologist:[name] Electrophysiologist: Allred Bi-V Pacing: 96.8% 03/04/2019 Office Weight:138lbs    Transmission reviewed.  Optivol thoracic impedance normal.   Prescribed:Furosemide40 mg-Take 1 tablet by mouth daily.May take anextra 0.5 tablet as needed for swelling or weight > 135 lbs.  Labs: 03/04/2019 Creatinine 2.08, BUN 24, Potassium 4.3, Sodium 137, GFR 24-28 12/05/2018 Creatinine 1.76, BUN 19, Potassium 4.2, Sodium 140, GFR 29-34 A complete set of results can be found in Results Review.  Recommendations:None.  Follow-up plan: ICM clinic phone appointment on8/24/2020.  Copy of ICM check sent to Dr.Allred.   3 month ICM trend: 03/08/2019    1 Year ICM trend:       [name], RN 03/08/2019 5:06 PM

## 2019-03-11 ENCOUNTER — Other Ambulatory Visit: Payer: Self-pay

## 2019-03-11 ENCOUNTER — Ambulatory Visit (HOSPITAL_COMMUNITY)
Admission: RE | Admit: 2019-03-11 | Discharge: 2019-03-11 | Disposition: A | Discharge status: Home / Self Care | Payer: Medicare Other | Source: Ambulatory Visit | Attending: Cardiology | Admitting: Cardiology

## 2019-03-11 DIAGNOSIS — I5022 Chronic systolic (congestive) heart failure: Secondary | ICD-10-CM | POA: Diagnosis present

## 2019-03-11 LAB — BASIC METABOLIC PANEL
Anion gap: 10 (ref 5–15)
BUN: 24 mg/dL — ABNORMAL HIGH (ref 8–23)
CO2: 24 mmol/L (ref 22–32)
Calcium: 9.7 mg/dL (ref 8.9–10.3)
Chloride: 105 mmol/L (ref 98–111)
Creatinine, Ser: 1.95 mg/dL — ABNORMAL HIGH (ref 0.44–1.00)
GFR calc Af Amer: 30 mL/min — ABNORMAL LOW (ref >60)
GFR calc non Af Amer: 26 mL/min — ABNORMAL LOW (ref >60)
Glucose, Bld: 99 mg/dL (ref 70–99)
Potassium: 4 mmol/L (ref 3.5–5.1)
Sodium: 139 mmol/L (ref 135–145)

## 2019-03-18 ENCOUNTER — Other Ambulatory Visit: Payer: Self-pay

## 2019-03-18 ENCOUNTER — Ambulatory Visit (HOSPITAL_COMMUNITY)
Admission: RE | Admit: 2019-03-18 | Discharge: 2019-03-18 | Disposition: A | Discharge status: Home / Self Care | Payer: Medicare Other | Source: Ambulatory Visit | Attending: Internal Medicine | Admitting: Internal Medicine

## 2019-03-18 DIAGNOSIS — I5022 Chronic systolic (congestive) heart failure: Secondary | ICD-10-CM | POA: Insufficient documentation

## 2019-03-18 DIAGNOSIS — I083 Combined rheumatic disorders of mitral, aortic and tricuspid valves: Secondary | ICD-10-CM | POA: Diagnosis not present

## 2019-03-18 DIAGNOSIS — I428 Other cardiomyopathies: Secondary | ICD-10-CM | POA: Diagnosis not present

## 2019-03-18 DIAGNOSIS — Z95810 Presence of automatic (implantable) cardiac defibrillator: Secondary | ICD-10-CM | POA: Diagnosis not present

## 2019-03-18 DIAGNOSIS — I272 Pulmonary hypertension, unspecified: Secondary | ICD-10-CM | POA: Diagnosis not present

## 2019-03-18 DIAGNOSIS — I4891 Unspecified atrial fibrillation: Secondary | ICD-10-CM | POA: Diagnosis not present

## 2019-03-18 NOTE — Progress Notes (Signed)
  Echocardiogram 2D Echocardiogram has been performed.  Kristi Parker 03/18/2019, 12:00 PM

## 2019-03-19 NOTE — Progress Notes (Signed)
Remote ICD transmission.   

## 2019-04-01 ENCOUNTER — Encounter (HOSPITAL_COMMUNITY): Payer: Self-pay | Admitting: *Deleted

## 2019-04-08 ENCOUNTER — Ambulatory Visit (INDEPENDENT_AMBULATORY_CARE_PROVIDER_SITE_OTHER): Payer: Medicare Other

## 2019-04-08 DIAGNOSIS — Z95810 Presence of automatic (implantable) cardiac defibrillator: Secondary | ICD-10-CM | POA: Diagnosis not present

## 2019-04-08 DIAGNOSIS — I5022 Chronic systolic (congestive) heart failure: Secondary | ICD-10-CM | POA: Diagnosis not present

## 2019-04-10 NOTE — Progress Notes (Signed)
EPIC Encounter for ICM Monitoring  Patient Name: Kristi Parker is a 68 y.o. female Date: 04/10/2019 Primary Care Physican: [name], MD Primary Cardiologist:[name] Electrophysiologist: Allred Bi-V Pacing: 95.8% 03/04/2019 Office Weight:138lbs    Attempted call to patient and unable to reach.   Transmission reviewed.   Optivol thoracic impedance normal.   Prescribed:Furosemide40 mg-Take 1 tablet by mouth daily.May take anextra 0.5 tablet as needed for swelling or weight > 135 lbs.  Labs: 03/11/2019 Creatinine 1.95, BUN 24, Potassium 4.0, Sodium 139, GFR 26-30 03/04/2019 Creatinine 2.08, BUN 24, Potassium 4.3, Sodium 137, GFR 24-28 12/05/2018 Creatinine 1.76, BUN 19, Potassium 4.2, Sodium 140, GFR 29-34 A complete set of results can be found in Results Review.  Recommendations: Unable to reach.    Follow-up plan: ICM clinic phone appointment on10/12/2018.  Copy of ICM check sent to Dr.Allred.    3 month ICM trend: 04/08/2019    1 Year ICM trend:       [name], RN 04/10/2019 11:15 AM

## 2019-05-06 ENCOUNTER — Other Ambulatory Visit (HOSPITAL_COMMUNITY): Payer: Self-pay | Admitting: Internal Medicine

## 2019-05-21 ENCOUNTER — Telehealth: Payer: Self-pay

## 2019-05-21 NOTE — Telephone Encounter (Signed)
Left message for patient to remind of missed remote transmission.  

## 2019-05-27 NOTE — Progress Notes (Signed)
No ICM remote transmission received for 05/20/2019 and next ICM transmission scheduled for 06/10/2019.

## 2019-06-05 ENCOUNTER — Other Ambulatory Visit: Payer: Self-pay

## 2019-06-05 ENCOUNTER — Ambulatory Visit (HOSPITAL_COMMUNITY)
Admission: RE | Admit: 2019-06-05 | Discharge: 2019-06-05 | Disposition: A | Discharge status: Home / Self Care | Payer: Medicare Other | Source: Ambulatory Visit | Attending: Internal Medicine | Admitting: Internal Medicine

## 2019-06-05 ENCOUNTER — Encounter (HOSPITAL_COMMUNITY): Payer: Self-pay | Admitting: Internal Medicine

## 2019-06-05 VITALS — BP 100/60 | HR 60 | Wt 139.4 lb

## 2019-06-05 DIAGNOSIS — I4821 Permanent atrial fibrillation: Secondary | ICD-10-CM | POA: Diagnosis not present

## 2019-06-05 DIAGNOSIS — N184 Chronic kidney disease, stage 4 (severe): Secondary | ICD-10-CM | POA: Insufficient documentation

## 2019-06-05 DIAGNOSIS — I480 Paroxysmal atrial fibrillation: Secondary | ICD-10-CM | POA: Diagnosis not present

## 2019-06-05 DIAGNOSIS — Z7901 Long term (current) use of anticoagulants: Secondary | ICD-10-CM | POA: Insufficient documentation

## 2019-06-05 DIAGNOSIS — I5022 Chronic systolic (congestive) heart failure: Secondary | ICD-10-CM | POA: Insufficient documentation

## 2019-06-05 DIAGNOSIS — Z950 Presence of cardiac pacemaker: Secondary | ICD-10-CM | POA: Diagnosis not present

## 2019-06-05 DIAGNOSIS — I272 Pulmonary hypertension, unspecified: Secondary | ICD-10-CM | POA: Insufficient documentation

## 2019-06-05 DIAGNOSIS — Z79899 Other long term (current) drug therapy: Secondary | ICD-10-CM | POA: Insufficient documentation

## 2019-06-05 DIAGNOSIS — G35 Multiple sclerosis: Secondary | ICD-10-CM | POA: Insufficient documentation

## 2019-06-05 DIAGNOSIS — Z8673 Personal history of transient ischemic attack (TIA), and cerebral infarction without residual deficits: Secondary | ICD-10-CM | POA: Insufficient documentation

## 2019-06-05 DIAGNOSIS — I428 Other cardiomyopathies: Secondary | ICD-10-CM | POA: Insufficient documentation

## 2019-06-05 LAB — BASIC METABOLIC PANEL
Anion gap: 8 (ref 5–15)
BUN: 27 mg/dL — ABNORMAL HIGH (ref 8–23)
CO2: 26 mmol/L (ref 22–32)
Calcium: 9.9 mg/dL (ref 8.9–10.3)
Chloride: 107 mmol/L (ref 98–111)
Creatinine, Ser: 2.03 mg/dL — ABNORMAL HIGH (ref 0.44–1.00)
GFR calc Af Amer: 29 mL/min — ABNORMAL LOW (ref >60)
GFR calc non Af Amer: 25 mL/min — ABNORMAL LOW (ref >60)
Glucose, Bld: 128 mg/dL — ABNORMAL HIGH (ref 70–99)
Potassium: 4 mmol/L (ref 3.5–5.1)
Sodium: 141 mmol/L (ref 135–145)

## 2019-06-05 LAB — BRAIN NATRIURETIC PEPTIDE: B Natriuretic Peptide: 191.8 pg/mL — ABNORMAL HIGH (ref 0.0–100.0)

## 2019-06-05 NOTE — Patient Instructions (Addendum)
Labs done today, we will call you with results  If kidney function is stable we will have you start a new medication: Farxiga 10 mg daily  Your physician recommends that you schedule a follow-up appointment in: 4 months  If you have any questions or concerns before your next appointment please send [place] a message through [place] or call our office at [phone].  At the [facility], you and your health needs are our priority. As part of our continuing mission to provide you with exceptional heart care, we have created designated Provider Care Teams. These Care Teams include your primary Cardiologist (physician) and Advanced Practice Providers (APPs- Physician Assistants and Nurse Practitioners) who all work together to provide you with the care you need, when you need it.   You may see any of the following providers on your designated Care Team at your next follow up: [name] Dr Glori Bickers . Dr Loralie Champagne . Darrick Grinder, NP . Lyda Jester, PA   Please be sure to bring in all your medications bottles to every appointment.

## 2019-06-05 NOTE — Addendum Note (Signed)
Encounter addended by: Jolaine Artist, MD on: 06/05/2019 10:01 PM  Actions taken: Charge Capture section accepted

## 2019-06-05 NOTE — Progress Notes (Signed)
Advance HF Clinic Note   Date:  06/05/2019   ID:  KAJAH SANTIZO, DOB October 30, 1950, MRN [id number]  Location: Home  Provider location: [place] [facility] Type of Visit: Established patient  PCP:  [name], MD  Cardiologist:  Candee Furbish, MD Primary HF: Guenevere Roorda  Chief Complaint: Heart Failure follow-up   History of Present Illness:  Kristi Parker is a 68 y.o. female with history of VT, severe systolic HF due to NICM (EF 20-25%) s/p Medtronic BiV ICD, CVA 2004, PAF, and CKD III-IV.   She was first diagnosed with HF in 2013. She is unsure of the cause. She had a heart cath and did not have any blockages. We also follow her son in HF clinic for NICM. She states that multiple family members have severe HF and several have underwent transplant.  Based on CPX testing, we referred her to Dr. Mosetta Pigeon at [facility]. She was seen there on 02/22/18 and they discussed need for advanced therapies including possible heart or heart-kidney transplant. She had f/u with them subsequently and felt not to be candidate for heart-kidney transplant but might be candidate for high-risk VAD. We reviewed case with [facility] and she was also turned down for heart/kidney transplant due to age.  Had repeat CPX in 10/19 with low Vo2 (13.5) and high slope (42). Repeat RHC done on 10//193 as below as part of transplant work up at [facility] showed elevated volume with CI 2.7  She returns for routine f/u. Overall doing about the same. [facility] but now [facility]. Says she feels fine. Can do all ADLs without problem. Can go to the store but has to take her time. Doing YouTube aerobic senior videos 2-3x/week 20-81mins. No CP or undue SOb. No problems with meds. No orthopnea or PND.   Echo 8/20 EF 25% RV normal Personally reviewed  ReDS 40%   Medtronic ICD interrogation today. Optivol flat No AF/VT. Activity ~1hr Personally reviewed   SUMAYAH BEARSE denies symptoms worrisome  for COVID 19.   Past Medical History:  Diagnosis Date  . Adenomatous colon polyp   . Cervical radiculopathy at C5   . Chronic systolic heart failure ([place])   . CKD (chronic kidney disease), stage III   . DDD (degenerative disc disease), cervical   . DDD (degenerative disc disease), lumbar   . Fibroid   . GERD (gastroesophageal reflux disease)   . Hearing loss 02/2010   L hearing loss/vertigo, steroids  . History of seizure disorder    Related Hx  . Mitral regurgitation    moderate  . Multiple sclerosis ([place]) 1984  . Nonischemic cardiomyopathy (HCC)    Moderate LVEF 35-40% by ECHO 2011, 25-30% by echo 2013  . Permanent atrial fibrillation ([place])   . Pulmonary hypertension, secondary 06/04/2013   As a result of nonischemic cardiomyopathy EF 25-30%  . Stroke [facility])    Right brain CVA, complete recovery 07/2003  . Ventricular tachycardia ([place])   . Vertigo 02/2010   L hearing loss/vertigo, steroids   Past Surgical History:  Procedure Laterality Date  . BIV PACEMAKER GENERATOR CHANGE OUT N/A 09/18/2014   Procedure: BIV PACEMAKER GENERATOR CHANGE OUT;  Surgeon: Thompson Grayer, MD;  Location: [facility] CATH LAB;  Service: Cardiovascular;  Laterality: N/A;  . DILATION AND CURETTAGE OF UTERUS    . EP IMPLANTABLE DEVICE N/A 07/28/2016   Procedure: ICD Implant;  Surgeon: Evans Lance, MD;  Location: [place] CV  LAB;  Service: Cardiovascular;  Laterality: N/A;  . HYSTEROSCOPY    . PACEMAKER INSERTION     PTVP 08/2001 for complete heart block. Upgrade PTVP to MDT BiV 06/2008 by Dr Leonia Reeves  . RIGHT HEART CATH N/A 05/17/2018   Procedure: RIGHT HEART CATH;  Surgeon: Jolaine Artist, MD;  Location: [place] CV LAB;  Service: Cardiovascular;  Laterality: N/A;  . RIGHT/LEFT HEART CATH AND CORONARY ANGIOGRAPHY N/A 02/02/2018   Procedure: RIGHT/LEFT HEART CATH AND CORONARY ANGIOGRAPHY;  Surgeon: Jolaine Artist, MD;  Location: [place] CV LAB;  Service: Cardiovascular;  Laterality: N/A;  .  TUBAL LIGATION       Current Outpatient Medications  Medication Sig Dispense Refill  . acetaminophen (TYLENOL) 500 MG tablet Take 500-1,000 mg by mouth every 6 (six) hours as needed (for back pain).    . ALPHAGAN P 0.1 % SOLN Place 1 drop into both eyes 2 (two) times daily.     [name] amiodarone (PACERONE) 200 MG tablet Take 0.5 tablets (100 mg total) by mouth daily. 135 tablet 2  . Biotin 2500 MCG CAPS Take 2,500 mcg by mouth at bedtime.    . Calcium Carbonate-Vitamin D (CALCIUM + D PO) Take 1 tablet by mouth every evening.     . carvedilol (COREG) 3.125 MG tablet Take 1 tablet (3.125 mg total) by mouth 2 (two) times daily. 180 tablet 2  . ELIQUIS 2.5 MG TABS tablet TAKE 1 TABLET BY MOUTH  TWICE DAILY 180 tablet 2  . ENTRESTO 49-51 MG TAKE 1 TABLET BY MOUTH TWICE A DAY 60 tablet 3  . fexofenadine (ALLEGRA) 180 MG tablet Take 180 mg by mouth daily as needed for allergies.     . furosemide (LASIX) 40 MG tablet TAKE 1 TABLET BY MOUTH  DAILY. MAY TAKE EXTRA 1/2  TABLET AS NEEDED FOR  SWELLING. 135 tablet 3  . levothyroxine (SYNTHROID, LEVOTHROID) 50 MCG tablet Take 50 mcg by mouth daily before breakfast.     . loratadine (CLARITIN) 10 MG tablet Take 10 mg by mouth daily as needed for allergies.    . Magnesium 500 MG TABS Take 500 mg by mouth at bedtime.    [name] spironolactone (ALDACTONE) 25 MG tablet Take 0.5 tablets (12.5 mg total) by mouth daily. 15 tablet 6   No current facility-administered medications for this encounter.     Allergies:   Patient has no known allergies.   Social History:  The patient  reports that she has never smoked. She has never used smokeless tobacco. She reports current alcohol use. She reports that she does not use drugs.   Family History:  The patient's family history includes CAD in an other family member; Cancer in her father; Diabetes in her mother; Multiple sclerosis in her mother.   ROS:  Please see the history of present illness.   All other systems are  personally reviewed and negative.   Vitals:   06/05/19 1051  BP: 100/60  Pulse: 60  SpO2: 100%  Weight: 63.2 kg (139 lb 6.4 oz)     Exam:  General:  Well appearing. No resp difficulty HEENT: normal Neck: supple. no JVD. Carotids 2+ bilat; no bruits. No lymphadenopathy or thryomegaly appreciated. Cor: PMI nondisplaced. Regular rate & rhythm. No rubs, gallops or murmurs. Lungs: clear Abdomen: soft, nontender, nondistended. No hepatosplenomegaly. No bruits or masses. Good bowel sounds. Extremities: no cyanosis, clubbing, rash, edema Neuro: alert & orientedx3, cranial nerves grossly intact. moves all 4 extremities w/o difficulty. Affect  pleasant  Recent Labs: 03/04/2019: B Natriuretic Peptide 184.2 03/11/2019: BUN 24; Creatinine, Ser 1.95; Potassium 4.0; Sodium 139  Personally reviewed   Wt Readings from Last 3 Encounters:  06/05/19 63.2 kg (139 lb 6.4 oz)  03/04/19 62.9 kg (138 lb 9.6 oz)  12/07/18 61 kg (134 lb 6.4 oz)      ASSESSMENT AND PLAN:   1. Chronic Systolic Heart Failure due to NICM. S/p Medtronic BiV ICD. Likely familial CM.  - Echo 12/14/17: EF 20-25%, trivial AI, mild to mod MR, mildly reduced RV, severely dilated RA and LA, PA pressures moderately to severely increased.  - Echo 8/20 EF 25% RV normal Personally reviewed - CPX 12/27/17: Peak VO2 13.5, slope 42 - CPX 05/16/18: pVO2 12.1, slope 48 - LHC 02/02/18 with normal coronaries. RHC showed severe NICM with well compensated hemodynamics. - Repeat RHC 05/17/18: NICM with elevated filling pressures and relatively preserved output, suggestive of significant MR - Remains stable with NYHA III symptoms. No change from previous - Volume status ok  - Blood type AB-pos . She has been seen by Dr. Mosetta Pigeon at [facility] and felt not to be candidate for heart-kidney transplant but might be candidate for high-risk VAD. [place] declined seeing her for heart-kidney transplant due to age.  - Continue lasix 40 mg daily. She takes an extra 20  mg PRN for weight 135 or greter. - Continue coreg 3.125 mg BID - Continue entresto 49/51 mg BID - Continue spiro 12.5.  - Will not titrate with low BP.  - Check labs. If creatinine < 2.0 will start Farxiga - I have encouraged her to be more active and try to get at least 3-4k steps per day on her watch.  - We discussed VAD again today but says she appears stable and wants to hold off for now. Although she reports NYHA III symptoms CPX and activity level on ICD suggest worse functional capacity. If getting worse may have one last chance to consider high-risk VAD.   2. PAF - Remains in NSR on amio 100 daily. No evidence AF on ICD - With Cr > 1.5 and weight recently < 60 kg continue Eliquis at 2.5 bid  3. CKD, baseline creatinine ~1.7-1.9 - Creatinine 10/19 1.67 (GFR 31)  - will recheck today  4. MR - Mild to moderate on most recent echo. No change.   6. Hx of VT s/p Medtronic BiV ICD - ICD interrogated personally today. No VT/AF Continue amiodarone 100 mg daily. No change.    Glori Bickers, MD  06/05/2019 11:13 AM  Advanced Heart Failure [place] [address] Heart [facility] Vascular [place] [address] [phone] (office) [phone] (fax)

## 2019-06-05 NOTE — Progress Notes (Signed)
ReDS Vest / Clip - 06/05/19 1100      ReDS Vest / Clip   Station Marker  A    Ruler Value  28    ReDS Value  Moderate volume overload    Anatomical Comments  sitting

## 2019-06-07 ENCOUNTER — Ambulatory Visit (INDEPENDENT_AMBULATORY_CARE_PROVIDER_SITE_OTHER): Payer: Medicare Other | Admitting: *Deleted

## 2019-06-07 DIAGNOSIS — I428 Other cardiomyopathies: Secondary | ICD-10-CM

## 2019-06-07 DIAGNOSIS — I5022 Chronic systolic (congestive) heart failure: Secondary | ICD-10-CM | POA: Diagnosis not present

## 2019-06-09 ENCOUNTER — Other Ambulatory Visit (HOSPITAL_COMMUNITY): Payer: Self-pay | Admitting: Internal Medicine

## 2019-06-09 LAB — CUP PACEART REMOTE DEVICE CHECK
Battery Remaining Longevity: 37 mo
Battery Voltage: 2.96 V
Brady Statistic AP VP Percent: 97.93 %
Brady Statistic AP VS Percent: 2.06 %
Brady Statistic AS VP Percent: 0.01 %
Brady Statistic AS VS Percent: 0 %
Brady Statistic RA Percent Paced: 99.94 %
Brady Statistic RV Percent Paced: 94.37 %
Date Time Interrogation Session: 20201023041702
HighPow Impedance: 37 Ohm
Implantable Lead Implant Date: 20091104
Implantable Lead Implant Date: 20091104
Implantable Lead Implant Date: 20171214
Implantable Lead Location: 753858
Implantable Lead Location: 753859
Implantable Lead Location: 753860
Implantable Lead Model: 4196
Implantable Lead Model: 5076
Implantable Pulse Generator Implant Date: 20171214
Lead Channel Impedance Value: 1121 Ohm
Lead Channel Impedance Value: 399 Ohm
Lead Channel Impedance Value: 418 Ohm
Lead Channel Impedance Value: 475 Ohm
Lead Channel Impedance Value: 513 Ohm
Lead Channel Impedance Value: 703 Ohm
Lead Channel Pacing Threshold Amplitude: 0.375 V
Lead Channel Pacing Threshold Amplitude: 0.625 V
Lead Channel Pacing Threshold Amplitude: 2.125 V
Lead Channel Pacing Threshold Pulse Width: 0.4 ms
Lead Channel Pacing Threshold Pulse Width: 0.4 ms
Lead Channel Pacing Threshold Pulse Width: 0.6 ms
Lead Channel Sensing Intrinsic Amplitude: 0.125 mV
Lead Channel Sensing Intrinsic Amplitude: 0.125 mV
Lead Channel Sensing Intrinsic Amplitude: 7 mV
Lead Channel Sensing Intrinsic Amplitude: 7 mV
Lead Channel Setting Pacing Amplitude: 2 V
Lead Channel Setting Pacing Amplitude: 2.5 V
Lead Channel Setting Pacing Amplitude: 3.75 V
Lead Channel Setting Pacing Pulse Width: 0.4 ms
Lead Channel Setting Pacing Pulse Width: 0.6 ms
Lead Channel Setting Sensing Sensitivity: 0.3 mV

## 2019-06-10 ENCOUNTER — Ambulatory Visit (INDEPENDENT_AMBULATORY_CARE_PROVIDER_SITE_OTHER): Payer: Medicare Other

## 2019-06-10 DIAGNOSIS — Z95810 Presence of automatic (implantable) cardiac defibrillator: Secondary | ICD-10-CM | POA: Diagnosis not present

## 2019-06-10 DIAGNOSIS — I5022 Chronic systolic (congestive) heart failure: Secondary | ICD-10-CM | POA: Diagnosis not present

## 2019-06-11 ENCOUNTER — Encounter (HOSPITAL_COMMUNITY): Payer: Self-pay

## 2019-06-12 NOTE — Progress Notes (Signed)
EPIC Encounter for ICM Monitoring  Patient Name: Kristi Parker is a 68 y.o. female Date: 06/12/2019 Primary Care Physican: [name], MD Primary Cardiologist:[name] Electrophysiologist: Allred Bi-V Pacing: 94.4% 06/05/2019 OfficeWeight:139.6lbs    Transmission reviewed.   Optivol thoracic impedance normal.   Prescribed:Furosemide40 mg-Take 1 tablet by mouth daily.May take anextra0.5 tablet as needed for swelling or weight > 135 lbs.  Labs: 06/05/2019 Creatinine 2.05, BUN 27, Potassium 4.0, Sodium 141, GFR 25-29 03/11/2019 Creatinine 1.95, BUN 24, Potassium 4.0, Sodium 139, GFR 26-30 03/04/2019 Creatinine 2.08, BUN 24, Potassium 4.3, Sodium 137, GFR 24-28 12/05/2018 Creatinine 1.76, BUN 19, Potassium 4.2, Sodium 140, GFR 29-34 A complete set of results can be found in Results Review.  Recommendations: None.  Follow-up plan: ICM clinic phone appointment on 07/15/2019.   91 day device clinic remote transmission 09/05/2018.    Copy of ICM check sent to Dr. Rayann Heman.   3 month ICM trend: 06/07/2019    1 Year ICM trend:       [name], RN 06/12/2019 1:19 PM

## 2019-06-17 NOTE — Progress Notes (Signed)
Remote ICD transmission.   

## 2019-06-21 ENCOUNTER — Other Ambulatory Visit: Payer: Self-pay

## 2019-07-15 ENCOUNTER — Ambulatory Visit (INDEPENDENT_AMBULATORY_CARE_PROVIDER_SITE_OTHER): Payer: Medicare Other

## 2019-07-15 ENCOUNTER — Telehealth: Payer: Self-pay

## 2019-07-15 DIAGNOSIS — I5022 Chronic systolic (congestive) heart failure: Secondary | ICD-10-CM

## 2019-07-15 DIAGNOSIS — Z95810 Presence of automatic (implantable) cardiac defibrillator: Secondary | ICD-10-CM | POA: Diagnosis not present

## 2019-07-15 NOTE — Progress Notes (Signed)
EPIC Encounter for ICM Monitoring  Patient Name: Kristi Parker is a 68 y.o. female Date: 07/15/2019 Primary Care Physican: [name], MD Primary Cardiologist:[name] Electrophysiologist: Allred Bi-V Pacing: 91.9% 06/05/2019 OfficeWeight:139.6lbs    Attempted call to patient and unable to reach.  Transmission reviewed.   Optivol thoracic impedance normal.   Prescribed:Furosemide40 mg-Take 1 tablet by mouth daily.May take anextra0.5 tablet as needed for swelling or weight > 135 lbs.  Labs: 06/05/2019 Creatinine 2.05, BUN 27, Potassium 4.0, Sodium 141, GFR 25-29 03/11/2019 Creatinine 1.95, BUN 24, Potassium 4.0, Sodium 139, GFR 26-30 03/04/2019 Creatinine 2.08, BUN 24, Potassium 4.3, Sodium 137, GFR 24-28 12/05/2018 Creatinine 1.76, BUN 19, Potassium 4.2, Sodium 140, GFR 29-34 A complete set of results can be found in Results Review.  Recommendations: Unable to reach.    Follow-up plan: ICM clinic phone appointment on 07/29/2019 to recheck fluid levels.   91 day device clinic remote transmission 09/06/2019.    Copy of ICM check sent to Dr. Rayann Heman and Dr Haroldine Laws.   3 month ICM trend: 07/15/2019    1 Year ICM trend:       [name], RN 07/15/2019 3:25 PM

## 2019-07-15 NOTE — Telephone Encounter (Signed)
Remote ICM transmission received.  Attempted call to patient regarding ICM remote transmission and no answer.  

## 2019-07-16 NOTE — Progress Notes (Signed)
Call to patient and transmission reviewed.  She reports she did not take Furosemide for 3 days due to she was traveling to her father's funeral. He had [place] and was 68 years old in nursing home.  She doubled her Furosemide today and tomorrow.  Will recheck fluid levels on 12/4.  She said she is feeling fine.

## 2019-07-16 NOTE — Progress Notes (Signed)
Received: Today Message Contents  Bensimhon, Shaune Pascal, MD  Satomi Buda Panda, RN        Fluid going up slowly. Can you have her take an extra dose of lasix please?   Thanks

## 2019-07-19 ENCOUNTER — Ambulatory Visit (INDEPENDENT_AMBULATORY_CARE_PROVIDER_SITE_OTHER): Payer: Medicare Other

## 2019-07-19 DIAGNOSIS — Z95810 Presence of automatic (implantable) cardiac defibrillator: Secondary | ICD-10-CM

## 2019-07-19 DIAGNOSIS — I5022 Chronic systolic (congestive) heart failure: Secondary | ICD-10-CM

## 2019-07-19 NOTE — Progress Notes (Signed)
EPIC Encounter for ICM Monitoring  Patient Name: Kristi Parker is a 68 y.o. female Date: 07/19/2019 Primary Care Physican: [name], MD Primary Cardiologist:[name] Electrophysiologist: Allred Bi-V Pacing: 91.7% LastWeight:139.6lbs    Attempted call to patient and unable to reach.  Transmission reviewed.   Optivol thoracic impedance returned to normal after taking extra Furosemide..   Prescribed:Furosemide40 mg-Take 1 tablet by mouth daily.May take anextra0.5 tablet as needed for swelling or weight > 135 lbs.  Labs: 06/05/2019 Creatinine 2.05, BUN 27, Potassium 4.0, Sodium 141, GFR 25-29 03/11/2019 Creatinine 1.95, BUN 24, Potassium 4.0, Sodium 139, GFR 26-30 03/04/2019 Creatinine 2.08, BUN 24, Potassium 4.3, Sodium 137, GFR 24-28 12/05/2018 Creatinine 1.76, BUN 19, Potassium 4.2, Sodium 140, GFR 29-34 A complete set of results can be found in Results Review.  Recommendations: No changes and encouraged to call if experiencing any fluid symptoms.  Follow-up plan: ICM clinic phone appointment on 08/19/2019.   91 day device clinic remote transmission 09/06/2019.    Copy of ICM check sent to Dr. Rayann Heman.   3 month ICM trend: 07/19/2019    1 Year ICM trend:       [name], RN 07/19/2019 4:12 PM

## 2019-08-19 ENCOUNTER — Ambulatory Visit (INDEPENDENT_AMBULATORY_CARE_PROVIDER_SITE_OTHER): Payer: Medicare PPO

## 2019-08-19 DIAGNOSIS — I5022 Chronic systolic (congestive) heart failure: Secondary | ICD-10-CM | POA: Diagnosis not present

## 2019-08-19 DIAGNOSIS — Z95810 Presence of automatic (implantable) cardiac defibrillator: Secondary | ICD-10-CM

## 2019-08-23 NOTE — Progress Notes (Signed)
EPIC Encounter for ICM Monitoring  Patient Name: Kristi Parker is a 69 y.o. female Date: 08/23/2019 Primary Care Physican: [name], MD Primary Cardiologist:[name] Electrophysiologist: Allred Bi-V Pacing: 91.4% LastWeight:139.6lbs    Transmission reviewed.  Optivol thoracic impedance normal.   Prescribed:Furosemide40 mg-Take 1 tablet by mouth daily.May take anextra0.5 tablet as needed for swelling or weight > 135 lbs.  Labs: 06/05/2019 Creatinine 2.05, BUN 27, Potassium 4.0, Sodium 141, GFR 25-29 03/11/2019 Creatinine 1.95, BUN 24, Potassium 4.0, Sodium 139, GFR 26-30 03/04/2019 Creatinine 2.08, BUN 24, Potassium 4.3, Sodium 137, GFR 24-28 12/05/2018 Creatinine 1.76, BUN 19, Potassium 4.2, Sodium 140, GFR 29-34 A complete set of results can be found in Results Review.  Recommendations:  None  Follow-up plan: ICM clinic phone appointment on 09/23/2019.   91 day device clinic remote transmission 09/06/2019.   Office appt 10/09/2019 with Dr. Haroldine Laws.    Copy of ICM check sent to Dr. Rayann Heman.   3 month ICM trend: 08/23/2019    1 Year ICM trend:       [name], RN 08/23/2019 3:47 PM

## 2019-09-06 ENCOUNTER — Encounter (HOSPITAL_COMMUNITY): Payer: Self-pay

## 2019-09-06 ENCOUNTER — Ambulatory Visit (INDEPENDENT_AMBULATORY_CARE_PROVIDER_SITE_OTHER): Payer: Medicare PPO | Admitting: *Deleted

## 2019-09-06 DIAGNOSIS — I5022 Chronic systolic (congestive) heart failure: Secondary | ICD-10-CM | POA: Diagnosis not present

## 2019-09-06 LAB — CUP PACEART REMOTE DEVICE CHECK
Battery Remaining Longevity: 32 mo
Battery Voltage: 2.95 V
Brady Statistic AP VP Percent: 97.31 %
Brady Statistic AP VS Percent: 2.68 %
Brady Statistic AS VP Percent: 0 %
Brady Statistic AS VS Percent: 0.01 %
Brady Statistic RA Percent Paced: 99.93 %
Brady Statistic RV Percent Paced: 91.79 %
Date Time Interrogation Session: 20210122043726
HighPow Impedance: 46 Ohm
Implantable Lead Implant Date: 20091104
Implantable Lead Implant Date: 20091104
Implantable Lead Implant Date: 20171214
Implantable Lead Location: 753858
Implantable Lead Location: 753859
Implantable Lead Location: 753860
Implantable Lead Model: 4196
Implantable Lead Model: 5076
Implantable Pulse Generator Implant Date: 20171214
Lead Channel Impedance Value: 1083 Ohm
Lead Channel Impedance Value: 361 Ohm
Lead Channel Impedance Value: 399 Ohm
Lead Channel Impedance Value: 475 Ohm
Lead Channel Impedance Value: 513 Ohm
Lead Channel Impedance Value: 703 Ohm
Lead Channel Pacing Threshold Amplitude: 0.5 V
Lead Channel Pacing Threshold Amplitude: 0.625 V
Lead Channel Pacing Threshold Amplitude: 2.25 V
Lead Channel Pacing Threshold Pulse Width: 0.4 ms
Lead Channel Pacing Threshold Pulse Width: 0.4 ms
Lead Channel Pacing Threshold Pulse Width: 0.6 ms
Lead Channel Sensing Intrinsic Amplitude: 0.125 mV
Lead Channel Sensing Intrinsic Amplitude: 0.125 mV
Lead Channel Sensing Intrinsic Amplitude: 6.625 mV
Lead Channel Sensing Intrinsic Amplitude: 6.625 mV
Lead Channel Setting Pacing Amplitude: 2 V
Lead Channel Setting Pacing Amplitude: 2.5 V
Lead Channel Setting Pacing Amplitude: 4 V
Lead Channel Setting Pacing Pulse Width: 0.4 ms
Lead Channel Setting Pacing Pulse Width: 0.6 ms
Lead Channel Setting Sensing Sensitivity: 0.3 mV

## 2019-09-09 ENCOUNTER — Other Ambulatory Visit (HOSPITAL_COMMUNITY): Payer: Self-pay | Admitting: Internal Medicine

## 2019-09-10 ENCOUNTER — Other Ambulatory Visit (HOSPITAL_COMMUNITY): Payer: Self-pay | Admitting: Internal Medicine

## 2019-09-23 ENCOUNTER — Ambulatory Visit (INDEPENDENT_AMBULATORY_CARE_PROVIDER_SITE_OTHER): Payer: Medicare PPO

## 2019-09-23 DIAGNOSIS — Z95810 Presence of automatic (implantable) cardiac defibrillator: Secondary | ICD-10-CM | POA: Diagnosis not present

## 2019-09-23 DIAGNOSIS — I5022 Chronic systolic (congestive) heart failure: Secondary | ICD-10-CM

## 2019-09-25 NOTE — Progress Notes (Signed)
EPIC Encounter for ICM Monitoring  Patient Name: Kristi STURKEY is a 69 y.o. female Date: 09/25/2019 Primary Care Physican: [name], MD Primary Cardiologist:[name] Electrophysiologist: Allred Bi-V Pacing: 90.6% LastWeight:139.6lbs    Transmission reviewed.Spoke with patient and reports feeling well at this time.  Denies fluid symptoms.    Optivol thoracic impedance normal.   Prescribed:Furosemide40 mg-Take 1 tablet by mouth daily.May take anextra0.5 tablet as needed for swelling or weight > 135 lbs.  Labs: 06/05/2019 Creatinine 2.05, BUN 27, Potassium 4.0, Sodium 141, GFR 25-29 03/11/2019 Creatinine 1.95, BUN 24, Potassium 4.0, Sodium 139, GFR 26-30 03/04/2019 Creatinine 2.08, BUN 24, Potassium 4.3, Sodium 137, GFR 24-28 12/05/2018 Creatinine 1.76, BUN 19, Potassium 4.2, Sodium 140, GFR 29-34 A complete set of results can be found in Results Review.  Recommendations:No changes and encouraged to call if experiencing any fluid symptoms.  Follow-up plan: ICM clinic phone appointment on3/15/2021. 91 day device clinic remote transmission 12/06/2019.   Office appt 10/09/2019 with Dr. Haroldine Laws.    Copy of ICM check sent to Dr. Rayann Heman.   3 month ICM trend: 09/23/2019    1 Year ICM trend:       [name], RN 09/25/2019 2:33 PM

## 2019-10-09 ENCOUNTER — Encounter (HOSPITAL_COMMUNITY): Payer: Self-pay | Admitting: Internal Medicine

## 2019-10-09 ENCOUNTER — Ambulatory Visit (HOSPITAL_COMMUNITY)
Admission: RE | Admit: 2019-10-09 | Discharge: 2019-10-09 | Disposition: A | Discharge status: Home / Self Care | Payer: Medicare PPO | Source: Ambulatory Visit | Attending: Internal Medicine | Admitting: Internal Medicine

## 2019-10-09 ENCOUNTER — Other Ambulatory Visit: Payer: Self-pay

## 2019-10-09 VITALS — BP 102/64 | HR 87 | Wt 132.4 lb

## 2019-10-09 DIAGNOSIS — Z79899 Other long term (current) drug therapy: Secondary | ICD-10-CM | POA: Insufficient documentation

## 2019-10-09 DIAGNOSIS — I472 Ventricular tachycardia, unspecified: Secondary | ICD-10-CM

## 2019-10-09 DIAGNOSIS — I480 Paroxysmal atrial fibrillation: Secondary | ICD-10-CM | POA: Diagnosis not present

## 2019-10-09 DIAGNOSIS — I5022 Chronic systolic (congestive) heart failure: Secondary | ICD-10-CM | POA: Diagnosis present

## 2019-10-09 DIAGNOSIS — N184 Chronic kidney disease, stage 4 (severe): Secondary | ICD-10-CM | POA: Insufficient documentation

## 2019-10-09 DIAGNOSIS — Z8249 Family history of ischemic heart disease and other diseases of the circulatory system: Secondary | ICD-10-CM | POA: Diagnosis not present

## 2019-10-09 DIAGNOSIS — Z7901 Long term (current) use of anticoagulants: Secondary | ICD-10-CM | POA: Diagnosis not present

## 2019-10-09 DIAGNOSIS — Z95810 Presence of automatic (implantable) cardiac defibrillator: Secondary | ICD-10-CM

## 2019-10-09 DIAGNOSIS — Z79890 Hormone replacement therapy (postmenopausal): Secondary | ICD-10-CM | POA: Insufficient documentation

## 2019-10-09 DIAGNOSIS — Z8673 Personal history of transient ischemic attack (TIA), and cerebral infarction without residual deficits: Secondary | ICD-10-CM | POA: Diagnosis not present

## 2019-10-09 DIAGNOSIS — I428 Other cardiomyopathies: Secondary | ICD-10-CM | POA: Insufficient documentation

## 2019-10-09 DIAGNOSIS — Z950 Presence of cardiac pacemaker: Secondary | ICD-10-CM | POA: Insufficient documentation

## 2019-10-09 DIAGNOSIS — G35 Multiple sclerosis: Secondary | ICD-10-CM | POA: Diagnosis not present

## 2019-10-09 LAB — BASIC METABOLIC PANEL
Anion gap: 12 (ref 5–15)
BUN: 13 mg/dL (ref 8–23)
CO2: 25 mmol/L (ref 22–32)
Calcium: 10 mg/dL (ref 8.9–10.3)
Chloride: 106 mmol/L (ref 98–111)
Creatinine, Ser: 1.82 mg/dL — ABNORMAL HIGH (ref 0.44–1.00)
GFR calc Af Amer: 33 mL/min — ABNORMAL LOW (ref >60)
GFR calc non Af Amer: 28 mL/min — ABNORMAL LOW (ref >60)
Glucose, Bld: 93 mg/dL (ref 70–99)
Potassium: 4 mmol/L (ref 3.5–5.1)
Sodium: 143 mmol/L (ref 135–145)

## 2019-10-09 LAB — BRAIN NATRIURETIC PEPTIDE: B Natriuretic Peptide: 169.9 pg/mL — ABNORMAL HIGH (ref 0.0–100.0)

## 2019-10-09 NOTE — Progress Notes (Signed)
Advance HF Clinic Note   Date:  10/09/2019   ID:  NEKISHA MCDIARMID, DOB 12/12/1950, MRN [id number]  Location: Home  Provider location: [facility] Type of Visit: Established patient  PCP:  [name], MD  Cardiologist:  Candee Furbish, MD Primary HF: [place]  Chief Complaint: Heart Failure follow-up   History of Present Illness:  Kristi Parker is a 69 y.o. female with history of VT, severe systolic HF due to NICM (EF 20-25%) s/p Medtronic BiV ICD, CVA 2004, PAF, and CKD III-IV.   She was first diagnosed with HF in 2013. She is unsure of the cause. She had a heart cath and did not have any blockages. We also follow her son in HF clinic for NICM. She states that multiple family members have severe HF and several have underwent transplant.  Based on CPX testing, we referred her to Dr. Mosetta Pigeon at [facility]. She was seen there on 02/22/18 and they discussed need for advanced therapies including possible heart or heart-kidney transplant. She had f/u with them subsequently and felt not to be candidate for heart-kidney transplant but might be candidate for high-risk VAD. We reviewed case with [facility] and she was also turned down for heart/kidney transplant due to age.  Had repeat CPX in 10/19 with low Vo2 (13.5) and high slope (42). Repeat RHC done on 10//193 as below as part of transplant work up at [facility] showed elevated volume with CI 2.7  She returns for routine f/u. Says she feels good in the "green zone". [facility]. No edema, orthopnea or PND. SBP 90-100. No dizziness when she stands up.   Echo 8/20 EF 25% RV normal Personally reviewed   Medtronic ICD interrogation today. Optivol climbing slowly. No AF/VT. Activity ~1hr/day Personally reviewed   Past Medical History:  Diagnosis Date  . Adenomatous colon polyp   . Cervical radiculopathy at C5   . Chronic systolic heart failure ([place])   . CKD (chronic kidney  disease), stage III   . DDD (degenerative disc disease), cervical   . DDD (degenerative disc disease), lumbar   . Fibroid   . GERD (gastroesophageal reflux disease)   . Hearing loss 02/2010   L hearing loss/vertigo, steroids  . History of seizure disorder    Related Hx  . Mitral regurgitation    moderate  . Multiple sclerosis ([place]) 1984  . Nonischemic cardiomyopathy ([facility])    Moderate LVEF 35-40% by ECHO 2011, 25-30% by echo 2013  . Permanent atrial fibrillation ([place])   . Pulmonary hypertension, secondary 06/04/2013   As a result of nonischemic cardiomyopathy EF 25-30%  . Stroke [facility])    Right brain CVA, complete recovery 07/2003  . Ventricular tachycardia ([place])   . Vertigo 02/2010   L hearing loss/vertigo, steroids   Past Surgical History:  Procedure Laterality Date  . BIV PACEMAKER GENERATOR CHANGE OUT N/A 09/18/2014   Procedure: BIV PACEMAKER GENERATOR CHANGE OUT;  Surgeon: Thompson Grayer, MD;  Location: [facility] CATH LAB;  Service: Cardiovascular;  Laterality: N/A;  . DILATION AND CURETTAGE OF UTERUS    . EP IMPLANTABLE DEVICE N/A 07/28/2016   Procedure: ICD Implant;  Surgeon: Evans Lance, MD;  Location: [place] CV LAB;  Service: Cardiovascular;  Laterality: N/A;  . HYSTEROSCOPY    . PACEMAKER INSERTION     PTVP 08/2001 for complete heart block. Upgrade PTVP to MDT BiV 06/2008 by Dr Leonia Reeves  . RIGHT  HEART CATH N/A 05/17/2018   Procedure: RIGHT HEART CATH;  Surgeon: Jolaine Artist, MD;  Location: [place] CV LAB;  Service: Cardiovascular;  Laterality: N/A;  . RIGHT/LEFT HEART CATH AND CORONARY ANGIOGRAPHY N/A 02/02/2018   Procedure: RIGHT/LEFT HEART CATH AND CORONARY ANGIOGRAPHY;  Surgeon: Jolaine Artist, MD;  Location: [place] CV LAB;  Service: Cardiovascular;  Laterality: N/A;  . TUBAL LIGATION       Current Outpatient Medications  Medication Sig Dispense Refill  . acetaminophen (TYLENOL) 500 MG tablet Take 500-1,000 mg by mouth every 6 (six) hours as needed  (for back pain).    . ALPHAGAN P 0.1 % SOLN Place 1 drop into both eyes 2 (two) times daily.     [name] amiodarone (PACERONE) 200 MG tablet Take 0.5 tablets (100 mg total) by mouth daily. 135 tablet 2  . Biotin 2500 MCG CAPS Take 2,500 mcg by mouth at bedtime.    . Calcium Carbonate-Vitamin D (CALCIUM + D PO) Take 1 tablet by mouth every evening.     . carvedilol (COREG) 3.125 MG tablet Take 1 tablet (3.125 mg total) by mouth 2 (two) times daily. 180 tablet 2  . ELIQUIS 2.5 MG TABS tablet TAKE 1 TABLET BY MOUTH  TWICE DAILY 180 tablet 2  . ENTRESTO 49-51 MG TAKE 1 TABLET BY MOUTH TWICE A DAY 60 tablet 3  . fexofenadine (ALLEGRA) 180 MG tablet Take 180 mg by mouth daily as needed for allergies.     . furosemide (LASIX) 40 MG tablet TAKE 1 TABLET BY MOUTH  DAILY. MAY TAKE EXTRA 1/2  TABLET AS NEEDED FOR  SWELLING. 135 tablet 3  . levothyroxine (SYNTHROID, LEVOTHROID) 50 MCG tablet Take 50 mcg by mouth daily before breakfast.     . loratadine (CLARITIN) 10 MG tablet Take 10 mg by mouth daily as needed for allergies.    . Magnesium 500 MG TABS Take 500 mg by mouth at bedtime.    [name] spironolactone (ALDACTONE) 25 MG tablet TAKE 1/2 TABLET BY MOUTH EVERY DAY 135 tablet 1   No current facility-administered medications for this encounter.    Allergies:   Patient has no known allergies.   Social History:  The patient  reports that she has never smoked. She has never used smokeless tobacco. She reports current alcohol use. She reports that she does not use drugs.   Family History:  The patient's family history includes CAD in an other family member; Cancer in her father; Diabetes in her mother; Multiple sclerosis in her mother.   ROS:  Please see the history of present illness.   All other systems are personally reviewed and negative.   Vitals:   10/09/19 1106  BP: 102/64  Pulse: 87  SpO2: 99%  Weight: 60 kg (132 lb 6 oz)     Exam:  General:  Well appearing. No resp difficulty HEENT:  normal Neck: supple. no JVD. Carotids 2+ bilat; no bruits. No lymphadenopathy or thryomegaly appreciated. Cor: PMI nondisplaced. Regular rate & rhythm. 2/6 TR Lungs: clear Abdomen: soft, nontender, nondistended. No hepatosplenomegaly. No bruits or masses. Good bowel sounds. Extremities: no cyanosis, clubbing, rash, edema Neuro: alert & orientedx3, cranial nerves grossly intact. moves all 4 extremities w/o difficulty. Affect pleasant   Recent Labs: 06/05/2019: B Natriuretic Peptide 191.8; BUN 27; Creatinine, Ser 2.03; Potassium 4.0; Sodium 141  Personally reviewed   Wt Readings from Last 3 Encounters:  10/09/19 60 kg (132 lb 6 oz)  06/05/19 63.2 kg (139 lb  6.4 oz)  03/04/19 62.9 kg (138 lb 9.6 oz)      ASSESSMENT AND PLAN:   1. Chronic Systolic Heart Failure due to NICM. S/p Medtronic BiV ICD. Likely familial CM.  - Echo 12/14/17: EF 20-25%, trivial AI, mild to mod MR, mildly reduced RV, severely dilated RA and LA, PA pressures moderately to severely increased.  - Echo 8/20 EF 25% RV normal Personally reviewed - CPX 12/27/17: Peak VO2 13.5, slope 42 - CPX 05/16/18: pVO2 12.1, slope 48 - LHC 02/02/18 with normal coronaries. RHC showed severe NICM with well compensated hemodynamics. - Repeat RHC 05/17/18: NICM with elevated filling pressures and relatively preserved output, suggestive of significant MR - Remains stable with NYHA II-III symptoms. No change from previous - Volume status ok  - Blood type AB-pos . She has been seen by Dr. Mosetta Pigeon at [facility] and felt not to be candidate for heart-kidney transplant but might be candidate for high-risk VAD. [place] declined seeing her for heart-kidney transplant due to age.   - Volume trending up slightly. Continue lasix 40 mg daily. She takes an extra 20 mg PRN for weight 135 or greater. Encouraged her to take extra dose soon  - Continue coreg 3.125 mg BID - Continue entresto 49/51 mg BID - Continue spiro 12.5.  - Will not titrate with low BP.  -  Creatinine > 2.0 (CFR 25) so have not started [place] - We discussed VAD again today but says she appears stable and wants to hold off for now. Although she reports NYHA III symptoms CPX and activity level on ICD suggest worse functional capacity. If getting worse may have one last chance to consider high-risk VAD.   2. PAF - Remains in NSR on amio 100 daily. No evidence AF on ICD - With Cr > 1.5 and weight recently < 60 kg continue Eliquis at 2.5 bid - No bleeding currently   3. CKD IV, baseline creatinine ~1.7-1.9 - las creatinine 2.03 (GFR 29) - will recheck today  4. MR - Mild to moderate on most recent echo.No change  6. Hx of VT s/p Medtronic BiV ICD - ICD interrogated personally today. No VT/AF Continue amiodarone 100 mg day   Glori Bickers, MD  10/09/2019 11:24 AM  Advanced Heart Failure [address] [place] [id number] [phone] (office) [phone] (fax)

## 2019-10-09 NOTE — Patient Instructions (Signed)
Routine lab work today. Will notify you of abnormal results  Follow up in 4 months.

## 2019-10-28 ENCOUNTER — Ambulatory Visit (INDEPENDENT_AMBULATORY_CARE_PROVIDER_SITE_OTHER): Payer: Medicare PPO

## 2019-10-28 DIAGNOSIS — I5022 Chronic systolic (congestive) heart failure: Secondary | ICD-10-CM

## 2019-10-28 DIAGNOSIS — Z95810 Presence of automatic (implantable) cardiac defibrillator: Secondary | ICD-10-CM | POA: Diagnosis not present

## 2019-10-30 NOTE — Progress Notes (Signed)
EPIC Encounter for ICM Monitoring  Patient Name: Kristi Parker is a 69 y.o. female Date: 10/30/2019 Primary Care Physican: [name], MD Primary Cardiologist:[name] Electrophysiologist: Allred Bi-V Pacing: 91.3% LastWeight:139.6lbs    Spoke with patient and reports feeling well at this time.  Denies fluid symptoms.    Optivol thoracic impedance normal.   Prescribed:Furosemide40 mg-Take 1 tablet by mouth daily.May take anextra0.5 tablet as needed for swelling or weight > 135 lbs.  Labs:  06/05/2019 Creatinine 2.05, BUN 27, Potassium 4.0, Sodium 141, GFR 25-29 03/11/2019 Creatinine 1.95, BUN 24, Potassium 4.0, Sodium 139, GFR 26-30 03/04/2019 Creatinine 2.08, BUN 24, Potassium 4.3, Sodium 137, GFR 24-28 12/05/2018 Creatinine 1.76, BUN 19, Potassium 4.2, Sodium 140, GFR 29-34 A complete set of results can be found in Results Review.  Recommendations:No changes and encouraged to call if experiencing any fluid symptoms.  Follow-up plan: ICM clinic phone appointment on4/19/2021. 91 day device clinic remote transmission 12/06/2019.Office appt 01/29/2020 with Dr.Bensimhon.   Copy of ICM check sent to Dr.Allred.   3 month ICM trend: 10/28/2019    1 Year ICM trend:       [name], RN 10/30/2019 1:59 PM

## 2019-11-04 ENCOUNTER — Other Ambulatory Visit (HOSPITAL_COMMUNITY): Payer: Self-pay | Admitting: *Deleted

## 2019-11-04 ENCOUNTER — Encounter (HOSPITAL_COMMUNITY): Payer: Self-pay

## 2019-11-04 MED ORDER — FUROSEMIDE 40 MG PO TABS: ORAL_TABLET | ORAL | 3 refills | Status: DC

## 2019-11-04 MED ORDER — AMIODARONE HCL 200 MG PO TABS: 100.0000 mg | ORAL_TABLET | Freq: Every day | ORAL | 2 refills | Status: DC

## 2019-11-04 MED ORDER — APIXABAN 2.5 MG PO TABS: 2.5000 mg | ORAL_TABLET | Freq: Two times a day (BID) | ORAL | 2 refills | Status: DC

## 2019-11-09 ENCOUNTER — Other Ambulatory Visit: Payer: Self-pay | Admitting: Cardiology

## 2019-12-05 ENCOUNTER — Other Ambulatory Visit: Payer: Self-pay | Admitting: Cardiology

## 2019-12-06 ENCOUNTER — Ambulatory Visit (INDEPENDENT_AMBULATORY_CARE_PROVIDER_SITE_OTHER): Payer: Medicare PPO | Admitting: *Deleted

## 2019-12-06 DIAGNOSIS — I5022 Chronic systolic (congestive) heart failure: Secondary | ICD-10-CM | POA: Diagnosis not present

## 2019-12-06 LAB — CUP PACEART REMOTE DEVICE CHECK
Battery Remaining Longevity: 28 mo
Battery Voltage: 2.95 V
Brady Statistic AP VP Percent: 97.03 %
Brady Statistic AP VS Percent: 2.96 %
Brady Statistic AS VP Percent: 0 %
Brady Statistic AS VS Percent: 0 %
Brady Statistic RA Percent Paced: 99.99 %
Brady Statistic RV Percent Paced: 91.41 %
Date Time Interrogation Session: 20210423022823
HighPow Impedance: 37 Ohm
Implantable Lead Implant Date: 20091104
Implantable Lead Implant Date: 20091104
Implantable Lead Implant Date: 20171214
Implantable Lead Location: 753858
Implantable Lead Location: 753859
Implantable Lead Location: 753860
Implantable Lead Model: 4196
Implantable Lead Model: 5076
Implantable Pulse Generator Implant Date: 20171214
Lead Channel Impedance Value: 1140 Ohm
Lead Channel Impedance Value: 361 Ohm
Lead Channel Impedance Value: 418 Ohm
Lead Channel Impedance Value: 475 Ohm
Lead Channel Impedance Value: 513 Ohm
Lead Channel Impedance Value: 760 Ohm
Lead Channel Pacing Threshold Amplitude: 0.375 V
Lead Channel Pacing Threshold Amplitude: 0.625 V
Lead Channel Pacing Threshold Amplitude: 2.375 V
Lead Channel Pacing Threshold Pulse Width: 0.4 ms
Lead Channel Pacing Threshold Pulse Width: 0.4 ms
Lead Channel Pacing Threshold Pulse Width: 0.6 ms
Lead Channel Sensing Intrinsic Amplitude: 0.125 mV
Lead Channel Sensing Intrinsic Amplitude: 0.125 mV
Lead Channel Sensing Intrinsic Amplitude: 6.25 mV
Lead Channel Sensing Intrinsic Amplitude: 6.25 mV
Lead Channel Setting Pacing Amplitude: 2 V
Lead Channel Setting Pacing Amplitude: 2.5 V
Lead Channel Setting Pacing Amplitude: 4 V
Lead Channel Setting Pacing Pulse Width: 0.4 ms
Lead Channel Setting Pacing Pulse Width: 0.6 ms
Lead Channel Setting Sensing Sensitivity: 0.3 mV

## 2019-12-06 NOTE — Progress Notes (Signed)
ICD Remote  

## 2019-12-09 ENCOUNTER — Ambulatory Visit (INDEPENDENT_AMBULATORY_CARE_PROVIDER_SITE_OTHER): Payer: Medicare PPO

## 2019-12-09 DIAGNOSIS — Z95810 Presence of automatic (implantable) cardiac defibrillator: Secondary | ICD-10-CM | POA: Diagnosis not present

## 2019-12-09 DIAGNOSIS — I5022 Chronic systolic (congestive) heart failure: Secondary | ICD-10-CM | POA: Diagnosis not present

## 2019-12-11 NOTE — Progress Notes (Signed)
EPIC Encounter for ICM Monitoring  Patient Name: Kristi Parker is a 69 y.o. female Date: 12/11/2019 Primary Care Physican: [name], MD Primary Cardiologist:[name] Electrophysiologist: Allred Bi-V Pacing: 92% 4/28/2021Weight:133 lbs    Spoke with patient and reports feeling well at this time. Denies fluid symptoms.   Optivol thoracic impedance normal.   Prescribed:Furosemide40 mg-Take 1 tablet by mouth daily.May take anextra0.5 tablet as needed for swelling or weight > 135 lbs.  Labs: 10/09/2019 Creatinine 1.82, BUN 13, Potassium 4.0, Sodium 143, GFR 28-33 06/05/2019 Creatinine 2.05, BUN 27, Potassium 4.0, Sodium 141, GFR 25-29 A complete set of results can be found in Results Review.  Recommendations:No changes and encouraged to call if experiencing any fluid symptoms.  Follow-up plan: ICM clinic phone appointment on6/08/2019. 91 day device clinic remote transmission7/23/2021.Office appt 01/29/2020 with Dr.Bensimhon.   Copy of ICM check sent to Dr.Allred.  3 month ICM trend: 12/09/2019    1 Year ICM trend:       [name], RN 12/11/2019 10:58 AM

## 2019-12-29 ENCOUNTER — Other Ambulatory Visit: Payer: Self-pay | Admitting: Cardiology

## 2020-01-02 ENCOUNTER — Other Ambulatory Visit: Payer: Self-pay | Admitting: Internal Medicine

## 2020-01-02 DIAGNOSIS — E2839 Other primary ovarian failure: Secondary | ICD-10-CM

## 2020-01-07 ENCOUNTER — Other Ambulatory Visit (HOSPITAL_COMMUNITY): Payer: Self-pay | Admitting: Internal Medicine

## 2020-01-14 ENCOUNTER — Ambulatory Visit (INDEPENDENT_AMBULATORY_CARE_PROVIDER_SITE_OTHER): Payer: Medicare PPO

## 2020-01-14 DIAGNOSIS — Z95810 Presence of automatic (implantable) cardiac defibrillator: Secondary | ICD-10-CM

## 2020-01-14 DIAGNOSIS — I5022 Chronic systolic (congestive) heart failure: Secondary | ICD-10-CM | POA: Diagnosis not present

## 2020-01-17 NOTE — Progress Notes (Signed)
EPIC Encounter for ICM Monitoring  Patient Name: Kristi Parker is a 69 y.o. female Date: 01/17/2020 Primary Care Physican: [name], MD Primary Cardiologist:[name] Electrophysiologist: Allred Bi-V Pacing: 90.3% 6/4/2021Weight:133 lbs    Spoke with patient and reports feeling well at this time.  Denies fluid symptoms.    Optivol thoracic impedance normal.   Prescribed:Furosemide40 mg-Take 1 tablet by mouth daily.May take anextra0.5 tablet as needed for swelling or weight > 135 lbs.  Labs: 10/09/2019 Creatinine 1.82, BUN 13, Potassium 4.0, Sodium 143, GFR 28-33 06/05/2019 Creatinine 2.05, BUN 27, Potassium 4.0, Sodium 141, GFR 25-29 A complete set of results can be found in Results Review.  Recommendations: No changes and encouraged to call if experiencing any fluid symptoms.  Follow-up plan: ICM clinic phone appointment on7/01/2020. 91 day device clinic remote transmission7/23/2021.Office appt6/16/2021 with [place].   Copy of ICM check sent to Dr.Allred.  3 month ICM trend: 01/14/2020    1 Year ICM trend:       [name], RN 01/17/2020 8:29 AM

## 2020-01-21 ENCOUNTER — Other Ambulatory Visit: Payer: Self-pay | Admitting: Cardiology

## 2020-01-28 NOTE — Progress Notes (Signed)
Advance HF Clinic Note   Date:  01/29/2020   ID:  Kristi Parker, DOB 1951-01-21, MRN [id number]  Location: Home  Provider location: [facility] Type of Visit: Established patient  PCP:  [name], MD  Cardiologist:  Candee Furbish, MD Primary HF: Karissa Meenan  Chief Complaint: Heart Failure follow-up   History of Present Illness:  Kristi Parker is a 69 y.o. female with history of VT, severe systolic HF due to NICM (EF 20-25%) s/p Medtronic BiV ICD, CVA 2004, PAF, and CKD III-IV.   She was first diagnosed with HF in 2013. She is unsure of the cause. She had a heart cath and did not have any blockages. We also follow her son in HF clinic for NICM. She states that multiple family members have severe HF and several have underwent transplant.  Based on CPX testing, we referred her to Dr. Mosetta Pigeon at [facility]. She was seen there on 02/22/18 and they discussed need for advanced therapies including possible heart or heart-kidney transplant. She had f/u with them subsequently and felt not to be candidate for heart-kidney transplant but might be candidate for high-risk VAD. We reviewed case with [facility] and she was also turned down for heart/kidney transplant due to age.  Had repeat CPX in 10/19 with low Vo2 (13.5) and high slope (42). Repeat RHC done on 10//193 as below as part of transplant work up at [facility] showed elevated volume with CI 2.7  She returns for routine f/u. Says she feels good. Gets around and does her exercises at home. No change from previous. No edema, orthopnea or PND. Feels she is in the green zone. Mild dizziness on standing.   Echo 8/20 EF 25% RV normal Personally reviewed   Medtronic ICD interrogation today. Optivol climbing slowly. No AF/VT. Activity ~1hr/day Personally reviewed   Past Medical History:  Diagnosis Date  . Adenomatous colon polyp   . Cervical radiculopathy at C5   . Chronic systolic heart failure ([place])   . CKD  (chronic kidney disease), stage III   . DDD (degenerative disc disease), cervical   . DDD (degenerative disc disease), lumbar   . Fibroid   . GERD (gastroesophageal reflux disease)   . Hearing loss 02/2010   L hearing loss/vertigo, steroids  . History of seizure disorder    Related Hx  . Mitral regurgitation    moderate  . Multiple sclerosis ([place]) 1984  . Nonischemic cardiomyopathy ([facility])    Moderate LVEF 35-40% by ECHO 2011, 25-30% by echo 2013  . Permanent atrial fibrillation ([place])   . Pulmonary hypertension, secondary 06/04/2013   As a result of nonischemic cardiomyopathy EF 25-30%  . Stroke [facility])    Right brain CVA, complete recovery 07/2003  . Ventricular tachycardia ([place])   . Vertigo 02/2010   L hearing loss/vertigo, steroids   Past Surgical History:  Procedure Laterality Date  . BIV PACEMAKER GENERATOR CHANGE OUT N/A 09/18/2014   Procedure: BIV PACEMAKER GENERATOR CHANGE OUT;  Surgeon: Thompson Grayer, MD;  Location: [facility] CATH LAB;  Service: Cardiovascular;  Laterality: N/A;  . DILATION AND CURETTAGE OF UTERUS    . EP IMPLANTABLE DEVICE N/A 07/28/2016   Procedure: ICD Implant;  Surgeon: Evans Lance, MD;  Location: [place] CV LAB;  Service: Cardiovascular;  Laterality: N/A;  . HYSTEROSCOPY    . PACEMAKER INSERTION     PTVP 08/2001 for complete heart block. Upgrade PTVP to MDT BiV 06/2008 by Dr Leonia Reeves  .  RIGHT HEART CATH N/A 05/17/2018   Procedure: RIGHT HEART CATH;  Surgeon: Jolaine Artist, MD;  Location: [place] CV LAB;  Service: Cardiovascular;  Laterality: N/A;  . RIGHT/LEFT HEART CATH AND CORONARY ANGIOGRAPHY N/A 02/02/2018   Procedure: RIGHT/LEFT HEART CATH AND CORONARY ANGIOGRAPHY;  Surgeon: Jolaine Artist, MD;  Location: [place] CV LAB;  Service: Cardiovascular;  Laterality: N/A;  . TUBAL LIGATION       Current Outpatient Medications  Medication Sig Dispense Refill  . ALPHAGAN P 0.1 % SOLN Place 1 drop into both eyes 2 (two) times daily.     [name]  amiodarone (PACERONE) 200 MG tablet Take 0.5 tablets (100 mg total) by mouth daily. 135 tablet 2  . apixaban (ELIQUIS) 2.5 MG TABS tablet Take 1 tablet (2.5 mg total) by mouth 2 (two) times daily. 180 tablet 2  . Biotin 2500 MCG CAPS Take 2,500 mcg by mouth at bedtime.    . Calcium Carbonate-Vitamin D (CALCIUM + D PO) Take 1 tablet by mouth every evening.     . carvedilol (COREG) 3.125 MG tablet Take 1 tablet (3.125 mg total) by mouth 2 (two) times daily. Please keep upcoming appt for future refills 60 tablet 0  . ENTRESTO 49-51 MG TAKE 1 TABLET BY MOUTH TWICE A DAY 180 tablet 3  . fexofenadine (ALLEGRA) 180 MG tablet Take 180 mg by mouth daily as needed for allergies.     . furosemide (LASIX) 40 MG tablet TAKE 1 TABLET BY MOUTH  DAILY. MAY TAKE EXTRA 1/2  TABLET AS NEEDED FOR  SWELLING. 135 tablet 3  . levothyroxine (SYNTHROID, LEVOTHROID) 50 MCG tablet Take 50 mcg by mouth daily before breakfast.     . loratadine (CLARITIN) 10 MG tablet Take 10 mg by mouth daily as needed for allergies.    . Magnesium 500 MG TABS Take 500 mg by mouth at bedtime.    [name] spironolactone (ALDACTONE) 25 MG tablet TAKE 1/2 TABLET BY MOUTH EVERY DAY 135 tablet 1  . acetaminophen (TYLENOL) 500 MG tablet Take 500-1,000 mg by mouth every 6 (six) hours as needed (for back pain). (Patient not taking: Reported on 01/29/2020)     No current facility-administered medications for this encounter.    Allergies:   Patient has no known allergies.   Social History:  The patient  reports that she has never smoked. She has never used smokeless tobacco. She reports current alcohol use. She reports that she does not use drugs.   Family History:  The patient's family history includes CAD in an other family member; Cancer in her father; Diabetes in her mother; Multiple sclerosis in her mother.   ROS:  Please see the history of present illness.   All other systems are personally reviewed and negative.   Vitals:   01/29/20 1139  BP:  (!) 92/54  Pulse: 89  SpO2: 99%  Weight: 60.5 kg (133 lb 6.4 oz)     Exam:  General:  Well appearing. No resp difficulty HEENT: normal Neck: supple. no JVD. Carotids 2+ bilat; no bruits. No lymphadenopathy or thryomegaly appreciated. Cor: PMI nondisplaced. Regular rate & rhythm. No rubs, gallops or murmurs. Lungs: clear Abdomen: soft, nontender, nondistended. No hepatosplenomegaly. No bruits or masses. Good bowel sounds. Extremities: no cyanosis, clubbing, rash, edema Neuro: alert & orientedx3, cranial nerves grossly intact. moves all 4 extremities w/o difficulty. Affect pleasant  Recent Labs: 10/09/2019: B Natriuretic Peptide 169.9; BUN 13; Creatinine, Ser 1.82; Potassium 4.0; Sodium 143  Personally reviewed  Wt Readings from Last 3 Encounters:  01/29/20 60.5 kg (133 lb 6.4 oz)  10/09/19 60 kg (132 lb 6 oz)  06/05/19 63.2 kg (139 lb 6.4 oz)      ASSESSMENT AND PLAN:   1. Chronic Systolic Heart Failure due to NICM. S/p Medtronic BiV ICD. Likely familial CM.  - Echo 12/14/17: EF 20-25%, trivial AI, mild to mod MR, mildly reduced RV, severely dilated RA and LA, PA pressures moderately to severely increased.  - Echo 8/20 EF 25% RV normal Personally reviewed - CPX 12/27/17: Peak VO2 13.5, slope 42 - CPX 05/16/18: pVO2 12.1, slope 48 - LHC 02/02/18 with normal coronaries. RHC showed severe NICM with well compensated hemodynamics. - Repeat RHC 05/17/18: NICM with elevated filling pressures and relatively preserved output, suggestive of significant MR - Remains stable with NYHA II-III - Volume status looks good on exam and ICD interrogation - Blood type AB-pos . She has been seen by Dr. Mosetta Pigeon at [facility] and felt not to be candidate for heart-kidney transplant but might be candidate for high-risk VAD. [facility] declined seeing her for heart-kidney transplant due to age.   - Continue coreg 3.125 mg BID - Continue entresto 49/51 mg BID - Continue spiro 12.5.  - Will not titrate with low BP.  -  Will stop lasix and switch to Farxiga 10 - We discussed VAD again today (versus repeating CPX) but says she appears stable and wants to hold off for now. Although she reports NYHA III symptoms CPX and activity level on ICD suggest worse functional capacity. If getting worse may have one last chance to consider high-risk VAD.  - ICD interrogated personally. No VT/AF. Activity level < 1hr/day. Optivol low.   2. PAF - Remains in NSR on amio 100 daily. No evidence AF on ICD.  - With Cr > 1.5 and weight recently < 60 kg continue Eliquis at 2.5 bid - No bleeding currently   3. CKD IV, baseline creatinine ~1.7-1.9 - last creatinine 2.03 (GFR 29) - recheck today  4. MR - Mild to moderate on most recent echo. No change  6. Hx of VT s/p Medtronic BiV ICD - ICD interrogated personally today. No VT. Continue amiodarone 100 mg day   Glori Bickers, MD  01/29/2020 11:55 AM  Advanced Heart Failure [place] [address] [phone] (office) [phone] (fax)

## 2020-01-29 ENCOUNTER — Encounter (HOSPITAL_COMMUNITY): Payer: Self-pay | Admitting: Internal Medicine

## 2020-01-29 ENCOUNTER — Other Ambulatory Visit: Payer: Self-pay

## 2020-01-29 ENCOUNTER — Ambulatory Visit (HOSPITAL_COMMUNITY)
Admission: RE | Admit: 2020-01-29 | Discharge: 2020-01-29 | Disposition: A | Discharge status: Home / Self Care | Payer: Medicare PPO | Source: Ambulatory Visit | Attending: Internal Medicine | Admitting: Internal Medicine

## 2020-01-29 VITALS — BP 92/54 | HR 89 | Wt 133.4 lb

## 2020-01-29 DIAGNOSIS — I428 Other cardiomyopathies: Secondary | ICD-10-CM | POA: Diagnosis not present

## 2020-01-29 DIAGNOSIS — Z95810 Presence of automatic (implantable) cardiac defibrillator: Secondary | ICD-10-CM | POA: Diagnosis not present

## 2020-01-29 DIAGNOSIS — K219 Gastro-esophageal reflux disease without esophagitis: Secondary | ICD-10-CM | POA: Insufficient documentation

## 2020-01-29 DIAGNOSIS — H9192 Unspecified hearing loss, left ear: Secondary | ICD-10-CM | POA: Diagnosis not present

## 2020-01-29 DIAGNOSIS — Z8249 Family history of ischemic heart disease and other diseases of the circulatory system: Secondary | ICD-10-CM | POA: Insufficient documentation

## 2020-01-29 DIAGNOSIS — I4821 Permanent atrial fibrillation: Secondary | ICD-10-CM | POA: Insufficient documentation

## 2020-01-29 DIAGNOSIS — N184 Chronic kidney disease, stage 4 (severe): Secondary | ICD-10-CM | POA: Insufficient documentation

## 2020-01-29 DIAGNOSIS — I5022 Chronic systolic (congestive) heart failure: Secondary | ICD-10-CM | POA: Diagnosis present

## 2020-01-29 DIAGNOSIS — I472 Ventricular tachycardia, unspecified: Secondary | ICD-10-CM

## 2020-01-29 DIAGNOSIS — Z950 Presence of cardiac pacemaker: Secondary | ICD-10-CM | POA: Insufficient documentation

## 2020-01-29 DIAGNOSIS — I340 Nonrheumatic mitral (valve) insufficiency: Secondary | ICD-10-CM | POA: Insufficient documentation

## 2020-01-29 DIAGNOSIS — Z7901 Long term (current) use of anticoagulants: Secondary | ICD-10-CM | POA: Diagnosis not present

## 2020-01-29 DIAGNOSIS — I480 Paroxysmal atrial fibrillation: Secondary | ICD-10-CM | POA: Diagnosis not present

## 2020-01-29 DIAGNOSIS — Z79899 Other long term (current) drug therapy: Secondary | ICD-10-CM | POA: Diagnosis not present

## 2020-01-29 DIAGNOSIS — G40909 Epilepsy, unspecified, not intractable, without status epilepticus: Secondary | ICD-10-CM | POA: Diagnosis not present

## 2020-01-29 DIAGNOSIS — G35 Multiple sclerosis: Secondary | ICD-10-CM | POA: Insufficient documentation

## 2020-01-29 DIAGNOSIS — I272 Pulmonary hypertension, unspecified: Secondary | ICD-10-CM | POA: Diagnosis not present

## 2020-01-29 DIAGNOSIS — Z8673 Personal history of transient ischemic attack (TIA), and cerebral infarction without residual deficits: Secondary | ICD-10-CM | POA: Diagnosis not present

## 2020-01-29 LAB — BASIC METABOLIC PANEL
Anion gap: 9 (ref 5–15)
BUN: 23 mg/dL (ref 8–23)
CO2: 25 mmol/L (ref 22–32)
Calcium: 9.6 mg/dL (ref 8.9–10.3)
Chloride: 104 mmol/L (ref 98–111)
Creatinine, Ser: 2.1 mg/dL — ABNORMAL HIGH (ref 0.44–1.00)
GFR calc Af Amer: 27 mL/min — ABNORMAL LOW (ref >60)
GFR calc non Af Amer: 24 mL/min — ABNORMAL LOW (ref >60)
Glucose, Bld: 90 mg/dL (ref 70–99)
Potassium: 4.1 mmol/L (ref 3.5–5.1)
Sodium: 138 mmol/L (ref 135–145)

## 2020-01-29 LAB — BRAIN NATRIURETIC PEPTIDE: B Natriuretic Peptide: 147.4 pg/mL — ABNORMAL HIGH (ref 0.0–100.0)

## 2020-01-29 MED ORDER — DAPAGLIFLOZIN PROPANEDIOL 10 MG PO TABS: 10.0000 mg | ORAL_TABLET | Freq: Every day | ORAL | 6 refills | Status: DC

## 2020-01-29 MED ORDER — FUROSEMIDE 40 MG PO TABS: 40.0000 mg | ORAL_TABLET | Freq: Every day | ORAL | 3 refills | Status: DC | PRN

## 2020-01-29 NOTE — Patient Instructions (Signed)
Stop Furosemide, ONLY TAKE IF NEEDED  Start Farxiga 10 mg daily  Labs done today, your results will be available in MyChart, we will contact you for abnormal readings.  Labs needed again in 2 weeks  Please call our office in October to schedule your follow up appointment  If you have any questions or concerns before your next appointment please send [place] a message through [place] or call our office at [phone].    TO LEAVE A MESSAGE FOR THE NURSE SELECT OPTION 2, PLEASE LEAVE A MESSAGE INCLUDING: . YOUR NAME . DATE OF BIRTH . CALL BACK NUMBER . REASON FOR CALL**this is important as we prioritize the call backs  [place] AS LONG AS YOU CALL BEFORE 4:00 PM  At the [facility], you and your health needs are our priority. As part of our continuing mission to provide you with exceptional heart care, we have created designated Provider Care Teams. These Care Teams include your primary Cardiologist (physician) and Advanced Practice Providers (APPs- Physician Assistants and Nurse Practitioners) who all work together to provide you with the care you need, when you need it.   You may see any of the following providers on your designated Care Team at your next follow up: [name] Dr Glori Bickers . Dr Loralie Champagne . Darrick Grinder, NP . Lyda Jester, PA . Audry Riles, PharmD   Please be sure to bring in all your medications bottles to every appointment.

## 2020-02-12 ENCOUNTER — Other Ambulatory Visit: Payer: Self-pay

## 2020-02-12 ENCOUNTER — Ambulatory Visit (HOSPITAL_COMMUNITY)
Admission: RE | Admit: 2020-02-12 | Discharge: 2020-02-12 | Disposition: A | Discharge status: Home / Self Care | Payer: Medicare PPO | Source: Ambulatory Visit | Attending: Internal Medicine | Admitting: Internal Medicine

## 2020-02-12 DIAGNOSIS — I5022 Chronic systolic (congestive) heart failure: Secondary | ICD-10-CM | POA: Diagnosis present

## 2020-02-12 LAB — BASIC METABOLIC PANEL
Anion gap: 8 (ref 5–15)
BUN: 18 mg/dL (ref 8–23)
CO2: 25 mmol/L (ref 22–32)
Calcium: 9.5 mg/dL (ref 8.9–10.3)
Chloride: 108 mmol/L (ref 98–111)
Creatinine, Ser: 2.24 mg/dL — ABNORMAL HIGH (ref 0.44–1.00)
GFR calc Af Amer: 25 mL/min — ABNORMAL LOW (ref >60)
GFR calc non Af Amer: 22 mL/min — ABNORMAL LOW (ref >60)
Glucose, Bld: 103 mg/dL — ABNORMAL HIGH (ref 70–99)
Potassium: 4 mmol/L (ref 3.5–5.1)
Sodium: 141 mmol/L (ref 135–145)

## 2020-02-13 DIAGNOSIS — Z1231 Encounter for screening mammogram for malignant neoplasm of breast: Secondary | ICD-10-CM | POA: Diagnosis not present

## 2020-02-13 DIAGNOSIS — M85851 Other specified disorders of bone density and structure, right thigh: Secondary | ICD-10-CM | POA: Diagnosis not present

## 2020-02-13 DIAGNOSIS — Z780 Asymptomatic menopausal state: Secondary | ICD-10-CM | POA: Diagnosis not present

## 2020-02-13 DIAGNOSIS — M85852 Other specified disorders of bone density and structure, left thigh: Secondary | ICD-10-CM | POA: Diagnosis not present

## 2020-02-14 ENCOUNTER — Other Ambulatory Visit: Payer: Self-pay | Admitting: Cardiology

## 2020-02-18 ENCOUNTER — Telehealth (HOSPITAL_COMMUNITY): Payer: Self-pay | Admitting: Pharmacy Technician

## 2020-02-18 ENCOUNTER — Telehealth (HOSPITAL_COMMUNITY): Payer: Self-pay | Admitting: Pharmacist

## 2020-02-18 ENCOUNTER — Ambulatory Visit (INDEPENDENT_AMBULATORY_CARE_PROVIDER_SITE_OTHER): Payer: Medicare PPO

## 2020-02-18 DIAGNOSIS — I5022 Chronic systolic (congestive) heart failure: Secondary | ICD-10-CM | POA: Diagnosis not present

## 2020-02-18 DIAGNOSIS — Z95810 Presence of automatic (implantable) cardiac defibrillator: Secondary | ICD-10-CM | POA: Diagnosis not present

## 2020-02-18 NOTE — Telephone Encounter (Signed)
Received a tier exception for [place] for patient. Her 30 day co-pay for Wilder Glade is currently $64 (she is in the coverage gap). Vania Rea is a covered alternative and is $40, so we will switch her to that.  Called to inform patient of this change, left voicemail for her to follow up with me.

## 2020-02-19 ENCOUNTER — Telehealth: Payer: Self-pay

## 2020-02-19 NOTE — Telephone Encounter (Signed)
Remote ICM transmission received.  Attempted call to patient regarding ICM remote transmission and left detailed message per DPR.  Advised to return call for any fluid symptoms or questions. Next ICM remote transmission scheduled 03/23/2020.

## 2020-02-19 NOTE — Telephone Encounter (Signed)
Opened in error

## 2020-02-19 NOTE — Progress Notes (Signed)
EPIC Encounter for ICM Monitoring  Patient Name: Kristi Parker is a 69 y.o. female Date: 02/19/2020 Primary Care Physican: [name], MD Primary Cardiologist:[name] Electrophysiologist: Allred Bi-V Pacing: 91.1% 6/4/2021Weight:133lbs    Attempted call to patient and unable to reach.  Left detailed message per DPR regarding transmission. Transmission reviewed.   Optivol thoracic impedance normal.   Prescribed:Furosemide40 mg-Take 1 tablet by mouth daily as needed  Labs: 02/12/2020 Creatinine 2.24, BUN 18, Potassium 4.0, Sodium 141, GFR 22-25 01/29/2020 Creatinine 2.10, BUN 23, Potassium 4.1, Sodium 138, GFR 24-27  10/09/2019 Creatinine 1.82, BUN 13, Potassium 4.0, Sodium 143, GFR 28-33 06/05/2019 Creatinine 2.05, BUN 27, Potassium 4.0, Sodium 141, GFR 25-29 A complete set of results can be found in Results Review.  Recommendations:Left voice mail with ICM number and encouraged to call if experiencing any fluid symptoms.  Follow-up plan: ICM clinic phone appointment on8/04/2020. 91 day device clinic remote transmission7/23/2021.  EP/Cardiology Office Visits: Message to EP scheduler to call patient since last EP office visit was 01/03/2018.    Copy of ICM check sent to Dr. Rayann Heman.   3 month ICM trend: 02/18/2020    1 Year ICM trend:       [name], RN 02/19/2020 2:32 PM

## 2020-02-20 ENCOUNTER — Telehealth (HOSPITAL_COMMUNITY): Payer: Self-pay | Admitting: *Deleted

## 2020-02-20 ENCOUNTER — Telehealth (HOSPITAL_COMMUNITY): Payer: Self-pay | Admitting: Pharmacist

## 2020-02-20 NOTE — Telephone Encounter (Signed)
Patient Advocate Encounter   Received notification from [facility] that tier exception for Wilder Glade is required.   Tier exception submitted via Fax Status is pending   Will continue to follow.  Audry Riles, PharmD, BCPS, BCCP, CPP Heart Failure Clinic Pharmacist [phone]

## 2020-02-20 NOTE — Telephone Encounter (Signed)
Received fax from [facility], pt needs colonoscopy due to her history of colon polyps, they need clearance and approval to hold her eliquis 2-3 days prior  Per Dr Haroldine Laws: "ok to hold eliquis but would only proceed if absolutely necessary given advanced HF"  Note faxed back to them at [phone]

## 2020-02-21 NOTE — Telephone Encounter (Signed)
After speaking with the patient, we are going to submit a tier exception and change medications if that exception is denied. Lauren sent in the exception already. Will follow up.

## 2020-02-25 NOTE — Telephone Encounter (Signed)
Patient's tier exception was denied. [employer] stated, since there isn't a brand name drug for her condition in a lower cost-sharing tier, a tier exception is not permitted.   Called and spoke with the patient. We are going to apply for patient assistance and if that is denied, she will consider switching to [place].   Emailed patient the [place] application.  Will follow up.

## 2020-02-29 ENCOUNTER — Other Ambulatory Visit (HOSPITAL_COMMUNITY): Payer: Self-pay | Admitting: Internal Medicine

## 2020-03-02 ENCOUNTER — Other Ambulatory Visit: Payer: Self-pay | Admitting: Cardiology

## 2020-03-06 ENCOUNTER — Ambulatory Visit (INDEPENDENT_AMBULATORY_CARE_PROVIDER_SITE_OTHER): Payer: Medicare PPO | Admitting: *Deleted

## 2020-03-06 DIAGNOSIS — I442 Atrioventricular block, complete: Secondary | ICD-10-CM

## 2020-03-06 LAB — CUP PACEART REMOTE DEVICE CHECK
Battery Remaining Longevity: 24 mo
Battery Voltage: 2.94 V
Brady Statistic AP VP Percent: 96.41 %
Brady Statistic AP VS Percent: 3.59 %
Brady Statistic AS VP Percent: 0 %
Brady Statistic AS VS Percent: 0 %
Brady Statistic RA Percent Paced: 100 %
Brady Statistic RV Percent Paced: 90.69 %
Date Time Interrogation Session: 20210723001704
HighPow Impedance: 42 Ohm
Implantable Lead Implant Date: 20091104
Implantable Lead Implant Date: 20091104
Implantable Lead Implant Date: 20171214
Implantable Lead Location: 753858
Implantable Lead Location: 753859
Implantable Lead Location: 753860
Implantable Lead Model: 4196
Implantable Lead Model: 5076
Implantable Pulse Generator Implant Date: 20171214
Lead Channel Impedance Value: 1026 Ohm
Lead Channel Impedance Value: 361 Ohm
Lead Channel Impedance Value: 418 Ohm
Lead Channel Impedance Value: 475 Ohm
Lead Channel Impedance Value: 475 Ohm
Lead Channel Impedance Value: 646 Ohm
Lead Channel Pacing Threshold Amplitude: 0.375 V
Lead Channel Pacing Threshold Amplitude: 0.625 V
Lead Channel Pacing Threshold Amplitude: 3 V
Lead Channel Pacing Threshold Pulse Width: 0.4 ms
Lead Channel Pacing Threshold Pulse Width: 0.4 ms
Lead Channel Pacing Threshold Pulse Width: 0.6 ms
Lead Channel Sensing Intrinsic Amplitude: 0.125 mV
Lead Channel Sensing Intrinsic Amplitude: 0.125 mV
Lead Channel Sensing Intrinsic Amplitude: 6 mV
Lead Channel Sensing Intrinsic Amplitude: 6 mV
Lead Channel Setting Pacing Amplitude: 2 V
Lead Channel Setting Pacing Amplitude: 2.5 V
Lead Channel Setting Pacing Amplitude: 4.75 V
Lead Channel Setting Pacing Pulse Width: 0.4 ms
Lead Channel Setting Pacing Pulse Width: 0.6 ms
Lead Channel Setting Sensing Sensitivity: 0.3 mV

## 2020-03-09 NOTE — Progress Notes (Signed)
Remote ICD transmission.   

## 2020-03-11 ENCOUNTER — Other Ambulatory Visit: Payer: Self-pay | Admitting: Gastroenterology

## 2020-03-11 DIAGNOSIS — Z86010 Personal history of colonic polyps: Secondary | ICD-10-CM

## 2020-03-23 ENCOUNTER — Ambulatory Visit (INDEPENDENT_AMBULATORY_CARE_PROVIDER_SITE_OTHER): Payer: Medicare PPO

## 2020-03-23 DIAGNOSIS — Z95810 Presence of automatic (implantable) cardiac defibrillator: Secondary | ICD-10-CM

## 2020-03-23 DIAGNOSIS — I5022 Chronic systolic (congestive) heart failure: Secondary | ICD-10-CM

## 2020-03-24 ENCOUNTER — Other Ambulatory Visit: Payer: Self-pay | Admitting: Cardiology

## 2020-03-25 NOTE — Progress Notes (Signed)
EPIC Encounter for ICM Monitoring  Patient Name: Kristi Parker is a 69 y.o. female Date: 03/25/2020 Primary Care Physican: [name], MD Primary Cardiologist:[name] Electrophysiologist: Allred Bi-V Pacing: 92.5% 8/11/2021Weight:131.8lbs    Spoke with patient and reports feeling well at this time.  Denies fluid symptoms.    Optivol thoracic impedance normal.   Prescribed:Furosemide40 mgtake 1 tablet by mouth daily. May take extra 1/2 tablet as needed for swelling.  03/23/20 Pt reports taking PRN instead of daily  Labs: 02/12/2020 Creatinine 2.24, BUN 18, Potassium 4.0, Sodium 141, GFR 22-25 01/29/2020 Creatinine 2.10, BUN 23, Potassium 4.1, Sodium 138, GFR 24-27  10/09/2019 Creatinine 1.82, BUN 13, Potassium 4.0, Sodium 143, GFR 28-33 06/05/2019 Creatinine 2.05, BUN 27, Potassium 4.0, Sodium 141, GFR 25-29 A complete set of results can be found in Results Review.  Recommendations:No changes and encouraged to call if experiencing any fluid symptoms.  Follow-up plan: ICM clinic phone appointment on9/13/2021. 91 day device clinic remote transmission10/22/2021.  EP/Cardiology Office Visits: 04/22/2020 with Dr Rayann Heman  Copy of ICM check sent to Dr. Rayann Heman.   3 month ICM trend: 03/23/2020    1 Year ICM trend:       [name], RN 03/25/2020 4:15 PM

## 2020-03-26 NOTE — Telephone Encounter (Signed)
I have yet to receive the patient's assistance application. Called and spoke with her today. She is not interested in patient assistance for Farxiga at this time. She has decided to continue paying for the medication.   Advised her to contact me if she needs assistance in the future.  Charlann Boxer, CPhT

## 2020-03-27 ENCOUNTER — Telehealth (HOSPITAL_COMMUNITY): Payer: Self-pay | Admitting: Pharmacy Technician

## 2020-03-27 NOTE — Telephone Encounter (Signed)
In an effort to continue helping the patient get her medication at the lowest cost possible without patient assistance, I have signed her up for [employer]'s heart failure wait list.  Wait List ID: [id number]  When the grant opens up, I should get a notification that we can apply for a grant for the patient's Farxiga.  Charlann Boxer, CPhT

## 2020-03-31 DIAGNOSIS — H2513 Age-related nuclear cataract, bilateral: Secondary | ICD-10-CM | POA: Diagnosis not present

## 2020-03-31 DIAGNOSIS — H401112 Primary open-angle glaucoma, right eye, moderate stage: Secondary | ICD-10-CM | POA: Diagnosis not present

## 2020-03-31 DIAGNOSIS — H401123 Primary open-angle glaucoma, left eye, severe stage: Secondary | ICD-10-CM | POA: Diagnosis not present

## 2020-04-10 ENCOUNTER — Ambulatory Visit
Admission: RE | Admit: 2020-04-10 | Discharge: 2020-04-10 | Disposition: A | Discharge status: Home / Self Care | Payer: Medicare PPO | Source: Ambulatory Visit | Attending: Gastroenterology | Admitting: Gastroenterology

## 2020-04-10 DIAGNOSIS — Z86010 Personal history of colonic polyps: Secondary | ICD-10-CM

## 2020-04-22 ENCOUNTER — Other Ambulatory Visit: Payer: Self-pay | Admitting: Student

## 2020-04-22 ENCOUNTER — Encounter: Payer: Self-pay | Admitting: Internal Medicine

## 2020-04-22 ENCOUNTER — Other Ambulatory Visit: Payer: Self-pay

## 2020-04-22 ENCOUNTER — Ambulatory Visit: Payer: Medicare PPO | Admitting: Internal Medicine

## 2020-04-22 VITALS — BP 90/58 | HR 79 | Ht 63.0 in | Wt 134.0 lb

## 2020-04-22 DIAGNOSIS — I480 Paroxysmal atrial fibrillation: Secondary | ICD-10-CM | POA: Diagnosis not present

## 2020-04-22 DIAGNOSIS — I5022 Chronic systolic (congestive) heart failure: Secondary | ICD-10-CM | POA: Diagnosis not present

## 2020-04-22 DIAGNOSIS — I472 Ventricular tachycardia, unspecified: Secondary | ICD-10-CM

## 2020-04-22 LAB — CUP PACEART INCLINIC DEVICE CHECK
Battery Remaining Longevity: 22 mo
Battery Voltage: 2.93 V
Brady Statistic AP VP Percent: 96.98 %
Brady Statistic AP VS Percent: 3.02 %
Brady Statistic AS VP Percent: 0 %
Brady Statistic AS VS Percent: 0 %
Brady Statistic RA Percent Paced: 99.98 %
Brady Statistic RV Percent Paced: 92.21 %
Date Time Interrogation Session: 20210908113012
HighPow Impedance: 37 Ohm
Implantable Lead Implant Date: 20091104
Implantable Lead Implant Date: 20091104
Implantable Lead Implant Date: 20171214
Implantable Lead Location: 753858
Implantable Lead Location: 753859
Implantable Lead Location: 753860
Implantable Lead Model: 4196
Implantable Lead Model: 5076
Implantable Pulse Generator Implant Date: 20171214
Lead Channel Impedance Value: 1064 Ohm
Lead Channel Impedance Value: 361 Ohm
Lead Channel Impedance Value: 418 Ohm
Lead Channel Impedance Value: 475 Ohm
Lead Channel Impedance Value: 513 Ohm
Lead Channel Impedance Value: 665 Ohm
Lead Channel Pacing Threshold Amplitude: 0.375 V
Lead Channel Pacing Threshold Amplitude: 0.625 V
Lead Channel Pacing Threshold Amplitude: 2.75 V
Lead Channel Pacing Threshold Pulse Width: 0.4 ms
Lead Channel Pacing Threshold Pulse Width: 0.4 ms
Lead Channel Pacing Threshold Pulse Width: 0.6 ms
Lead Channel Sensing Intrinsic Amplitude: 0.125 mV
Lead Channel Sensing Intrinsic Amplitude: 0.125 mV
Lead Channel Sensing Intrinsic Amplitude: 6.875 mV
Lead Channel Sensing Intrinsic Amplitude: 7 mV
Lead Channel Setting Pacing Amplitude: 2 V
Lead Channel Setting Pacing Amplitude: 2.5 V
Lead Channel Setting Pacing Amplitude: 3.25 V
Lead Channel Setting Pacing Pulse Width: 0.4 ms
Lead Channel Setting Pacing Pulse Width: 0.6 ms
Lead Channel Setting Sensing Sensitivity: 0.3 mV

## 2020-04-22 MED ORDER — CARVEDILOL 3.125 MG PO TABS: 3.1250 mg | ORAL_TABLET | Freq: Two times a day (BID) | ORAL | 0 refills | Status: DC

## 2020-04-22 NOTE — Patient Instructions (Addendum)
Medication Instructions:  Your physician recommends that you continue on your current medications as directed. Please refer to the Current Medication list given to you today.  *If you need a refill on your cardiac medications before your next appointment, please call your pharmacy*  Lab Work: None ordered.  If you have labs (blood work) drawn today and your tests are completely normal, you will receive your results only by: [name] MyChart Message (if you have MyChart) OR . A paper copy in the mail If you have any lab test that is abnormal or we need to change your treatment, we will call you to review the results.  Testing/Procedures: None ordered.  Follow-Up: At [facility], you and your health needs are our priority.  As part of our continuing mission to provide you with exceptional heart care, we have created designated Provider Care Teams.  These Care Teams include your primary Cardiologist (physician) and Advanced Practice Providers (APPs -  Physician Assistants and Nurse Practitioners) who all work together to provide you with the care you need, when you need it.  We recommend signing up for the patient portal called "MyChart".  Sign up information is provided on this After Visit Summary.  MyChart is used to connect with patients for Virtual Visits (Telemedicine).  Patients are able to view lab/test results, encounter notes, upcoming appointments, etc.  Non-urgent messages can be sent to your provider as well.   To learn more about what you can do with MyChart, go to [email or web address].    Your next appointment:   Your physician wants you to follow-up in: 1 year with Oda Kilts. You will receive a reminder letter in the mail two months in advance. If you don't receive a letter, please call our office to schedule the follow-up appointment.  Remote monitoring is used to monitor your ICD from home. This monitoring reduces the number of office visits required to check your device to  one time per year. It allows [place] to keep an eye on the functioning of your device to ensure it is working properly. You are scheduled for a device check from home on 06/05/2020. You may send your transmission at any time that day. If you have a wireless device, the transmission will be sent automatically. After your physician reviews your transmission, you will receive a postcard with your next transmission date.  Other Instructions:

## 2020-04-22 NOTE — Progress Notes (Signed)
PCP: [name], MD Primary Cardiologist: Drs Marlou Porch Bensimhon Primary EP: Dr Rayann Heman  Kristi Parker is a 69 y.o. female who presents today for routine electrophysiology followup.  Since last being seen in our clinic, the patient reports doing reasonably well. She has SOB with moderate activity chronicalyl. Today, she denies symptoms of palpitations, chest pain, lower extremity edema, dizziness, presyncope, syncope, or ICD shocks.  The patient is otherwise without complaint today.   Past Medical History:  Diagnosis Date  . Adenomatous colon polyp   . Cervical radiculopathy at C5   . Chronic systolic heart failure ([place])   . CKD (chronic kidney disease), stage III   . DDD (degenerative disc disease), cervical   . DDD (degenerative disc disease), lumbar   . Fibroid   . GERD (gastroesophageal reflux disease)   . Hearing loss 02/2010   L hearing loss/vertigo, steroids  . History of seizure disorder    Related Hx  . Mitral regurgitation    moderate  . Multiple sclerosis ([place]) 1984  . Nonischemic cardiomyopathy ([facility])    Moderate LVEF 35-40% by ECHO 2011, 25-30% by echo 2013  . Permanent atrial fibrillation ([place])   . Pulmonary hypertension, secondary 06/04/2013   As a result of nonischemic cardiomyopathy EF 25-30%  . Stroke [facility])    Right brain CVA, complete recovery 07/2003  . Ventricular tachycardia ([place])   . Vertigo 02/2010   L hearing loss/vertigo, steroids   Past Surgical History:  Procedure Laterality Date  . BIV PACEMAKER GENERATOR CHANGE OUT N/A 09/18/2014   Procedure: BIV PACEMAKER GENERATOR CHANGE OUT;  Surgeon: Thompson Grayer, MD;  Location: [facility] CATH LAB;  Service: Cardiovascular;  Laterality: N/A;  . DILATION AND CURETTAGE OF UTERUS    . EP IMPLANTABLE DEVICE N/A 07/28/2016   Procedure: ICD Implant;  Surgeon: Evans Lance, MD;  Location: [place] CV LAB;  Service: Cardiovascular;  Laterality: N/A;  . HYSTEROSCOPY    . PACEMAKER INSERTION     PTVP 08/2001 for  complete heart block. Upgrade PTVP to MDT BiV 06/2008 by Dr Leonia Reeves  . RIGHT HEART CATH N/A 05/17/2018   Procedure: RIGHT HEART CATH;  Surgeon: Jolaine Artist, MD;  Location: [place] CV LAB;  Service: Cardiovascular;  Laterality: N/A;  . RIGHT/LEFT HEART CATH AND CORONARY ANGIOGRAPHY N/A 02/02/2018   Procedure: RIGHT/LEFT HEART CATH AND CORONARY ANGIOGRAPHY;  Surgeon: Jolaine Artist, MD;  Location: [place] CV LAB;  Service: Cardiovascular;  Laterality: N/A;  . TUBAL LIGATION      ROS- all systems are reviewed and negative except as per HPI above  Current Outpatient Medications  Medication Sig Dispense Refill  . acetaminophen (TYLENOL) 500 MG tablet Take 500-1,000 mg by mouth every 6 (six) hours as needed (for back pain).     . ALPHAGAN P 0.1 % SOLN Place 1 drop into both eyes 2 (two) times daily.     [name] amiodarone (PACERONE) 200 MG tablet Take 0.5 tablets (100 mg total) by mouth daily. 135 tablet 2  . apixaban (ELIQUIS) 2.5 MG TABS tablet Take 1 tablet (2.5 mg total) by mouth 2 (two) times daily. 180 tablet 2  . Biotin 2500 MCG CAPS Take 2,500 mcg by mouth at bedtime.    . Calcium Carbonate-Vitamin D (CALCIUM + D PO) Take 1 tablet by mouth every evening.     . carvedilol (COREG) 3.125 MG tablet Take 1 tablet (3.125 mg total) by mouth 2 (two) times daily. Please make overdue appt with Dr.  Skains before anymore refills. 3rd and Final Attempt 30 tablet 0  . dapagliflozin propanediol (FARXIGA) 10 MG TABS tablet Take 1 tablet (10 mg total) by mouth daily before breakfast. 30 tablet 6  . ENTRESTO 49-51 MG TAKE 1 TABLET BY MOUTH TWICE A DAY 180 tablet 3  . fexofenadine (ALLEGRA) 180 MG tablet Take 180 mg by mouth daily as needed for allergies.     . furosemide (LASIX) 40 MG tablet Take 1 tablet (40 mg total) by mouth daily as needed. TAKE 1 TABLET BY MOUTH  DAILY. MAY TAKE EXTRA 1/2  TABLET AS NEEDED FOR  SWELLING. 135 tablet 3  . levothyroxine (SYNTHROID, LEVOTHROID) 50 MCG tablet  Take 50 mcg by mouth daily before breakfast.     . loratadine (CLARITIN) 10 MG tablet Take 10 mg by mouth daily as needed for allergies.    . Magnesium 500 MG TABS Take 500 mg by mouth at bedtime.    [name] spironolactone (ALDACTONE) 25 MG tablet TAKE 1/2 TABLET BY MOUTH EVERY DAY 45 tablet 3   No current facility-administered medications for this visit.    Physical Exam: Vitals:   04/22/20 1056  BP: (!) 90/58  Pulse: 79  SpO2: 99%  Weight: 134 lb (60.8 kg)  Height: 5\' 3"  (1.6 m)    GEN- The patient is well appearing, alert and oriented x 3 today.   Head- normocephalic, atraumatic Eyes-  Sclera clear, conjunctiva pink Ears- hearing intact Oropharynx- clear Lungs-   normal work of breathing Chest- ICD pocket is well healed Heart- Regular rate and rhythm  GI- soft  Extremities- no clubbing, cyanosis, or edema  ICD interrogation- reviewed in detail today,  See PACEART report  ekg tracing ordered today is personally reviewed and shows AV paced rhythm  Wt Readings from Last 3 Encounters:  04/22/20 134 lb (60.8 kg)  01/29/20 133 lb 6.4 oz (60.5 kg)  10/09/19 132 lb 6 oz (60 kg)    Assessment and Plan:  1.  Chronic systolic dysfunction/ nonischemic euvolemic today Stable on an appropriate medical regimen Normal BiV ICD function See Pace Art report No changes today she is device dependant today followed in ICM device clinic I will refer to Oda Kilts for echo optimization of her BiV device.  Would also advise VV optimization by ekg at that time.  2. VT Well controlled with amiodarone 100mg  daily PVCs have improved,  Now 92.2% BiV paced Requires close follow-up on amiodarone to avoid toxicity with this medicine I do not see recent LFTs/TFTs.  I have advised that she have labs drawn.  She wishes to have these done by Dr Laurann Montana every 6 months.  3. Persistent afib 0% by ICD On eliquis chads2vasc score is 5  Risks, benefits and potential toxicities for medications  prescribed and/or refilled reviewed with patient today.   Echo optimization as above Return to see EP PA in a year  Thompson Grayer MD, [facility] 04/22/2020 11:20 AM

## 2020-04-23 ENCOUNTER — Telehealth: Payer: Self-pay | Admitting: Internal Medicine

## 2020-04-23 NOTE — Telephone Encounter (Signed)
Patient is requesting to discuss having procedure, discussed during appointment with Dr. Rayann Heman on 04/22/20. Per patient, the procedure would not be with Dr. Rayann Heman. However, patient is not sure what type of procedure was suggested. Please return call to further discuss.

## 2020-04-27 ENCOUNTER — Ambulatory Visit (INDEPENDENT_AMBULATORY_CARE_PROVIDER_SITE_OTHER): Payer: Medicare PPO

## 2020-04-27 DIAGNOSIS — I5022 Chronic systolic (congestive) heart failure: Secondary | ICD-10-CM

## 2020-04-27 DIAGNOSIS — Z95810 Presence of automatic (implantable) cardiac defibrillator: Secondary | ICD-10-CM

## 2020-05-01 NOTE — Progress Notes (Signed)
EPIC Encounter for ICM Monitoring  Patient Name: Kristi Parker is a 69 y.o. female Date: 05/01/2020 Primary Care Physican: [name], MD Primary Cardiologist:[name] Electrophysiologist: Allred Bi-V Pacing: 98.3% 9/17/2021Weight:131.8lbs    Spoke with patient and reports feeling well at this time.  Denies fluid symptoms.    Optivol thoracic impedance normal.   Prescribed: Furosemide40 mgtake 1 tablet by mouth daily. May take extra 1/2 tablet as needed for swelling.  03/23/20 Pt reports taking PRN instead of daily Spironolactone 25 mg take 1 tablet daily  Labs: 02/12/2020 Creatinine2.24, BUN18, Potassium4.0, [id number], LXB26-20 01/29/2020 Creatinine2.10, BUN23, Potassium4.1, Sodium138, BTD97-41 10/09/2019 Creatinine 1.82, BUN 13, Potassium 4.0, Sodium 143, GFR 28-33 06/05/2019 Creatinine 2.05, BUN 27, Potassium 4.0, Sodium 141, GFR 25-29 A complete set of results can be found in Results Review.  Recommendations:No changes and encouraged to call if experiencing any fluid symptoms.  Follow-up plan: ICM clinic phone appointment on 06/01/2020. 91 day device clinic remote transmission10/22/2021.  EP/Cardiology Office Visits:05/12/2020 with Oda Kilts, PA for echo optimization  Copy of ICM check sent to Dr.Allred.    3 month ICM trend: 04/27/2020    1 Year ICM trend:       [name], RN 05/01/2020 10:20 AM

## 2020-05-12 ENCOUNTER — Ambulatory Visit (HOSPITAL_COMMUNITY): Payer: Medicare PPO | Attending: Cardiology

## 2020-05-12 ENCOUNTER — Other Ambulatory Visit: Payer: Self-pay

## 2020-05-12 ENCOUNTER — Encounter: Payer: Self-pay | Admitting: Student

## 2020-05-12 ENCOUNTER — Ambulatory Visit: Payer: Medicare PPO | Admitting: Student

## 2020-05-12 VITALS — HR 70 | Ht 63.0 in | Wt 135.0 lb

## 2020-05-12 DIAGNOSIS — I472 Ventricular tachycardia, unspecified: Secondary | ICD-10-CM

## 2020-05-12 DIAGNOSIS — Z95810 Presence of automatic (implantable) cardiac defibrillator: Secondary | ICD-10-CM

## 2020-05-12 DIAGNOSIS — I5022 Chronic systolic (congestive) heart failure: Secondary | ICD-10-CM | POA: Diagnosis not present

## 2020-05-12 DIAGNOSIS — I428 Other cardiomyopathies: Secondary | ICD-10-CM | POA: Diagnosis not present

## 2020-05-12 LAB — ECHOCARDIOGRAM LIMITED
Area-P 1/2: 4.6 cm2
Height: 63 in
S' Lateral: 4.3 cm
Weight: 2160 oz

## 2020-05-12 NOTE — Progress Notes (Signed)
    Electrophysiology CRT AV optimization   Date: 05/12/2020  ID:  Kristi Parker, DOB [birth year], MRN [id number]  PCP: [name], MD Primary Cardiologist: Candee Furbish, MD Electrophysiologist: Thompson Grayer, MD   CC: Heart failure despite LV lead placement  Medtronic CRT implant 2009 for CHB with upgrade to ICD 07/2016 for chronic systolic dysfunction/NICM History of appropriate therapy: No History of AAD therapy: Yes, currently on amiodarone  LV lead History: Model: Medtronic 4196 Attain Ability Location: Appears posterior/lateral by CXR 07/2016 Threshold 2.75 V @ Vector LV tip to LV ring Revisions/CXR: Post op CXR 07/29/2016 stable Diaphragmatic Stim: Not noted VV timing Simultaneous Prior optimzation/changes:  04/13/2017 "Pt programmed to SAV of 150 ms and PAV of 170 ms after evaluation of Paced AV delays from 100 ms to 180 ms"  Echocardiogram: Pre-device implant: No pre-CRT echo available in system (original implant 2009) Post-device implant: (most recent) 03/18/2019 shows LVEF 25-30%  EKG: Pre-device implant: No pre-CRT EKG available in system (original implant 2009) Post-device implant: (most recent) 04/22/2020 shows A-V dual paced at 79 bpm with 168 ms QRS in RBBB pattern in V1 with negative lead 1 (demonstrating effective BiV pacing).   ICD interrogation: CRT pacing: 99.1% AF: 0% PVC burden: N/A% Thoracic impedence: Consistently hovers around baseline Activity Level: Appears 30-45 minutes a day, chronically HR excursion: Avg V rate 60-70s. See PaceArt report for full details.   Multi-site Pacing: Not applicable  EKG:  EKG is not ordered today. Consider VV optimization at later date based on response to AV optimization.   Assessment and Plan:  1.  Chronic systolic heart failure Patient reports continued SOB status post CRT implant Pt reports she is only SOB with inclines and stairs. She is able to ride a recumbent bike for 4 miles in 30 minutes, daily, as  well as other exercises. She will then go home and to her ADLs without fatigue or SOB.  Device interrogation today demonstrates stable programming.  Pt is 100% AP. Paced AV delay was adjusted from 110 ms to 200 ms, with minimal observable change in the separation of E and A waves. Pt has small A waves at baseline.   Disposition:   FU with EP-APP in 4 weeks for follow up and VV optimization consideration and consideration of barostim implantation with recent FDA clarification (CRT non-responders)  Signed, Shirley Friar, PA-C  05/12/2020 11:27 AM  [place] [address] [phone] (office) [phone] (fax

## 2020-06-01 ENCOUNTER — Ambulatory Visit (INDEPENDENT_AMBULATORY_CARE_PROVIDER_SITE_OTHER): Payer: Medicare PPO

## 2020-06-01 DIAGNOSIS — Z95810 Presence of automatic (implantable) cardiac defibrillator: Secondary | ICD-10-CM | POA: Diagnosis not present

## 2020-06-01 DIAGNOSIS — I5022 Chronic systolic (congestive) heart failure: Secondary | ICD-10-CM | POA: Diagnosis not present

## 2020-06-03 NOTE — Progress Notes (Signed)
EPIC Encounter for ICM Monitoring  Patient Name: Kristi Parker is a 69 y.o. female Date: 06/03/2020 Primary Care Physican: [name], MD Primary Cardiologist:[name] Electrophysiologist: Allred Bi-V Pacing: 95.1% 9/28/2021Office Weight:135lbs     Transmission reviewed.   Optivol thoracic impedance normal.   Prescribed: Furosemide40 mgtake 1 tablet by mouth daily. May take extra 1/2 tablet as needed for swelling. 03/23/20 Pt reports taking PRN instead of daily Spironolactone 25 mg take 1 tablet daily  Labs: 02/12/2020 Creatinine2.24, BUN18, Potassium4.0, [id number], YPE96-11 01/29/2020 Creatinine2.10, BUN23, Potassium4.1, Sodium138, EIH53-91 10/09/2019 Creatinine 1.82, BUN 13, Potassium 4.0, Sodium 143, GFR 28-33 06/05/2019 Creatinine 2.05, BUN 27, Potassium 4.0, Sodium 141, GFR 25-29 A complete set of results can be found in Results Review.  Recommendations:No changes.  Follow-up plan: ICM clinic phone appointment on 07/06/2020. 91 day device clinic remote transmission10/22/2021.  EP/Cardiology Office Visits:06/24/2020 with Oda Kilts, PA.  Copy of ICM check sent to Dr.Allred.    3 month ICM trend: 06/01/2020    1 Year ICM trend:       [name], RN 06/03/2020 11:12 AM

## 2020-06-05 ENCOUNTER — Ambulatory Visit (INDEPENDENT_AMBULATORY_CARE_PROVIDER_SITE_OTHER): Payer: Medicare PPO

## 2020-06-05 DIAGNOSIS — I472 Ventricular tachycardia, unspecified: Secondary | ICD-10-CM

## 2020-06-06 LAB — CUP PACEART REMOTE DEVICE CHECK
Battery Remaining Longevity: 21 mo
Battery Voltage: 2.93 V
Brady Statistic AP VP Percent: 96.94 %
Brady Statistic AP VS Percent: 3.06 %
Brady Statistic AS VP Percent: 0 %
Brady Statistic AS VS Percent: 0 %
Brady Statistic RA Percent Paced: 99.99 %
Brady Statistic RV Percent Paced: 93.42 %
Date Time Interrogation Session: 20211022022722
HighPow Impedance: 37 Ohm
Implantable Lead Implant Date: 20091104
Implantable Lead Implant Date: 20091104
Implantable Lead Implant Date: 20171214
Implantable Lead Location: 753858
Implantable Lead Location: 753859
Implantable Lead Location: 753860
Implantable Lead Model: 4196
Implantable Lead Model: 5076
Implantable Pulse Generator Implant Date: 20171214
Lead Channel Impedance Value: 1026 Ohm
Lead Channel Impedance Value: 361 Ohm
Lead Channel Impedance Value: 399 Ohm
Lead Channel Impedance Value: 456 Ohm
Lead Channel Impedance Value: 475 Ohm
Lead Channel Impedance Value: 646 Ohm
Lead Channel Pacing Threshold Amplitude: 0.5 V
Lead Channel Pacing Threshold Amplitude: 0.625 V
Lead Channel Pacing Threshold Amplitude: 3 V
Lead Channel Pacing Threshold Pulse Width: 0.4 ms
Lead Channel Pacing Threshold Pulse Width: 0.4 ms
Lead Channel Pacing Threshold Pulse Width: 0.6 ms
Lead Channel Sensing Intrinsic Amplitude: 0.125 mV
Lead Channel Sensing Intrinsic Amplitude: 0.125 mV
Lead Channel Sensing Intrinsic Amplitude: 7.125 mV
Lead Channel Sensing Intrinsic Amplitude: 7.125 mV
Lead Channel Setting Pacing Amplitude: 2 V
Lead Channel Setting Pacing Amplitude: 2.5 V
Lead Channel Setting Pacing Amplitude: 4 V
Lead Channel Setting Pacing Pulse Width: 0.4 ms
Lead Channel Setting Pacing Pulse Width: 0.6 ms
Lead Channel Setting Sensing Sensitivity: 0.3 mV

## 2020-06-10 NOTE — Progress Notes (Signed)
Remote ICD transmission.   

## 2020-06-11 ENCOUNTER — Encounter: Payer: Medicare PPO | Admitting: Student

## 2020-06-14 ENCOUNTER — Other Ambulatory Visit: Payer: Self-pay | Admitting: Cardiology

## 2020-06-18 ENCOUNTER — Other Ambulatory Visit (HOSPITAL_COMMUNITY): Payer: Self-pay | Admitting: *Deleted

## 2020-06-18 MED ORDER — CARVEDILOL 3.125 MG PO TABS: 3.1250 mg | ORAL_TABLET | Freq: Two times a day (BID) | ORAL | 3 refills | Status: DC

## 2020-06-19 ENCOUNTER — Other Ambulatory Visit (HOSPITAL_COMMUNITY): Payer: Self-pay | Admitting: *Deleted

## 2020-06-23 NOTE — Progress Notes (Signed)
Electrophysiology Office Note Date: 06/24/2020  ID:  Kristi, Parker 1950/09/04, MRN [id number]  PCP: [name], MD Primary Cardiologist: Candee Furbish, MD Electrophysiologist: Thompson Grayer, MD  CC: Routine ICD follow-up  Kristi Parker is a 69 y.o. female seen today for Thompson Grayer, MD for routine electrophysiology followup.  Since last being seen in our clinic the patient reports doing very well. She is exercising most days. She rides a bike for 4-4.5 miles in about 30-35 minutes. She denies SOB with this. She does light weights as well. She only have has mild SOB with long stairwells or hills, but this does not limit her.  she denies chest pain, palpitations, PND, orthopnea, nausea, vomiting, dizziness, syncope, edema, weight gain, or early satiety. She has not had ICD shocks.   Device History: Medtronic CRT implant 2009 for CHB with upgrade to ICD 07/2016 for chronic systolic dysfunction/NICM History of appropriate therapy: No History of AAD therapy: Yes, currently on amiodarone  Past Medical History:  Diagnosis Date  . Adenomatous colon polyp   . Cervical radiculopathy at C5   . Chronic systolic heart failure ([place])   . CKD (chronic kidney disease), stage III ([name])   . DDD (degenerative disc disease), cervical   . DDD (degenerative disc disease), lumbar   . Fibroid   . GERD (gastroesophageal reflux disease)   . Hearing loss 02/2010   L hearing loss/vertigo, steroids  . History of seizure disorder    Related Hx  . Mitral regurgitation    moderate  . Multiple sclerosis ([name]) 1984  . Nonischemic cardiomyopathy ([facility])    Moderate LVEF 35-40% by ECHO 2011, 25-30% by echo 2013  . Permanent atrial fibrillation ([name])   . Pulmonary hypertension, secondary 06/04/2013   As a result of nonischemic cardiomyopathy EF 25-30%  . Stroke [facility])    Right brain CVA, complete recovery 07/2003  . Ventricular tachycardia ([place])   . Vertigo 02/2010   L hearing loss/vertigo,  steroids   Past Surgical History:  Procedure Laterality Date  . BIV PACEMAKER GENERATOR CHANGE OUT N/A 09/18/2014   Procedure: BIV PACEMAKER GENERATOR CHANGE OUT;  Surgeon: Thompson Grayer, MD;  Location: [facility] CATH LAB;  Service: Cardiovascular;  Laterality: N/A;  . DILATION AND CURETTAGE OF UTERUS    . EP IMPLANTABLE DEVICE N/A 07/28/2016   Procedure: ICD Implant;  Surgeon: Evans Lance, MD;  Location: [place] CV LAB;  Service: Cardiovascular;  Laterality: N/A;  . HYSTEROSCOPY    . PACEMAKER INSERTION     PTVP 08/2001 for complete heart block. Upgrade PTVP to MDT BiV 06/2008 by Dr Leonia Reeves  . RIGHT HEART CATH N/A 05/17/2018   Procedure: RIGHT HEART CATH;  Surgeon: Jolaine Artist, MD;  Location: [place] CV LAB;  Service: Cardiovascular;  Laterality: N/A;  . RIGHT/LEFT HEART CATH AND CORONARY ANGIOGRAPHY N/A 02/02/2018   Procedure: RIGHT/LEFT HEART CATH AND CORONARY ANGIOGRAPHY;  Surgeon: Jolaine Artist, MD;  Location: [facility] CV LAB;  Service: Cardiovascular;  Laterality: N/A;  . TUBAL LIGATION      Current Outpatient Medications  Medication Sig Dispense Refill  . acetaminophen (TYLENOL) 500 MG tablet Take 500-1,000 mg by mouth every 6 (six) hours as needed (for back pain).     . ALPHAGAN P 0.1 % SOLN Place 1 drop into both eyes 2 (two) times daily.     Kristi Parker amiodarone (PACERONE) 200 MG tablet Take 0.5 tablets (100 mg total) by mouth daily. 135 tablet 2  .  apixaban (ELIQUIS) 2.5 MG TABS tablet Take 1 tablet (2.5 mg total) by mouth 2 (two) times daily. 180 tablet 2  . Biotin 2500 MCG CAPS Take 2,500 mcg by mouth at bedtime.    . Calcium Carbonate-Vitamin D (CALCIUM + D PO) Take 1 tablet by mouth every evening.     . carvedilol (COREG) 3.125 MG tablet Take 1 tablet (3.125 mg total) by mouth 2 (two) times daily. 90 tablet 3  . dapagliflozin propanediol (FARXIGA) 10 MG TABS tablet Take 1 tablet (10 mg total) by mouth daily before breakfast. 30 tablet 6  . ENTRESTO 49-51 MG TAKE 1  TABLET BY MOUTH TWICE A DAY 180 tablet 3  . fexofenadine (ALLEGRA) 180 MG tablet Take 180 mg by mouth daily as needed for allergies.     . furosemide (LASIX) 40 MG tablet Take 1 tablet (40 mg total) by mouth daily as needed. TAKE 1 TABLET BY MOUTH  DAILY. MAY TAKE EXTRA 1/2  TABLET AS NEEDED FOR  SWELLING. 135 tablet 3  . levothyroxine (SYNTHROID, LEVOTHROID) 50 MCG tablet Take 50 mcg by mouth daily before breakfast.     . loratadine (CLARITIN) 10 MG tablet Take 10 mg by mouth daily as needed for allergies.    . Magnesium 500 MG TABS Take 500 mg by mouth at bedtime.    Kristi Parker spironolactone (ALDACTONE) 25 MG tablet TAKE 1/2 TABLET BY MOUTH EVERY DAY 45 tablet 3   No current facility-administered medications for this visit.    Allergies:   Patient has no known allergies.   Social History: Social History   Socioeconomic History  . Marital status: Married    Spouse name: Not on file  . Number of children: Not on file  . Years of education: Not on file  . Highest education level: Not on file  Occupational History  . Not on file  Tobacco Use  . Smoking status: Never Smoker  . Smokeless tobacco: Never Used  Vaping Use  . Vaping Use: Never used  Substance and Sexual Activity  . Alcohol use: Yes    Comment: rare  . Drug use: No  . Sexual activity: Yes    Birth control/protection: Post-menopausal  Other Topics Concern  . Not on file  Social History Narrative  . Not on file   Social Determinants of Health   Financial Resource Strain:   . Difficulty of Paying Living Expenses: Not on file  Food Insecurity:   . Worried About Charity fundraiser in the Last Year: Not on file  . Ran Out of Food in the Last Year: Not on file  Transportation Needs:   . Lack of Transportation (Medical): Not on file  . Lack of Transportation (Non-Medical): Not on file  Physical Activity:   . Days of Exercise per Week: Not on file  . Minutes of Exercise per Session: Not on file  Stress:   . Feeling of  Stress : Not on file  Social Connections:   . Frequency of Communication with Friends and Family: Not on file  . Frequency of Social Gatherings with Friends and Family: Not on file  . Attends Religious Services: Not on file  . Active Member of Clubs or Organizations: Not on file  . Attends Archivist Meetings: Not on file  . Marital Status: Not on file  Intimate Partner Violence:   . Fear of Current or Ex-Partner: Not on file  . Emotionally Abused: Not on file  . Physically Abused: Not on  file  . Sexually Abused: Not on file    Family History: Family History  Problem Relation Age of Onset  . Diabetes Mother   . Multiple sclerosis Mother   . Cancer Father        ?  . CAD Other        Father's side CAD    Review of Systems: All other systems reviewed and are otherwise negative except as noted above.   Physical Exam: Vitals:   06/24/20 0921  BP: 102/70  Pulse: 76  SpO2: 97%  Weight: 135 lb (61.2 kg)  Height: 5\' 3"  (1.6 m)     GEN- The patient is well appearing, alert and oriented x 3 today.   HEENT: normocephalic, atraumatic; sclera clear, conjunctiva pink; hearing intact; oropharynx clear; neck supple, no JVP Lymph- no cervical lymphadenopathy Lungs- Clear to ausculation bilaterally, normal work of breathing.  No wheezes, rales, rhonchi Heart- Regular rate and rhythm, no murmurs, rubs or gallops, PMI not laterally displaced GI- soft, non-tender, non-distended, bowel sounds present, no hepatosplenomegaly Extremities- no clubbing or cyanosis. No edema; DP/PT/radial pulses 2+ bilaterally MS- no significant deformity or atrophy Skin- warm and dry, no rash or lesion; ICD pocket well healed Psych- euthymic mood, full affect Neuro- strength and sensation are intact  ICD interrogation- reviewed in detail today,  See PACEART report  EKG:  EKG is ordered today. The ekg ordered today shows: Baseline: AP/VP/CRT at 61 bpm, PR interval 176 ms, QRS 162 ms; RBBB  pattern, upright in V1, downward in lead 1; LV/RV simultaneous EKG #2: AP/VP/CRT at 60 bpm, PR interval 176 ms, QRS 178 ms; RBBB pattern, upright in V1, downward in lead 1; LV -> RV 20 ms EKG #3: AP/VP/CRT at 60 bpm, PR interval 176 ms, QRS 208 ms, RBBB pattern, upright in V1, downward in lead 1; LV -> RV 50 ms  LV ring to LV tip had threshold > 4.0 V Pt had "thumping" with LV tip to RV coil.    No options for V-V optimization  Recent Labs: 01/29/2020: B Natriuretic Peptide 147.4 02/12/2020: BUN 18; Creatinine, Ser 2.24; Potassium 4.0; Sodium 141   Wt Readings from Last 3 Encounters:  06/24/20 135 lb (61.2 kg)  05/12/20 135 lb (61.2 kg)  04/22/20 134 lb (60.8 kg)     Other studies Reviewed: Additional studies/ records that were reviewed today include: Previous EP office notes. Recent AV opt notes.   Assessment and Plan:  1.  Chronic systolic dysfunction s/p Medtronic CRT-D  euvolemic today NYHA II symptoms currently. Stable on an appropriate medical regimen Normal ICD function See Pace Art report No changes today She is able to ride a stationary bike for > 30 minutes and > 4 miles without any SOB.  We briefly discussed Hotel manager and she was given information packet. She would possible be candidate in the future if she had worsening HF symptoms despite her GDMT and CRT.   2. Ventricular tachycardia 90.34% BiV paced Continue amiodarone.   3. Persistent AF 0% burden by device Continue eliquis for CHA2DS2VASC of at least 5.        Current medicines are reviewed at length with the patient today.   The patient does not have concerns regarding her medicines.  The following changes were made today:  none  Labs/ tests ordered today include:  Orders Placed This Encounter  Procedures  . EKG 12-Lead     Disposition:   Follow up with Dr Rayann Heman or EP APP in  6 months with CRT device.    Jacalyn Lefevre, PA-C  06/24/2020 9:50 AM  [facility] [address] [place] [address] [phone] (office) [phone] (fax)

## 2020-06-24 ENCOUNTER — Ambulatory Visit: Payer: Medicare PPO | Admitting: Student

## 2020-06-24 ENCOUNTER — Other Ambulatory Visit: Payer: Self-pay

## 2020-06-24 ENCOUNTER — Encounter: Payer: Self-pay | Admitting: Student

## 2020-06-24 VITALS — BP 102/70 | HR 76 | Ht 63.0 in | Wt 135.0 lb

## 2020-06-24 DIAGNOSIS — I5022 Chronic systolic (congestive) heart failure: Secondary | ICD-10-CM | POA: Diagnosis not present

## 2020-06-24 DIAGNOSIS — Z95810 Presence of automatic (implantable) cardiac defibrillator: Secondary | ICD-10-CM

## 2020-06-24 DIAGNOSIS — I428 Other cardiomyopathies: Secondary | ICD-10-CM | POA: Diagnosis not present

## 2020-06-24 DIAGNOSIS — I480 Paroxysmal atrial fibrillation: Secondary | ICD-10-CM | POA: Diagnosis not present

## 2020-06-24 LAB — CUP PACEART INCLINIC DEVICE CHECK
Battery Remaining Longevity: 20 mo
Battery Voltage: 2.93 V
Brady Statistic AP VP Percent: 97.91 %
Brady Statistic AP VS Percent: 2.09 %
Brady Statistic AS VP Percent: 0 %
Brady Statistic AS VS Percent: 0 %
Brady Statistic RA Percent Paced: 99.99 %
Brady Statistic RV Percent Paced: 95.07 %
Date Time Interrogation Session: 20211110095456
HighPow Impedance: 42 Ohm
Implantable Lead Implant Date: 20091104
Implantable Lead Implant Date: 20091104
Implantable Lead Implant Date: 20171214
Implantable Lead Location: 753858
Implantable Lead Location: 753859
Implantable Lead Location: 753860
Implantable Lead Model: 4196
Implantable Lead Model: 5076
Implantable Pulse Generator Implant Date: 20171214
Lead Channel Impedance Value: 342 Ohm
Lead Channel Impedance Value: 399 Ohm
Lead Channel Impedance Value: 456 Ohm
Lead Channel Impedance Value: 456 Ohm
Lead Channel Impedance Value: 608 Ohm
Lead Channel Impedance Value: 988 Ohm
Lead Channel Pacing Threshold Amplitude: 0.5 V
Lead Channel Pacing Threshold Amplitude: 0.625 V
Lead Channel Pacing Threshold Amplitude: 2.375 V
Lead Channel Pacing Threshold Pulse Width: 0.4 ms
Lead Channel Pacing Threshold Pulse Width: 0.4 ms
Lead Channel Pacing Threshold Pulse Width: 0.6 ms
Lead Channel Sensing Intrinsic Amplitude: 0.125 mV
Lead Channel Sensing Intrinsic Amplitude: 0.125 mV
Lead Channel Sensing Intrinsic Amplitude: 6.875 mV
Lead Channel Sensing Intrinsic Amplitude: 6.875 mV
Lead Channel Setting Pacing Amplitude: 2 V
Lead Channel Setting Pacing Amplitude: 2.5 V
Lead Channel Setting Pacing Amplitude: 4 V
Lead Channel Setting Pacing Pulse Width: 0.4 ms
Lead Channel Setting Pacing Pulse Width: 0.6 ms
Lead Channel Setting Sensing Sensitivity: 0.3 mV

## 2020-06-24 NOTE — Patient Instructions (Addendum)
Medication Instructions:  *If you need a refill on your cardiac medications before your next appointment, please call your pharmacy*  Follow-Up: At [facility], you and your health needs are our priority.  As part of our continuing mission to provide you with exceptional heart care, we have created designated Provider Care Teams.  These Care Teams include your primary Cardiologist (physician) and Advanced Practice Providers (APPs -  Physician Assistants and Nurse Practitioners) who all work together to provide you with the care you need, when you need it.  We recommend signing up for the patient portal called "MyChart".  Sign up information is provided on this After Visit Summary.  MyChart is used to connect with patients for Virtual Visits (Telemedicine).  Patients are able to view lab/test results, encounter notes, upcoming appointments, etc.  Non-urgent messages can be sent to your provider as well.   To learn more about what you can do with MyChart, go to [email or web address].    Your next appointment:   Your physician recommends that you schedule a follow-up appointment in: 6 MONTHS with Dr. Rayann Heman -- 12/28/20   The format for your next appointment:   In Person with Thompson Grayer, MD

## 2020-07-06 ENCOUNTER — Ambulatory Visit (INDEPENDENT_AMBULATORY_CARE_PROVIDER_SITE_OTHER): Payer: Medicare PPO

## 2020-07-06 DIAGNOSIS — I5022 Chronic systolic (congestive) heart failure: Secondary | ICD-10-CM

## 2020-07-06 DIAGNOSIS — Z95810 Presence of automatic (implantable) cardiac defibrillator: Secondary | ICD-10-CM | POA: Diagnosis not present

## 2020-07-07 NOTE — Progress Notes (Signed)
EPIC Encounter for ICM Monitoring  Patient Name: Kristi Parker is a 69 y.o. female Date: 07/07/2020 Primary Care Physican: [name], MD Primary Cardiologist:[name] Electrophysiologist: Allred Bi-V Pacing: 99.2% 9/28/2021Office Weight:135lbs     Spoke with patient and reports feeling well at this time.  Denies fluid symptoms.    Optivol thoracic impedance normal.   Prescribed: Furosemide40 mgtake 1 tablet by mouth daily. May take extra 1/2 tablet as needed for swelling. 03/23/20 Pt reports taking PRN instead of daily Spironolactone 25 mg take 1 tablet daily  Labs: 02/12/2020 Creatinine2.24, BUN18, Potassium4.0, [id number], [id number] 01/29/2020 Creatinine2.10, BUN23, Potassium4.1, Sodium138, GXI71-29 10/09/2019 Creatinine 1.82, BUN 13, Potassium 4.0, Sodium 143, GFR 28-33 06/05/2019 Creatinine 2.05, BUN 27, Potassium 4.0, Sodium 141, GFR 25-29 A complete set of results can be found in Results Review.  Recommendations: No changes and encouraged to call if experiencing any fluid symptoms.  Follow-up plan: ICM clinic phone appointment on 08/10/2020. 91 day device clinic remote transmission1/21/2022.  EP/Cardiology Office Visits: 12/28/2020 with Dr Rayann Heman.  Pt was due follow up with Dr Haroldine Laws 05/2020.  Last visit with Dr Marlou Porch 12/07/2018 (virtual).  Copy of ICM check sent to Dr.Allred.   3 month ICM trend: 07/06/2020    1 Year ICM trend:       [name], RN 07/07/2020 3:08 PM

## 2020-08-10 ENCOUNTER — Ambulatory Visit (INDEPENDENT_AMBULATORY_CARE_PROVIDER_SITE_OTHER): Payer: Medicare PPO

## 2020-08-10 DIAGNOSIS — Z95810 Presence of automatic (implantable) cardiac defibrillator: Secondary | ICD-10-CM

## 2020-08-10 DIAGNOSIS — I5022 Chronic systolic (congestive) heart failure: Secondary | ICD-10-CM

## 2020-08-11 ENCOUNTER — Other Ambulatory Visit (HOSPITAL_COMMUNITY): Payer: Self-pay | Admitting: Internal Medicine

## 2020-08-12 NOTE — Progress Notes (Signed)
EPIC Encounter for ICM Monitoring  Patient Name: Kristi Parker is a 69 y.o. female Date: 08/12/2020 Primary Care Physican: [name], MD Primary Cardiologist:[name] Electrophysiologist: Allred Bi-V Pacing: 99.2% 12/29/2021Weight:130-134lbs     Spoke with patient and reports feeling well at this time.  Denies fluid symptoms.    Optivol thoracic impedance normal.   Prescribed: Furosemide40 mgtake 1 tablet by mouth daily. May take extra 1/2 tablet as needed for swelling. 03/23/20 Pt reports taking PRN instead of daily Spironolactone 25 mg take 1 tablet daily  Labs: 02/12/2020 Creatinine2.24, BUN18, Potassium4.0, [id number], [id number] 01/29/2020 Creatinine2.10, BUN23, Potassium4.1, Sodium138, [id number] 10/09/2019 Creatinine 1.82, BUN 13, Potassium 4.0, Sodium 143, GFR 28-33 06/05/2019 Creatinine 2.05, BUN 27, Potassium 4.0, Sodium 141, GFR 25-29 A complete set of results can be found in Results Review.  Recommendations:No changes and encouraged to call if experiencing any fluid symptoms.  Follow-up plan: ICM clinic phone appointment on 09/21/2020. 91 day device clinic remote transmission1/21/2022.  EP/Cardiology Office Visits: 12/28/2020 with Dr Rayann Heman.  Pt was due follow up with Dr Haroldine Laws 05/2020.  Last visit with Dr Marlou Porch 12/07/2018 (virtual).  Copy of ICM check sent to Dr.Allred.   3 month ICM trend: 08/10/2020    1 Year ICM trend:       [name], RN 08/12/2020 1:47 PM

## 2020-08-17 ENCOUNTER — Other Ambulatory Visit (HOSPITAL_COMMUNITY): Payer: Self-pay | Admitting: Internal Medicine

## 2020-09-04 ENCOUNTER — Ambulatory Visit (INDEPENDENT_AMBULATORY_CARE_PROVIDER_SITE_OTHER): Payer: Medicare PPO

## 2020-09-04 DIAGNOSIS — I5022 Chronic systolic (congestive) heart failure: Secondary | ICD-10-CM | POA: Diagnosis not present

## 2020-09-04 DIAGNOSIS — I480 Paroxysmal atrial fibrillation: Secondary | ICD-10-CM

## 2020-09-04 LAB — CUP PACEART REMOTE DEVICE CHECK
Battery Remaining Longevity: 19 mo
Battery Voltage: 2.93 V
Brady Statistic AP VP Percent: 99.88 %
Brady Statistic AP VS Percent: 0.12 %
Brady Statistic AS VP Percent: 0 %
Brady Statistic AS VS Percent: 0 %
Brady Statistic RA Percent Paced: 99.99 %
Brady Statistic RV Percent Paced: 99.43 %
Date Time Interrogation Session: 20220121001703
HighPow Impedance: 43 Ohm
Implantable Lead Implant Date: 20091104
Implantable Lead Implant Date: 20091104
Implantable Lead Implant Date: 20171214
Implantable Lead Location: 753858
Implantable Lead Location: 753859
Implantable Lead Location: 753860
Implantable Lead Model: 4196
Implantable Lead Model: 5076
Implantable Pulse Generator Implant Date: 20171214
Lead Channel Impedance Value: 1007 Ohm
Lead Channel Impedance Value: 399 Ohm
Lead Channel Impedance Value: 418 Ohm
Lead Channel Impedance Value: 513 Ohm
Lead Channel Impedance Value: 513 Ohm
Lead Channel Impedance Value: 608 Ohm
Lead Channel Pacing Threshold Amplitude: 0.5 V
Lead Channel Pacing Threshold Amplitude: 0.625 V
Lead Channel Pacing Threshold Amplitude: 2.75 V
Lead Channel Pacing Threshold Pulse Width: 0.4 ms
Lead Channel Pacing Threshold Pulse Width: 0.4 ms
Lead Channel Pacing Threshold Pulse Width: 0.6 ms
Lead Channel Sensing Intrinsic Amplitude: 0.125 mV
Lead Channel Sensing Intrinsic Amplitude: 0.125 mV
Lead Channel Sensing Intrinsic Amplitude: 7 mV
Lead Channel Sensing Intrinsic Amplitude: 7 mV
Lead Channel Setting Pacing Amplitude: 2 V
Lead Channel Setting Pacing Amplitude: 2.5 V
Lead Channel Setting Pacing Amplitude: 4.25 V
Lead Channel Setting Pacing Pulse Width: 0.4 ms
Lead Channel Setting Pacing Pulse Width: 0.6 ms
Lead Channel Setting Sensing Sensitivity: 0.3 mV

## 2020-09-05 DIAGNOSIS — Z20822 Contact with and (suspected) exposure to covid-19: Secondary | ICD-10-CM | POA: Diagnosis not present

## 2020-09-09 ENCOUNTER — Other Ambulatory Visit (HOSPITAL_COMMUNITY): Payer: Self-pay | Admitting: Internal Medicine

## 2020-09-16 NOTE — Progress Notes (Signed)
Remote ICD transmission.   

## 2020-09-21 ENCOUNTER — Ambulatory Visit (INDEPENDENT_AMBULATORY_CARE_PROVIDER_SITE_OTHER): Payer: Medicare PPO

## 2020-09-21 DIAGNOSIS — Z95810 Presence of automatic (implantable) cardiac defibrillator: Secondary | ICD-10-CM

## 2020-09-21 DIAGNOSIS — I5022 Chronic systolic (congestive) heart failure: Secondary | ICD-10-CM

## 2020-09-21 DIAGNOSIS — Z950 Presence of cardiac pacemaker: Secondary | ICD-10-CM

## 2020-09-25 NOTE — Progress Notes (Signed)
EPIC Encounter for ICM Monitoring  Patient Name: Kristi Parker is a 70 y.o. female Date: 09/25/2020 Primary Care Physican: [name], MD Primary Cardiologist:[name] Electrophysiologist: Allred Bi-V Pacing: 99.7% 12/29/2021Weight:130-134lbs     Transmission reviewed.  Optivol thoracic impedance normal.   Prescribed: Furosemide40 mgtake 1 tablet by mouth daily. May take extra 1/2 tablet as needed for swelling. 03/23/20 Pt reports taking PRN instead of daily Spironolactone 25 mg take 1 tablet daily  Labs: 02/12/2020 Creatinine2.24, BUN18, Potassium4.0, [id number], [id number] 01/29/2020 Creatinine2.10, BUN23, Potassium4.1, Sodium138, [id number] 10/09/2019 Creatinine 1.82, BUN 13, Potassium 4.0, Sodium 143, GFR 28-33 06/05/2019 Creatinine 2.05, BUN 27, Potassium 4.0, Sodium 141, GFR 25-29 A complete set of results can be found in Results Review.  Recommendations:No changes.  Follow-up plan: ICM clinic phone appointment on3/14/2022. 91 day device clinic remote transmission4/22/2022.  EP/Cardiology Office Visits: 12/28/2020 with Dr Rayann Heman.Pt was due follow up with Dr Haroldine Laws 05/2020. Last visit with Dr Marlou Porch 12/07/2018 (virtual).  Copy of ICM check sent to Dr.Allred.   3 month ICM trend: 09/21/2020.    1 Year ICM trend:       Kristi Billings, RN 09/25/2020 2:59 PM

## 2020-10-13 NOTE — Progress Notes (Signed)
Advance Heart Failure Clinic Note   PCP:  [name], MD  Cardiologist:  Candee Furbish, MD Primary HF: Bensimhon  Chief Complaint: Heart Failure follow-up   History of Present Illness:  Kristi Parker is a 70 y.o. female with history of VT, severe systolic HF due to NICM (EF 20-25%) s/p Medtronic BiV ICD, CVA 2004, PAF, and CKD III-IV.   She was first diagnosed with HF in 2013. She is unsure of the cause. She had a heart cath and did not have any blockages. We also follow her son in HF clinic for NICM. She states that multiple family members have severe HF and several have underwent transplant.  Based on CPX testing, we referred her to Dr. Mosetta Pigeon at [facility]. She was seen there on 02/22/18 and they discussed need for advanced therapies including possible heart or heart-kidney transplant. She had f/u with them subsequently and felt not to be candidate for heart-kidney transplant but might be candidate for high-risk VAD. We reviewed case with [facility] and she was also turned down for heart/kidney transplant due to age.  Had repeat CPX in 10/19 with low Vo2 (13.5) and high slope (42). Repeat RHC done on 10//19 as below as part of transplant work up at [facility] showed elevated volume with CI 2.7  Today she returns for HF follow up. Last seen in clinic 6/21, Wilder Glade started, and had on-going discussion about VAD. Overall feeling fine. Denies increasing SOB, CP, dizziness, edema, or PND/Orthopnea. Appetite ok. No fever or chills. Weight at home ~125-128 pounds. Taking all medications. Goes to [employer] daily, does arm/leg presses, rides bike 35-50 minutes (~4-6 miles). Has not needed lasix recently. Does not check BP at home.   Cardiac Studies: Echo (9/21): EF 25-30%, severe LV dysfunction, RV ok. Echo (8/20): EF 25% RV normal (Personally reviewed). CPX (10/19): low Vo2 (13.5), high slope (42).  RHC (10/19):  Findings:  RA = 12 RV = 53/12 PA = 51/22 (34) PCW = mean 28 (v = 35-40) Fick cardiac  output/index = 4.4/2.7 PVR = 1.4 WU FA sat = 98% PA sat = 62%, 63% PAPi = 2.4 RA/PCWP = 0.42 Assessment: 1. NICM with elevated filling pressures and relatively preserved output. 2. Prominent v-waves in PCWP tracing suggestive of significant MR Plan/Discussion: Increase diuretics. Continue transplant w/u based on CPX results.   Past Medical History:  Diagnosis Date  . Adenomatous colon polyp   . Cervical radiculopathy at C5   . Chronic systolic heart failure ([place])   . CKD (chronic kidney disease), stage III ([place])   . DDD (degenerative disc disease), cervical   . DDD (degenerative disc disease), lumbar   . Fibroid   . GERD (gastroesophageal reflux disease)   . Hearing loss 02/2010   L hearing loss/vertigo, steroids  . History of seizure disorder    Related Hx  . Mitral regurgitation    moderate  . Multiple sclerosis ([place]) 1984  . Nonischemic cardiomyopathy ([facility])    Moderate LVEF 35-40% by ECHO 2011, 25-30% by echo 2013  . Permanent atrial fibrillation ([place])   . Pulmonary hypertension, secondary 06/04/2013   As a result of nonischemic cardiomyopathy EF 25-30%  . Stroke [facility])    Right brain CVA, complete recovery 07/2003  . Ventricular tachycardia ([place])   . Vertigo 02/2010   L hearing loss/vertigo, steroids   Past Surgical History:  Procedure Laterality Date  . BIV PACEMAKER GENERATOR CHANGE OUT N/A 09/18/2014   Procedure: BIV PACEMAKER GENERATOR CHANGE OUT;  Surgeon: Thompson Grayer, MD;  Location: [facility] CATH LAB;  Service: Cardiovascular;  Laterality: N/A;  . DILATION AND CURETTAGE OF UTERUS    . EP IMPLANTABLE DEVICE N/A 07/28/2016   Procedure: ICD Implant;  Surgeon: Evans Lance, MD;  Location: [place] CV LAB;  Service: Cardiovascular;  Laterality: N/A;  . HYSTEROSCOPY    . PACEMAKER INSERTION     PTVP 08/2001 for complete heart block. Upgrade PTVP to MDT BiV 06/2008 by Dr Leonia Reeves  . RIGHT HEART CATH N/A 05/17/2018   Procedure: RIGHT HEART CATH;  Surgeon: Jolaine Artist, MD;  Location: [place] CV LAB;  Service: Cardiovascular;  Laterality: N/A;  . RIGHT/LEFT HEART CATH AND CORONARY ANGIOGRAPHY N/A 02/02/2018   Procedure: RIGHT/LEFT HEART CATH AND CORONARY ANGIOGRAPHY;  Surgeon: Jolaine Artist, MD;  Location: [place] CV LAB;  Service: Cardiovascular;  Laterality: N/A;  . TUBAL LIGATION      Current Outpatient Medications  Medication Sig Dispense Refill  . acetaminophen (TYLENOL) 500 MG tablet Take 500-1,000 mg by mouth every 6 (six) hours as needed (for back pain).     . ALPHAGAN P 0.1 % SOLN Place 1 drop into both eyes 2 (two) times daily.     [name] amiodarone (PACERONE) 200 MG tablet Take 0.5 tablets (100 mg total) by mouth daily. 135 tablet 2  . apixaban (ELIQUIS) 2.5 MG TABS tablet Take 1 tablet (2.5 mg total) by mouth 2 (two) times daily. Needs appointment for further refills. 180 tablet 0  . Biotin 2500 MCG CAPS Take 2,500 mcg by mouth at bedtime.    . Calcium Carbonate-Vitamin D (CALCIUM + D PO) Take 1 tablet by mouth every evening.    . carvedilol (COREG) 3.125 MG tablet Take 1 tablet (3.125 mg total) by mouth 2 (two) times daily. 90 tablet 3  . ENTRESTO 49-51 MG TAKE 1 TABLET BY MOUTH TWICE A DAY 180 tablet 3  . FARXIGA 10 MG TABS tablet TAKE 1 TABLET (10 MG TOTAL) BY MOUTH DAILY BEFORE BREAKFAST. NEEDS APPOINTMENT FOR FURTHER REFILLS. 30 tablet 0  . fexofenadine (ALLEGRA) 180 MG tablet Take 180 mg by mouth daily as needed for allergies.     . furosemide (LASIX) 40 MG tablet Take 1 tablet (40 mg total) by mouth daily as needed. TAKE 1 TABLET BY MOUTH  DAILY. MAY TAKE EXTRA 1/2  TABLET AS NEEDED FOR  SWELLING. 135 tablet 3  . levothyroxine (SYNTHROID, LEVOTHROID) 50 MCG tablet Take 50 mcg by mouth daily before breakfast.     . loratadine (CLARITIN) 10 MG tablet Take 10 mg by mouth daily as needed for allergies.    . Magnesium 500 MG TABS Take 500 mg by mouth at bedtime.    [name] spironolactone (ALDACTONE) 25 MG tablet TAKE 1/2 TABLET BY  MOUTH EVERY DAY 45 tablet 3   No current facility-administered medications for this encounter.    Allergies:   Patient has no known allergies.   Social History:  The patient  reports that she has never smoked. She has never used smokeless tobacco. She reports current alcohol use. She reports that she does not use drugs.   Family History:  The patient's family history includes CAD in an other family member; Cancer in her father; Diabetes in her mother; Multiple sclerosis in her mother.   ROS:  Please see the history of present illness.   All other systems are personally reviewed and negative.   Vitals:   10/14/20 1345  BP: 115/79  Pulse: 79  SpO2: 100%  Weight: 59.8 kg (131 lb 12.8 oz)   Wt Readings from Last 3 Encounters:  10/14/20 59.8 kg (131 lb 12.8 oz)  06/24/20 61.2 kg (135 lb)  05/12/20 61.2 kg (135 lb)   Exam:  General:  NAD. No resp difficulty HEENT: Normal Neck: Supple. No JVD. Carotids 2+ bilat; no bruits. No lymphadenopathy or thryomegaly appreciated. Cor: PMI nondisplaced. Regular rate & rhythm. No rubs, gallops , +MR Lungs: Clear Abdomen: Soft, nontender, nondistended. No hepatosplenomegaly. No bruits or masses. Good bowel sounds. Extremities: No cyanosis, clubbing, rash, edema Neuro: alert & oriented x 3, cranial nerves grossly intact. Moves all 4 extremities w/o difficulty. Affect pleasant.  Recent Labs: 01/29/2020: B Natriuretic Peptide 147.4 02/12/2020: BUN 18; Creatinine, Ser 2.24; Potassium 4.0; Sodium 141  Personally reviewed    ICD interrogation: OptiVol index below threshold, thoracic impedence stable, >99% a-pacing, no VT/VF, ~1 hr day/activity (personally reviewed).  ECG: AV pacing, 60 bpm, qrs 174 ms (personally reviewed). No significant change from previous tracing.  ASSESSMENT AND PLAN:  1. Chronic Systolic Heart Failure due to NICM. S/p Medtronic BiV ICD. Likely familial CM.  - Echo (9/21): EF 25-30%, severe LV dysfunction, RV ok. - Echo  (8/20): EF 25% RV normal Personally reviewed - Echo (12/14/17): EF 20-25%, trivial AI, mild to mod MR, mildly reduced RV, severely dilated RA and LA, PA pressures moderately to severely increased.  - CPX (05/16/18): pVO2 12.1, slope 48 - RHC (05/17/18): NICM with elevated filling pressures and relatively preserved output, suggestive of significant MR - LHC (02/02/18) with normal coronaries. RHC showed severe NICM with well compensated hemodynamics. - CPX (12/27/17): Peak VO2 13.5, slope 42 - Stable NYHA II, volume good on exam and ICD interrogation. She does not need daily lasix. - Continue carvedilol 3.125 mg bid. - Continue Entresto 49/51 mg bid. - Continue spiro 12.5 mg daily.  - Continue Farxiga 10 mg daily. - Blood type AB+. She has been seen by Dr. Mosetta Pigeon at [facility] and felt not to be candidate for heart-kidney transplant, but might be candidate for high-risk VAD. [facility] declined seeing her for heart-kidney transplant due to age.   - Last discussed VAD on 6/21 (versus repeating CPX) but says she appears stable and wants to hold off for now. Although she reports NYHA II symptoms, CPX and activity level on ICD suggest worse functional capacity. If getting worse may have one last chance to consider high-risk VAD.  - ? Barostim candidate if worsening symptoms and declines VAD. - CMET today  2. PAF - Remains in NSR.  - CHA2DS2VASC of at least 5. - Continue amiodarone 100 mg daily. - Continue Eliquis 2.5 mg bid. - No bleeding issues. - She needs regular eye exams. - Check CMET & TSH.  3. CKD IV - Baseline SCr ~ 1.7-1.9. - CMET today.  4. MR - Mild on most recent echo (9/21). No change.  5. Hx of VT s/p Medtronic BiV ICD - ICD interrogated personally today. No VT.  - Continue amiodarone 100 mg day.  Follow back with Dr. Haroldine Laws in 3-4 months, sooner if HF symptoms worsen.  Rafael Bihari, FNP -[facility] 10/14/2020 1:58 PM  Advanced Heart Failure [place] [address] Heart and [place] [address] [phone] (office) [phone] (fax)

## 2020-10-14 ENCOUNTER — Encounter (HOSPITAL_COMMUNITY): Payer: Self-pay

## 2020-10-14 ENCOUNTER — Ambulatory Visit (HOSPITAL_COMMUNITY)
Admission: RE | Admit: 2020-10-14 | Discharge: 2020-10-14 | Disposition: A | Discharge status: Home / Self Care | Payer: Medicare PPO | Source: Ambulatory Visit | Attending: Family Medicine | Admitting: Family Medicine

## 2020-10-14 ENCOUNTER — Other Ambulatory Visit: Payer: Self-pay

## 2020-10-14 VITALS — BP 115/79 | HR 79 | Wt 131.8 lb

## 2020-10-14 DIAGNOSIS — Z7901 Long term (current) use of anticoagulants: Secondary | ICD-10-CM | POA: Insufficient documentation

## 2020-10-14 DIAGNOSIS — I340 Nonrheumatic mitral (valve) insufficiency: Secondary | ICD-10-CM | POA: Diagnosis not present

## 2020-10-14 DIAGNOSIS — I472 Ventricular tachycardia, unspecified: Secondary | ICD-10-CM

## 2020-10-14 DIAGNOSIS — I480 Paroxysmal atrial fibrillation: Secondary | ICD-10-CM | POA: Diagnosis not present

## 2020-10-14 DIAGNOSIS — I5022 Chronic systolic (congestive) heart failure: Secondary | ICD-10-CM

## 2020-10-14 DIAGNOSIS — Z79899 Other long term (current) drug therapy: Secondary | ICD-10-CM | POA: Diagnosis not present

## 2020-10-14 DIAGNOSIS — Z8673 Personal history of transient ischemic attack (TIA), and cerebral infarction without residual deficits: Secondary | ICD-10-CM | POA: Insufficient documentation

## 2020-10-14 DIAGNOSIS — N184 Chronic kidney disease, stage 4 (severe): Secondary | ICD-10-CM | POA: Diagnosis not present

## 2020-10-14 DIAGNOSIS — I428 Other cardiomyopathies: Secondary | ICD-10-CM | POA: Diagnosis not present

## 2020-10-14 DIAGNOSIS — Z95810 Presence of automatic (implantable) cardiac defibrillator: Secondary | ICD-10-CM | POA: Diagnosis not present

## 2020-10-14 DIAGNOSIS — I4821 Permanent atrial fibrillation: Secondary | ICD-10-CM | POA: Diagnosis not present

## 2020-10-14 LAB — COMPREHENSIVE METABOLIC PANEL
ALT: 13 U/L (ref 0–44)
AST: 16 U/L (ref 15–41)
Albumin: 3.9 g/dL (ref 3.5–5.0)
Alkaline Phosphatase: 85 U/L (ref 38–126)
Anion gap: 9 (ref 5–15)
BUN: 17 mg/dL (ref 8–23)
CO2: 25 mmol/L (ref 22–32)
Calcium: 10 mg/dL (ref 8.9–10.3)
Chloride: 106 mmol/L (ref 98–111)
Creatinine, Ser: 1.76 mg/dL — ABNORMAL HIGH (ref 0.44–1.00)
GFR, Estimated: 31 mL/min — ABNORMAL LOW (ref >60)
Glucose, Bld: 84 mg/dL (ref 70–99)
Potassium: 4.4 mmol/L (ref 3.5–5.1)
Sodium: 140 mmol/L (ref 135–145)
Total Bilirubin: 1.8 mg/dL — ABNORMAL HIGH (ref 0.3–1.2)
Total Protein: 7.2 g/dL (ref 6.5–8.1)

## 2020-10-14 LAB — TSH: TSH: 1.31 u[IU]/mL (ref 0.350–4.500)

## 2020-10-14 MED ORDER — FUROSEMIDE 40 MG PO TABS: 40.0000 mg | ORAL_TABLET | Freq: Every day | ORAL | 3 refills | Status: DC | PRN

## 2020-10-14 MED ORDER — DAPAGLIFLOZIN PROPANEDIOL 10 MG PO TABS: ORAL_TABLET | ORAL | 0 refills | Status: DC

## 2020-10-14 MED ORDER — SPIRONOLACTONE 25 MG PO TABS: 12.5000 mg | ORAL_TABLET | Freq: Every day | ORAL | 3 refills | Status: DC

## 2020-10-14 MED ORDER — AMIODARONE HCL 200 MG PO TABS: 100.0000 mg | ORAL_TABLET | Freq: Every day | ORAL | 2 refills | Status: DC

## 2020-10-14 MED ORDER — APIXABAN 2.5 MG PO TABS: 2.5000 mg | ORAL_TABLET | Freq: Two times a day (BID) | ORAL | 0 refills | Status: DC

## 2020-10-14 MED ORDER — ENTRESTO 49-51 MG PO TABS: 1.0000 | ORAL_TABLET | Freq: Two times a day (BID) | ORAL | 3 refills | Status: DC

## 2020-10-14 MED ORDER — CARVEDILOL 3.125 MG PO TABS: 3.1250 mg | ORAL_TABLET | Freq: Two times a day (BID) | ORAL | 3 refills | Status: DC

## 2020-10-14 NOTE — Patient Instructions (Signed)
Labs done today, your results will be available in MyChart, we will contact you for abnormal readings.  Your physician recommends that you schedule a follow-up appointment in: 3-4 months  If you have any questions or concerns before your next appointment please send [place] a message through [place] or call our office at [phone].    TO LEAVE A MESSAGE FOR THE NURSE SELECT OPTION 2, PLEASE LEAVE A MESSAGE INCLUDING: . YOUR NAME . DATE OF BIRTH . CALL BACK NUMBER . REASON FOR CALL**this is important as we prioritize the call backs  YOU WILL RECEIVE A CALL BACK THE SAME DAY AS LONG AS YOU CALL BEFORE 4:00 PM

## 2020-10-20 ENCOUNTER — Encounter (HOSPITAL_COMMUNITY): Payer: Self-pay

## 2020-10-26 ENCOUNTER — Ambulatory Visit: Payer: Medicare PPO

## 2020-10-26 DIAGNOSIS — I5022 Chronic systolic (congestive) heart failure: Secondary | ICD-10-CM

## 2020-10-26 DIAGNOSIS — Z95810 Presence of automatic (implantable) cardiac defibrillator: Secondary | ICD-10-CM

## 2020-10-28 NOTE — Progress Notes (Signed)
EPIC Encounter for ICM Monitoring  Patient Name: Kristi Parker is a 70 y.o. female Date: 10/28/2020 Primary Care Physican: [name], MD Primary Cardiologist:[name] Electrophysiologist: Allred Bi-V Pacing: 99.7% 3/16/2022Weight:127-129lbs     Spoke with patient and reports feeling well at this time.  Denies fluid symptoms.    Optivol thoracic impedance normal.   Prescribed: Furosemide40 mgtake 1 tablet by mouth daily. May take extra 1/2 tablet as needed for swelling. 03/23/20 Pt reports taking PRN instead of daily Spironolactone 25 mg take 1 tablet daily  Labs: 10/14/2020 Creatinine 1.76, BUN 17, Potassium 4.4, Sodium 140, GFR 31 02/12/2020 Creatinine2.24, BUN18, Potassium4.0, Sodium141, GFR22-25 01/29/2020 Creatinine2.10, BUN23, Potassium4.1, Sodium138, XBD53-29 10/09/2019 Creatinine 1.82, BUN 13, Potassium 4.0, Sodium 143, GFR 28-33 06/05/2019 Creatinine 2.05, BUN 27, Potassium 4.0, Sodium 141, GFR 25-29 A complete set of results can be found in Results Review.  Recommendations: No changes and encouraged to call if experiencing any fluid symptoms.  Follow-up plan: ICM clinic phone appointment on4/18/2022. 91 day device clinic remote transmission4/22/2022.  EP/Cardiology Office Visits: 12/28/2020 with Dr Rayann Heman.01/13/2021 with Dr Haroldine Laws.  Copy of ICM check sent to Dr.Allred.   3 month ICM trend: 10/26/2020.    1 Year ICM trend:       [name], RN 10/28/2020 5:09 PM

## 2020-11-03 DIAGNOSIS — H401112 Primary open-angle glaucoma, right eye, moderate stage: Secondary | ICD-10-CM | POA: Diagnosis not present

## 2020-11-03 DIAGNOSIS — H2513 Age-related nuclear cataract, bilateral: Secondary | ICD-10-CM | POA: Diagnosis not present

## 2020-11-03 DIAGNOSIS — G464 Cerebellar stroke syndrome: Secondary | ICD-10-CM | POA: Diagnosis not present

## 2020-11-03 DIAGNOSIS — H401123 Primary open-angle glaucoma, left eye, severe stage: Secondary | ICD-10-CM | POA: Diagnosis not present

## 2020-11-03 DIAGNOSIS — G458 Other transient cerebral ischemic attacks and related syndromes: Secondary | ICD-10-CM | POA: Diagnosis not present

## 2020-11-10 ENCOUNTER — Other Ambulatory Visit (HOSPITAL_COMMUNITY): Payer: Self-pay | Admitting: Family Medicine

## 2020-11-21 ENCOUNTER — Other Ambulatory Visit (HOSPITAL_COMMUNITY): Payer: Self-pay | Admitting: Family Medicine

## 2020-11-30 ENCOUNTER — Ambulatory Visit (INDEPENDENT_AMBULATORY_CARE_PROVIDER_SITE_OTHER): Payer: Medicare PPO

## 2020-11-30 DIAGNOSIS — I5022 Chronic systolic (congestive) heart failure: Secondary | ICD-10-CM | POA: Diagnosis not present

## 2020-11-30 DIAGNOSIS — Z95810 Presence of automatic (implantable) cardiac defibrillator: Secondary | ICD-10-CM

## 2020-12-04 ENCOUNTER — Ambulatory Visit (INDEPENDENT_AMBULATORY_CARE_PROVIDER_SITE_OTHER): Payer: Medicare PPO

## 2020-12-04 ENCOUNTER — Telehealth: Payer: Self-pay

## 2020-12-04 DIAGNOSIS — I442 Atrioventricular block, complete: Secondary | ICD-10-CM

## 2020-12-04 NOTE — Telephone Encounter (Signed)
Remote ICM transmission received.  Attempted call to patient regarding ICM remote transmission and no voice mail option.

## 2020-12-04 NOTE — Progress Notes (Signed)
EPIC Encounter for ICM Monitoring  Patient Name: Kristi Parker is a 70 y.o. female Date: 12/04/2020 Primary Care Physican: [name], MD Primary Cardiologist:[name] Electrophysiologist: Allred Bi-V Pacing: 99.2% 3/16/2022Weight:127-129lbs     Spoke with patient and reports feeling well at this time.  Denies fluid symptoms.    Optivol thoracic impedance normal.   Prescribed: Furosemide40 mgtake 1 tablet by mouth daily as needed. May take extra 1/2 tablet as needed for swelling.  Spironolactone 25 mg take 0.5 tablet (12.5 mg total) daily  Labs: 10/14/2020 Creatinine 1.76, BUN 17, Potassium 4.4, Sodium 140, GFR 31 02/12/2020 Creatinine2.24, BUN18, Potassium4.0, [id number], [id number] 01/29/2020 Creatinine2.10, BUN23, Potassium4.1, Sodium138, KNL97-67 10/09/2019 Creatinine 1.82, BUN 13, Potassium 4.0, Sodium 143, GFR 28-33 06/05/2019 Creatinine 2.05, BUN 27, Potassium 4.0, Sodium 141, GFR 25-29 A complete set of results can be found in Results Review.  Recommendations: No changes and encouraged to call if experiencing any fluid symptoms.  Follow-up plan: ICM clinic phone appointment on6/13/2022. 91 day device clinic remote transmission7/22/2022.  EP/Cardiology Office Visits: 12/28/2020 with Dr Rayann Heman.01/13/2021 with Dr Haroldine Laws.  Copy of ICM check sent to Dr.Allred.  3 month ICM trend: 12/04/2020.    1 Year ICM trend:       [name], RN 12/04/2020 4:43 PM

## 2020-12-05 LAB — CUP PACEART REMOTE DEVICE CHECK
Battery Remaining Longevity: 18 mo
Battery Voltage: 2.92 V
Brady Statistic AP VP Percent: 99.9 %
Brady Statistic AP VS Percent: 0.09 %
Brady Statistic AS VP Percent: 0 %
Brady Statistic AS VS Percent: 0 %
Brady Statistic RA Percent Paced: 100 %
Brady Statistic RV Percent Paced: 99.18 %
Date Time Interrogation Session: 20220422001808
HighPow Impedance: 38 Ohm
Implantable Lead Implant Date: 20091104
Implantable Lead Implant Date: 20091104
Implantable Lead Implant Date: 20171214
Implantable Lead Location: 753858
Implantable Lead Location: 753859
Implantable Lead Location: 753860
Implantable Lead Model: 4196
Implantable Lead Model: 5076
Implantable Pulse Generator Implant Date: 20171214
Lead Channel Impedance Value: 1121 Ohm
Lead Channel Impedance Value: 399 Ohm
Lead Channel Impedance Value: 399 Ohm
Lead Channel Impedance Value: 475 Ohm
Lead Channel Impedance Value: 532 Ohm
Lead Channel Impedance Value: 722 Ohm
Lead Channel Pacing Threshold Amplitude: 0.5 V
Lead Channel Pacing Threshold Amplitude: 0.625 V
Lead Channel Pacing Threshold Amplitude: 3 V
Lead Channel Pacing Threshold Pulse Width: 0.4 ms
Lead Channel Pacing Threshold Pulse Width: 0.4 ms
Lead Channel Pacing Threshold Pulse Width: 0.6 ms
Lead Channel Sensing Intrinsic Amplitude: 0.125 mV
Lead Channel Sensing Intrinsic Amplitude: 0.125 mV
Lead Channel Sensing Intrinsic Amplitude: 7 mV
Lead Channel Sensing Intrinsic Amplitude: 7 mV
Lead Channel Setting Pacing Amplitude: 2 V
Lead Channel Setting Pacing Amplitude: 2.5 V
Lead Channel Setting Pacing Amplitude: 4.5 V
Lead Channel Setting Pacing Pulse Width: 0.4 ms
Lead Channel Setting Pacing Pulse Width: 0.6 ms
Lead Channel Setting Sensing Sensitivity: 0.3 mV

## 2020-12-23 ENCOUNTER — Other Ambulatory Visit (HOSPITAL_COMMUNITY): Payer: Self-pay | Admitting: *Deleted

## 2020-12-23 MED ORDER — DAPAGLIFLOZIN PROPANEDIOL 10 MG PO TABS: 10.0000 mg | ORAL_TABLET | Freq: Every day | ORAL | 6 refills | Status: DC

## 2020-12-23 NOTE — Progress Notes (Signed)
Remote ICD transmission.   

## 2020-12-24 ENCOUNTER — Encounter (INDEPENDENT_AMBULATORY_CARE_PROVIDER_SITE_OTHER): Payer: Self-pay | Admitting: Otolaryngology

## 2020-12-24 ENCOUNTER — Ambulatory Visit (INDEPENDENT_AMBULATORY_CARE_PROVIDER_SITE_OTHER): Payer: Medicare PPO | Admitting: Otolaryngology

## 2020-12-24 ENCOUNTER — Other Ambulatory Visit: Payer: Self-pay

## 2020-12-24 VITALS — Temp 96.4°F

## 2020-12-24 DIAGNOSIS — H9042 Sensorineural hearing loss, unilateral, left ear, with unrestricted hearing on the contralateral side: Secondary | ICD-10-CM

## 2020-12-24 NOTE — Progress Notes (Signed)
HPI: Kristi Parker is a 70 y.o. female who presents is referred by [facility] for evaluation of conductive hearing loss noted on recent audiogram.  Patient presented to [facility] because of decreased hearing in her right ear which was her better hearing ear.  Earwax was cleaned and hearing was improved in the right ear.  They underwent audiologic testing that showed a large conductive loss in the left ear. Patient stated that she initially lost hearing in the left ear about 8 or 9 years ago.  This was an sudden loss in the hearing in the left ear that occurred over a 24-hour period.  At that time she had some vertigo and dizziness around the time she lost her hearing in the left ear that developed acutely.  She denies any trauma to the ear at that time.  She was seen by [facility] and had a hearing test performed that showed hearing loss in the left ear and nothing else was recommended at that time.  She has lived with a hearing loss over the last 8 or 9years.  More recently noticed decreased hearing in the right ear and had earwax that was removed and hearing was improved..  Past Medical History:  Diagnosis Date  . Adenomatous colon polyp   . Cervical radiculopathy at C5   . Chronic systolic heart failure ([place])   . CKD (chronic kidney disease), stage III ([place])   . DDD (degenerative disc disease), cervical   . DDD (degenerative disc disease), lumbar   . Fibroid   . GERD (gastroesophageal reflux disease)   . Hearing loss 02/2010   L hearing loss/vertigo, steroids  . History of seizure disorder    Related Hx  . Mitral regurgitation    moderate  . Multiple sclerosis ([place]) 1984  . Nonischemic cardiomyopathy (HCC)    Moderate LVEF 35-40% by ECHO 2011, 25-30% by echo 2013  . Permanent atrial fibrillation ([place])   . Pulmonary hypertension, secondary 06/04/2013   As a result of nonischemic cardiomyopathy EF 25-30%  . Stroke [facility])    Right brain CVA, complete recovery 07/2003   . Ventricular tachycardia ([place])   . Vertigo 02/2010   L hearing loss/vertigo, steroids   Past Surgical History:  Procedure Laterality Date  . BIV PACEMAKER GENERATOR CHANGE OUT N/A 09/18/2014   Procedure: BIV PACEMAKER GENERATOR CHANGE OUT;  Surgeon: Thompson Grayer, MD;  Location: [facility] CATH LAB;  Service: Cardiovascular;  Laterality: N/A;  . DILATION AND CURETTAGE OF UTERUS    . EP IMPLANTABLE DEVICE N/A 07/28/2016   Procedure: ICD Implant;  Surgeon: Evans Lance, MD;  Location: [place] CV LAB;  Service: Cardiovascular;  Laterality: N/A;  . HYSTEROSCOPY    . PACEMAKER INSERTION     PTVP 08/2001 for complete heart block. Upgrade PTVP to MDT BiV 06/2008 by Dr Leonia Reeves  . RIGHT HEART CATH N/A 05/17/2018   Procedure: RIGHT HEART CATH;  Surgeon: Jolaine Artist, MD;  Location: [place] CV LAB;  Service: Cardiovascular;  Laterality: N/A;  . RIGHT/LEFT HEART CATH AND CORONARY ANGIOGRAPHY N/A 02/02/2018   Procedure: RIGHT/LEFT HEART CATH AND CORONARY ANGIOGRAPHY;  Surgeon: Jolaine Artist, MD;  Location: [place] CV LAB;  Service: Cardiovascular;  Laterality: N/A;  . TUBAL LIGATION     Social History   Socioeconomic History  . Marital status: Married    Spouse name: Not on file  . Number of children: Not on file  . Years of education: Not on file  .  Highest education level: Not on file  Occupational History  . Not on file  Tobacco Use  . Smoking status: Never Smoker  . Smokeless tobacco: Never Used  Vaping Use  . Vaping Use: Never used  Substance and Sexual Activity  . Alcohol use: Yes    Comment: rare  . Drug use: No  . Sexual activity: Yes    Birth control/protection: Post-menopausal  Other Topics Concern  . Not on file  Social History Narrative  . Not on file   Social Determinants of Health   Financial Resource Strain: Not on file  Food Insecurity: Not on file  Transportation Needs: Not on file  Physical Activity: Not on file  Stress: Not on file  Social  Connections: Not on file   Family History  Problem Relation Age of Onset  . Diabetes Mother   . Multiple sclerosis Mother   . Cancer Father        ?  . CAD Other        Father's side CAD   No Known Allergies Prior to Admission medications   Medication Sig Start Date End Date Taking? Authorizing Provider  acetaminophen (TYLENOL) 500 MG tablet Take 500-1,000 mg by mouth every 6 (six) hours as needed (for back pain).     [provider]  ALPHAGAN P 0.1 % SOLN Place 1 drop into both eyes 2 (two) times daily.  05/13/17   [provider]  amiodarone (PACERONE) 200 MG tablet Take 0.5 tablets (100 mg total) by mouth daily. 10/14/20   Milford, Maricela Bo, FNP  apixaban (ELIQUIS) 2.5 MG TABS tablet Take 1 tablet (2.5 mg total) by mouth 2 (two) times daily. 11/25/20   Bensimhon, Shaune Pascal, MD  Biotin 2500 MCG CAPS Take 2,500 mcg by mouth at bedtime.    [provider]  Calcium Carbonate-Vitamin D (CALCIUM + D PO) Take 1 tablet by mouth every evening.    [provider]  carvedilol (COREG) 3.125 MG tablet Take 1 tablet (3.125 mg total) by mouth 2 (two) times daily. 10/14/20   Milford, Maricela Bo, FNP  dapagliflozin propanediol (FARXIGA) 10 MG TABS tablet Take 1 tablet (10 mg total) by mouth daily before breakfast. 12/23/20   Bensimhon, Shaune Pascal, MD  fexofenadine (ALLEGRA) 180 MG tablet Take 180 mg by mouth daily as needed for allergies.     [provider]  furosemide (LASIX) 40 MG tablet Take 1 tablet (40 mg total) by mouth daily as needed. TAKE 1 TABLET BY MOUTH  DAILY. MAY TAKE EXTRA 1/2  TABLET AS NEEDED FOR  SWELLING. 10/14/20   Rafael Bihari, FNP  levothyroxine (SYNTHROID, LEVOTHROID) 50 MCG tablet Take 50 mcg by mouth daily before breakfast.     [provider]  loratadine (CLARITIN) 10 MG tablet Take 10 mg by mouth daily as needed for allergies.    [provider]  Magnesium 500 MG TABS Take 500 mg by mouth at bedtime.    [provider]  sacubitril-valsartan (ENTRESTO) 49-51 MG Take 1 tablet by mouth 2 (two) times daily. 10/14/20   Rafael Bihari, FNP  spironolactone (ALDACTONE) 25 MG tablet Take 0.5 tablets (12.5 mg total) by mouth daily. 10/14/20   Rafael Bihari, FNP     Positive ROS: Otherwise negative  All other systems have been reviewed and were otherwise negative with the exception of those mentioned in the HPI and as above.  Physical Exam: Constitutional: Alert, well-appearing, no acute distress Ears: External ears  without lesions or tenderness. Ear canals are clear bilaterally.  Both TMs are clear with no middle ear abnormality noted.  On hearing screening with the tuning forks Weber lateralized to the right ear.  AC was greater than BC on the right side.  AC was equivocal to [facility] on the left side.  She was able to hear the tuning fork a little bit on the left side. Nasal: External nose without lesions. Septum midline with mild rhinitis. Clear nasal passages otherwise. Oral: Lips and gums without lesions. Tongue and palate mucosa without lesions. Posterior oropharynx clear. Neck: No palpable adenopathy or masses Respiratory: Breathing comfortably  Skin: No facial/neck lesions or rash noted.  Procedures  Assessment: Clear TMs bilaterally. Predominantly sensorineural hearing loss in the left ear although she does appear to have some conductive loss on tuning fork testing with the 512 tuning fork.  Plan: Recommended to the patient to obtain her previous hearing test that was performed at [facility] 8 or 9 years ago.  She will bring this to the office. We will also schedule [facility] with [facility].  As [facility] does not take her insurance here. I am not sure I agree with the audiogram that she brings with her to the office today.   Radene Journey, MD   CC:

## 2020-12-28 ENCOUNTER — Telehealth (INDEPENDENT_AMBULATORY_CARE_PROVIDER_SITE_OTHER): Payer: Medicare PPO | Admitting: Internal Medicine

## 2020-12-28 VITALS — Ht 63.0 in | Wt 127.0 lb

## 2020-12-28 DIAGNOSIS — I442 Atrioventricular block, complete: Secondary | ICD-10-CM | POA: Diagnosis not present

## 2020-12-28 DIAGNOSIS — I428 Other cardiomyopathies: Secondary | ICD-10-CM

## 2020-12-28 DIAGNOSIS — I480 Paroxysmal atrial fibrillation: Secondary | ICD-10-CM

## 2020-12-28 DIAGNOSIS — D6869 Other thrombophilia: Secondary | ICD-10-CM

## 2020-12-28 NOTE — Progress Notes (Signed)
Electrophysiology TeleHealth Note   Due to national recommendations of social distancing due to COVID 19, an audio/video telehealth visit is felt to be most appropriate for this patient at this time.  See MyChart message from today for the patient's consent to telehealth for [name].  Date:  12/28/2020   ID:  Kristi Parker, DOB [birth year], MRN [id number]  Location: patient's home  Provider location:  [place]  Evaluation Performed: Follow-up visit  PCP:  Kristi Orn, MD   Electrophysiologist:  Dr Rayann Heman  Chief Complaint:  palpitations  History of Present Illness:    Kristi Parker is a 70 y.o. female who presents via telehealth conferencing today.  Since last being seen in our clinic, the patient reports doing very well.  She is able to ride her bike for 6 miles most days.  She is active and feels well.  Today, she denies symptoms of palpitations, chest pain, shortness of breath,  lower extremity edema, dizziness, presyncope, or syncope.  The patient is otherwise without complaint today.   Past Medical History:  Diagnosis Date  . Adenomatous colon polyp   . Cervical radiculopathy at C5   . Chronic systolic heart failure ([place])   . CKD (chronic kidney disease), stage III ([place])   . DDD (degenerative disc disease), cervical   . DDD (degenerative disc disease), lumbar   . Fibroid   . GERD (gastroesophageal reflux disease)   . Hearing loss 02/2010   L hearing loss/vertigo, steroids  . History of seizure disorder    Related Hx  . Mitral regurgitation    moderate  . Multiple sclerosis ([place]) 1984  . Nonischemic cardiomyopathy (H[facility])    Moderate LVEF 35-40% by ECHO 2011, 25-30% by echo 2013  . Permanent atrial fibrillation ([place])   . Pulmonary hypertension, secondary 06/04/2013   As a result of nonischemic cardiomyopathy EF 25-30%  . Stroke [facility])    Right brain CVA, complete recovery 07/2003  . Ventricular tachycardia ([place])   . Vertigo 02/2010   L hearing  loss/vertigo, steroids    Past Surgical History:  Procedure Laterality Date  . BIV PACEMAKER GENERATOR CHANGE OUT N/A 09/18/2014   Procedure: BIV PACEMAKER GENERATOR CHANGE OUT;  Surgeon: Thompson Grayer, MD;  Location: [facility] CATH LAB;  Service: Cardiovascular;  Laterality: N/A;  . DILATION AND CURETTAGE OF UTERUS    . EP IMPLANTABLE DEVICE N/A 07/28/2016   Procedure: ICD Implant;  Surgeon: Evans Lance, MD;  Location: [place] CV LAB;  Service: Cardiovascular;  Laterality: N/A;  . HYSTEROSCOPY    . PACEMAKER INSERTION     PTVP 08/2001 for complete heart block. Upgrade PTVP to MDT BiV 06/2008 by Dr Leonia Reeves  . RIGHT HEART CATH N/A 05/17/2018   Procedure: RIGHT HEART CATH;  Surgeon: Jolaine Artist, MD;  Location: [place] CV LAB;  Service: Cardiovascular;  Laterality: N/A;  . RIGHT/LEFT HEART CATH AND CORONARY ANGIOGRAPHY N/A 02/02/2018   Procedure: RIGHT/LEFT HEART CATH AND CORONARY ANGIOGRAPHY;  Surgeon: Jolaine Artist, MD;  Location: [place] CV LAB;  Service: Cardiovascular;  Laterality: N/A;  . TUBAL LIGATION      Current Outpatient Medications  Medication Sig Dispense Refill  . acetaminophen (TYLENOL) 500 MG tablet Take 500-1,000 mg by mouth every 6 (six) hours as needed (for back pain).     . ALPHAGAN P 0.1 % SOLN Place 1 drop into both eyes 2 (two) times daily.     [name] amiodarone (PACERONE) 200 MG tablet  Take 0.5 tablets (100 mg total) by mouth daily. 135 tablet 2  . apixaban (ELIQUIS) 2.5 MG TABS tablet Take 1 tablet (2.5 mg total) by mouth 2 (two) times daily. 180 tablet 3  . Biotin 2500 MCG CAPS Take 2,500 mcg by mouth at bedtime.    . Calcium Carbonate-Vitamin D (CALCIUM + D PO) Take 1 tablet by mouth every evening.    . carvedilol (COREG) 3.125 MG tablet Take 1 tablet (3.125 mg total) by mouth 2 (two) times daily. 90 tablet 3  . dapagliflozin propanediol (FARXIGA) 10 MG TABS tablet Take 1 tablet (10 mg total) by mouth daily before breakfast. 30 tablet 6  .  fexofenadine (ALLEGRA) 180 MG tablet Take 180 mg by mouth daily as needed for allergies.     . furosemide (LASIX) 40 MG tablet Take 1 tablet (40 mg total) by mouth daily as needed. TAKE 1 TABLET BY MOUTH  DAILY. MAY TAKE EXTRA 1/2  TABLET AS NEEDED FOR  SWELLING. 135 tablet 3  . levothyroxine (SYNTHROID, LEVOTHROID) 50 MCG tablet Take 50 mcg by mouth daily before breakfast.     . loratadine (CLARITIN) 10 MG tablet Take 10 mg by mouth daily as needed for allergies.    . Magnesium 500 MG TABS Take 500 mg by mouth at bedtime.    . sacubitril-valsartan (ENTRESTO) 49-51 MG Take 1 tablet by mouth 2 (two) times daily. 180 tablet 3  . spironolactone (ALDACTONE) 25 MG tablet Take 0.5 tablets (12.5 mg total) by mouth daily. 45 tablet 3   No current facility-administered medications for this visit.    Allergies:   Patient has no known allergies.   Social History:  The patient  reports that she has never smoked. She has never used smokeless tobacco. She reports current alcohol use. She reports that she does not use drugs.   ROS:  Please see the history of present illness.   All other systems are personally reviewed and negative.    Exam:    Vital Signs:  Ht 5\' 3"  (1.6 m)   Wt 127 lb (57.6 kg)   LMP  (LMP Unknown)   BMI 22.50 kg/m   Well sounding and appearing, alert and conversant, regular work of breathing,  good skin color Eyes- anicteric, neuro- grossly intact, skin- no apparent rash or lesions or cyanosis, mouth- oral mucosa is pink  Labs/Other Tests and Data Reviewed:    Recent Labs: 01/29/2020: B Natriuretic Peptide 147.4 10/14/2020: ALT 13; BUN 17; Creatinine, Ser 1.76; Potassium 4.4; Sodium 140; TSH 1.310   Wt Readings from Last 3 Encounters:  12/28/20 127 lb (57.6 kg)  10/14/20 131 lb 12.8 oz (59.8 kg)  06/24/20 135 lb (61.2 kg)     Last device remote is reviewed from [place] PDF which reveals normal device function, no arrhythmias    ASSESSMENT & PLAN:    1.  VT Currently  controlled with amiodarone 100mg  daily 99 % BiV paced We will need to follow closely on amiodarone to avoid toxicity She wishes to have LFts, TFTs followed by PCP Labs 3/22 Normal ICD function (remote reviewed)  2,. Chronic systolic dysfunction/ nonischemic CM Normal BiV ICD function device optimization performed on last visit,  Doing well. Followed by [facility] Cheek  3. plersistent afib afib burden by remote is 0% chads2vasc score is 5.  Continue eliquis   Risks, benefits and potential toxicities for medications prescribed and/or refilled reviewed with patient today.   Follow-up:  12 months with EP APP  Patient Risk:  after full review of this patients clinical status, I feel that they are at moderate risk at this time.  Today, I have spent 25 minutes with the patient with telehealth technology discussing arrhythmia management .    Army Fossa, MD  12/28/2020 10:42 AM     [facility] [address] [place] [address] [phone] (office) [phone] (fax)

## 2020-12-30 ENCOUNTER — Telehealth (INDEPENDENT_AMBULATORY_CARE_PROVIDER_SITE_OTHER): Payer: Self-pay

## 2020-12-30 NOTE — Telephone Encounter (Signed)
Kristi Parker called me today to let me know that [facility] had not called her yet from referral I faxed them. I called [facility] and they stated they had not received faxed referral for a hearing test for [name]. I did refax order and I also called [facility] for pt since she had signed papers for medical release to [place] from them. I did leave a message since I could not get a human on phone and pt wants records sent here of previous hearing test. I called pt and informed her of all. If she does not hear from [facility] by mid next week she is to call me and let me know because they confirmed receiving fax today.

## 2021-01-13 ENCOUNTER — Encounter (HOSPITAL_COMMUNITY): Payer: Self-pay | Admitting: Internal Medicine

## 2021-01-13 ENCOUNTER — Other Ambulatory Visit: Payer: Self-pay

## 2021-01-13 ENCOUNTER — Ambulatory Visit (HOSPITAL_COMMUNITY)
Admission: RE | Admit: 2021-01-13 | Discharge: 2021-01-13 | Disposition: A | Discharge status: Home / Self Care | Payer: Medicare PPO | Source: Ambulatory Visit | Attending: Internal Medicine | Admitting: Internal Medicine

## 2021-01-13 VITALS — BP 110/70 | HR 71 | Wt 130.4 lb

## 2021-01-13 DIAGNOSIS — I5022 Chronic systolic (congestive) heart failure: Secondary | ICD-10-CM | POA: Diagnosis not present

## 2021-01-13 DIAGNOSIS — Z8673 Personal history of transient ischemic attack (TIA), and cerebral infarction without residual deficits: Secondary | ICD-10-CM | POA: Diagnosis not present

## 2021-01-13 DIAGNOSIS — Z7901 Long term (current) use of anticoagulants: Secondary | ICD-10-CM | POA: Insufficient documentation

## 2021-01-13 DIAGNOSIS — I428 Other cardiomyopathies: Secondary | ICD-10-CM | POA: Insufficient documentation

## 2021-01-13 DIAGNOSIS — N184 Chronic kidney disease, stage 4 (severe): Secondary | ICD-10-CM

## 2021-01-13 DIAGNOSIS — I472 Ventricular tachycardia, unspecified: Secondary | ICD-10-CM

## 2021-01-13 DIAGNOSIS — Z79899 Other long term (current) drug therapy: Secondary | ICD-10-CM | POA: Diagnosis not present

## 2021-01-13 DIAGNOSIS — I4821 Permanent atrial fibrillation: Secondary | ICD-10-CM | POA: Diagnosis not present

## 2021-01-13 DIAGNOSIS — I480 Paroxysmal atrial fibrillation: Secondary | ICD-10-CM

## 2021-01-13 DIAGNOSIS — Z95810 Presence of automatic (implantable) cardiac defibrillator: Secondary | ICD-10-CM

## 2021-01-13 HISTORY — DX: Heart failure, unspecified: I50.9

## 2021-01-13 LAB — COMPREHENSIVE METABOLIC PANEL
ALT: 15 U/L (ref 0–44)
AST: 16 U/L (ref 15–41)
Albumin: 4.3 g/dL (ref 3.5–5.0)
Alkaline Phosphatase: 139 U/L — ABNORMAL HIGH (ref 38–126)
Anion gap: 8 (ref 5–15)
BUN: 18 mg/dL (ref 8–23)
CO2: 28 mmol/L (ref 22–32)
Calcium: 10.3 mg/dL (ref 8.9–10.3)
Chloride: 104 mmol/L (ref 98–111)
Creatinine, Ser: 1.87 mg/dL — ABNORMAL HIGH (ref 0.44–1.00)
GFR, Estimated: 29 mL/min — ABNORMAL LOW (ref >60)
Glucose, Bld: 94 mg/dL (ref 70–99)
Potassium: 4.3 mmol/L (ref 3.5–5.1)
Sodium: 140 mmol/L (ref 135–145)
Total Bilirubin: 2 mg/dL — ABNORMAL HIGH (ref 0.3–1.2)
Total Protein: 7.7 g/dL (ref 6.5–8.1)

## 2021-01-13 LAB — CBC
HCT: 38.5 % (ref 36.0–46.0)
Hemoglobin: 12.5 g/dL (ref 12.0–15.0)
MCH: 32 pg (ref 26.0–34.0)
MCHC: 32.5 g/dL (ref 30.0–36.0)
MCV: 98.5 fL (ref 80.0–100.0)
Platelets: 142 10*3/uL — ABNORMAL LOW (ref 150–400)
RBC: 3.91 MIL/uL (ref 3.87–5.11)
RDW: 13.3 % (ref 11.5–15.5)
WBC: 4.4 10*3/uL (ref 4.0–10.5)
nRBC: 0 % (ref 0.0–0.2)

## 2021-01-13 LAB — BRAIN NATRIURETIC PEPTIDE: B Natriuretic Peptide: 192.6 pg/mL — ABNORMAL HIGH (ref 0.0–100.0)

## 2021-01-13 LAB — TSH: TSH: 1.436 u[IU]/mL (ref 0.350–4.500)

## 2021-01-13 LAB — T4, FREE: Free T4: 1.51 ng/dL — ABNORMAL HIGH (ref 0.61–1.12)

## 2021-01-13 NOTE — Patient Instructions (Addendum)
Labs done today. We will contact you only if your labs are abnormal.  No medication changes were made. Please continue all current medications as prescribed.  Your physician recommends that you schedule a follow-up appointment in: 6 months with an echo prior to your appointment. Please contact our office in November for a December appointment.  Your physician has requested that you have an echocardiogram. Echocardiography is a painless test that uses sound waves to create images of your heart. It provides your doctor with information about the size and shape of your heart and how well your heart's chambers and valves are working. This procedure takes approximately one hour. There are no restrictions for this procedure.  If you have any questions or concerns before your next appointment please send [place] a message through [place] or call our office at [phone].    TO LEAVE A MESSAGE FOR THE NURSE SELECT OPTION 2, PLEASE LEAVE A MESSAGE INCLUDING: . YOUR NAME . DATE OF BIRTH . CALL BACK NUMBER . REASON FOR CALL**this is important as we prioritize the call backs  YOU WILL RECEIVE A CALL BACK THE SAME DAY AS LONG AS YOU CALL BEFORE 4:00 PM   Do the following things EVERYDAY: 1) Weigh yourself in the morning before breakfast. Write it down and keep it in a log. 2) Take your medicines as prescribed 3) Eat low salt foods--Limit salt (sodium) to 2000 mg per day.  4) Stay as active as you can everyday 5) Limit all fluids for the day to less than 2 liters   At the [facility], you and your health needs are our priority. As part of our continuing mission to provide you with exceptional heart care, we have created designated Provider Care Teams. These Care Teams include your primary Cardiologist (physician) and Advanced Practice Providers (APPs- Physician Assistants and Nurse Practitioners) who all work together to provide you with the care you need, when you need it.   You may  see any of the following providers on your designated Care Team at your next follow up: [name] Dr Glori Bickers . Dr Loralie Champagne . Darrick Grinder, NP . Lyda Jester, PA . Audry Riles, PharmD   Please be sure to bring in all your medications bottles to every appointment.

## 2021-01-13 NOTE — Addendum Note (Signed)
Encounter addended by: Jolaine Artist, MD on: 01/13/2021 10:35 PM  Actions taken: Level of Service modified, Visit diagnoses modified, Charge Capture section accepted

## 2021-01-13 NOTE — Progress Notes (Signed)
Advance Heart Failure Clinic Note   PCP:  [name], MD  Cardiologist:  Candee Furbish, MD Primary HF: Azari Hasler  Chief Complaint: Heart Failure follow-up   History of Present Illness:  Kristi Parker is a 70 y.o. female with history of VT, severe systolic HF due to NICM (EF 20-25%) s/p Medtronic BiV ICD, CVA 2004, PAF, and CKD III-IV.   She was first diagnosed with HF in 2013. She is unsure of the cause. She had a heart cath and did not have any blockages. We also follow her son in HF clinic for NICM. She states that multiple family members have severe HF and several have underwent transplant.  Based on CPX testing, we referred her to Dr. Mosetta Pigeon at [facility]. She was seen there on 02/22/18 and they discussed need for advanced therapies including possible heart or heart-kidney transplant. She had f/u with them subsequently and felt not to be candidate for heart-kidney transplant but might be candidate for high-risk VAD. We reviewed case with [facility] and she was also turned down for heart/kidney transplant due to age. She has decided she is not interested in VAD.   Had repeat CPX in 10/19 with low Vo2 (13.5) and high slope (42). Repeat RHC done on 10/19 as below as part of transplant work up at [facility] showed elevated volume with CI 2.7  Today she returns for HF follow up. Here with her husband. Feels "pretty good". Goes to gym 5 days per week. Rides 4-6 miles on the bike over 30-40 mins. Also does some weights. Able to do ADLs as well. Occasional edema will take lasix as needed. On apixaban. No bleeding.  Cardiac Studies: Echo (9/21): EF 25-30%, severe LV dysfunction, RV ok. Echo (8/20): EF 25% RV normal (Personally reviewed). CPX (10/19): low Vo2 (13.5), high slope (42).  RHC (10/19):  Findings:  RA = 12 RV = 53/12 PA = 51/22 (34) PCW = mean 28 (v = 35-40) Fick cardiac output/index = 4.4/2.7 PVR = 1.4 WU FA sat = 98% PA sat = 62%, 63% PAPi = 2.4 RA/PCWP =  0.42 Assessment: 1. NICM with elevated filling pressures and relatively preserved output. 2. Prominent v-waves in PCWP tracing suggestive of significant MR Plan/Discussion: Increase diuretics. Continue transplant w/u based on CPX results.   Past Medical History:  Diagnosis Date  . Adenomatous colon polyp   . Cervical radiculopathy at C5   . CHF (congestive heart failure) ([place])   . Chronic systolic heart failure ([place])   . CKD (chronic kidney disease), stage III ([place])   . DDD (degenerative disc disease), cervical   . DDD (degenerative disc disease), lumbar   . Fibroid   . GERD (gastroesophageal reflux disease)   . Hearing loss 02/2010   L hearing loss/vertigo, steroids  . History of seizure disorder    Related Hx  . Mitral regurgitation    moderate  . Multiple sclerosis ([place]) 1984  . Nonischemic cardiomyopathy (HCC)    Moderate LVEF 35-40% by ECHO 2011, 25-30% by echo 2013  . Permanent atrial fibrillation ([place])   . Pulmonary hypertension, secondary 06/04/2013   As a result of nonischemic cardiomyopathy EF 25-30%  . Stroke [facility])    Right brain CVA, complete recovery 07/2003  . Ventricular tachycardia ([place])   . Vertigo 02/2010   L hearing loss/vertigo, steroids   Past Surgical History:  Procedure Laterality Date  . BIV PACEMAKER GENERATOR CHANGE OUT N/A 09/18/2014   Procedure: BIV PACEMAKER GENERATOR CHANGE OUT;  Surgeon: Thompson Grayer, MD;  Location: [facility] CATH LAB;  Service: Cardiovascular;  Laterality: N/A;  . DILATION AND CURETTAGE OF UTERUS    . EP IMPLANTABLE DEVICE N/A 07/28/2016   Procedure: ICD Implant;  Surgeon: Evans Lance, MD;  Location: [place] CV LAB;  Service: Cardiovascular;  Laterality: N/A;  . HYSTEROSCOPY    . PACEMAKER INSERTION     PTVP 08/2001 for complete heart block. Upgrade PTVP to MDT BiV 06/2008 by Dr Leonia Reeves  . RIGHT HEART CATH N/A 05/17/2018   Procedure: RIGHT HEART CATH;  Surgeon: Jolaine Artist, MD;  Location: [place] CV LAB;  Service:  Cardiovascular;  Laterality: N/A;  . RIGHT/LEFT HEART CATH AND CORONARY ANGIOGRAPHY N/A 02/02/2018   Procedure: RIGHT/LEFT HEART CATH AND CORONARY ANGIOGRAPHY;  Surgeon: Jolaine Artist, MD;  Location: [place] CV LAB;  Service: Cardiovascular;  Laterality: N/A;  . TUBAL LIGATION      Current Outpatient Medications  Medication Sig Dispense Refill  . acetaminophen (TYLENOL) 500 MG tablet Take 500-1,000 mg by mouth every 6 (six) hours as needed (for back pain).     . ALPHAGAN P 0.1 % SOLN Place 1 drop into both eyes 2 (two) times daily.     [name] amiodarone (PACERONE) 200 MG tablet Take 0.5 tablets (100 mg total) by mouth daily. 135 tablet 2  . apixaban (ELIQUIS) 2.5 MG TABS tablet Take 1 tablet (2.5 mg total) by mouth 2 (two) times daily. 180 tablet 3  . Biotin 2500 MCG CAPS Take 2,500 mcg by mouth at bedtime.    . Calcium Carbonate-Vitamin D (CALCIUM + D PO) Take 1 tablet by mouth every evening.    . carvedilol (COREG) 3.125 MG tablet Take 1 tablet (3.125 mg total) by mouth 2 (two) times daily. 90 tablet 3  . dapagliflozin propanediol (FARXIGA) 10 MG TABS tablet Take 1 tablet (10 mg total) by mouth daily before breakfast. 30 tablet 6  . fexofenadine (ALLEGRA) 180 MG tablet Take 180 mg by mouth daily as needed for allergies.     . furosemide (LASIX) 40 MG tablet Take 1 tablet (40 mg total) by mouth daily as needed. TAKE 1 TABLET BY MOUTH  DAILY. MAY TAKE EXTRA 1/2  TABLET AS NEEDED FOR  SWELLING. 135 tablet 3  . levothyroxine (SYNTHROID, LEVOTHROID) 50 MCG tablet Take 50 mcg by mouth daily before breakfast.     . loratadine (CLARITIN) 10 MG tablet Take 10 mg by mouth daily as needed for allergies.    . Magnesium 500 MG TABS Take 500 mg by mouth at bedtime.    . sacubitril-valsartan (ENTRESTO) 49-51 MG Take 1 tablet by mouth 2 (two) times daily. 180 tablet 3  . spironolactone (ALDACTONE) 25 MG tablet Take 0.5 tablets (12.5 mg total) by mouth daily. 45 tablet 3   No current  facility-administered medications for this encounter.    Allergies:   Patient has no known allergies.   Social History:  The patient  reports that she has never smoked. She has never used smokeless tobacco. She reports current alcohol use. She reports that she does not use drugs.   Family History:  The patient's family history includes CAD in an other family member; Cancer in her father; Diabetes in her mother; Multiple sclerosis in her mother.   ROS:  Please see the history of present illness.   All other systems are personally reviewed and negative.   Vitals:   01/13/21 1039  BP: 110/70  Pulse: 71  SpO2:  100%  Weight: 59.1 kg (130 lb 6.4 oz)   Wt Readings from Last 3 Encounters:  01/13/21 59.1 kg (130 lb 6.4 oz)  12/28/20 57.6 kg (127 lb)  10/14/20 59.8 kg (131 lb 12.8 oz)   Exam:  General:  Well appearing. No resp difficulty HEENT: normal Neck: supple. no JVD. Carotids 2+ bilat; no bruits. No lymphadenopathy or thryomegaly appreciated. Cor: PMI nondisplaced. Regular rate & rhythm. No rubs, gallops or murmurs. Lungs: clear Abdomen: soft, nontender, nondistended. No hepatosplenomegaly. No bruits or masses. Good bowel sounds. Extremities: no cyanosis, clubbing, rash, edema Neuro: alert & orientedx3, cranial nerves grossly intact. moves all 4 extremities w/o difficulty. Affect pleasant   Recent Labs: 01/29/2020: B Natriuretic Peptide 147.4 10/14/2020: ALT 13; BUN 17; Creatinine, Ser 1.76; Potassium 4.4; Sodium 140; TSH 1.310  Personally reviewed    ICD interrogation: OptiVol low. > 99% Vpacing. No VT/AF. Activity level < 1hr Personally reviewed    ASSESSMENT AND PLAN:  1. Chronic Systolic Heart Failure due to NICM. S/p Medtronic BiV ICD. Likely familial CM.  - Echo (9/21): EF 25-30%, severe LV dysfunction, RV ok. - Echo (8/20): EF 25% RV normal Personally reviewed - Echo (12/14/17): EF 20-25%, trivial AI, mild to mod MR, mildly reduced RV, severely dilated RA and LA, PA  pressures moderately to severely increased.  - CPX (05/16/18): pVO2 12.1, slope 48 - RHC (05/17/18): NICM with elevated filling pressures and relatively preserved output, suggestive of significant MR - LHC (02/02/18) with normal coronaries. RHC showed severe NICM with well compensated hemodynamics. - CPX (12/27/17): Peak VO2 13.5, slope 42 - Echo 9/21; Ef 25-30% - Stable NYHA II-III, volume looks good on exam and ICD interrogation. She does not need daily lasix. - Continue carvedilol 3.125 mg bid. - Continue Entresto 49/51 mg bid. - Continue spiro 12.5 mg daily.  - Continue Farxiga 10 mg daily. - Blood type AB+. She has been seen by Dr. Mosetta Pigeon at [facility] and felt not to be candidate for heart-kidney transplant, but might be candidate for high-risk VAD. [place] declined seeing her for heart-kidney transplant due to age.   - Last discussed VAD on 6/21 (versus repeating CPX) but says she appears stable and wants to hold off for now. Although she reports NYHA II symptoms, CPX and activity level on ICD have consistently suggested worse functional capacity. - We discussed repeating CPX test today but she wants to hold off.  - I will refer for Barostim eval   - CMET today. Echo at next visit  2. PAF - Remains in NSR.  - CHA2DS2VASC of at least 5. - Continue amiodarone 100 mg daily. - Continue Eliquis 2.5 mg bid. - No bleeding issues. - Labs today  3. CKD IV - Baseline SCr ~ 1.7-1.9. - Labs today  4. MR - Mild on most recent echo (9/21). No change.  5. Hx of VT s/p Medtronic BiV ICD - ICD interrogated personally today as above. No VT  - Continue amiodarone 100 mg day.   Glori Bickers, MD  01/13/2021 11:04 AM  Advanced Heart Failure [place] [address] [phone] (office) [phone] (fax)

## 2021-01-14 LAB — T3, FREE: T3, Free: 2.2 pg/mL (ref 2.0–4.4)

## 2021-01-16 DIAGNOSIS — Z20822 Contact with and (suspected) exposure to covid-19: Secondary | ICD-10-CM | POA: Diagnosis not present

## 2021-01-21 DIAGNOSIS — N1832 Chronic kidney disease, stage 3b: Secondary | ICD-10-CM | POA: Diagnosis not present

## 2021-01-21 DIAGNOSIS — I472 Ventricular tachycardia: Secondary | ICD-10-CM | POA: Diagnosis not present

## 2021-01-21 DIAGNOSIS — I420 Dilated cardiomyopathy: Secondary | ICD-10-CM | POA: Diagnosis not present

## 2021-01-21 DIAGNOSIS — I5022 Chronic systolic (congestive) heart failure: Secondary | ICD-10-CM | POA: Diagnosis not present

## 2021-01-21 DIAGNOSIS — E039 Hypothyroidism, unspecified: Secondary | ICD-10-CM | POA: Diagnosis not present

## 2021-01-21 DIAGNOSIS — Z0000 Encounter for general adult medical examination without abnormal findings: Secondary | ICD-10-CM | POA: Diagnosis not present

## 2021-01-21 DIAGNOSIS — I442 Atrioventricular block, complete: Secondary | ICD-10-CM | POA: Diagnosis not present

## 2021-01-21 DIAGNOSIS — Z1389 Encounter for screening for other disorder: Secondary | ICD-10-CM | POA: Diagnosis not present

## 2021-01-21 DIAGNOSIS — I480 Paroxysmal atrial fibrillation: Secondary | ICD-10-CM | POA: Diagnosis not present

## 2021-01-25 ENCOUNTER — Ambulatory Visit (INDEPENDENT_AMBULATORY_CARE_PROVIDER_SITE_OTHER): Payer: Medicare PPO

## 2021-01-25 DIAGNOSIS — I5022 Chronic systolic (congestive) heart failure: Secondary | ICD-10-CM

## 2021-01-25 DIAGNOSIS — Z95810 Presence of automatic (implantable) cardiac defibrillator: Secondary | ICD-10-CM

## 2021-01-27 ENCOUNTER — Ambulatory Visit: Payer: Medicare PPO | Attending: Otolaryngology | Admitting: Audiologist

## 2021-01-27 ENCOUNTER — Telehealth (INDEPENDENT_AMBULATORY_CARE_PROVIDER_SITE_OTHER): Payer: Self-pay | Admitting: Otolaryngology

## 2021-01-27 ENCOUNTER — Other Ambulatory Visit: Payer: Self-pay

## 2021-01-27 DIAGNOSIS — H9192 Unspecified hearing loss, left ear: Secondary | ICD-10-CM

## 2021-01-27 NOTE — Progress Notes (Signed)
EPIC Encounter for ICM Monitoring  Patient Name: SHARNETTE KITAMURA is a 70 y.o. female Date: 01/27/2021 Primary Care Physican: [name], MD Primary Cardiologist: [name] Electrophysiologist: Allred Bi-V Pacing:  98%    3/16/2022Weight: 127-129 lbs         Spoke with patient and reports feeling well at this time.  Denies fluid symptoms.     Optivol thoracic impedance normal.   Prescribed:  Furosemide 40 mg take 1 tablet by mouth daily as needed. May take extra 1/2 tablet as needed for swelling. Spironolactone 25 mg take 0.5 tablet (12.5 mg total) daily   Labs: 01/13/2021 Creatinine 1.87, BUN 18, Potassium 4.3, Sodium 140, GFR 29 10/14/2020 Creatinine 1.76, BUN 17, Potassium 4.4, Sodium 140, GFR 31 A complete set of results can be found in Results Review.   Recommendations:  No changes and encouraged to call if experiencing any fluid symptoms.   Follow-up plan: ICM clinic phone appointment on 03/01/2021.   91 day device clinic remote transmission 03/05/2021.     EP/Cardiology Office Visits:  Recall 12/24/2021 with Oda Kilts, PA.  Recall 07/12/2021 with Dr Haroldine Laws.   Copy of ICM check sent to Dr. Rayann Heman.   3 month ICM trend: 01/25/2021.    1 Year ICM trend:       [name], RN 01/27/2021 12:46 PM

## 2021-01-27 NOTE — Procedures (Signed)
Outpatient Audiology and [place], [place]  [address] [phone]  AUDIOLOGICAL  EVALUATION  NAME: Kristi Parker     DOB:   [birth year]      MRN: [id number]                                                                                     DATE: 01/27/2021     REFERENT: [name], MD STATUS: Outpatient DIAGNOSIS: Sensorineural Left Ear    History: Kristi Parker was seen for an audiological evaluation. Kristi Parker was accompanied to the appointment by her husband.  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 Kristi Parker says she cannot hear anything from her left ear, she is deaf in that ear ever since she suddenly lost her hering in 2011. Since then she has relied on her right ear. She started feeling like her right ear hearing was deteriorating. She saw her PCP who removed wax from the right ear and her hearing improved. She then had a hearing test at [facility]. They recommended she be evaluated by an ENT Physician so she was referred to Dr. Lucia Gaskins. Dr. Lucia Gaskins recommended a hearing test at the [facility]. Kristi Parker brought her audiogram from her 2011 test, it was scanned today under media. It shows profound hearing loss in the left ear with no word recognition ability and normal hearing in the right ear.   Evaluation:  Otoscopy showed a clear view of the tympanic membranes, bilaterally Tympanometry results were consistent with normal middle ear function bilaterally   Audiometric testing was completed using conventional audiometry with insert transducer. Speech Recognition Thresholds were consistent with pure tone averages. Word Recognition was excellent at conversation level in the right ear. Pure tone thresholds show normal hearing in the right ear. Left ear thresholds show profound sensorineural  hearing loss. At 105dB patient could feel the speech testing but  did not hear a voice. No useable hearing in left ear for understanding of speech.  Patient often reported vibrotactile sensations not  actually hearing the tones or speech. No response to pure tones 250-8k Hz.   Results:  The test results were reviewed with Kristi Parker. She does not need a hearing aid in the left ear because she does not have any useable hearing in that ear. She feels she gets by ok with one ear but gets frustrated at the gym or church when people try to talk to her from the left. I recommended a trial period of a Cros System. This will pick up sound from the left side and route it to the good ear. This device looks like a hearing aid but it less expensive and will give her access to sound from her left side. She was given a handout of local providers who would likely work with Kristi Parker and a handout showing how a Cros works. Next step will be for her to see Dr. Lucia Gaskins, an ENT, for medical clearance.   Recommendations: 1.   No useable hearing in left ear. Recommend a Cros System to compensate for single sided deafness. See handout.  2.  Follow up with Dr. Lucia Gaskins for medical evaluation.    Alfonse Alpers  Audiologist, Au.D., CCC-A 01/27/2021  11:41 AM  Cc: [name], MD

## 2021-01-27 NOTE — Telephone Encounter (Signed)
Patient arrived to clinic to drop off records for Dr. Lucia Gaskins to review. She is requesting a call back to confirm records has been reviewed and discuss appointment.  Pt call back number is [phone]

## 2021-01-28 ENCOUNTER — Telehealth (INDEPENDENT_AMBULATORY_CARE_PROVIDER_SITE_OTHER): Payer: Self-pay | Admitting: Otolaryngology

## 2021-01-28 NOTE — Telephone Encounter (Signed)
Called patient concerning results of her recent hearing test performed at [facility].  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  The hearing in the right ear was essentially normal.  She had type A tympanograms bilaterally. The audiologist had recommended a Cros aid to help benefit hearing on the left side and I agree with this. I briefly reviewed with her concerning cochlear implant in the left ear but she is not interested in this. She also brought records from her visit at [facility] from August 2011 when she initially developed her sudden onset hearing loss.  She was treated with steroids for 2 weeks without any improvement of her hearing.  Hearing test at that time resembled the hearing test that was recently performed. She will follow-up here as needed if she notices any change in hearing on the right side.

## 2021-03-01 ENCOUNTER — Ambulatory Visit (INDEPENDENT_AMBULATORY_CARE_PROVIDER_SITE_OTHER): Payer: Medicare PPO

## 2021-03-01 DIAGNOSIS — Z1231 Encounter for screening mammogram for malignant neoplasm of breast: Secondary | ICD-10-CM | POA: Diagnosis not present

## 2021-03-01 DIAGNOSIS — I5022 Chronic systolic (congestive) heart failure: Secondary | ICD-10-CM | POA: Diagnosis not present

## 2021-03-01 DIAGNOSIS — Z95810 Presence of automatic (implantable) cardiac defibrillator: Secondary | ICD-10-CM

## 2021-03-05 ENCOUNTER — Ambulatory Visit (INDEPENDENT_AMBULATORY_CARE_PROVIDER_SITE_OTHER): Payer: Medicare PPO

## 2021-03-05 DIAGNOSIS — I428 Other cardiomyopathies: Secondary | ICD-10-CM

## 2021-03-05 NOTE — Progress Notes (Signed)
EPIC Encounter for ICM Monitoring  Patient Name: Kristi Parker is a 70 y.o. female Date: 03/05/2021 Primary Care Physican: [name], MD Primary Cardiologist: [name] Electrophysiologist: Allred Bi-V Pacing:  98.8%    7/22/2022Weight: 127-129 lbs         Spoke with patient and reports feeling well at this time.  Denies fluid symptoms.  Patient currently vacationing in [place].     Optivol thoracic impedance normal.   Prescribed:  Furosemide 40 mg take 1 tablet by mouth daily as needed. May take extra 1/2 tablet as needed for swelling. Spironolactone 25 mg take 0.5 tablet (12.5 mg total) daily   Labs: 01/13/2021 Creatinine 1.87, BUN 18, Potassium 4.3, Sodium 140, GFR 29 10/14/2020 Creatinine 1.76, BUN 17, Potassium 4.4, Sodium 140, GFR 31 A complete set of results can be found in Results Review.   Recommendations:  No changes and encouraged to call if experiencing any fluid symptoms.   Follow-up plan: ICM clinic phone appointment on 04/05/2021.  91 day device clinic remote transmission 06/04/2021.     EP/Cardiology Office Visits:  Recall 12/24/2021 with Oda Kilts, PA.  Recall 07/12/2021 with Dr Haroldine Laws.   Copy of ICM check sent to Dr. Rayann Heman.    3 month ICM trend: 03/05/2021.    1 Year ICM trend:       [name], RN 03/05/2021 4:06 PM

## 2021-03-08 LAB — CUP PACEART REMOTE DEVICE CHECK
Battery Remaining Longevity: 18 mo
Battery Voltage: 2.91 V
Brady Statistic AP VP Percent: 99.88 %
Brady Statistic AP VS Percent: 0.11 %
Brady Statistic AS VP Percent: 0 %
Brady Statistic AS VS Percent: 0 %
Brady Statistic RA Percent Paced: 100 %
Brady Statistic RV Percent Paced: 98.77 %
Date Time Interrogation Session: 20220722022504
HighPow Impedance: 37 Ohm
Implantable Lead Implant Date: 20091104
Implantable Lead Implant Date: 20091104
Implantable Lead Implant Date: 20171214
Implantable Lead Location: 753858
Implantable Lead Location: 753859
Implantable Lead Location: 753860
Implantable Lead Model: 4196
Implantable Lead Model: 5076
Implantable Pulse Generator Implant Date: 20171214
Lead Channel Impedance Value: 1140 Ohm
Lead Channel Impedance Value: 399 Ohm
Lead Channel Impedance Value: 418 Ohm
Lead Channel Impedance Value: 475 Ohm
Lead Channel Impedance Value: 532 Ohm
Lead Channel Impedance Value: 722 Ohm
Lead Channel Pacing Threshold Amplitude: 0.5 V
Lead Channel Pacing Threshold Amplitude: 0.625 V
Lead Channel Pacing Threshold Amplitude: 3 V
Lead Channel Pacing Threshold Pulse Width: 0.4 ms
Lead Channel Pacing Threshold Pulse Width: 0.4 ms
Lead Channel Pacing Threshold Pulse Width: 0.6 ms
Lead Channel Sensing Intrinsic Amplitude: 0.125 mV
Lead Channel Sensing Intrinsic Amplitude: 0.125 mV
Lead Channel Sensing Intrinsic Amplitude: 7.625 mV
Lead Channel Sensing Intrinsic Amplitude: 7.625 mV
Lead Channel Setting Pacing Amplitude: 2 V
Lead Channel Setting Pacing Amplitude: 2.5 V
Lead Channel Setting Pacing Amplitude: 4.5 V
Lead Channel Setting Pacing Pulse Width: 0.4 ms
Lead Channel Setting Pacing Pulse Width: 0.6 ms
Lead Channel Setting Sensing Sensitivity: 0.3 mV

## 2021-03-18 ENCOUNTER — Encounter (INDEPENDENT_AMBULATORY_CARE_PROVIDER_SITE_OTHER): Payer: Self-pay

## 2021-03-23 ENCOUNTER — Other Ambulatory Visit: Payer: Self-pay | Admitting: Cardiology

## 2021-03-29 NOTE — Progress Notes (Signed)
Remote ICD transmission.   

## 2021-04-05 ENCOUNTER — Ambulatory Visit (INDEPENDENT_AMBULATORY_CARE_PROVIDER_SITE_OTHER): Payer: Medicare PPO

## 2021-04-05 DIAGNOSIS — Z95810 Presence of automatic (implantable) cardiac defibrillator: Secondary | ICD-10-CM | POA: Diagnosis not present

## 2021-04-05 DIAGNOSIS — I5022 Chronic systolic (congestive) heart failure: Secondary | ICD-10-CM

## 2021-04-07 NOTE — Progress Notes (Signed)
EPIC Encounter for ICM Monitoring  Patient Name: Kristi Parker is a 70 y.o. female Date: 04/07/2021 Primary Care Physican: [name], MD Primary Cardiologist: [name] Electrophysiologist: Allred Bi-V Pacing:  98%    7/22/2022Weight: 127-129 lbs         Spoke with patient and reports feeling well at this time.  Denies fluid symptoms.  She took extra Furosemide during decreased impedance on 03/28/2021.   Optivol thoracic impedance normal.   Prescribed:  Furosemide 40 mg take 1 tablet by mouth daily as needed. May take extra 1/2 tablet as needed for swelling. Spironolactone 25 mg take 0.5 tablet (12.5 mg total) daily   Labs: 01/13/2021 Creatinine 1.87, BUN 18, Potassium 4.3, Sodium 140, GFR 29 10/14/2020 Creatinine 1.76, BUN 17, Potassium 4.4, Sodium 140, GFR 31 A complete set of results can be found in Results Review.   Recommendations:  No changes and encouraged to call if experiencing any fluid symptoms.   Follow-up plan: ICM clinic phone appointment on  05/17/2021.  91 day device clinic remote transmission 06/04/2021.     EP/Cardiology Office Visits:  Recall 12/24/2021 with Oda Kilts, PA.  Recall 07/12/2021 with Dr Haroldine Laws.   Copy of ICM check sent to Dr. Rayann Heman.     3 month ICM trend: 04/05/2021.    1 Year ICM trend:       [name], RN 04/07/2021 4:22 PM

## 2021-05-17 ENCOUNTER — Ambulatory Visit (INDEPENDENT_AMBULATORY_CARE_PROVIDER_SITE_OTHER): Payer: Medicare PPO

## 2021-05-17 DIAGNOSIS — Z95810 Presence of automatic (implantable) cardiac defibrillator: Secondary | ICD-10-CM

## 2021-05-17 DIAGNOSIS — I5022 Chronic systolic (congestive) heart failure: Secondary | ICD-10-CM

## 2021-05-20 ENCOUNTER — Encounter (INDEPENDENT_AMBULATORY_CARE_PROVIDER_SITE_OTHER): Payer: Self-pay

## 2021-05-21 NOTE — Progress Notes (Signed)
EPIC Encounter for ICM Monitoring  Patient Name: Kristi Parker is a 70 y.o. female Date: 05/21/2021 Primary Care Physican: [name], MD Primary Cardiologist: [name] Electrophysiologist: Allred Bi-V Pacing:  95.2%    Last Weight: 127-129 lbs         Spoke with patient and reports feeling well at this time.  Denies fluid symptoms.     Optivol thoracic impedance normal.   Prescribed:  Furosemide 40 mg take 1 tablet by mouth daily as needed. May take extra 1/2 tablet as needed for swelling. Spironolactone 25 mg take 0.5 tablet (12.5 mg total) daily   Labs: 01/13/2021 Creatinine 1.87, BUN 18, Potassium 4.3, Sodium 140, GFR 29 10/14/2020 Creatinine 1.76, BUN 17, Potassium 4.4, Sodium 140, GFR 31 A complete set of results can be found in Results Review.   Recommendations:  No changes and encouraged to call if experiencing any fluid symptoms.   Follow-up plan: ICM clinic phone appointment on 06/21/2021.  91 day device clinic remote transmission 06/04/2021.     EP/Cardiology Office Visits:  Recall 12/24/2021 with Oda Kilts, PA.  Recall 07/12/2021 with Dr Haroldine Laws.   Copy of ICM check sent to Dr. Rayann Heman.     3 month ICM trend: 05/17/2021.    1 Year ICM trend:       [name], RN 05/21/2021 3:11 PM

## 2021-06-04 ENCOUNTER — Ambulatory Visit (INDEPENDENT_AMBULATORY_CARE_PROVIDER_SITE_OTHER): Payer: Medicare PPO

## 2021-06-04 DIAGNOSIS — I428 Other cardiomyopathies: Secondary | ICD-10-CM | POA: Diagnosis not present

## 2021-06-06 LAB — CUP PACEART REMOTE DEVICE CHECK
Battery Remaining Longevity: 17 mo
Battery Voltage: 2.9 V
Brady Statistic AP VP Percent: 96.99 %
Brady Statistic AP VS Percent: 3.01 %
Brady Statistic AS VP Percent: 0 %
Brady Statistic AS VS Percent: 0 %
Brady Statistic RA Percent Paced: 99.99 %
Brady Statistic RV Percent Paced: 92.92 %
Date Time Interrogation Session: 20221021012203
HighPow Impedance: 40 Ohm
Implantable Lead Implant Date: 20091104
Implantable Lead Implant Date: 20091104
Implantable Lead Implant Date: 20171214
Implantable Lead Location: 753858
Implantable Lead Location: 753859
Implantable Lead Location: 753860
Implantable Lead Model: 4196
Implantable Lead Model: 5076
Implantable Pulse Generator Implant Date: 20171214
Lead Channel Impedance Value: 1121 Ohm
Lead Channel Impedance Value: 399 Ohm
Lead Channel Impedance Value: 418 Ohm
Lead Channel Impedance Value: 475 Ohm
Lead Channel Impedance Value: 532 Ohm
Lead Channel Impedance Value: 665 Ohm
Lead Channel Pacing Threshold Amplitude: 0.5 V
Lead Channel Pacing Threshold Amplitude: 0.625 V
Lead Channel Pacing Threshold Amplitude: 3 V
Lead Channel Pacing Threshold Pulse Width: 0.4 ms
Lead Channel Pacing Threshold Pulse Width: 0.4 ms
Lead Channel Pacing Threshold Pulse Width: 0.6 ms
Lead Channel Sensing Intrinsic Amplitude: 0.125 mV
Lead Channel Sensing Intrinsic Amplitude: 0.125 mV
Lead Channel Sensing Intrinsic Amplitude: 7.875 mV
Lead Channel Sensing Intrinsic Amplitude: 7.875 mV
Lead Channel Setting Pacing Amplitude: 2 V
Lead Channel Setting Pacing Amplitude: 2.5 V
Lead Channel Setting Pacing Amplitude: 4.5 V
Lead Channel Setting Pacing Pulse Width: 0.4 ms
Lead Channel Setting Pacing Pulse Width: 0.6 ms
Lead Channel Setting Sensing Sensitivity: 0.3 mV

## 2021-06-14 NOTE — Progress Notes (Signed)
Remote ICD transmission.   

## 2021-06-21 ENCOUNTER — Ambulatory Visit (INDEPENDENT_AMBULATORY_CARE_PROVIDER_SITE_OTHER): Payer: Medicare PPO

## 2021-06-21 DIAGNOSIS — I5022 Chronic systolic (congestive) heart failure: Secondary | ICD-10-CM

## 2021-06-21 DIAGNOSIS — Z95810 Presence of automatic (implantable) cardiac defibrillator: Secondary | ICD-10-CM

## 2021-06-25 ENCOUNTER — Telehealth: Payer: Self-pay

## 2021-06-25 NOTE — Telephone Encounter (Signed)
Remote ICM transmission received.  Attempted call to patient regarding ICM remote transmission and left detailed message per DPR.  Advised to return call for any fluid symptoms or questions. Next ICM remote transmission scheduled 07/26/2021.

## 2021-06-25 NOTE — Progress Notes (Signed)
EPIC Encounter for ICM Monitoring  Patient Name: Kristi Parker is a 70 y.o. female Date: 06/25/2021 Primary Care Physican: [name], MD Primary Cardiologist: [name] Electrophysiologist: Allred Bi-V Pacing:  93.3%    Last Weight: 127-129 lbs         Attempted call to patient and unable to reach.  Left detailed message per DPR regarding transmission. Transmission reviewed.    Optivol thoracic impedance normal.   Prescribed:  Furosemide 40 mg take 1 tablet by mouth daily as needed. May take extra 1/2 tablet as needed for swelling. Spironolactone 25 mg take 0.5 tablet (12.5 mg total) daily   Labs: 01/13/2021 Creatinine 1.87, BUN 18, Potassium 4.3, Sodium 140, GFR 29 10/14/2020 Creatinine 1.76, BUN 17, Potassium 4.4, Sodium 140, GFR 31 A complete set of results can be found in Results Review.   Recommendations:  Left voice mail with ICM number and encouraged to call if experiencing any fluid symptoms.   Follow-up plan: ICM clinic phone appointment on 07/26/2021.  91 day device clinic remote transmission 09/03/2021.     EP/Cardiology Office Visits:  Recall 12/24/2021 with Oda Kilts, PA.  Recall 07/12/2021 with Dr Haroldine Laws.   Copy of ICM check sent to Dr. Rayann Heman.      3 month ICM trend: 06/21/2021.    1 Year ICM trend:       [name], RN 06/25/2021 2:26 PM

## 2021-07-10 ENCOUNTER — Other Ambulatory Visit (HOSPITAL_COMMUNITY): Payer: Self-pay | Admitting: Internal Medicine

## 2021-07-26 ENCOUNTER — Ambulatory Visit (INDEPENDENT_AMBULATORY_CARE_PROVIDER_SITE_OTHER): Payer: Medicare PPO

## 2021-07-26 DIAGNOSIS — Z95810 Presence of automatic (implantable) cardiac defibrillator: Secondary | ICD-10-CM

## 2021-07-26 DIAGNOSIS — I5022 Chronic systolic (congestive) heart failure: Secondary | ICD-10-CM

## 2021-07-27 DIAGNOSIS — Z20822 Contact with and (suspected) exposure to covid-19: Secondary | ICD-10-CM | POA: Diagnosis not present

## 2021-07-27 NOTE — Progress Notes (Signed)
EPIC Encounter for ICM Monitoring  Patient Name: Kristi Parker is a 70 y.o. female Date: 07/27/2021 Primary Care Physican: [name], MD Primary Cardiologist: [name] Electrophysiologist: Allred Bi-V Pacing:  94.5%    Last Weight: 127-129 lbs         Spoke with patient and heart failure questions reviewed.  Pt asymptomatic for fluid accumulation and feeling well.   Optivol thoracic impedance trending slightly below baseline normal.   Prescribed:  Furosemide 40 mg take 1 tablet by mouth daily as needed. May take extra 1/2 tablet as needed for swelling. Spironolactone 25 mg take 0.5 tablet (12.5 mg total) daily   Labs: 01/13/2021 Creatinine 1.87, BUN 18, Potassium 4.3, Sodium 140, GFR 29 10/14/2020 Creatinine 1.76, BUN 17, Potassium 4.4, Sodium 140, GFR 31 A complete set of results can be found in Results Review.   Recommendations:  Advised to take 0.5 Furosemide tablet x 1 dose for possible minimal fluid accumulation.   Follow-up plan: ICM clinic phone appointment on 08/30/2021.  91 day device clinic remote transmission 09/03/2021.     EP/Cardiology Office Visits:  Recall 12/24/2021 with Oda Kilts, PA.  Recall 07/12/2021 with Dr Haroldine Laws.   Copy of ICM check sent to Dr. Rayann Heman.  3 month ICM trend: 07/26/2021.    12-14 Month ICM trend:       [name], RN 07/27/2021 12:18 PM

## 2021-08-30 ENCOUNTER — Ambulatory Visit (INDEPENDENT_AMBULATORY_CARE_PROVIDER_SITE_OTHER): Payer: Medicare PPO

## 2021-08-30 DIAGNOSIS — I5022 Chronic systolic (congestive) heart failure: Secondary | ICD-10-CM | POA: Diagnosis not present

## 2021-08-30 DIAGNOSIS — Z95810 Presence of automatic (implantable) cardiac defibrillator: Secondary | ICD-10-CM

## 2021-09-02 NOTE — Progress Notes (Signed)
EPIC Encounter for ICM Monitoring  Patient Name: Kristi Parker is a 71 y.o. female Date: 09/02/2021 Primary Care Physican: [name], MD Primary Cardiologist: [name] Electrophysiologist: Allred Bi-V Pacing:  95.2%    09/02/2021 Weight:  120.2 lbs         Spoke with patient and heart failure questions reviewed.  Pt asymptomatic for fluid accumulation.  Reports feeling well at this time and voices no complaints.    Optivol thoracic impedance suggesting normal fluid levels.    Prescribed:  Furosemide 40 mg take 1 tablet by mouth daily as needed. May take extra 1/2 tablet as needed for swelling. Spironolactone 25 mg take 0.5 tablet (12.5 mg total) daily   Labs: 01/13/2021 Creatinine 1.87, BUN 18, Potassium 4.3, Sodium 140, GFR 29 10/14/2020 Creatinine 1.76, BUN 17, Potassium 4.4, Sodium 140, GFR 31 A complete set of results can be found in Results Review.   Recommendations:  No changes and encouraged to call if experiencing any fluid symptoms.   Follow-up plan: ICM clinic phone appointment on 10/04/2021.  91 day device clinic remote transmission 09/03/2021.     EP/Cardiology Office Visits:  Recall 12/24/2021 with Oda Kilts, PA.  Recall 07/12/2021 with Dr Haroldine Laws.   Copy of ICM check sent to Dr. Rayann Heman.  3 month ICM trend: 08/30/2021.    12-14 Month ICM trend:     [name], RN 09/02/2021 12:16 PM

## 2021-09-03 ENCOUNTER — Ambulatory Visit (INDEPENDENT_AMBULATORY_CARE_PROVIDER_SITE_OTHER): Payer: Medicare PPO

## 2021-09-03 DIAGNOSIS — I428 Other cardiomyopathies: Secondary | ICD-10-CM | POA: Diagnosis not present

## 2021-09-03 LAB — CUP PACEART REMOTE DEVICE CHECK
Battery Remaining Longevity: 14 mo
Battery Voltage: 2.88 V
Brady Statistic AP VP Percent: 97.66 %
Brady Statistic AP VS Percent: 2.34 %
Brady Statistic AS VP Percent: 0 %
Brady Statistic AS VS Percent: 0 %
Brady Statistic RA Percent Paced: 99.99 %
Brady Statistic RV Percent Paced: 95.49 %
Date Time Interrogation Session: 20230120001707
HighPow Impedance: 41 Ohm
Implantable Lead Implant Date: 20091104
Implantable Lead Implant Date: 20091104
Implantable Lead Implant Date: 20171214
Implantable Lead Location: 753858
Implantable Lead Location: 753859
Implantable Lead Location: 753860
Implantable Lead Model: 4196
Implantable Lead Model: 5076
Implantable Pulse Generator Implant Date: 20171214
Lead Channel Impedance Value: 1007 Ohm
Lead Channel Impedance Value: 399 Ohm
Lead Channel Impedance Value: 399 Ohm
Lead Channel Impedance Value: 456 Ohm
Lead Channel Impedance Value: 513 Ohm
Lead Channel Impedance Value: 608 Ohm
Lead Channel Pacing Threshold Amplitude: 0.5 V
Lead Channel Pacing Threshold Amplitude: 0.625 V
Lead Channel Pacing Threshold Amplitude: 2.75 V
Lead Channel Pacing Threshold Pulse Width: 0.4 ms
Lead Channel Pacing Threshold Pulse Width: 0.4 ms
Lead Channel Pacing Threshold Pulse Width: 0.6 ms
Lead Channel Sensing Intrinsic Amplitude: 0.125 mV
Lead Channel Sensing Intrinsic Amplitude: 0.125 mV
Lead Channel Sensing Intrinsic Amplitude: 6.625 mV
Lead Channel Sensing Intrinsic Amplitude: 6.625 mV
Lead Channel Setting Pacing Amplitude: 2 V
Lead Channel Setting Pacing Amplitude: 2.5 V
Lead Channel Setting Pacing Amplitude: 4.5 V
Lead Channel Setting Pacing Pulse Width: 0.4 ms
Lead Channel Setting Pacing Pulse Width: 0.6 ms
Lead Channel Setting Sensing Sensitivity: 0.3 mV

## 2021-09-16 NOTE — Progress Notes (Signed)
Remote ICD transmission.   

## 2021-09-28 ENCOUNTER — Other Ambulatory Visit: Payer: Self-pay

## 2021-09-28 ENCOUNTER — Ambulatory Visit: Payer: Medicare PPO | Admitting: Podiatry

## 2021-09-28 DIAGNOSIS — L600 Ingrowing nail: Secondary | ICD-10-CM

## 2021-09-28 DIAGNOSIS — B351 Tinea unguium: Secondary | ICD-10-CM

## 2021-09-28 DIAGNOSIS — L603 Nail dystrophy: Secondary | ICD-10-CM | POA: Diagnosis not present

## 2021-10-02 NOTE — Progress Notes (Signed)
Subjective: 71 year old female presents the office today for concerns of reoccurring ingrown toenail on the right side and concern the nails not growing becoming uncomfortable particular shoes.  She is been using Dr. Lorrin Mais foot cream without any help.  Denies any drainage or pus or any swelling.  Objective: AAO x3, NAD DP/PT pulses palpable bilaterally, CRT less than 3 seconds Nails continue to be mildly hypertrophic, dystrophic with discoloration.  Incurvation present to the nail border.  There is no edema, erythema or signs of infection.  No open lesions.  No pain with calf compression, swelling, warmth, erythema  Assessment: Ingrown toenail from onychomycosis  Plan: -All treatment options discussed with the patient including all alternatives, risks, complications.  -The ingrown toenail discussed partial nail avulsion.  I sharply debrided the nails with a complications of bleeding and sent this for culture, pathology to [facility] labs.  -Patient encouraged to call the office with any questions, concerns, change in symptoms.   Trula Slade DPM

## 2021-10-04 ENCOUNTER — Ambulatory Visit (INDEPENDENT_AMBULATORY_CARE_PROVIDER_SITE_OTHER): Payer: Medicare PPO

## 2021-10-04 DIAGNOSIS — I5022 Chronic systolic (congestive) heart failure: Secondary | ICD-10-CM

## 2021-10-04 DIAGNOSIS — Z950 Presence of cardiac pacemaker: Secondary | ICD-10-CM

## 2021-10-04 DIAGNOSIS — Z95810 Presence of automatic (implantable) cardiac defibrillator: Secondary | ICD-10-CM

## 2021-10-08 NOTE — Progress Notes (Signed)
EPIC Encounter for ICM Monitoring  Patient Name: Kristi Parker is a 71 y.o. female Date: 10/08/2021 Primary Care Physican: [name], MD Primary Cardiologist: [name] Electrophysiologist: Allred Bi-V Pacing:  95.2%    09/02/2021 Weight:  120.2 lbs         Spoke with patient and heart failure questions reviewed.  Pt asymptomatic for fluid accumulation.  Reports feeling well at this time and voices no complaints.    Optivol thoracic impedance suggesting normal fluid levels.    Prescribed:  Furosemide 40 mg take 1 tablet by mouth daily as needed. May take extra 1/2 tablet as needed for swelling. Spironolactone 25 mg take 0.5 tablet (12.5 mg total) daily   Labs: 01/13/2021 Creatinine 1.87, BUN 18, Potassium 4.3, Sodium 140, GFR 29 10/14/2020 Creatinine 1.76, BUN 17, Potassium 4.4, Sodium 140, GFR 31 A complete set of results can be found in Results Review.   Recommendations:  No changes and encouraged to call if experiencing any fluid symptoms.   Follow-up plan: ICM clinic phone appointment on 11/08/2021.  91 day device clinic remote transmission 12/03/2021.     EP/Cardiology Office Visits:  Recall 12/24/2021 with Oda Kilts, PA.   Recall 07/12/2021 with Dr Haroldine Laws and advised to call the office for the 6 month follow up.   Copy of ICM check sent to Dr. Rayann Heman.  3 month ICM trend: 10/04/2021.    12-14 Month ICM trend:     [name], RN 10/08/2021 2:18 PM

## 2021-10-20 ENCOUNTER — Encounter: Payer: Self-pay | Admitting: Podiatry

## 2021-11-01 ENCOUNTER — Telehealth: Payer: Self-pay | Admitting: *Deleted

## 2021-11-01 NOTE — Telephone Encounter (Signed)
Patient is calling to ask how long should she continue to use the nail gel. Please advise. ?

## 2021-11-03 NOTE — Telephone Encounter (Signed)
Patient has been notified of recommendations for nail gel,verbalized understanding

## 2021-11-08 ENCOUNTER — Ambulatory Visit (INDEPENDENT_AMBULATORY_CARE_PROVIDER_SITE_OTHER): Payer: Medicare PPO

## 2021-11-08 DIAGNOSIS — Z95810 Presence of automatic (implantable) cardiac defibrillator: Secondary | ICD-10-CM

## 2021-11-08 DIAGNOSIS — I5022 Chronic systolic (congestive) heart failure: Secondary | ICD-10-CM | POA: Diagnosis not present

## 2021-11-12 NOTE — Progress Notes (Signed)
EPIC Encounter for ICM Monitoring ? ?Patient Name: Kristi Parker is a 71 y.o. female ?Date: 11/12/2021 ?Primary Care Physican: [name], MD ?Primary Cardiologist: [name] ?Electrophysiologist: Allred ?Bi-V Pacing:  94.6%    ?09/02/2021 Weight: 120.2 lbs ?11/12/2021 Weight: 120 lbs ?  ?      Spoke with patient and heart failure questions reviewed.  Pt asymptomatic for fluid accumulation.  Reports feeling well at this time and voices no complaints.  ?  ?Optivol thoracic impedance suggesting normal fluid levels.  ?  ?Prescribed:  ?Furosemide 40 mg take 1 tablet by mouth daily as needed. May take extra 1/2 tablet as needed for swelling. ?Spironolactone 25 mg take 0.5 tablet (12.5 mg total) daily ?  ?Labs: ?01/13/2021 Creatinine 1.87, BUN 18, Potassium 4.3, Sodium 140, GFR 29 ?10/14/2020 Creatinine 1.76, BUN 17, Potassium 4.4, Sodium 140, GFR 31 ?A complete set of results can be found in Results Review. ?  ?Recommendations:  No changes and encouraged to call if experiencing any fluid symptoms. ?  ?Follow-up plan: ICM clinic phone appointment on 12/13/2021.  91 day device clinic remote transmission 12/03/2021.   ?  ?EP/Cardiology Office Visits:  Recall 12/24/2021 with Oda Kilts, PA.   Recall 07/12/2021 with Dr Haroldine Laws and advised to call the office for the 6 month follow up. ?  ?Copy of ICM check sent to Dr. Rayann Heman.  ? ?3 month ICM trend: 11/12/2021. ? ? ? ?12-14 Month ICM trend:  ? ? ? ?[name], RN ?11/12/2021 ?1:30 PM ? ?

## 2021-11-19 ENCOUNTER — Other Ambulatory Visit (HOSPITAL_COMMUNITY): Payer: Self-pay | Admitting: Family Medicine

## 2021-12-03 ENCOUNTER — Ambulatory Visit (INDEPENDENT_AMBULATORY_CARE_PROVIDER_SITE_OTHER): Payer: Medicare PPO

## 2021-12-03 DIAGNOSIS — I442 Atrioventricular block, complete: Secondary | ICD-10-CM | POA: Diagnosis not present

## 2021-12-03 LAB — CUP PACEART REMOTE DEVICE CHECK
Battery Remaining Longevity: 12 mo
Battery Voltage: 2.87 V
Brady Statistic AP VP Percent: 97.08 %
Brady Statistic AP VS Percent: 2.92 %
Brady Statistic AS VP Percent: 0 %
Brady Statistic AS VS Percent: 0 %
Brady Statistic RA Percent Paced: 100 %
Brady Statistic RV Percent Paced: 95.47 %
Date Time Interrogation Session: 20230421022603
HighPow Impedance: 37 Ohm
Implantable Lead Implant Date: 20091104
Implantable Lead Implant Date: 20091104
Implantable Lead Implant Date: 20171214
Implantable Lead Location: 753858
Implantable Lead Location: 753859
Implantable Lead Location: 753860
Implantable Lead Model: 4196
Implantable Lead Model: 5076
Implantable Pulse Generator Implant Date: 20171214
Lead Channel Impedance Value: 1083 Ohm
Lead Channel Impedance Value: 399 Ohm
Lead Channel Impedance Value: 418 Ohm
Lead Channel Impedance Value: 456 Ohm
Lead Channel Impedance Value: 475 Ohm
Lead Channel Impedance Value: 703 Ohm
Lead Channel Pacing Threshold Amplitude: 0.5 V
Lead Channel Pacing Threshold Amplitude: 0.625 V
Lead Channel Pacing Threshold Amplitude: 2.75 V
Lead Channel Pacing Threshold Pulse Width: 0.4 ms
Lead Channel Pacing Threshold Pulse Width: 0.4 ms
Lead Channel Pacing Threshold Pulse Width: 0.6 ms
Lead Channel Sensing Intrinsic Amplitude: 0.125 mV
Lead Channel Sensing Intrinsic Amplitude: 0.125 mV
Lead Channel Sensing Intrinsic Amplitude: 6.875 mV
Lead Channel Sensing Intrinsic Amplitude: 6.875 mV
Lead Channel Setting Pacing Amplitude: 2 V
Lead Channel Setting Pacing Amplitude: 2.5 V
Lead Channel Setting Pacing Amplitude: 4.25 V
Lead Channel Setting Pacing Pulse Width: 0.4 ms
Lead Channel Setting Pacing Pulse Width: 0.6 ms
Lead Channel Setting Sensing Sensitivity: 0.3 mV

## 2021-12-14 ENCOUNTER — Other Ambulatory Visit (HOSPITAL_COMMUNITY): Payer: Self-pay | Admitting: Family Medicine

## 2021-12-14 ENCOUNTER — Other Ambulatory Visit: Payer: Self-pay | Admitting: Internal Medicine

## 2021-12-16 ENCOUNTER — Telehealth: Payer: Self-pay

## 2021-12-16 NOTE — Telephone Encounter (Signed)
I spoke with the patient and tried to help trouble shoot the monitor. I called tech support to get additional help. The patient is going to receive a new handheld in 7-10 business days. ?

## 2021-12-20 ENCOUNTER — Ambulatory Visit (INDEPENDENT_AMBULATORY_CARE_PROVIDER_SITE_OTHER): Payer: Medicare PPO

## 2021-12-20 ENCOUNTER — Other Ambulatory Visit: Payer: Self-pay

## 2021-12-20 DIAGNOSIS — I5022 Chronic systolic (congestive) heart failure: Secondary | ICD-10-CM | POA: Diagnosis not present

## 2021-12-20 DIAGNOSIS — Z95810 Presence of automatic (implantable) cardiac defibrillator: Secondary | ICD-10-CM

## 2021-12-20 NOTE — Progress Notes (Signed)
EPIC Encounter for ICM Monitoring ? ?Patient Name: Kristi Parker is a 71 y.o. female ?Date: 12/20/2021 ?Primary Care Physican: [name], MD ?Primary Cardiologist: [name] ?Electrophysiologist: Allred ?Bi-V Pacing:  96.9%    ?09/02/2021 Weight:  120.2 lbs ?  ?      Spoke with patient and heart failure questions reviewed.  Pt asymptomatic for fluid accumulation.  Reports feeling well at this time and voices no complaints.  ?  ?Optivol thoracic impedance suggesting normal fluid levels.  ?  ?Prescribed:  ?Furosemide 40 mg take 1 tablet by mouth daily as needed. May take extra 1/2 tablet as needed for swelling. ?Spironolactone 25 mg take 0.5 tablet (12.5 mg total) daily ?  ?Labs: ?01/13/2021 Creatinine 1.87, BUN 18, Potassium 4.3, Sodium 140, GFR 29 ?10/14/2020 Creatinine 1.76, BUN 17, Potassium 4.4, Sodium 140, GFR 31 ?A complete set of results can be found in Results Review. ?  ?Recommendations:  No changes and encouraged to call if experiencing any fluid symptoms. ?  ?Follow-up plan: ICM clinic phone appointment on 01/24/2022.  91 day device clinic remote transmission 03/04/2022.   ?  ?EP/Cardiology Office Visits:  Recall 12/24/2021 with Oda Kilts, PA.   Recall 07/12/2021 with Dr Haroldine Laws. ?  ?Copy of ICM check sent to Dr. Rayann Heman. ? ?3 month ICM trend: 12/20/2021. ? ? ? ?12-14 Month ICM trend:  ? ? ? ?[name], RN ?12/20/2021 ?1:33 PM ? ?

## 2021-12-21 NOTE — Progress Notes (Signed)
Remote ICD transmission.   

## 2022-01-03 ENCOUNTER — Other Ambulatory Visit: Payer: Self-pay | Admitting: Internal Medicine

## 2022-01-03 ENCOUNTER — Other Ambulatory Visit (HOSPITAL_COMMUNITY): Payer: Self-pay | Admitting: *Deleted

## 2022-01-03 DIAGNOSIS — I5022 Chronic systolic (congestive) heart failure: Secondary | ICD-10-CM

## 2022-01-24 ENCOUNTER — Ambulatory Visit (INDEPENDENT_AMBULATORY_CARE_PROVIDER_SITE_OTHER): Payer: Medicare PPO

## 2022-01-24 DIAGNOSIS — Z95810 Presence of automatic (implantable) cardiac defibrillator: Secondary | ICD-10-CM | POA: Diagnosis not present

## 2022-01-24 DIAGNOSIS — I5022 Chronic systolic (congestive) heart failure: Secondary | ICD-10-CM

## 2022-01-27 ENCOUNTER — Other Ambulatory Visit (HOSPITAL_COMMUNITY): Payer: Self-pay | Admitting: Internal Medicine

## 2022-01-28 NOTE — Progress Notes (Signed)
EPIC Encounter for ICM Monitoring  Patient Name: Kristi Parker is a 71 y.o. female Date: 01/28/2022 Primary Care Physican: [name], MD Primary Cardiologist: [name] Electrophysiologist: Allred Bi-V Pacing:  90%    01/28/2022 Weight:  118 lbs         Spoke with patient and heart failure questions reviewed.  Pt asymptomatic for fluid accumulation.  Reports feeling well at this time and voices no complaints.    Optivol thoracic impedance suggesting normal fluid levels.    Prescribed:  Furosemide 40 mg take 1 tablet by mouth daily as needed. May take extra 1/2 tablet as needed for swelling. Spironolactone 25 mg take 0.5 tablet (12.5 mg total) daily   Labs: 01/13/2021 Creatinine 1.87, BUN 18, Potassium 4.3, Sodium 140, GFR 29 10/14/2020 Creatinine 1.76, BUN 17, Potassium 4.4, Sodium 140, GFR 31 A complete set of results can be found in Results Review.   Recommendations:  No changes and encouraged to call if experiencing any fluid symptoms.   Follow-up plan: ICM clinic phone appointment on 02/28/2022.  91 day device clinic remote transmission 03/04/2022.     EP/Cardiology Office Visits:  Recall 12/24/2021 with Oda Kilts, [place].   04/05/2021 with Dr Haroldine Laws.   Copy of ICM check sent to Dr. Rayann Heman.  3 month ICM trend: 01/24/2022.    12-14 Month ICM trend:     [name], RN 01/28/2022 3:09 PM

## 2022-02-08 ENCOUNTER — Other Ambulatory Visit (HOSPITAL_COMMUNITY): Payer: Self-pay | Admitting: Internal Medicine

## 2022-02-23 ENCOUNTER — Other Ambulatory Visit (HOSPITAL_COMMUNITY): Payer: Self-pay | Admitting: Internal Medicine

## 2022-02-28 ENCOUNTER — Ambulatory Visit (INDEPENDENT_AMBULATORY_CARE_PROVIDER_SITE_OTHER): Payer: Medicare PPO

## 2022-02-28 DIAGNOSIS — I5022 Chronic systolic (congestive) heart failure: Secondary | ICD-10-CM | POA: Diagnosis not present

## 2022-02-28 DIAGNOSIS — Z95810 Presence of automatic (implantable) cardiac defibrillator: Secondary | ICD-10-CM

## 2022-03-04 ENCOUNTER — Ambulatory Visit (INDEPENDENT_AMBULATORY_CARE_PROVIDER_SITE_OTHER): Payer: Medicare PPO

## 2022-03-04 DIAGNOSIS — I428 Other cardiomyopathies: Secondary | ICD-10-CM

## 2022-03-04 LAB — CUP PACEART REMOTE DEVICE CHECK
Battery Remaining Longevity: 9 mo
Battery Voltage: 2.85 V
Brady Statistic AP VP Percent: 96.55 %
Brady Statistic AP VS Percent: 3.45 %
Brady Statistic AS VP Percent: 0 %
Brady Statistic AS VS Percent: 0 %
Brady Statistic RA Percent Paced: 100 %
Brady Statistic RV Percent Paced: 92.79 %
Date Time Interrogation Session: 20230721043825
HighPow Impedance: 36 Ohm
Implantable Lead Implant Date: 20091104
Implantable Lead Implant Date: 20091104
Implantable Lead Implant Date: 20171214
Implantable Lead Location: 753858
Implantable Lead Location: 753859
Implantable Lead Location: 753860
Implantable Lead Model: 4196
Implantable Lead Model: 5076
Implantable Pulse Generator Implant Date: 20171214
Lead Channel Impedance Value: 342 Ohm
Lead Channel Impedance Value: 361 Ohm
Lead Channel Impedance Value: 418 Ohm
Lead Channel Impedance Value: 418 Ohm
Lead Channel Impedance Value: 608 Ohm
Lead Channel Impedance Value: 988 Ohm
Lead Channel Pacing Threshold Amplitude: 0.5 V
Lead Channel Pacing Threshold Amplitude: 0.625 V
Lead Channel Pacing Threshold Amplitude: 2.75 V
Lead Channel Pacing Threshold Pulse Width: 0.4 ms
Lead Channel Pacing Threshold Pulse Width: 0.4 ms
Lead Channel Pacing Threshold Pulse Width: 0.6 ms
Lead Channel Sensing Intrinsic Amplitude: 0.125 mV
Lead Channel Sensing Intrinsic Amplitude: 0.125 mV
Lead Channel Sensing Intrinsic Amplitude: 7.125 mV
Lead Channel Sensing Intrinsic Amplitude: 7.125 mV
Lead Channel Setting Pacing Amplitude: 2 V
Lead Channel Setting Pacing Amplitude: 2.5 V
Lead Channel Setting Pacing Amplitude: 4.25 V
Lead Channel Setting Pacing Pulse Width: 0.4 ms
Lead Channel Setting Pacing Pulse Width: 0.6 ms
Lead Channel Setting Sensing Sensitivity: 0.3 mV

## 2022-03-04 NOTE — Progress Notes (Signed)
EPIC Encounter for ICM Monitoring  Patient Name: Kristi Parker is a 71 y.o. female Date: 03/04/2022 Primary Care Physican: [name], MD Primary Cardiologist: [name] Electrophysiologist: Allred Bi-V Pacing:  92.8%    01/28/2022 Weight:  118 lbs 03/04/2022 Weight: 118-121 lbs         Spoke with patient and heart failure questions reviewed.  Pt asymptomatic for fluid accumulation.  Reports feeling well at this time and voices no complaints.   She takes Furosemide about 3 times a month for fluid symptoms of 2-3 lbs weight gain and tiredness.   Optivol thoracic impedance suggesting normal fluid levels.    Prescribed:  Furosemide 40 mg take 1 tablet by mouth daily as needed. May take extra 1/2 tablet as needed for swelling. Spironolactone 25 mg take 0.5 tablet (12.5 mg total) daily   Labs: 01/13/2021 Creatinine 1.87, BUN 18, Potassium 4.3, Sodium 140, GFR 29 10/14/2020 Creatinine 1.76, BUN 17, Potassium 4.4, Sodium 140, GFR 31 A complete set of results can be found in Results Review.   Recommendations:  No changes and encouraged to call if experiencing any fluid symptoms.   Follow-up plan: ICM clinic phone appointment on 04/04/2022.  91 day device clinic remote transmission 06/03/2022.     EP/Cardiology Office Visits:  Recall 12/24/2021 with Oda Kilts, [place].   04/05/2021 with Dr Haroldine Laws.   Copy of ICM check sent to Dr. Rayann Heman.   3 month ICM trend: 03/04/2022.    12-14 Month ICM trend:     [name], RN 03/04/2022 3:12 PM

## 2022-03-21 NOTE — Progress Notes (Signed)
Remote ICD transmission.   

## 2022-03-30 DIAGNOSIS — Z780 Asymptomatic menopausal state: Secondary | ICD-10-CM | POA: Diagnosis not present

## 2022-03-30 DIAGNOSIS — M85852 Other specified disorders of bone density and structure, left thigh: Secondary | ICD-10-CM | POA: Diagnosis not present

## 2022-03-30 DIAGNOSIS — Z1231 Encounter for screening mammogram for malignant neoplasm of breast: Secondary | ICD-10-CM | POA: Diagnosis not present

## 2022-03-30 DIAGNOSIS — M85851 Other specified disorders of bone density and structure, right thigh: Secondary | ICD-10-CM | POA: Diagnosis not present

## 2022-03-31 DIAGNOSIS — I5022 Chronic systolic (congestive) heart failure: Secondary | ICD-10-CM | POA: Diagnosis not present

## 2022-03-31 DIAGNOSIS — I472 Ventricular tachycardia, unspecified: Secondary | ICD-10-CM | POA: Diagnosis not present

## 2022-03-31 DIAGNOSIS — N1832 Chronic kidney disease, stage 3b: Secondary | ICD-10-CM | POA: Diagnosis not present

## 2022-03-31 DIAGNOSIS — Z1331 Encounter for screening for depression: Secondary | ICD-10-CM | POA: Diagnosis not present

## 2022-03-31 DIAGNOSIS — I420 Dilated cardiomyopathy: Secondary | ICD-10-CM | POA: Diagnosis not present

## 2022-03-31 DIAGNOSIS — I442 Atrioventricular block, complete: Secondary | ICD-10-CM | POA: Diagnosis not present

## 2022-03-31 DIAGNOSIS — D6869 Other thrombophilia: Secondary | ICD-10-CM | POA: Diagnosis not present

## 2022-03-31 DIAGNOSIS — Z0000 Encounter for general adult medical examination without abnormal findings: Secondary | ICD-10-CM | POA: Diagnosis not present

## 2022-03-31 DIAGNOSIS — Z23 Encounter for immunization: Secondary | ICD-10-CM | POA: Diagnosis not present

## 2022-03-31 DIAGNOSIS — D696 Thrombocytopenia, unspecified: Secondary | ICD-10-CM | POA: Diagnosis not present

## 2022-03-31 DIAGNOSIS — E039 Hypothyroidism, unspecified: Secondary | ICD-10-CM | POA: Diagnosis not present

## 2022-03-31 DIAGNOSIS — I480 Paroxysmal atrial fibrillation: Secondary | ICD-10-CM | POA: Diagnosis not present

## 2022-04-02 ENCOUNTER — Other Ambulatory Visit (HOSPITAL_COMMUNITY): Payer: Self-pay | Admitting: Internal Medicine

## 2022-04-04 ENCOUNTER — Ambulatory Visit (INDEPENDENT_AMBULATORY_CARE_PROVIDER_SITE_OTHER): Payer: Medicare PPO

## 2022-04-04 DIAGNOSIS — I5022 Chronic systolic (congestive) heart failure: Secondary | ICD-10-CM

## 2022-04-04 DIAGNOSIS — Z95810 Presence of automatic (implantable) cardiac defibrillator: Secondary | ICD-10-CM | POA: Diagnosis not present

## 2022-04-05 ENCOUNTER — Encounter (HOSPITAL_COMMUNITY): Payer: Self-pay | Admitting: Internal Medicine

## 2022-04-05 ENCOUNTER — Ambulatory Visit (HOSPITAL_COMMUNITY)
Admission: RE | Admit: 2022-04-05 | Discharge: 2022-04-05 | Disposition: A | Discharge status: Home / Self Care | Payer: Medicare PPO | Source: Ambulatory Visit | Attending: Internal Medicine | Admitting: Internal Medicine

## 2022-04-05 ENCOUNTER — Ambulatory Visit (HOSPITAL_BASED_OUTPATIENT_CLINIC_OR_DEPARTMENT_OTHER)
Admission: RE | Admit: 2022-04-05 | Discharge: 2022-04-05 | Disposition: A | Discharge status: Home / Self Care | Payer: Medicare PPO | Source: Ambulatory Visit | Attending: Internal Medicine | Admitting: Internal Medicine

## 2022-04-05 VITALS — BP 110/80 | HR 64 | Wt 122.8 lb

## 2022-04-05 DIAGNOSIS — Z8673 Personal history of transient ischemic attack (TIA), and cerebral infarction without residual deficits: Secondary | ICD-10-CM | POA: Insufficient documentation

## 2022-04-05 DIAGNOSIS — I5022 Chronic systolic (congestive) heart failure: Secondary | ICD-10-CM

## 2022-04-05 DIAGNOSIS — I428 Other cardiomyopathies: Secondary | ICD-10-CM | POA: Diagnosis not present

## 2022-04-05 DIAGNOSIS — N184 Chronic kidney disease, stage 4 (severe): Secondary | ICD-10-CM | POA: Diagnosis not present

## 2022-04-05 DIAGNOSIS — Z79899 Other long term (current) drug therapy: Secondary | ICD-10-CM | POA: Insufficient documentation

## 2022-04-05 DIAGNOSIS — Z4502 Encounter for adjustment and management of automatic implantable cardiac defibrillator: Secondary | ICD-10-CM | POA: Insufficient documentation

## 2022-04-05 DIAGNOSIS — I4821 Permanent atrial fibrillation: Secondary | ICD-10-CM | POA: Diagnosis not present

## 2022-04-05 DIAGNOSIS — Z7901 Long term (current) use of anticoagulants: Secondary | ICD-10-CM | POA: Diagnosis not present

## 2022-04-05 DIAGNOSIS — Z01818 Encounter for other preprocedural examination: Secondary | ICD-10-CM | POA: Diagnosis not present

## 2022-04-05 DIAGNOSIS — I480 Paroxysmal atrial fibrillation: Secondary | ICD-10-CM | POA: Diagnosis not present

## 2022-04-05 DIAGNOSIS — Z95810 Presence of automatic (implantable) cardiac defibrillator: Secondary | ICD-10-CM

## 2022-04-05 DIAGNOSIS — Z7984 Long term (current) use of oral hypoglycemic drugs: Secondary | ICD-10-CM | POA: Insufficient documentation

## 2022-04-05 LAB — ECHOCARDIOGRAM COMPLETE
Area-P 1/2: 5.54 cm2
Calc EF: 29.3 %
MV M vel: 4.56 m/s
MV Peak grad: 83.2 mmHg
Radius: 0.6 cm
S' Lateral: 5 cm
Single Plane A2C EF: 39.8 %
Single Plane A4C EF: 30.3 %

## 2022-04-05 LAB — COMPREHENSIVE METABOLIC PANEL
ALT: 24 U/L (ref 0–44)
AST: 16 U/L (ref 15–41)
Albumin: 4.1 g/dL (ref 3.5–5.0)
Alkaline Phosphatase: 163 U/L — ABNORMAL HIGH (ref 38–126)
Anion gap: 9 (ref 5–15)
BUN: 24 mg/dL — ABNORMAL HIGH (ref 8–23)
CO2: 25 mmol/L (ref 22–32)
Calcium: 10.1 mg/dL (ref 8.9–10.3)
Chloride: 105 mmol/L (ref 98–111)
Creatinine, Ser: 2.03 mg/dL — ABNORMAL HIGH (ref 0.44–1.00)
GFR, Estimated: 26 mL/min — ABNORMAL LOW (ref >60)
Glucose, Bld: 92 mg/dL (ref 70–99)
Potassium: 4.3 mmol/L (ref 3.5–5.1)
Sodium: 139 mmol/L (ref 135–145)
Total Bilirubin: 2 mg/dL — ABNORMAL HIGH (ref 0.3–1.2)
Total Protein: 7.3 g/dL (ref 6.5–8.1)

## 2022-04-05 LAB — CBC
HCT: 35.4 % — ABNORMAL LOW (ref 36.0–46.0)
Hemoglobin: 11.6 g/dL — ABNORMAL LOW (ref 12.0–15.0)
MCH: 32.3 pg (ref 26.0–34.0)
MCHC: 32.8 g/dL (ref 30.0–36.0)
MCV: 98.6 fL (ref 80.0–100.0)
Platelets: 122 10*3/uL — ABNORMAL LOW (ref 150–400)
RBC: 3.59 MIL/uL — ABNORMAL LOW (ref 3.87–5.11)
RDW: 13.2 % (ref 11.5–15.5)
WBC: 4.2 10*3/uL (ref 4.0–10.5)
nRBC: 0 % (ref 0.0–0.2)

## 2022-04-05 LAB — BRAIN NATRIURETIC PEPTIDE: B Natriuretic Peptide: 221.3 pg/mL — ABNORMAL HIGH (ref 0.0–100.0)

## 2022-04-05 NOTE — Progress Notes (Addendum)
Advance Heart Failure Clinic Note   PCP:  [name], MD  Cardiologist:  Candee Furbish, MD Primary HF: [name]  Chief Complaint: Heart Failure follow-up   History of Present Illness:  Kristi Parker is a 71 y.o. female with history of VT, severe systolic HF due to NICM (EF 20-25%) s/p Medtronic BiV ICD, CVA 2004, PAF, and CKD III-IV.    She was first diagnosed with HF in 2013. She is unsure of the cause. She had a heart cath and did not have any blockages. We also follow her son in HF clinic for NICM. She states that multiple family members have severe HF and several have underwent transplant.   Based on CPX testing, we referred her to Dr. Mosetta P[facility]on at [facility]. She was seen there on 02/22/18 and they discussed need for advanced therapies including possible heart or heart-kidney transplant. She had f/u with them subsequently and felt not to be candidate for heart-kidney transplant but might be candidate for high-risk VAD. We reviewed case with [facility] and she was also turned down for heart/kidney transplant due to age. She has decided she is not interested in VAD.    Had repeat CPX in 10/19 with low Vo2 (13.5) and high slope (42). Repeat RHC done on 10/19 as below as part of transplant work up at [facility] showed elevated volume with CI 2.7   Today she returns for HF follow up. Here with her husband. Continues to go to the gym 5 days a week. Rides the bike for 45-50 minutes. Takes 8-10 mins to do a mile. No change in that. Does arm weights. No undue SOB. Gets SOB going up steps. No CP, orthopnea or PND. Compliant with all meds.  Echo today 04/05/22 EF 20-25% Moderate MR Personally reviewed  Cardiac Studies: Echo (9/21): EF 25-30%, severe LV dysfunction, RV ok. Echo (8/20): EF 25% RV normal (Personally reviewed). CPX (10/19): low Vo2 (13.5), high slope (42).  RHC (10/19):  Findings:  RA = 12 RV = 53/12 PA = 51/22 (34) PCW = mean 28 (v = 35-40) Fick cardiac output/index = 4.4/2.7 PVR =  1.4 WU FA sat = 98% PA sat = 62%, 63% PAPi = 2.4 RA/PCWP = 0.42  Assessment: 1. NICM with elevated filling pressures and relatively preserved output. 2. Prominent v-waves in PCWP tracing suggestive of significant MR Plan/Discussion: Increase diuretics. Continue transplant w/u based on CPX results.   Past Medical History:  Diagnosis Date   Adenomatous colon polyp    Cervical radiculopathy at C5    CHF (congestive heart failure) (HCC)    Chronic systolic heart failure (HCC)    CKD (chronic kidney disease), stage III (HCC)    DDD (degenerative disc disease), cervical    DDD (degenerative disc disease), lumbar    Fibroid    GERD (gastroesophageal reflux disease)    Hearing loss 02/2010   L hearing loss/vertigo, steroids   History of seizure disorder    Related Hx   Mitral regurgitation    moderate   Multiple sclerosis ([place]) 1984   Nonischemic cardiomyopathy (HCC)    Moderate LVEF 35-40% by ECHO 2011, 25-30% by echo 2013   Permanent atrial fibrillation ([place])    Pulmonary hypertension, secondary 06/04/2013   As a result of nonischemic cardiomyopathy EF 25-30%   Stroke [facility])    Right brain CVA, complete recovery 07/2003   Ventricular tachycardia ([place])    Vertigo 02/2010   L hearing loss/vertigo, steroids   Past Surgical History:  Procedure Laterality Date   BIV PACEMAKER GENERATOR CHANGE OUT N/A 09/18/2014   Procedure: BIV PACEMAKER GENERATOR CHANGE OUT;  Surgeon: Thompson Grayer, MD;  Location: [facility] CATH [facility];  Service: Cardiovascular;  Laterality: N/A;   DILATION AND CURETTAGE OF UTERUS     EP IMPLANTABLE DEVICE N/A 07/28/2016   Procedure: ICD Implant;  Surgeon: Evans Lance, MD;  Location: [place] CV LAB;  Service: Cardiovascular;  Laterality: N/A;   HYSTEROSCOPY     PACEMAKER INSERTION     PTVP 08/2001 for complete heart block. Upgrade PTVP to MDT BiV 06/2008 by Dr Leonia Reeves   RIGHT HEART CATH N/A 05/17/2018   Procedure: RIGHT HEART CATH;  Surgeon: Jolaine Artist, MD;   Location: [place] CV LAB;  Service: Cardiovascular;  Laterality: N/A;   RIGHT/LEFT HEART CATH AND CORONARY ANGIOGRAPHY N/A 02/02/2018   Procedure: RIGHT/LEFT HEART CATH AND CORONARY ANGIOGRAPHY;  Surgeon: Jolaine Artist, MD;  Location: [place] CV LAB;  Service: Cardiovascular;  Laterality: N/A;   TUBAL LIGATION      Current Outpatient Medications  Medication Sig Dispense Refill   acetaminophen (TYLENOL) 500 MG tablet Take 500-1,000 mg by mouth every 6 (six) hours as needed (for back pain).      ALPHAGAN P 0.1 % SOLN Place 1 drop into both eyes 2 (two) times daily.      amiodarone (PACERONE) 200 MG tablet Take 0.5 tablets (100 mg total) by mouth daily. NEEDS FOLLOW UP APPOINTMENT FOR MORE REFILLS 45 tablet 0   Biotin 2500 MCG CAPS Take 2,500 mcg by mouth at bedtime.     Calcium Carbonate-Vitamin D (CALCIUM + D PO) Take 1 tablet by mouth every evening.     carvedilol (COREG) 3.125 MG tablet TAKE 1 TABLET BY MOUTH TWICE A DAY WITH MEAL NEEDS FOLLOW UP APPT FOR REFILLS 180 tablet 0   ELIQUIS 2.5 MG TABS tablet TAKE 1 TABLET BY MOUTH 2 (TWO) TIMES DAILY. NEEDS FOLLOW UP APPOINTMENT FOR ANYMORE REFILLS 180 tablet 0   FARXIGA 10 MG TABS tablet TAKE 1 TABLET BY MOUTH EVERY DAY BEFORE BREAKFAST 90 tablet 3   fexofenadine (ALLEGRA) 180 MG tablet Take 180 mg by mouth daily as needed for allergies.      furosemide (LASIX) 40 MG tablet Take 1 tablet (40 mg total) by mouth daily as needed. TAKE 1 TABLET BY MOUTH  DAILY. MAY TAKE EXTRA 1/2  TABLET AS NEEDED FOR  SWELLING. 135 tablet 3   levothyroxine (SYNTHROID, LEVOTHROID) 50 MCG tablet Take 50 mcg by mouth daily before breakfast.      loratadine (CLARITIN) 10 MG tablet Take 10 mg by mouth daily as needed for allergies.     Magnesium 500 MG TABS Take 500 mg by mouth at bedtime.     sacubitril-valsartan (ENTRESTO) 49-51 MG Take 1 tablet by mouth 2 (two) times daily. 180 tablet 3   spironolactone (ALDACTONE) 25 MG tablet TAKE 1/2 TABLET BY MOUTH  DAILY. NEEDS FOLLOW UP APPOINTMENT FOR MORE REFILLS 45 tablet 0   No current facility-administered medications for this encounter.    Allergies:   Patient has no known allergies.   Social History:  The patient  reports that she has never smoked. She has never used smokeless tobacco. She reports current alcohol use. She reports that she does not use drugs.   Family History:  The patient's family history includes CAD in an other family member; Cancer in her father; Diabetes in her mother; Multiple sclerosis in her mother.  ROS:  Please see the history of present illness.   All other systems are personally reviewed and negative.   Vitals:   04/05/22 1502  BP: 110/80  Pulse: 64  SpO2: 99%  Weight: 55.7 kg (122 lb 12.8 oz)   Wt Readings from Last 3 Encounters:  04/05/22 55.7 kg (122 lb 12.8 oz)  01/13/21 59.1 kg (130 lb 6.4 oz)  12/28/20 57.6 kg (127 lb)   Exam:  General:  Well appearing. No resp difficulty HEENT: normal Neck: supple. no JVD. Carotids 2+ bilat; no bruits. No lymphadenopathy or thryomegaly appreciated. Cor: PMI laterally displaced. Regular rate & rhythm. No rubs, gallops or murmurs. Lungs: clear Abdomen: soft, nontender, nondistended. No hepatosplenomegaly. No bruits or masses. Good bowel sounds. Extremities: no cyanosis, clubbing, rash, edema Neuro: alert & orientedx3, cranial nerves grossly intact. moves all 4 extremities w/o difficulty. Affect pleasant   Recent Labs: No results found for requested labs within last 365 days.  Personally reviewed    ICD interrogation: Optivol low > 99% Vpacing. No VT/AF  Activity level < 1hr Personally reviewed  ASSESSMENT AND PLAN:  1. Chronic Systolic Heart Failure due to NICM. S/p Medtronic BiV ICD. Likely familial CM.  - Echo (9/21): EF 25-30%, severe LV dysfunction, RV ok. - Echo (8/20): EF 25% RV normal Personally reviewed - Echo (12/14/17): EF 20-25%, trivial AI, mild to mod MR, mildly reduced RV, severely dilated RA  and LA, PA pressures moderately to severely increased.  - CPX (05/16/18): pVO2 12.1, slope 48 - RHC (05/17/18): NICM with elevated filling pressures and relatively preserved output, suggestive of significant MR - LHC (02/02/18) with normal coronaries. RHC showed severe NICM with well compensated hemodynamics. - CPX (12/27/17): Peak VO2 13.5, slope 42 - Echo 9/21; EF 25-30% - Echo today 04/05/22 EF 20-25% Personally reviewed - Stable NYHAII-III. Volume status ok on exam and ICD interrogation. She does not need daily lasix. - Continue carvedilol 3.125 mg bid. - Continue Entresto 49/51 mg bid. - Continue spiro 12.5 mg daily.  - Continue Farxiga 10 mg daily. - Blood type AB+. She has been seen by Dr. Mosetta Pigeon at [facility] and felt not to be candidate for heart-kidney transplant, but might be candidate for high-risk VAD. [facility] declined seeing her for heart-kidney transplant due to age.   - Last discussed VAD on 6/21 (versus repeating CPX) but says she appears stable and wants to hold off for now.  - Her son has been diagnosed LMNA cardiomyopathy and she wants to get genetic tested as well. We will refer to Dr. Broadus John - We discussed Barostim device again   2. PAF - Remains in NSR.  - CHA2DS2VASC of at least 5. - Continue amiodarone 100 mg daily. - Continue Eliquis 2.5 bid - No bleeding issues. - Labs today   3. CKD IV - Baseline SCr ~ 1.7-1.9. - Labs today  4. MR - Mild on most recent echo (9/21). No change.   5. Hx of VT s/p Medtronic BiV ICD - ICD interrogated personally today as above. No VT  - Continue amiodarone 100 mg day.    Glori Bickers, MD  04/05/2022 3:13 PM  Advanced Heart Failure [place] [address] Heart and [place] [address] [phone] (office) [phone] (fax)

## 2022-04-05 NOTE — Patient Instructions (Signed)
Labs done today, your results will be available in MyChart, we will contact you for abnormal readings.  You have been referred to Dr Broadus John, genetic counselor, her office will call you for an appointment  Your physician recommends that you schedule a follow-up appointment in: 6 months (Feb 2024), **PLEASE CALL OUR OFFICE IN DECEMBER TO SCHEDULE THIS APPOINTMENT  If you have any questions or concerns before your next appointment please send [place] a message through [place] or call our office at [phone].    TO LEAVE A MESSAGE FOR THE NURSE SELECT OPTION 2, PLEASE LEAVE A MESSAGE INCLUDING: YOUR NAME DATE OF BIRTH CALL BACK NUMBER REASON FOR CALL**this is important as we prioritize the call backs  YOU WILL RECEIVE A CALL BACK THE SAME DAY AS LONG AS YOU CALL BEFORE 4:00 PM  At the [facility], you and your health needs are our priority. As part of our continuing mission to provide you with exceptional heart care, we have created designated Provider Care Teams. These Care Teams include your primary Cardiologist (physician) and Advanced Practice Providers (APPs- Physician Assistants and Nurse Practitioners) who all work together to provide you with the care you need, when you need it.   You may see any of the following providers on your designated Care Team at your next follow up: Dr Glori Bickers Dr Haynes Kerns, NP Lyda Jester, [place] [facility] [place], [place] Audry Riles, PharmD   Please be sure to bring in all your medications bottles to every appointment.

## 2022-04-05 NOTE — Addendum Note (Signed)
Encounter addended by: Scarlette Calico, RN on: 04/05/2022 3:53 PM  Actions taken: Order list changed, Diagnosis association updated, Clinical Note Signed, Charge Capture section accepted

## 2022-04-08 NOTE — Progress Notes (Signed)
EPIC Encounter for ICM Monitoring  Patient Name: Kristi Parker is a 71 y.o. female Date: 04/08/2022 Primary Care Physican: [name], MD Primary Cardiologist: [name] Electrophysiologist: [name] Bi-V Pacing:  95.4%    01/28/2022 Weight:  118 lbs 03/04/2022 Weight: 118-121 lbs  Since 13-Jan-2021 VT-NS (>4 beats, >130 bpm) 389         Transmission reviewed.  Defib check performed at HF clinic OV on 8/22.   Optivol thoracic impedance suggesting normal fluid levels.    Prescribed:  Furosemide 40 mg take 1 tablet by mouth daily as needed. May take extra 1/2 tablet as needed for swelling. Spironolactone 25 mg take 0.5 tablet (12.5 mg total) daily   Labs: 04/05/2022 Creatinine 2.03, BUN 24, Potassium 4.3, Sodium 139, GFR 26 A complete set of results can be found in Results Review.   Recommendations:  Recommendations given at 8/22 HF clinic OV.    Follow-up plan: ICM clinic phone appointment on 05/09/2022.  91 day device clinic remote transmission 06/03/2022.     EP/Cardiology Office Visits:  Recall 12/24/2021 with Oda Kilts, PA.  Next HF clinic OV due 09/2022 (no recall but OV note says call in December for appt).     Copy of ICM check sent to Dr Curt Bears (replacing Dr. Rayann Heman).  3 month ICM trend: 04/05/2022.    12-14 Month ICM trend:     [name], RN 04/08/2022 1:53 PM

## 2022-04-13 IMAGING — CT CT VIRTUAL COLONOSCOPY DIAGNOSTIC
3 of 9 series · 15 of 46 positions shown, 17 images · non-contrast
Comparison: None.

CLINICAL DATA: Screening, colon polyp removed during colonoscopy

EXAM:
CT VIRTUAL COLONOSCOPY DIAGNOSTIC
TECHNIQUE: The patient was given a standard Mag citrate bowel preparation with
Gastrografin and barium for fluid and stool tagging respectively.
The quality of the bowel preparation is moderate. Automated CO2
insufflation of the colon was performed prior to image acquisition
and colonic distention is moderate. Image post processing was used
to generate a 3D endoluminal fly-through projection of the colon and
to electronically subtract stool/fluid as appropriate.

[Series 4: supine colon 1.50 br40 s3 supine thins · axial · 0.61mm/px · z∈[+1459,+1807]mm · 9 of 292 slices shown, 11 images]
[im 30/292  soft-tissue]
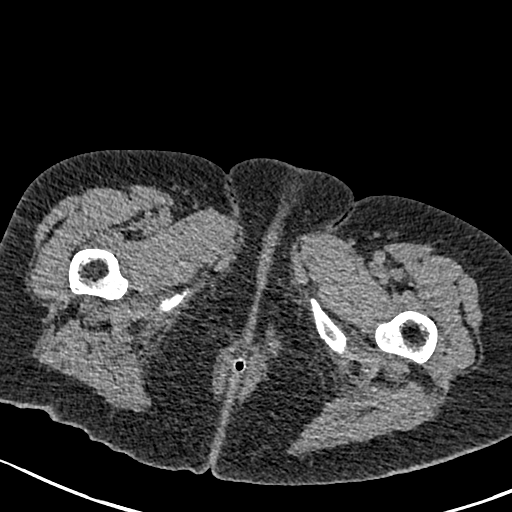
[im 30/292  bone]
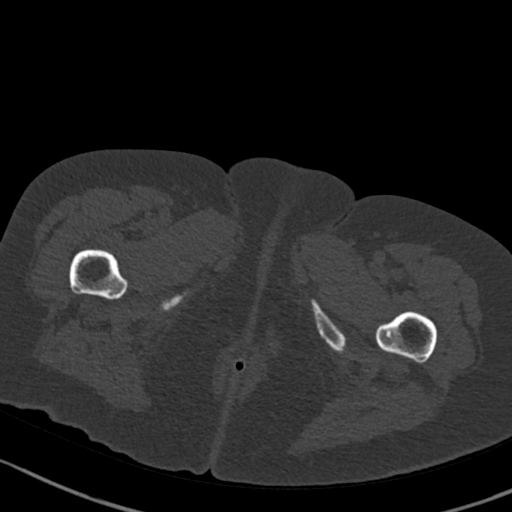
[im 59/292  soft-tissue]
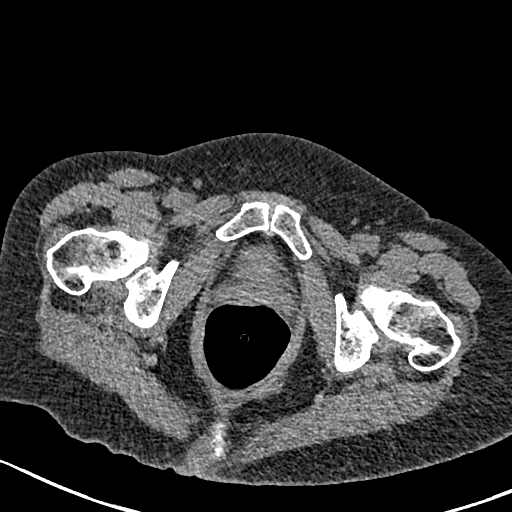
[im 88/292  soft-tissue]
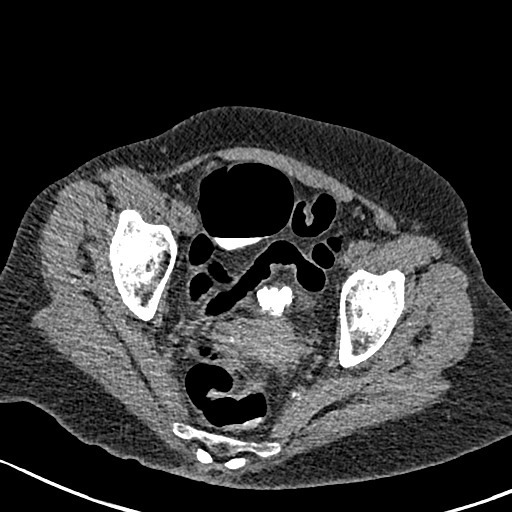
[im 117/292  soft-tissue]
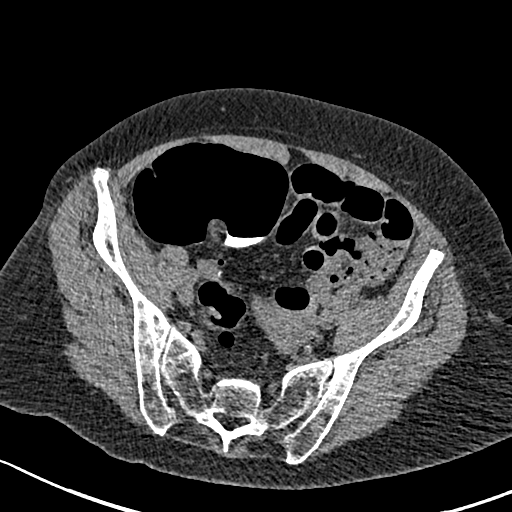
[im 146/292  soft-tissue]
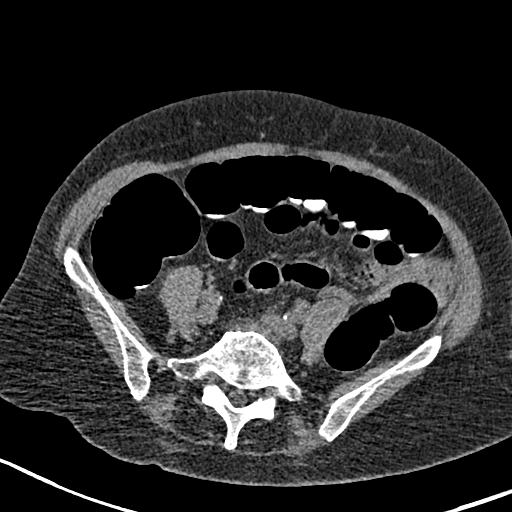
[im 175/292  soft-tissue]
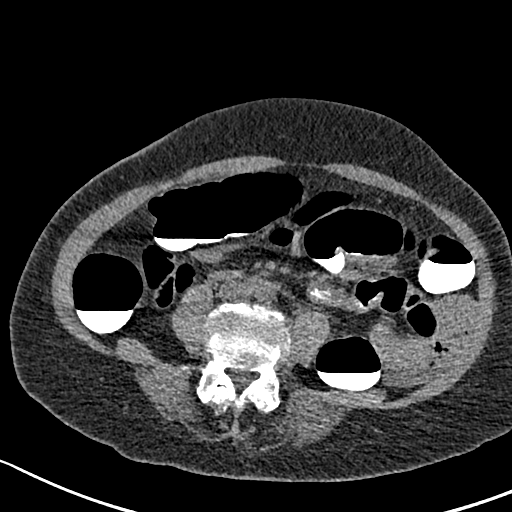
[im 204/292  soft-tissue]
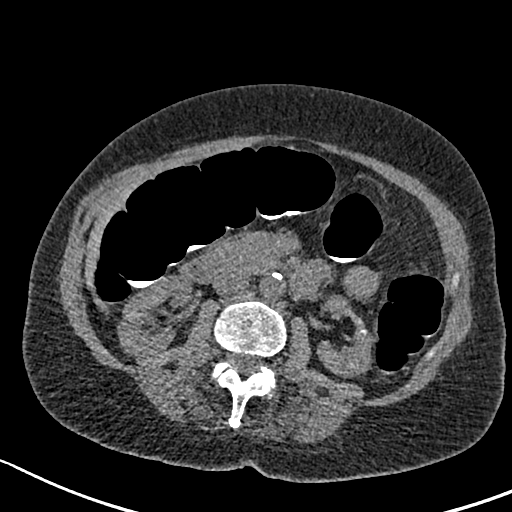
[im 233/292  soft-tissue]
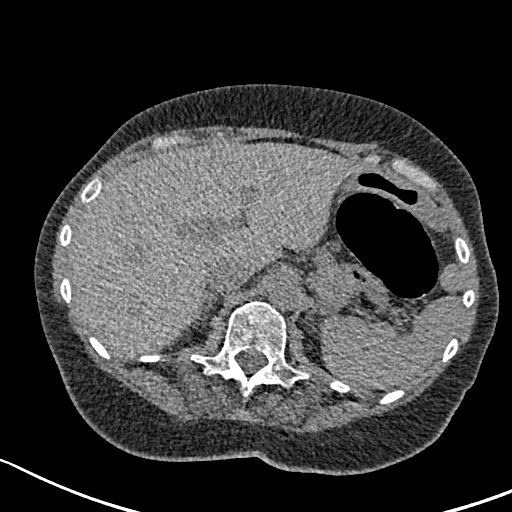
[im 262/292  soft-tissue]
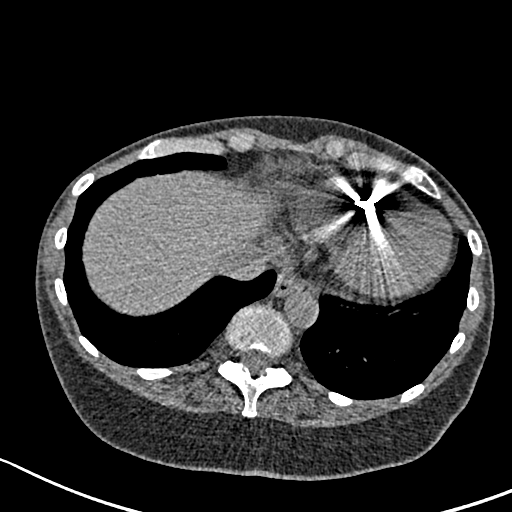
[im 262/292  bone]
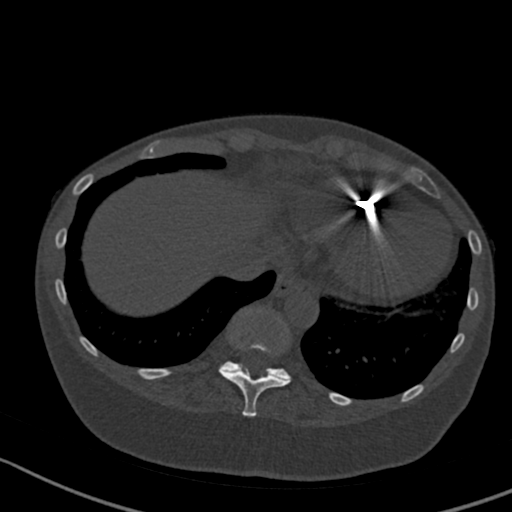

[Series 6: supine colon 3.00 br40 s3 cor supine · coronal · 0.61mm/px · 3 of 102 slices shown]
[im 26/102  soft-tissue]
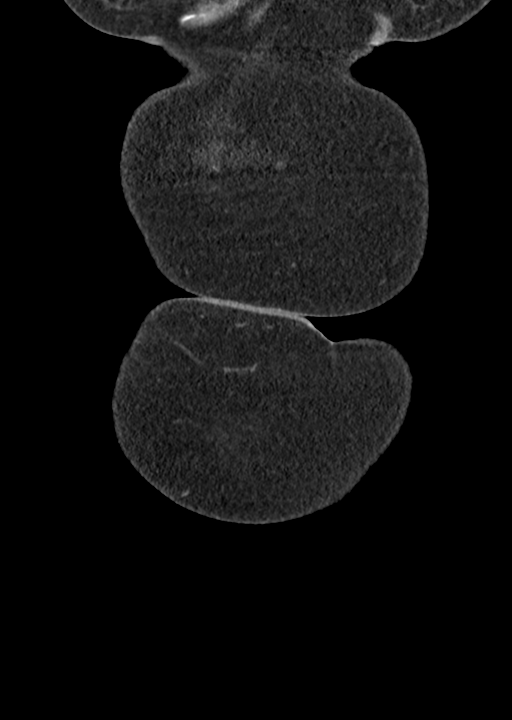
[im 51/102  soft-tissue]
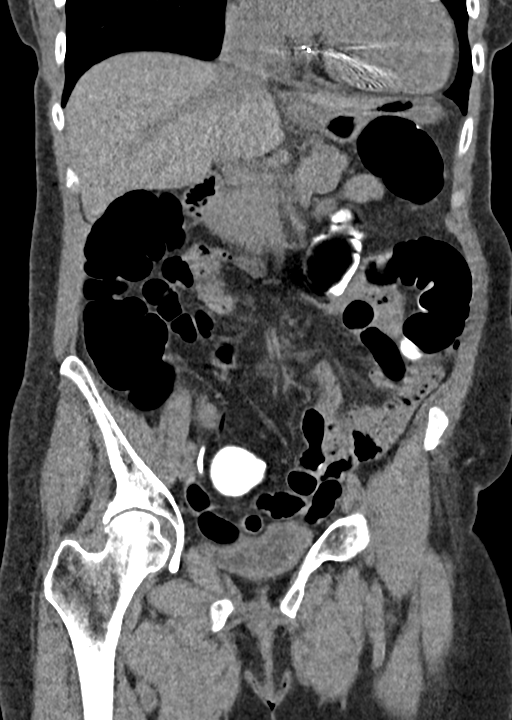
[im 76/102  soft-tissue]
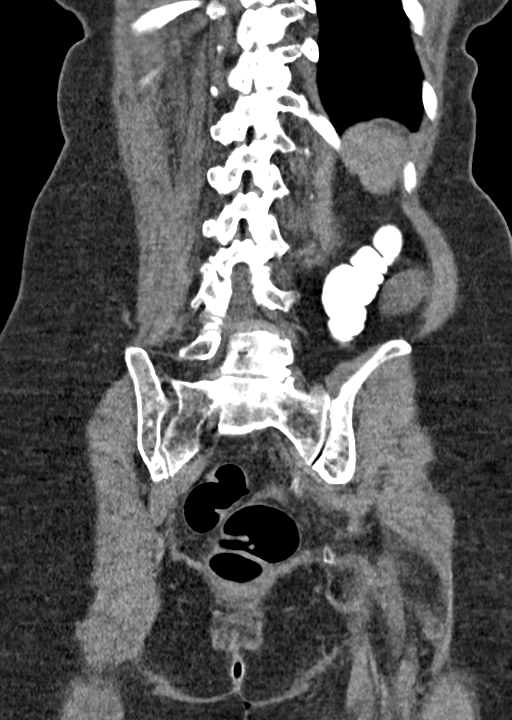

[Series 11: prone colon 1.50 br40 s3 prone thin · axial · 0.61mm/px · z∈[+1542,+1636]mm · 3 of 283 slices shown]
[im 32/283  soft-tissue]
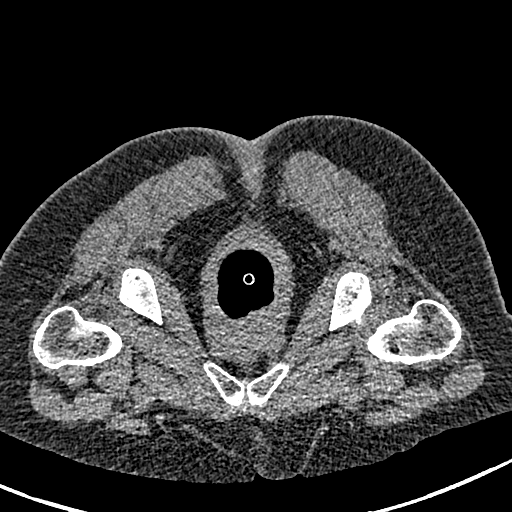
[im 63/283  soft-tissue]
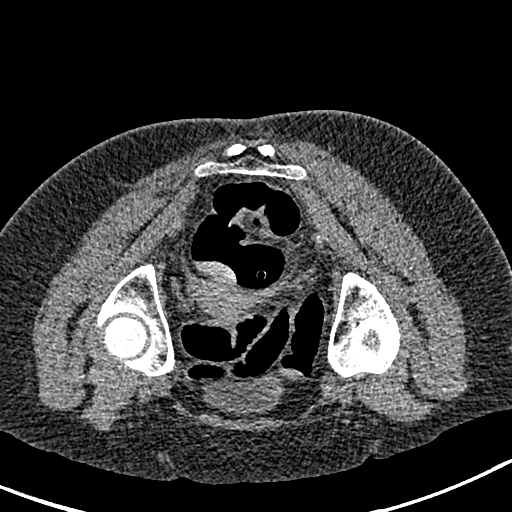
[im 95/283  soft-tissue]
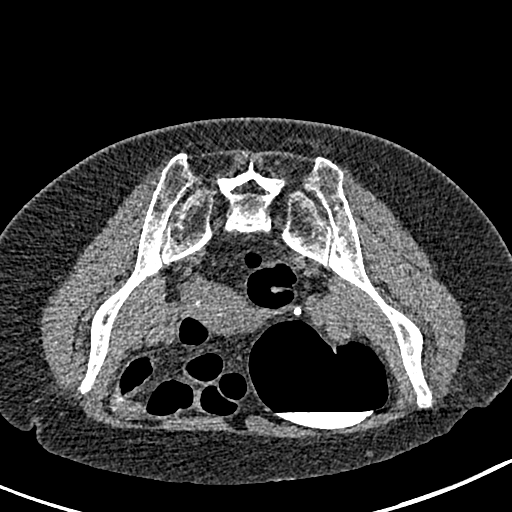

[15 of 46 positions shown; findings below may reference images not displayed]

FINDINGS: VIRTUAL COLONOSCOPY

No significant colonic polyp, mass, apple core lesion, or stricture.

No evidence of bowel obstruction.

Normal appendix (series 5/image 91).

Virtual colonoscopy is not designed to detect diminutive polyps
(i.e., less than or equal to 5 mm), the presence or absence of which
may not affect clinical management.

CT ABDOMEN AND PELVIS WITHOUT CONTRAST

Lower chest: Cardiomegaly with ICD leads, incompletely visualized.
Lung bases are clear.

Hepatobiliary: Unenhanced liver is unremarkable.

Gallbladder is unremarkable. No intrahepatic or extrahepatic ductal
dilatation.

Pancreas: Within normal limits.

Spleen: Within normal limits.

Adrenals/Urinary Tract: Adrenal glands are within normal limits.

Scarring along the lateral left upper kidney with associated 5 mm
parenchymal calcification (series 5/image 45). 3 mm right upper pole
renal calculus (series 5/image 42). Right kidney is otherwise within
normal limits. No hydronephrosis.

Bladder is within normal limits.

Stomach/Bowel: Stomach is within normal limits.

Visualized bowel is described above.

Vascular/Lymphatic: No evidence of abdominal aortic aneurysm.

Atherosclerotic calcifications of the abdominal aorta and branch
vessels.

No suspicious abdominopelvic lymphadenopathy.

Reproductive: Calcified uterine fibroids, measuring up to 3.7 cm in
the right anterior uterine body (series 5/image 99).

Bilateral ovaries are within normal limits.

Other: No abdominopelvic ascites.

Musculoskeletal: Mild degenerative changes of the lumbar spine.
IMPRESSION: No significant colonic polyp, mass, apple core lesion, or stricture.

Additional ancillary findings as above.

## 2022-04-19 DIAGNOSIS — Z1152 Encounter for screening for COVID-19: Secondary | ICD-10-CM | POA: Diagnosis not present

## 2022-04-19 DIAGNOSIS — Z20822 Contact with and (suspected) exposure to covid-19: Secondary | ICD-10-CM | POA: Diagnosis not present

## 2022-05-05 ENCOUNTER — Telehealth: Payer: Self-pay

## 2022-05-05 NOTE — Telephone Encounter (Signed)
Patient called wanting results of Barostim Authorization.  Per Isaias Sakai wit [employer], "The pred and first appeal for Kristi Parker were denied by [facility] and the appeal is being forwarded to [facility] for external review. We need her traditional Medicare ID in order to get updates from [employer]".  I called patient to relay this information and get her Medicare ID.  Left voice message to return my call.

## 2022-05-09 ENCOUNTER — Ambulatory Visit (INDEPENDENT_AMBULATORY_CARE_PROVIDER_SITE_OTHER): Payer: Medicare PPO

## 2022-05-09 DIAGNOSIS — Z95810 Presence of automatic (implantable) cardiac defibrillator: Secondary | ICD-10-CM

## 2022-05-09 DIAGNOSIS — I5022 Chronic systolic (congestive) heart failure: Secondary | ICD-10-CM

## 2022-05-12 NOTE — Progress Notes (Signed)
EPIC Encounter for ICM Monitoring  Patient Name: Kristi Parker is a 71 y.o. female Date: 05/12/2022 Primary Care Physican: [name], MD Primary Cardiologist: [name] Electrophysiologist: [name] Bi-V Pacing:  96.6%    01/28/2022 Weight:  118 lbs 03/04/2022 Weight: 118-121 lbs 05/12/2022 Weight: 118.2lbs  Battery Longevity: 7 months          Spoke with patient and heart failure questions reviewed.  Transmission results reviewed.  Pt asymptomatic for fluid accumulation.  Reports feeling well at this time and voices no complaints.     Optivol thoracic impedance suggesting normal fluid levels.    Prescribed:  Furosemide 40 mg take 1 tablet by mouth daily as needed. May take extra 1/2 tablet as needed for swelling. Spironolactone 25 mg take 0.5 tablet (12.5 mg total) daily   Labs: 04/05/2022 Creatinine 2.03, BUN 24, Potassium 4.3, Sodium 139, GFR 26 A complete set of results can be found in Results Review.   Recommendations:  No changes and encouraged to call if experiencing any fluid symptoms.   Follow-up plan: ICM clinic phone appointment on 06/13/2022.  91 day device clinic remote transmission 06/03/2022.     EP/Cardiology Office Visits:  Recall 12/24/2021 with Oda Kilts, PA.  Next HF clinic OV due 09/2022 (no recall but OV note says call in December for appt).     Copy of ICM check sent to Dr Curt Bears.  3 month ICM trend: 05/09/2022.    12-14 Month ICM trend:     [name], RN 05/12/2022 3:48 PM

## 2022-05-31 ENCOUNTER — Telehealth: Payer: Self-pay

## 2022-05-31 NOTE — Telephone Encounter (Signed)
Patient returned missed call stating, "there must be some confusion because I haven't decided to have the Barostim surgery yet. " I explained the process that our office was letting her know she was approved for surgery & for the consult with Dr. Trula Slade for evaluation of Barostim and to discuss the surgery with her further, but that she could still make the decision on whether to proceed. Patient stated, she still does not want to have a consult at this time. Advised patient to contact our office, if she changes her mind and would like to complete consult. She verbalized understanding.

## 2022-05-31 NOTE — Telephone Encounter (Signed)
I called patient to advise her of the need for an office Consult with Dr. Trula Slade so that he can perform her approved Barostim procedure ASAP.  There was no answer.  I left a voice message saying we will try to call her back.

## 2022-06-02 ENCOUNTER — Other Ambulatory Visit: Payer: Self-pay | Admitting: Internal Medicine

## 2022-06-03 ENCOUNTER — Ambulatory Visit (INDEPENDENT_AMBULATORY_CARE_PROVIDER_SITE_OTHER): Payer: Medicare PPO

## 2022-06-03 DIAGNOSIS — I428 Other cardiomyopathies: Secondary | ICD-10-CM | POA: Diagnosis not present

## 2022-06-03 LAB — CUP PACEART REMOTE DEVICE CHECK
Battery Remaining Longevity: 6 mo
Battery Voltage: 2.82 V
Brady Statistic AP VP Percent: 98.74 %
Brady Statistic AP VS Percent: 1.26 %
Brady Statistic AS VP Percent: 0 %
Brady Statistic AS VS Percent: 0 %
Brady Statistic RA Percent Paced: 99.99 %
Brady Statistic RV Percent Paced: 97.4 %
Date Time Interrogation Session: 20231020033525
HighPow Impedance: 37 Ohm
Implantable Lead Implant Date: 20091104
Implantable Lead Implant Date: 20091104
Implantable Lead Implant Date: 20171214
Implantable Lead Location: 753858
Implantable Lead Location: 753859
Implantable Lead Location: 753860
Implantable Lead Model: 4196
Implantable Lead Model: 5076
Implantable Pulse Generator Implant Date: 20171214
Lead Channel Impedance Value: 361 Ohm
Lead Channel Impedance Value: 418 Ohm
Lead Channel Impedance Value: 456 Ohm
Lead Channel Impedance Value: 475 Ohm
Lead Channel Impedance Value: 665 Ohm
Lead Channel Impedance Value: 988 Ohm
Lead Channel Pacing Threshold Amplitude: 0.5 V
Lead Channel Pacing Threshold Amplitude: 0.625 V
Lead Channel Pacing Threshold Amplitude: 2.75 V
Lead Channel Pacing Threshold Pulse Width: 0.4 ms
Lead Channel Pacing Threshold Pulse Width: 0.4 ms
Lead Channel Pacing Threshold Pulse Width: 0.6 ms
Lead Channel Sensing Intrinsic Amplitude: 0.125 mV
Lead Channel Sensing Intrinsic Amplitude: 0.125 mV
Lead Channel Sensing Intrinsic Amplitude: 7.5 mV
Lead Channel Sensing Intrinsic Amplitude: 7.5 mV
Lead Channel Setting Pacing Amplitude: 2 V
Lead Channel Setting Pacing Amplitude: 2.5 V
Lead Channel Setting Pacing Amplitude: 4.25 V
Lead Channel Setting Pacing Pulse Width: 0.4 ms
Lead Channel Setting Pacing Pulse Width: 0.6 ms
Lead Channel Setting Sensing Sensitivity: 0.3 mV

## 2022-06-09 NOTE — Progress Notes (Signed)
Remote ICD transmission.   

## 2022-06-13 ENCOUNTER — Encounter: Payer: Self-pay | Admitting: Cardiology

## 2022-06-13 ENCOUNTER — Ambulatory Visit: Payer: Medicare PPO | Attending: Cardiology | Admitting: Cardiology

## 2022-06-13 ENCOUNTER — Ambulatory Visit (INDEPENDENT_AMBULATORY_CARE_PROVIDER_SITE_OTHER): Payer: Medicare PPO

## 2022-06-13 VITALS — BP 96/70 | HR 60 | Ht 63.0 in | Wt 123.4 lb

## 2022-06-13 DIAGNOSIS — I5022 Chronic systolic (congestive) heart failure: Secondary | ICD-10-CM | POA: Diagnosis not present

## 2022-06-13 DIAGNOSIS — Z95810 Presence of automatic (implantable) cardiac defibrillator: Secondary | ICD-10-CM

## 2022-06-13 DIAGNOSIS — I428 Other cardiomyopathies: Secondary | ICD-10-CM | POA: Diagnosis not present

## 2022-06-13 DIAGNOSIS — I480 Paroxysmal atrial fibrillation: Secondary | ICD-10-CM | POA: Diagnosis not present

## 2022-06-13 NOTE — Patient Instructions (Signed)
Medication Instructions:  Your physician recommends that you continue on your current medications as directed. Please refer to the Current Medication list given to you today.  *If you need a refill on your cardiac medications before your next appointment, please call your pharmacy*   Lab Work: None ordered.  If you have labs (blood work) drawn today and your tests are completely normal, you will receive your results only by: [place] (if you have MyChart) OR A paper copy in the mail If you have any lab test that is abnormal or we need to change your treatment, we will call you to review the results.   Testing/Procedures: None ordered.    Follow-Up: At [facility], you and your health needs are our priority.  As part of our continuing mission to provide you with exceptional heart care, we have created designated Provider Care Teams.  These Care Teams include your primary Cardiologist (physician) and Advanced Practice Providers (APPs -  Physician Assistants and Nurse Practitioners) who all work together to provide you with the care you need, when you need it.  We recommend signing up for the patient portal called "MyChart".  Sign up information is provided on this After Visit Summary.  MyChart is used to connect with patients for Virtual Visits (Telemedicine).  Patients are able to view lab/test results, encounter notes, upcoming appointments, etc.  Non-urgent messages can be sent to your provider as well.   To learn more about what you can do with MyChart, go to [email or web address].    Your next appointment:   6 months with Dr Curt Bears  Important Information About Sugar

## 2022-06-13 NOTE — Progress Notes (Signed)
Electrophysiology Office Note   Date:  06/13/2022   ID:  Kristi Parker, Kristi Parker [birth year], MRN [id number]  PCP:  [name], MD  Cardiologist:  [place] Primary Electrophysiologist:  Kristi Reierson Meredith Leeds, MD    Chief Complaint: CHF   History of Present Illness: Kristi Parker is a 71 y.o. female who is being seen today fo as a r the evaluation of CHF at the request of [name], MD. Presenting today for electrophysiology evaluation.  She has a history significant for chronic systolic heart failure due to nonischemic cardiomyopathy post Medtronic CRT-D, VT, CVA in 2014, atrial fibrillation, CKD stage III-IV.  Heart failure diagnosed in 2013.  No cause has been found.  Today, she denies symptoms of palpitations, chest pain, shortness of breath, orthopnea, PND, lower extremity edema, claudication, dizziness, presyncope, syncope, bleeding, or neurologic sequela. The patient is tolerating medications without difficulties.    Past Medical History:  Diagnosis Date   Adenomatous colon polyp    Cervical radiculopathy at C5    CHF (congestive heart failure) (HCC)    Chronic systolic heart failure (HCC)    CKD (chronic kidney disease), stage III (HCC)    DDD (degenerative disc disease), cervical    DDD (degenerative disc disease), lumbar    Fibroid    GERD (gastroesophageal reflux disease)    Hearing loss 02/2010   L hearing loss/vertigo, steroids   History of seizure disorder    Related Hx   Mitral regurgitation    moderate   Multiple sclerosis ([place]) 1984   Nonischemic cardiomyopathy (HCC)    Moderate LVEF 35-40% by ECHO 2011, 25-30% by echo 2013   Permanent atrial fibrillation ([place])    Pulmonary hypertension, secondary 06/04/2013   As a result of nonischemic cardiomyopathy EF 25-30%   Stroke [facility])    Right brain CVA, complete recovery 07/2003   Ventricular tachycardia ([place])    Vertigo 02/2010   L hearing loss/vertigo, steroids   Past Surgical History:  Procedure  Laterality Date   BIV PACEMAKER GENERATOR CHANGE OUT N/A 09/18/2014   Procedure: BIV PACEMAKER GENERATOR CHANGE OUT;  Surgeon: Thompson Grayer, MD;  Location: [place] CATH LAB;  Service: Cardiovascular;  Laterality: N/A;   DILATION AND CURETTAGE OF UTERUS     EP IMPLANTABLE DEVICE N/A 07/28/2016   Procedure: ICD Implant;  Surgeon: Evans Lance, MD;  Location: [place] CV LAB;  Service: Cardiovascular;  Laterality: N/A;   HYSTEROSCOPY     PACEMAKER INSERTION     PTVP 08/2001 for complete heart block. Upgrade PTVP to MDT BiV 06/2008 by Dr Leonia Reeves   RIGHT HEART CATH N/A 05/17/2018   Procedure: RIGHT HEART CATH;  Surgeon: Jolaine Artist, MD;  Location: [place] CV LAB;  Service: Cardiovascular;  Laterality: N/A;   RIGHT/LEFT HEART CATH AND CORONARY ANGIOGRAPHY N/A 02/02/2018   Procedure: RIGHT/LEFT HEART CATH AND CORONARY ANGIOGRAPHY;  Surgeon: Jolaine Artist, MD;  Location: [place] CV LAB;  Service: Cardiovascular;  Laterality: N/A;   TUBAL LIGATION       Current Outpatient Medications  Medication Sig Dispense Refill   acetaminophen (TYLENOL) 500 MG tablet Take 500-1,000 mg by mouth every 6 (six) hours as needed (for back pain).      ALPHAGAN P 0.1 % SOLN Place 1 drop into both eyes 2 (two) times daily.      amiodarone (PACERONE) 200 MG tablet Take 0.5 tablets (100 mg total) by mouth daily. 15 tablet 0   apixaban (ELIQUIS) 2.5 MG TABS  tablet Take 1 tablet (2.5 mg total) by mouth 2 (two) times daily. 60 tablet 0   Biotin 2500 MCG CAPS Take 2,500 mcg by mouth at bedtime.     Calcium Carbonate-Vitamin D (CALCIUM + D PO) Take 1 tablet by mouth every evening.     carvedilol (COREG) 3.125 MG tablet TAKE 1 TABLET BY MOUTH TWICE A DAY WITH MEAL 60 tablet 0   FARXIGA 10 MG TABS tablet TAKE 1 TABLET BY MOUTH EVERY DAY BEFORE BREAKFAST 90 tablet 3   fexofenadine (ALLEGRA) 180 MG tablet Take 180 mg by mouth daily as needed for allergies.      furosemide (LASIX) 40 MG tablet Take 1 tablet (40 mg  total) by mouth daily as needed. TAKE 1 TABLET BY MOUTH  DAILY. MAY TAKE EXTRA 1/2  TABLET AS NEEDED FOR  SWELLING. 135 tablet 3   levothyroxine (SYNTHROID, LEVOTHROID) 50 MCG tablet Take 50 mcg by mouth daily before breakfast.      loratadine (CLARITIN) 10 MG tablet Take 10 mg by mouth daily as needed for allergies.     Magnesium 500 MG TABS Take 500 mg by mouth at bedtime.     sacubitril-valsartan (ENTRESTO) 49-51 MG Take 1 tablet by mouth 2 (two) times daily. 180 tablet 3   spironolactone (ALDACTONE) 25 MG tablet Take 0.5 tablets (12.5 mg total) by mouth daily. 15 tablet 0   No current facility-administered medications for this visit.    Allergies:   Patient has no known allergies.   Social History:  The patient  reports that she has never smoked. She has never used smokeless tobacco. She reports current alcohol use. She reports that she does not use drugs.   Family History:  The patient's family history includes CAD in an other family member; Cancer in her father; Diabetes in her mother; Multiple sclerosis in her mother.    ROS:  Please see the history of present illness.   Otherwise, review of systems is positive for none.   All other systems are reviewed and negative.    PHYSICAL EXAM: VS:  BP 96/70   Pulse 60   Ht '5\' 3"'$  (1.6 m)   Wt 123 lb 6.4 oz (56 kg)   LMP  (LMP Unknown)   SpO2 97%   BMI 21.86 kg/m  , BMI Body mass index is 21.86 kg/m. GEN: Well nourished, well developed, in no acute distress  HEENT: normal  Neck: no JVD, carotid bruits, or masses Cardiac: RRR; no murmurs, rubs, or gallops,no edema  Respiratory:  clear to auscultation bilaterally, normal work of breathing GI: soft, nontender, nondistended, + BS MS: no deformity or atrophy  Skin: warm and dry, device pocket is well healed Neuro:  Strength and sensation are intact Psych: euthymic mood, full affect  EKG:  EKG is ordered today. Personal review of the ekg ordered shows AV paced  Device interrogation  is reviewed today in detail.  See PaceArt for details.   Recent Labs: 04/05/2022: ALT 24; B Natriuretic Peptide 221.3; BUN 24; Creatinine, Ser 2.03; Hemoglobin 11.6; Platelets 122; Potassium 4.3; Sodium 139    Lipid Panel     Component Value Date/Time   CHOL 181 02/20/2017 0955   TRIG 68 02/20/2017 0955   HDL 66 02/20/2017 0955   CHOLHDL 2.7 02/20/2017 0955   CHOLHDL 4 07/18/2013 1100   VLDL 24.4 07/18/2013 1100   LDLCALC 101 (H) 02/20/2017 0955     Wt Readings from Last 3 Encounters:  06/13/22 123 lb 6.4  oz (56 kg)  04/05/22 122 lb 12.8 oz (55.7 kg)  01/13/21 130 lb 6.4 oz (59.1 kg)      Other studies Reviewed: Additional studies/ records that were reviewed today include: TTE 04/05/22  Review of the above records today demonstrates:   1. Left ventricular ejection fraction, by estimation, is 20 to 25%. The  left ventricle has severely decreased function. The left ventricle  demonstrates global hypokinesis. The left ventricular internal cavity size  was moderately dilated. Left  ventricular diastolic parameters are indeterminate.   2. Right ventricular systolic function is normal. The right ventricular  size is normal. There is mildly elevated pulmonary artery systolic  pressure.   3. Left atrial size was severely dilated.   4. Right atrial size was moderately dilated.   5. The mitral valve is normal in structure. Moderate mitral valve  regurgitation. No evidence of mitral stenosis.   6. The aortic valve is tricuspid. Aortic valve regurgitation is not  visualized. No aortic stenosis is present.   7. The inferior vena cava is dilated in size with >50% respiratory  variability, suggesting right atrial pressure of 8 mmHg.    ASSESSMENT AND PLAN:  1.  Chronic systolic heart failure: Due to nonischemic cardiomyopathy.  Status post Medtronic CRT-D implanted 07/28/2016.  Currently on optimal medical therapy with carvedilol 3.125 mg twice daily, Entresto 49/51 mg twice daily,  Aldactone 12.5 mg daily, Farxiga 10 mg daily.  ICD functioning appropriately.  She has a high threshold at her current settings.  We Meline Russaw switch to LV tip to RV can.  This has reduced her threshold and gotten her 1 more month on her battery.  2.  Paroxysmal atrial fibrillation: Remains in sinus rhythm.  CHA2DS2-VASc of at least 5.  Currently on Eliquis 2.5 mg twice daily, amiodarone 100 mg daily.  3.  Secondary hypercoagulable state: Currently on Eliquis for atrial fibrillation as above.    Current medicines are reviewed at length with the patient today.   The patient does not have concerns regarding her medicines.  The following changes were made today:  none  Labs/ tests ordered today include:  Orders Placed This Encounter  Procedures   EKG 12-Lead     Disposition:   FU with Estanislao Harmon 6 months  Signed, Kristi Parker Meredith Leeds, MD  06/13/2022 11:03 AM     [facility] [address] [place] [address] [phone] (office) [phone] (fax)

## 2022-06-15 NOTE — Progress Notes (Signed)
EPIC Encounter for ICM Monitoring  Patient Name: Kristi Parker is a 71 y.o. female Date: 06/15/2022 Primary Care Physican: [name], MD Primary Cardiologist: [name] Electrophysiologist: [name] Bi-V Pacing:  99.4%    01/28/2022 Weight:  118 lbs 03/04/2022 Weight: 118-121 lbs 05/12/2022 Weight: 118.2lbs   Battery Longevity: 5 months           Transmission reviewed.  Pt had defib in office check on 10/30.   Optivol thoracic impedance suggesting normal fluid levels.    Prescribed:  Furosemide 40 mg take 1 tablet by mouth daily as needed. May take extra 1/2 tablet as needed for swelling. Spironolactone 25 mg take 0.5 tablet (12.5 mg total) daily   Labs: 04/05/2022 Creatinine 2.03, BUN 24, Potassium 4.3, Sodium 139, GFR 26 A complete set of results can be found in Results Review.   Recommendations:  No changes.   Follow-up plan: ICM clinic phone appointment on 07/18/2022.  91 day device clinic remote transmission 09/02/2022.     EP/Cardiology Office Visits:  Recall 12/24/2021 with Oda Kilts, PA.  Next HF clinic OV due 09/2022 (no recall but OV note says call in December for appt).     Copy of ICM check sent to Dr Curt Bears..   3 month ICM trend: 06/13/2022.    12-14 Month ICM trend:     [name], RN 06/15/2022 6:21 AM

## 2022-06-20 ENCOUNTER — Other Ambulatory Visit: Payer: Self-pay | Admitting: Internal Medicine

## 2022-07-04 ENCOUNTER — Ambulatory Visit (INDEPENDENT_AMBULATORY_CARE_PROVIDER_SITE_OTHER): Payer: Medicare PPO

## 2022-07-04 DIAGNOSIS — I442 Atrioventricular block, complete: Secondary | ICD-10-CM

## 2022-07-05 ENCOUNTER — Telehealth: Payer: Self-pay

## 2022-07-05 DIAGNOSIS — I442 Atrioventricular block, complete: Secondary | ICD-10-CM

## 2022-07-05 LAB — CUP PACEART REMOTE DEVICE CHECK
Battery Remaining Longevity: 5 mo
Battery Voltage: 2.81 V
Brady Statistic AP VP Percent: 99.93 %
Brady Statistic AP VS Percent: 0.07 %
Brady Statistic AS VP Percent: 0 %
Brady Statistic AS VS Percent: 0 %
Brady Statistic RA Percent Paced: 100 %
Brady Statistic RV Percent Paced: 99.14 %
Date Time Interrogation Session: 20231120022604
HighPow Impedance: 40 Ohm
Implantable Lead Connection Status: 753985
Implantable Lead Connection Status: 753985
Implantable Lead Connection Status: 753985
Implantable Lead Implant Date: 20091104
Implantable Lead Implant Date: 20091104
Implantable Lead Implant Date: 20171214
Implantable Lead Location: 753858
Implantable Lead Location: 753859
Implantable Lead Location: 753860
Implantable Lead Model: 4196
Implantable Lead Model: 5076
Implantable Pulse Generator Implant Date: 20171214
Lead Channel Impedance Value: 1083 Ohm
Lead Channel Impedance Value: 418 Ohm
Lead Channel Impedance Value: 418 Ohm
Lead Channel Impedance Value: 475 Ohm
Lead Channel Impedance Value: 532 Ohm
Lead Channel Impedance Value: 665 Ohm
Lead Channel Pacing Threshold Amplitude: 0.375 V
Lead Channel Pacing Threshold Amplitude: 0.625 V
Lead Channel Pacing Threshold Amplitude: 1.875 V
Lead Channel Pacing Threshold Pulse Width: 0.4 ms
Lead Channel Pacing Threshold Pulse Width: 0.4 ms
Lead Channel Pacing Threshold Pulse Width: 0.8 ms
Lead Channel Sensing Intrinsic Amplitude: 0.125 mV
Lead Channel Sensing Intrinsic Amplitude: 0.125 mV
Lead Channel Sensing Intrinsic Amplitude: 7.375 mV
Lead Channel Sensing Intrinsic Amplitude: 7.375 mV
Lead Channel Setting Pacing Amplitude: 2 V
Lead Channel Setting Pacing Amplitude: 2.5 V
Lead Channel Setting Pacing Amplitude: 3 V
Lead Channel Setting Pacing Pulse Width: 0.4 ms
Lead Channel Setting Pacing Pulse Width: 0.8 ms
Lead Channel Setting Sensing Sensitivity: 0.3 mV
Zone Setting Status: 755011

## 2022-07-05 NOTE — Telephone Encounter (Signed)
Patient returned call.   Patient does not recall any symptoms and unable to remember what she was doing during the time of VT. Advised I will forward to Dr. Curt Bears for review and we will call with any changes. Patient voiced understanding.   Advised of [place] DMV driving restrictions x6 months with start date of 06/30/2022 & shock plan with verbal understanding.

## 2022-07-05 NOTE — Telephone Encounter (Signed)
Scheduled remote reviewed. Normal device function.  2 VT events 51-59sec in duration, terminated with 5 & 6 rounds of ATP, both events 11/16.  Route to triage per protocol Battery estimated 17mo Next remote 12/14 & 12/21.  Attempted to contact patient. No answer, LMTCB.

## 2022-07-05 NOTE — Telephone Encounter (Signed)
Patient called advised of blood work and she can come anytime tomorrow. Advised a scheduler will contact for apt. Patient voiced understanding.  Patient also noted 2 days prior to the VT event, she receives the Covid vaccine/flu vaccine on the same day. States she had trouble sleeping and a sore arm but unable to recall any other symptoms.

## 2022-07-13 ENCOUNTER — Ambulatory Visit: Payer: Medicare PPO | Attending: Cardiology

## 2022-07-13 DIAGNOSIS — I442 Atrioventricular block, complete: Secondary | ICD-10-CM | POA: Diagnosis not present

## 2022-07-13 NOTE — Addendum Note (Signed)
Addended by: Stanton Kidney on: 07/13/2022 10:58 AM   Modules accepted: Orders

## 2022-07-14 LAB — BASIC METABOLIC PANEL
BUN/Creatinine Ratio: 10 — ABNORMAL LOW (ref 12–28)
BUN: 21 mg/dL (ref 8–27)
CO2: 27 mmol/L (ref 20–29)
Calcium: 10.2 mg/dL (ref 8.7–10.3)
Chloride: 103 mmol/L (ref 96–106)
Creatinine, Ser: 2.02 mg/dL — ABNORMAL HIGH (ref 0.57–1.00)
Glucose: 93 mg/dL (ref 70–99)
Potassium: 4.4 mmol/L (ref 3.5–5.2)
Sodium: 140 mmol/L (ref 134–144)
eGFR: 26 mL/min/{1.73_m2} — ABNORMAL LOW (ref >59)

## 2022-07-14 LAB — MAGNESIUM: Magnesium: 2.3 mg/dL (ref 1.6–2.3)

## 2022-07-18 ENCOUNTER — Other Ambulatory Visit: Payer: Self-pay | Admitting: Internal Medicine

## 2022-07-18 ENCOUNTER — Ambulatory Visit (INDEPENDENT_AMBULATORY_CARE_PROVIDER_SITE_OTHER): Payer: Medicare PPO

## 2022-07-18 DIAGNOSIS — I5022 Chronic systolic (congestive) heart failure: Secondary | ICD-10-CM

## 2022-07-18 DIAGNOSIS — Z95810 Presence of automatic (implantable) cardiac defibrillator: Secondary | ICD-10-CM | POA: Diagnosis not present

## 2022-07-22 NOTE — Progress Notes (Signed)
EPIC Encounter for ICM Monitoring  Patient Name: Kristi Parker is a 71 y.o. female Date: 07/22/2022 Primary Care Physican: [name], MD Primary Cardiologist: [name] Electrophysiologist: [name] Bi-V Pacing:  99.4%    01/28/2022 Weight:  118 lbs 03/04/2022 Weight: 118-121 lbs 05/12/2022 Weight: 118.2lbs   Battery Longevity: 5 months           Spoke with patient and heart failure questions reviewed.  Transmission results reviewed.  Pt asymptomatic for fluid accumulation.  Reports feeling well at this time and voices no complaints.     Optivol thoracic impedance suggesting normal fluid levels.    Prescribed:  Furosemide 40 mg take 1 tablet by mouth daily as needed. May take extra 1/2 tablet as needed for swelling. Spironolactone 25 mg take 0.5 tablet (12.5 mg total) daily   Labs: 07/13/2022 Creatinine 2.02, BUN 21, Potassium 4.4, Sodium 140, GFR 26 04/05/2022 Creatinine 2.03, BUN 24, Potassium 4.3, Sodium 139, GFR 26 A complete set of results can be found in Results Review.   Recommendations:  No changes and encouraged to call if experiencing any fluid symptoms.   Follow-up plan: ICM clinic phone appointment on 08/29/2022.  91 day device clinic remote transmission 09/02/2022.     EP/Cardiology Office Visits:  07/26/2022 with Tommye Standard, PA.  Next HF clinic OV due 09/2022 (no recall but OV note says call in December for appt).     Copy of ICM check sent to Dr Curt Bears.   3 month ICM trend: 07/18/2022.    12-14 Month ICM trend:     [name], RN 07/22/2022 2:40 PM

## 2022-07-24 NOTE — Progress Notes (Unsigned)
Cardiology Office Note Date:  07/24/2022  Patient ID:  Kristi, Parker [birth year], MRN [id number] PCP:  [name], MD  Cardiologist:  Dr. Haroldine Laws Electrophysiologist: Dr. Curt Bears  ***refresh   Chief Complaint: *** VT  History of Present Illness: Kristi Parker is a 71 y.o. female with history of NICM (son also has NICM as well as other family members, some have had heart transplant), VT, stroke (2004), Afib, CKD (III-IV), CHB  2019 referred to [facility], they discussed need for advanced therapies including possible heart or heart-kidney transplant. She had f/u with them subsequently and felt not to be candidate for heart-kidney transplant but might be candidate for high-risk VAD. Dr. Haroldine Laws reviewed case with [facility] and she was also turned down for heart/kidney transplant due to age.  Pt decided she is not interested in VAD.   She last saw Dr. Haroldine Laws 04/05/22, she was going to the gym 5days/week, reported good exertional capacity and medication compliance. Echo same day 04/05/22 EF 20-25% Moderate MR   Her son had been found w/LMNA cardiomyopathy and she wants to get genetic tested as well and referral to Dr. Broadus John made. Also discussed barostim No changes to her meds  She did get authorization for barostim, but had not yet been agreeable to proceed as of her phone note 05/31/22  She saw Dr. Curt Bears 06/13/22, LV threshold were high, and polarity changed to LV tip-RV can with slight improvement in battery longevity. Maintained on low dose amiodarone No other changes made.  Device clinic alert 07/05/22 note: on 11/16 to sustained VT events both treated with ATP 5 and 6 spins. Battery estimate 69moPt with no awareness or symptoms, did have the COVID and flu vaccines 2 day s prior, was feeling ok advised to get labs see Ep APP by Dr. CCurt Bears Lytes were OK, Creat was 2.00, her baseline  *** symptoms *** meds compliance *** increase amio  *** volume? *** needs amio  labs *** no driving  Device information Single chamber PPM implanted 2003 > CRT-P 2009, gen change 2016 > CRT-D 2017 with development of sustained VT  AAD hx Amiodarone goes back as far as 2011 for AFib/flutter then stopped 2012 with persistent AFib (not an ablation candidate in review of Dr. ABonita Quinnotes  Amiodarone resumed 2017 for NSVTs  Past Medical History:  Diagnosis Date   Adenomatous colon polyp    Cervical radiculopathy at C5    CHF (congestive heart failure) ([facility]    Chronic systolic heart failure (HCC)    CKD (chronic [place] disease), stage III ([place]    DDD (degenerative disc disease), cervical    DDD (degenerative disc disease), lumbar    Fibroid    GERD (gastroesophageal reflux disease)    Hearing loss 02/2010   L hearing loss/vertigo, steroids   History of seizure disorder    Related Hx   Mitral regurgitation    moderate   Multiple sclerosis [place]   [place]ischemic cardiomyopathy [place]    Moderate LVEF 35-40% by ECHO 2011, 25-30% by echo 2013   Permanent atrial fibrillation ([place]    Pulmonary hypertension, secondary 06/04/2013   As a result of nonischemic cardiomyopathy EF 25-30%   Stroke ([facility]    Right brain CVA, complete recovery 07/2003   Ventricular tachycardia ([place]    Vertigo 02/2010   L hearing loss/vertigo, steroids    Past Surgical History:  Procedure Laterality Date   BIV PACEMAKER GENERATOR CHANGE OUT N/A 09/18/2014   Procedure: BIV  PACEMAKER GENERATOR CHANGE OUT;  Surgeon: Thompson Grayer, MD;  Location: [facility] CATH LAB;  Service: Cardiovascular;  Laterality: N/A;   DILATION AND CURETTAGE OF UTERUS     EP IMPLANTABLE DEVICE N/A 07/28/2016   Procedure: ICD Implant;  Surgeon: Evans Lance, MD;  Location: [place] CV LAB;  Service: Cardiovascular;  Laterality: N/A;   HYSTEROSCOPY     PACEMAKER INSERTION     PTVP 08/2001 for complete heart block. Upgrade PTVP to MDT BiV 06/2008 by Dr Leonia Reeves   RIGHT HEART CATH N/A 05/17/2018   Procedure: RIGHT HEART  CATH;  Surgeon: Jolaine Artist, MD;  Location: [place] CV LAB;  Service: Cardiovascular;  Laterality: N/A;   RIGHT/LEFT HEART CATH AND CORONARY ANGIOGRAPHY N/A 02/02/2018   Procedure: RIGHT/LEFT HEART CATH AND CORONARY ANGIOGRAPHY;  Surgeon: Jolaine Artist, MD;  Location: [place] CV LAB;  Service: Cardiovascular;  Laterality: N/A;   TUBAL LIGATION      Current Outpatient Medications  Medication Sig Dispense Refill   acetaminophen (TYLENOL) 500 MG tablet Take 500-1,000 mg by mouth every 6 (six) hours as needed (for back pain).      ALPHAGAN P 0.1 % SOLN Place 1 drop into both eyes 2 (two) times daily.      amiodarone (PACERONE) 200 MG tablet TAKE 1/2 TABLET BY MOUTH DAILY 15 tablet 0   apixaban (ELIQUIS) 2.5 MG TABS tablet Take 1 tablet (2.5 mg total) by mouth 2 (two) times daily. 60 tablet 0   Biotin 2500 MCG CAPS Take 2,500 mcg by mouth at bedtime.     Calcium Carbonate-Vitamin D (CALCIUM + D PO) Take 1 tablet by mouth every evening.     carvedilol (COREG) 3.125 MG tablet TAKE 1 TABLET BY MOUTH TWICE A DAY WITH FOOD 60 tablet 0   FARXIGA 10 MG TABS tablet TAKE 1 TABLET BY MOUTH EVERY DAY BEFORE BREAKFAST 90 tablet 3   fexofenadine (ALLEGRA) 180 MG tablet Take 180 mg by mouth daily as needed for allergies.      furosemide (LASIX) 40 MG tablet Take 1 tablet (40 mg total) by mouth daily as needed. TAKE 1 TABLET BY MOUTH  DAILY. MAY TAKE EXTRA 1/2  TABLET AS NEEDED FOR  SWELLING. 135 tablet 3   levothyroxine (SYNTHROID, LEVOTHROID) 50 MCG tablet Take 50 mcg by mouth daily before breakfast.      loratadine (CLARITIN) 10 MG tablet Take 10 mg by mouth daily as needed for allergies.     Magnesium 500 MG TABS Take 500 mg by mouth at bedtime.     sacubitril-valsartan (ENTRESTO) 49-51 MG Take 1 tablet by mouth 2 (two) times daily. 180 tablet 3   spironolactone (ALDACTONE) 25 MG tablet TAKE 1/2 TABLET BY MOUTH EVERY DAY 15 tablet 0   No current facility-administered medications for this  visit.    Allergies:   Patient has no known allergies.   Social History:  The patient  reports that she has never smoked. She has never used smokeless tobacco. She reports current alcohol use. She reports that she does not use drugs.   Family History:  The patient's family history includes CAD in an other family member; Cancer in her father; Diabetes in her mother; Multiple sclerosis in her mother.***  ROS:  Please see the history of present illness.    All other systems are reviewed and otherwise negative.   PHYSICAL EXAM:  VS:  LMP  (LMP Unknown)  BMI: There is no height or weight on file  to calculate BMI. Well nourished, well developed, in no acute distress HEENT: normocephalic, atraumatic Neck: no JVD, carotid bruits or masses Cardiac:  *** RRR; no significant murmurs, no rubs, or gallops Lungs:  *** CTA b/l, no wheezing, rhonchi or rales Abd: soft, nontender MS: no deformity or *** atrophy Ext: *** no edema Skin: warm and dry, no rash Neuro:  No gross deficits appreciated Psych: euthymic mood, full affect  *** ICD site is stable, no tethering or discomfort   EKG:  Done today and reviewed by myself shows  ***  Device interrogation done today and reviewed by myself:  ***   TTE 04/05/22  Review of the above records today demonstrates:   1. Left ventricular ejection fraction, by estimation, is 20 to 25%. The  left ventricle has severely decreased function. The left ventricle  demonstrates global hypokinesis. The left ventricular internal cavity size  was moderately dilated. Left  ventricular diastolic parameters are indeterminate.   2. Right ventricular systolic function is normal. The right ventricular  size is normal. There is mildly elevated pulmonary artery systolic  pressure.   3. Left atrial size was severely dilated.   4. Right atrial size was moderately dilated.   5. The mitral valve is normal in structure. Moderate mitral valve  regurgitation. No evidence of  mitral stenosis.   6. The aortic valve is tricuspid. Aortic valve regurgitation is not  visualized. No aortic stenosis is present.   7. The inferior vena cava is dilated in size with >50% respiratory  variability, suggesting right atrial pressure of 8 mmHg.   Echo (9/21): EF 25-30%, severe LV dysfunction, RV ok. Echo (8/20): EF 25% RV normal (Personally reviewed). CPX (10/19): low Vo2 (13.5), high slope (42).   RHC (10/19):  Findings: RA = 12 RV = 53/12 PA = 51/22 (34) PCW = mean 28 (v = 35-40) Fick cardiac output/index = 4.4/2.7 PVR = 1.4 WU FA sat = 98% PA sat = 62%, 63% PAPi = 2.4 RA/PCWP = 0.42  Assessment: 1. NICM with elevated filling pressures and relatively preserved output. 2. Prominent v-waves in PCWP tracing suggestive of significant MR Plan/Discussion: Increase diuretics. Continue transplant w/u based on CPX results.    LHC (02/02/18) with normal coronaries.   Recent Labs: 04/05/2022: ALT 24; B Natriuretic Peptide 221.3; Hemoglobin 11.6; Platelets 122 07/13/2022: BUN 21; Creatinine, Ser 2.02; Magnesium 2.3; Potassium 4.4; Sodium 140  No results found for requested labs within last 365 days.   CrCl cannot be calculated (Unknown ideal weight.).   Wt Readings from Last 3 Encounters:  06/13/22 123 lb 6.4 oz (56 kg)  04/05/22 122 lb 12.8 oz (55.7 kg)  01/13/21 130 lb 6.4 oz (59.1 kg)     Other studies reviewed: Additional studies/records reviewed today include: summarized above  ASSESSMENT AND PLAN:  ICD ***  VT Amiodarone ***  NICM Chronic CHF ***  Paroxysmal AFib CHA2DS2Vasc is 6, on eliquis, *** appropriately dosed *** % burden amiodarone    Disposition: F/u with ***  Current medicines are reviewed at length with the patient today.  The patient did not have any concerns regarding medicines.  Venetia Night, PA-C 07/24/2022 11:56 AM     [facility] [place] [address] [phone]  (office)  [phone] (fax)

## 2022-07-26 ENCOUNTER — Ambulatory Visit: Payer: Medicare PPO | Attending: Physician Assistant | Admitting: Physician Assistant

## 2022-07-26 ENCOUNTER — Encounter: Payer: Self-pay | Admitting: Physician Assistant

## 2022-07-26 VITALS — BP 92/70 | HR 62 | Ht 63.0 in | Wt 126.1 lb

## 2022-07-26 DIAGNOSIS — I5022 Chronic systolic (congestive) heart failure: Secondary | ICD-10-CM

## 2022-07-26 DIAGNOSIS — I428 Other cardiomyopathies: Secondary | ICD-10-CM | POA: Diagnosis not present

## 2022-07-26 DIAGNOSIS — Z95810 Presence of automatic (implantable) cardiac defibrillator: Secondary | ICD-10-CM

## 2022-07-26 DIAGNOSIS — I480 Paroxysmal atrial fibrillation: Secondary | ICD-10-CM

## 2022-07-26 DIAGNOSIS — I472 Ventricular tachycardia, unspecified: Secondary | ICD-10-CM | POA: Diagnosis not present

## 2022-07-26 LAB — CUP PACEART INCLINIC DEVICE CHECK
Battery Remaining Longevity: 5 mo
Battery Voltage: 2.79 V
Brady Statistic AP VP Percent: 99.87 %
Brady Statistic AP VS Percent: 0.13 %
Brady Statistic AS VP Percent: 0 %
Brady Statistic AS VS Percent: 0 %
Brady Statistic RA Percent Paced: 100 %
Brady Statistic RV Percent Paced: 98.91 %
Date Time Interrogation Session: 20231212133355
HighPow Impedance: 37 Ohm
Implantable Lead Connection Status: 753985
Implantable Lead Connection Status: 753985
Implantable Lead Connection Status: 753985
Implantable Lead Implant Date: 20091104
Implantable Lead Implant Date: 20091104
Implantable Lead Implant Date: 20171214
Implantable Lead Location: 753858
Implantable Lead Location: 753859
Implantable Lead Location: 753860
Implantable Lead Model: 4196
Implantable Lead Model: 5076
Implantable Pulse Generator Implant Date: 20171214
Lead Channel Impedance Value: 399 Ohm
Lead Channel Impedance Value: 399 Ohm
Lead Channel Impedance Value: 456 Ohm
Lead Channel Impedance Value: 456 Ohm
Lead Channel Impedance Value: 608 Ohm
Lead Channel Impedance Value: 988 Ohm
Lead Channel Pacing Threshold Amplitude: 0.375 V
Lead Channel Pacing Threshold Amplitude: 0.625 V
Lead Channel Pacing Threshold Amplitude: 2.125 V
Lead Channel Pacing Threshold Pulse Width: 0.4 ms
Lead Channel Pacing Threshold Pulse Width: 0.4 ms
Lead Channel Pacing Threshold Pulse Width: 0.8 ms
Lead Channel Sensing Intrinsic Amplitude: 0.125 mV
Lead Channel Sensing Intrinsic Amplitude: 0.125 mV
Lead Channel Sensing Intrinsic Amplitude: 7.375 mV
Lead Channel Sensing Intrinsic Amplitude: 7.375 mV
Lead Channel Setting Pacing Amplitude: 2 V
Lead Channel Setting Pacing Amplitude: 2.5 V
Lead Channel Setting Pacing Amplitude: 3.25 V
Lead Channel Setting Pacing Pulse Width: 0.4 ms
Lead Channel Setting Pacing Pulse Width: 0.8 ms
Lead Channel Setting Sensing Sensitivity: 0.3 mV
Zone Setting Status: 755011

## 2022-07-26 MED ORDER — AMIODARONE HCL 200 MG PO TABS: 200.0000 mg | ORAL_TABLET | Freq: Every day | ORAL | 3 refills | Status: DC

## 2022-07-26 NOTE — Patient Instructions (Signed)
Medication Instructions:    START TAKING : AMIODARONE  200 MG ONCE   *If you need a refill on your cardiac medications before your next appointment, please call your pharmacy*   Lab Work: NONE ORDERED  TODAY    If you have labs (blood work) drawn today and your tests are completely normal, you will receive your results only by: [place] (if you have MyChart) OR A paper copy in the mail If you have any lab test that is abnormal or we need to change your treatment, we will call you to review the results.   Testing/Procedures: NONE ORDERED  TODAY   Follow-Up: At [facility], you and your health needs are our priority.  As part of our continuing mission to provide you with exceptional heart care, we have created designated Provider Care Teams.  These Care Teams include your primary Cardiologist (physician) and Advanced Practice Providers (APPs -  Physician Assistants and Nurse Practitioners) who all work together to provide you with the care you need, when you need it.  We recommend signing up for the patient portal called "MyChart".  Sign up information is provided on this After Visit Summary.  MyChart is used to connect with patients for Virtual Visits (Telemedicine).  Patients are able to view lab/test results, encounter notes, upcoming appointments, etc.  Non-urgent messages can be sent to your provider as well.   To learn more about what you can do with MyChart, go to [email or web address].    Your next appointment:   2 month(s) ( CONTACT [facility] FOR EP SCHEDULING ISSUES )    The format for your next appointment:   In Person  Provider:   You may see  Dr. Curt Bears  or one of the following Advanced Practice Providers on your designated Care Team:   Tommye Standard, PA-C  Other Instructions =  Important Information About Sugar

## 2022-07-28 ENCOUNTER — Other Ambulatory Visit: Payer: Self-pay | Admitting: Internal Medicine

## 2022-08-04 ENCOUNTER — Ambulatory Visit (INDEPENDENT_AMBULATORY_CARE_PROVIDER_SITE_OTHER): Payer: Medicare PPO

## 2022-08-04 DIAGNOSIS — I428 Other cardiomyopathies: Secondary | ICD-10-CM

## 2022-08-04 LAB — CUP PACEART REMOTE DEVICE CHECK
Battery Remaining Longevity: 4 mo
Battery Voltage: 2.79 V
Brady Statistic AP VP Percent: 99.84 %
Brady Statistic AP VS Percent: 0.16 %
Brady Statistic AS VP Percent: 0 %
Brady Statistic AS VS Percent: 0 %
Brady Statistic RA Percent Paced: 100 %
Brady Statistic RV Percent Paced: 99.27 %
Date Time Interrogation Session: 20231221002202
HighPow Impedance: 39 Ohm
Implantable Lead Connection Status: 753985
Implantable Lead Connection Status: 753985
Implantable Lead Connection Status: 753985
Implantable Lead Implant Date: 20091104
Implantable Lead Implant Date: 20091104
Implantable Lead Implant Date: 20171214
Implantable Lead Location: 753858
Implantable Lead Location: 753859
Implantable Lead Location: 753860
Implantable Lead Model: 4196
Implantable Lead Model: 5076
Implantable Pulse Generator Implant Date: 20171214
Lead Channel Impedance Value: 1083 Ohm
Lead Channel Impedance Value: 399 Ohm
Lead Channel Impedance Value: 456 Ohm
Lead Channel Impedance Value: 475 Ohm
Lead Channel Impedance Value: 513 Ohm
Lead Channel Impedance Value: 665 Ohm
Lead Channel Pacing Threshold Amplitude: 0.375 V
Lead Channel Pacing Threshold Amplitude: 0.625 V
Lead Channel Pacing Threshold Amplitude: 1.75 V
Lead Channel Pacing Threshold Pulse Width: 0.4 ms
Lead Channel Pacing Threshold Pulse Width: 0.4 ms
Lead Channel Pacing Threshold Pulse Width: 0.8 ms
Lead Channel Sensing Intrinsic Amplitude: 0.125 mV
Lead Channel Sensing Intrinsic Amplitude: 0.125 mV
Lead Channel Sensing Intrinsic Amplitude: 7.375 mV
Lead Channel Sensing Intrinsic Amplitude: 7.375 mV
Lead Channel Setting Pacing Amplitude: 2 V
Lead Channel Setting Pacing Amplitude: 2.5 V
Lead Channel Setting Pacing Amplitude: 2.75 V
Lead Channel Setting Pacing Pulse Width: 0.4 ms
Lead Channel Setting Pacing Pulse Width: 0.8 ms
Lead Channel Setting Sensing Sensitivity: 0.3 mV
Zone Setting Status: 755011

## 2022-08-11 ENCOUNTER — Encounter: Payer: Self-pay | Admitting: Cardiology

## 2022-08-12 NOTE — Addendum Note (Signed)
Addended by: Douglass Rivers D on: 08/12/2022 11:55 AM   Modules accepted: Level of Service

## 2022-08-12 NOTE — Telephone Encounter (Signed)
Patient is following up. She just wanted to make sure that someone will have an opportunity to review her message before the weekend.

## 2022-08-12 NOTE — Telephone Encounter (Signed)
Pt informed of providers result & recommendations. Pt verbalized understanding. All questions, if any, were answered. She will continue at present dose

## 2022-08-12 NOTE — Telephone Encounter (Signed)
I would continue on the amiodarone 200 mg daily for now. Dr Curt Bears can review this when he's back. This type of dose change isn't likely to affect blood pressure over the short term. thanks

## 2022-08-12 NOTE — Progress Notes (Signed)
Remote ICD transmission.   

## 2022-08-13 ENCOUNTER — Other Ambulatory Visit: Payer: Self-pay | Admitting: Internal Medicine

## 2022-08-17 ENCOUNTER — Encounter (HOSPITAL_BASED_OUTPATIENT_CLINIC_OR_DEPARTMENT_OTHER): Payer: Self-pay | Admitting: Cardiology

## 2022-08-17 NOTE — Telephone Encounter (Signed)
Routing to correct triage pool 

## 2022-08-21 ENCOUNTER — Other Ambulatory Visit: Payer: Self-pay | Admitting: Internal Medicine

## 2022-08-23 ENCOUNTER — Telehealth: Payer: Self-pay | Admitting: Cardiology

## 2022-08-23 ENCOUNTER — Telehealth: Payer: Medicare PPO | Admitting: Genetic Counselor

## 2022-08-23 NOTE — Telephone Encounter (Signed)
Pt informed that office aware... there was some technical difficulties today w/ the weather. Aware office will call her to reschedule.

## 2022-08-23 NOTE — Telephone Encounter (Signed)
Patient stated she was scheduled for a video visit today and no one came on for the visit.  Patient would like to reschedule this video visit.

## 2022-08-26 NOTE — Progress Notes (Signed)
Remote ICD transmission.   

## 2022-09-02 ENCOUNTER — Ambulatory Visit: Payer: Medicare PPO | Attending: Cardiology

## 2022-09-02 DIAGNOSIS — I428 Other cardiomyopathies: Secondary | ICD-10-CM

## 2022-09-06 ENCOUNTER — Ambulatory Visit: Payer: Medicare PPO | Attending: Cardiology

## 2022-09-06 DIAGNOSIS — I5022 Chronic systolic (congestive) heart failure: Secondary | ICD-10-CM

## 2022-09-06 DIAGNOSIS — Z95810 Presence of automatic (implantable) cardiac defibrillator: Secondary | ICD-10-CM | POA: Diagnosis not present

## 2022-09-06 LAB — CUP PACEART REMOTE DEVICE CHECK
Battery Remaining Longevity: 3 mo
Battery Voltage: 2.77 V
Brady Statistic AP VP Percent: 99.91 %
Brady Statistic AP VS Percent: 0.09 %
Brady Statistic AS VP Percent: 0 %
Brady Statistic AS VS Percent: 0 %
Brady Statistic RA Percent Paced: 100 %
Brady Statistic RV Percent Paced: 99.7 %
Date Time Interrogation Session: 20240122170949
HighPow Impedance: 41 Ohm
Implantable Lead Connection Status: 753985
Implantable Lead Connection Status: 753985
Implantable Lead Connection Status: 753985
Implantable Lead Implant Date: 20091104
Implantable Lead Implant Date: 20091104
Implantable Lead Implant Date: 20171214
Implantable Lead Location: 753858
Implantable Lead Location: 753859
Implantable Lead Location: 753860
Implantable Lead Model: 4196
Implantable Lead Model: 5076
Implantable Pulse Generator Implant Date: 20171214
Lead Channel Impedance Value: 1064 Ohm
Lead Channel Impedance Value: 361 Ohm
Lead Channel Impedance Value: 418 Ohm
Lead Channel Impedance Value: 475 Ohm
Lead Channel Impedance Value: 513 Ohm
Lead Channel Impedance Value: 646 Ohm
Lead Channel Pacing Threshold Amplitude: 0.5 V
Lead Channel Pacing Threshold Amplitude: 0.625 V
Lead Channel Pacing Threshold Amplitude: 1.75 V
Lead Channel Pacing Threshold Pulse Width: 0.4 ms
Lead Channel Pacing Threshold Pulse Width: 0.4 ms
Lead Channel Pacing Threshold Pulse Width: 0.8 ms
Lead Channel Sensing Intrinsic Amplitude: 0.125 mV
Lead Channel Sensing Intrinsic Amplitude: 0.125 mV
Lead Channel Sensing Intrinsic Amplitude: 7.375 mV
Lead Channel Sensing Intrinsic Amplitude: 7.375 mV
Lead Channel Setting Pacing Amplitude: 2 V
Lead Channel Setting Pacing Amplitude: 2.5 V
Lead Channel Setting Pacing Amplitude: 2.75 V
Lead Channel Setting Pacing Pulse Width: 0.4 ms
Lead Channel Setting Pacing Pulse Width: 0.8 ms
Lead Channel Setting Sensing Sensitivity: 0.3 mV
Zone Setting Status: 755011

## 2022-09-06 NOTE — Progress Notes (Signed)
EPIC Encounter for ICM Monitoring  Patient Name: Kristi Parker is a 72 y.o. female Date: 09/06/2022 Primary Care Physican: [name], MD Primary Cardiologist: [name] Electrophysiologist: [name] Bi-V Pacing:  99.7%    01/28/2022 Weight:  118 lbs 03/04/2022 Weight: 118-121 lbs 05/12/2022 Weight: 118.2 lbs 09/06/2022 Weight: 126 lbs   Battery Longevity: 3 months           Spoke with patient and heart failure questions reviewed.  Transmission results reviewed.  Pt was on cruise last week and experienced weight gain and ankle swelling.     Optivol thoracic impedance suggesting possible fluid accumulation starting 1/14 and returned to normal on 1/22 which correlates with patients vacation cruise.    Prescribed:  Furosemide 40 mg take 1 tablet by mouth daily as needed. May take extra 1/2 tablet as needed for swelling. Spironolactone 25 mg take 0.5 tablet (12.5 mg total) daily   Labs: 07/13/2022 Creatinine 2.02, BUN 21, Potassium 4.4, Sodium 140, GFR 26 04/05/2022 Creatinine 2.03, BUN 24, Potassium 4.3, Sodium 139, GFR 26 A complete set of results can be found in Results Review.   Recommendations:  She is taking PRN Furosemide until symptoms resolved.  No changes and encouraged to call if experiencing any fluid symptoms.   Follow-up plan: ICM clinic phone appointment on 10/10/2022.  91 day device clinic remote transmission 12/02/2022.     EP/Cardiology Office Visits:  10/10/2022 with Dr Curt Bears.  Next HF clinic OV due 09/2022 (no recall but OV note says call in December for appt).     Copy of ICM check sent to Dr Curt Bears.   3 month ICM trend: 09/05/2022.    12-14 Month ICM trend:     [name], RN 09/06/2022 8:00 AM

## 2022-09-08 ENCOUNTER — Other Ambulatory Visit: Payer: Self-pay | Admitting: Internal Medicine

## 2022-09-12 ENCOUNTER — Encounter: Payer: Self-pay | Admitting: Cardiology

## 2022-09-13 ENCOUNTER — Other Ambulatory Visit (HOSPITAL_COMMUNITY): Payer: Self-pay

## 2022-09-13 MED ORDER — APIXABAN 2.5 MG PO TABS: 2.5000 mg | ORAL_TABLET | Freq: Two times a day (BID) | ORAL | 0 refills | Status: DC

## 2022-09-19 NOTE — Progress Notes (Signed)
Remote ICD transmission.   

## 2022-09-30 ENCOUNTER — Other Ambulatory Visit: Payer: Self-pay | Admitting: Internal Medicine

## 2022-10-06 ENCOUNTER — Ambulatory Visit (INDEPENDENT_AMBULATORY_CARE_PROVIDER_SITE_OTHER): Payer: Medicare PPO

## 2022-10-06 DIAGNOSIS — I428 Other cardiomyopathies: Secondary | ICD-10-CM

## 2022-10-07 LAB — CUP PACEART REMOTE DEVICE CHECK
Battery Remaining Longevity: 2 mo
Battery Voltage: 2.76 V
Brady Statistic AP VP Percent: 99.85 %
Brady Statistic AP VS Percent: 0.14 %
Brady Statistic AS VP Percent: 0 %
Brady Statistic AS VS Percent: 0 %
Brady Statistic RA Percent Paced: 100 %
Brady Statistic RV Percent Paced: 99.66 %
Date Time Interrogation Session: 20240222033323
HighPow Impedance: 37 Ohm
Implantable Lead Connection Status: 753985
Implantable Lead Connection Status: 753985
Implantable Lead Connection Status: 753985
Implantable Lead Implant Date: 20091104
Implantable Lead Implant Date: 20091104
Implantable Lead Implant Date: 20171214
Implantable Lead Location: 753858
Implantable Lead Location: 753859
Implantable Lead Location: 753860
Implantable Lead Model: 4196
Implantable Lead Model: 5076
Implantable Pulse Generator Implant Date: 20171214
Lead Channel Impedance Value: 342 Ohm
Lead Channel Impedance Value: 418 Ohm
Lead Channel Impedance Value: 456 Ohm
Lead Channel Impedance Value: 475 Ohm
Lead Channel Impedance Value: 608 Ohm
Lead Channel Impedance Value: 988 Ohm
Lead Channel Pacing Threshold Amplitude: 0.5 V
Lead Channel Pacing Threshold Amplitude: 0.625 V
Lead Channel Pacing Threshold Amplitude: 1.75 V
Lead Channel Pacing Threshold Pulse Width: 0.4 ms
Lead Channel Pacing Threshold Pulse Width: 0.4 ms
Lead Channel Pacing Threshold Pulse Width: 0.8 ms
Lead Channel Sensing Intrinsic Amplitude: 0.125 mV
Lead Channel Sensing Intrinsic Amplitude: 0.125 mV
Lead Channel Sensing Intrinsic Amplitude: 7.375 mV
Lead Channel Sensing Intrinsic Amplitude: 7.375 mV
Lead Channel Setting Pacing Amplitude: 2 V
Lead Channel Setting Pacing Amplitude: 2.5 V
Lead Channel Setting Pacing Amplitude: 2.75 V
Lead Channel Setting Pacing Pulse Width: 0.4 ms
Lead Channel Setting Pacing Pulse Width: 0.8 ms
Lead Channel Setting Sensing Sensitivity: 0.3 mV
Zone Setting Status: 755011

## 2022-10-09 ENCOUNTER — Encounter: Payer: Self-pay | Admitting: Cardiology

## 2022-10-10 ENCOUNTER — Encounter: Payer: Medicare PPO | Admitting: Cardiology

## 2022-10-10 ENCOUNTER — Ambulatory Visit: Payer: Medicare PPO | Attending: Cardiology

## 2022-10-10 DIAGNOSIS — I5022 Chronic systolic (congestive) heart failure: Secondary | ICD-10-CM | POA: Diagnosis not present

## 2022-10-10 DIAGNOSIS — Z95810 Presence of automatic (implantable) cardiac defibrillator: Secondary | ICD-10-CM

## 2022-10-10 NOTE — Telephone Encounter (Signed)
Pt called in today to r/s.

## 2022-10-12 ENCOUNTER — Other Ambulatory Visit: Payer: Self-pay | Admitting: Internal Medicine

## 2022-10-14 NOTE — Progress Notes (Signed)
EPIC Encounter for ICM Monitoring  Patient Name: Kristi Parker is a 72 y.o. female Date: 10/14/2022 Primary Care Physican: [name], MD Primary Cardiologist: [name] Electrophysiologist: [name] Bi-V Pacing:  99.7%    01/28/2022 Weight:  118 lbs 03/04/2022 Weight: 118-121 lbs 05/12/2022 Weight: 118.2 lbs 09/06/2022 Weight: 126 lbs 10/14/2022 Weight: 125 lbs   Battery Longevity: 2 months           Spoke with patient and heart failure questions reviewed.  Transmission results reviewed.  Pt takes PRN Furosemide when she has weight gain or ankle swelling but feeling fine right now.    Optivol thoracic impedance suggesting normal fluid accumulation.    Prescribed:  Furosemide 40 mg take 1 tablet by mouth daily as needed. May take extra 1/2 tablet as needed for swelling. Spironolactone 25 mg take 0.5 tablet (12.5 mg total) daily   Labs: 07/13/2022 Creatinine 2.02, BUN 21, Potassium 4.4, Sodium 140, GFR 26 04/05/2022 Creatinine 2.03, BUN 24, Potassium 4.3, Sodium 139, GFR 26 A complete set of results can be found in Results Review.   Recommendations:  No changes and encouraged to call if experiencing any fluid symptoms.   Follow-up plan: ICM clinic phone appointment on 11/14/2022.  91 day device clinic remote transmission 12/02/2022.     EP/Cardiology Office Visits:  10/28/2022 with Dr Curt Bears.  Next HF clinic OV due 09/2022 (no recall but OV note says call in December for appt).     Copy of ICM check sent to Dr Curt Bears.   3 month ICM trend: 10/10/2022.    12-14 Month ICM trend:     [name], RN 10/14/2022 4:17 PM

## 2022-10-15 ENCOUNTER — Other Ambulatory Visit (HOSPITAL_COMMUNITY): Payer: Self-pay | Admitting: Internal Medicine

## 2022-10-28 ENCOUNTER — Encounter: Payer: Self-pay | Admitting: Cardiology

## 2022-10-28 ENCOUNTER — Ambulatory Visit: Payer: Medicare PPO | Attending: Cardiology | Admitting: Cardiology

## 2022-10-28 VITALS — BP 110/74 | HR 72 | Ht 63.0 in | Wt 130.0 lb

## 2022-10-28 DIAGNOSIS — I5022 Chronic systolic (congestive) heart failure: Secondary | ICD-10-CM | POA: Diagnosis not present

## 2022-10-28 DIAGNOSIS — I472 Ventricular tachycardia, unspecified: Secondary | ICD-10-CM | POA: Diagnosis not present

## 2022-10-28 DIAGNOSIS — D6869 Other thrombophilia: Secondary | ICD-10-CM | POA: Diagnosis not present

## 2022-10-28 DIAGNOSIS — I480 Paroxysmal atrial fibrillation: Secondary | ICD-10-CM

## 2022-10-28 LAB — CUP PACEART INCLINIC DEVICE CHECK
Battery Remaining Longevity: 2 mo
Battery Voltage: 2.73 V
Brady Statistic AP VP Percent: 99.84 %
Brady Statistic AP VS Percent: 0.16 %
Brady Statistic AS VP Percent: 0 %
Brady Statistic AS VS Percent: 0 %
Brady Statistic RA Percent Paced: 100 %
Brady Statistic RV Percent Paced: 99.62 %
Date Time Interrogation Session: 20240315164049
HighPow Impedance: 37 Ohm
Implantable Lead Connection Status: 753985
Implantable Lead Connection Status: 753985
Implantable Lead Connection Status: 753985
Implantable Lead Implant Date: 20091104
Implantable Lead Implant Date: 20091104
Implantable Lead Implant Date: 20171214
Implantable Lead Location: 753858
Implantable Lead Location: 753859
Implantable Lead Location: 753860
Implantable Lead Model: 4196
Implantable Lead Model: 5076
Implantable Pulse Generator Implant Date: 20171214
Lead Channel Impedance Value: 1064 Ohm
Lead Channel Impedance Value: 361 Ohm
Lead Channel Impedance Value: 418 Ohm
Lead Channel Impedance Value: 456 Ohm
Lead Channel Impedance Value: 475 Ohm
Lead Channel Impedance Value: 703 Ohm
Lead Channel Pacing Threshold Amplitude: 0.375 V
Lead Channel Pacing Threshold Amplitude: 0.625 V
Lead Channel Pacing Threshold Amplitude: 1.625 V
Lead Channel Pacing Threshold Pulse Width: 0.4 ms
Lead Channel Pacing Threshold Pulse Width: 0.4 ms
Lead Channel Pacing Threshold Pulse Width: 0.8 ms
Lead Channel Sensing Intrinsic Amplitude: 0.125 mV
Lead Channel Sensing Intrinsic Amplitude: 0.125 mV
Lead Channel Sensing Intrinsic Amplitude: 7.375 mV
Lead Channel Sensing Intrinsic Amplitude: 7.375 mV
Lead Channel Setting Pacing Amplitude: 2 V
Lead Channel Setting Pacing Amplitude: 2.5 V
Lead Channel Setting Pacing Amplitude: 2.75 V
Lead Channel Setting Pacing Pulse Width: 0.4 ms
Lead Channel Setting Pacing Pulse Width: 0.8 ms
Lead Channel Setting Sensing Sensitivity: 0.3 mV
Zone Setting Status: 755011

## 2022-10-28 NOTE — Progress Notes (Signed)
Electrophysiology Office Note   Date:  10/28/2022   ID:  Kristi Parker, Kristi Parker 05-25-1951, MRN [id number]  PCP:  [name], MD  Cardiologist:  [place] Primary Electrophysiologist:  Dontavious Emily Meredith Leeds, MD    Chief Complaint: CHF   History of Present Illness: Kristi Parker is a 72 y.o. female who is being seen today fo as a r the evaluation of CHF at the request of [name], MD. Presenting today for electrophysiology evaluation.  He has a history significant chronic systolic heart failure due to nonischemic cardiomyopathy, VT, CVA in 2014, atrial fibrillation, CKD stage III-IV.  Heart failure is diagnosed in 2013.  She is status post Medtronic CRT-D.  Today, denies symptoms of palpitations, chest pain, shortness of breath, orthopnea, PND, lower extremity edema, claudication, dizziness, presyncope, syncope, bleeding, or neurologic sequela. The patient is tolerating medications without difficulties.  Since being seen she has done well.  She has had no further episodes of ventricular tachycardia.  Her amiodarone was increased which she has tolerated.  Otherwise she has no acute complaints.    Past Medical History:  Diagnosis Date   Adenomatous colon polyp    Cervical radiculopathy at C5    CHF (congestive heart failure) (HCC)    Chronic systolic heart failure (HCC)    CKD (chronic kidney disease), stage III (HCC)    DDD (degenerative disc disease), cervical    DDD (degenerative disc disease), lumbar    Fibroid    GERD (gastroesophageal reflux disease)    Hearing loss 02/2010   L hearing loss/vertigo, steroids   History of seizure disorder    Related Hx   Mitral regurgitation    moderate   Multiple sclerosis ([place]) 1984   Nonischemic cardiomyopathy (HCC)    Moderate LVEF 35-40% by ECHO 2011, 25-30% by echo 2013   Permanent atrial fibrillation ([place])    Pulmonary hypertension, secondary 06/04/2013   As a result of nonischemic cardiomyopathy EF 25-30%   Stroke  [facility])    Right brain CVA, complete recovery 07/2003   Ventricular tachycardia ([place])    Vertigo 02/2010   L hearing loss/vertigo, steroids   Past Surgical History:  Procedure Laterality Date   BIV PACEMAKER GENERATOR CHANGE OUT N/A 09/18/2014   Procedure: BIV PACEMAKER GENERATOR CHANGE OUT;  Surgeon: Thompson Grayer, MD;  Location: [place] CATH LAB;  Service: Cardiovascular;  Laterality: N/A;   DILATION AND CURETTAGE OF UTERUS     EP IMPLANTABLE DEVICE N/A 07/28/2016   Procedure: ICD Implant;  Surgeon: Evans Lance, MD;  Location: [place] CV LAB;  Service: Cardiovascular;  Laterality: N/A;   HYSTEROSCOPY     PACEMAKER INSERTION     PTVP 08/2001 for complete heart block. Upgrade PTVP to MDT BiV 06/2008 by Dr Leonia Reeves   RIGHT HEART CATH N/A 05/17/2018   Procedure: RIGHT HEART CATH;  Surgeon: Jolaine Artist, MD;  Location: [place] CV LAB;  Service: Cardiovascular;  Laterality: N/A;   RIGHT/LEFT HEART CATH AND CORONARY ANGIOGRAPHY N/A 02/02/2018   Procedure: RIGHT/LEFT HEART CATH AND CORONARY ANGIOGRAPHY;  Surgeon: Jolaine Artist, MD;  Location: [place] CV LAB;  Service: Cardiovascular;  Laterality: N/A;   TUBAL LIGATION       Current Outpatient Medications  Medication Sig Dispense Refill   acetaminophen (TYLENOL) 500 MG tablet Take 500-1,000 mg by mouth every 6 (six) hours as needed (for back pain).      ALPHAGAN P 0.1 % SOLN Place 1 drop into both eyes 2 (two)  times daily.      amiodarone (PACERONE) 200 MG tablet Take 1 tablet (200 mg total) by mouth daily. 90 tablet 3   Biotin 2500 MCG CAPS Take 2,500 mcg by mouth at bedtime.     Calcium Carbonate-Vitamin D (CALCIUM + D PO) Take 1 tablet by mouth every evening.     carvedilol (COREG) 3.125 MG tablet TAKE 1 TABLET BY MOUTH TWICE A DAY WITH FOOD 180 tablet 1   ELIQUIS 2.5 MG TABS tablet TAKE 1 TABLET BY MOUTH TWICE A DAY 180 tablet 0   FARXIGA 10 MG TABS tablet TAKE 1 TABLET BY MOUTH EVERY DAY BEFORE BREAKFAST 90 tablet 3    fexofenadine (ALLEGRA) 180 MG tablet Take 180 mg by mouth daily as needed for allergies.      furosemide (LASIX) 40 MG tablet Take 1 tablet (40 mg total) by mouth daily as needed. TAKE 1 TABLET BY MOUTH  DAILY. MAY TAKE EXTRA 1/2  TABLET AS NEEDED FOR  SWELLING. 135 tablet 3   levothyroxine (SYNTHROID, LEVOTHROID) 50 MCG tablet Take 50 mcg by mouth daily before breakfast.      loratadine (CLARITIN) 10 MG tablet Take 10 mg by mouth daily as needed for allergies.     Magnesium 500 MG TABS Take 500 mg by mouth at bedtime.     sacubitril-valsartan (ENTRESTO) 49-51 MG Take 1 tablet by mouth 2 (two) times daily. 180 tablet 3   spironolactone (ALDACTONE) 25 MG tablet TAKE 1/2 TABLET BY MOUTH DAILY 45 tablet 1   No current facility-administered medications for this visit.    Allergies:   Patient has no known allergies.   Social History:  The patient  reports that she has never smoked. She has never used smokeless tobacco. She reports current alcohol use. She reports that she does not use drugs.   Family History:  The patient's family history includes CAD in an other family member; Cancer in her father; Diabetes in her mother; Multiple sclerosis in her mother.    ROS:  Please see the history of present illness.   Otherwise, review of systems is positive for none.   All other systems are reviewed and negative.   PHYSICAL EXAM: VS:  BP 110/74   Pulse 72   Ht 5\' 3"  (1.6 m)   Wt 130 lb (59 kg)   LMP  (LMP Unknown)   SpO2 98%   BMI 23.03 kg/m  , BMI Body mass index is 23.03 kg/m. GEN: Well nourished, well developed, in no acute distress  HEENT: normal  Neck: no JVD, carotid bruits, or masses Cardiac: RRR; no murmurs, rubs, or gallops,no edema  Respiratory:  clear to auscultation bilaterally, normal work of breathing GI: soft, nontender, nondistended, + BS MS: no deformity or atrophy  Skin: warm and dry, device site well healed Neuro:  Strength and sensation are intact Psych: euthymic mood,  full affect  EKG:  EKG is ordered today. Personal review of the ekg ordered shows AV paced  Personal review of the device interrogation today. Results in [place]: 04/05/2022: ALT 24; B Natriuretic Peptide 221.3; Hemoglobin 11.6; Platelets 122 07/13/2022: BUN 21; Creatinine, Ser 2.02; Magnesium 2.3; Potassium 4.4; Sodium 140    Lipid Panel     Component Value Date/Time   CHOL 181 02/20/2017 0955   TRIG 68 02/20/2017 0955   HDL 66 02/20/2017 0955   CHOLHDL 2.7 02/20/2017 0955   CHOLHDL 4 07/18/2013 1100   VLDL 24.4 07/18/2013 1100  LDLCALC 101 (H) 02/20/2017 0955     Wt Readings from Last 3 Encounters:  10/28/22 130 lb (59 kg)  07/26/22 126 lb 2 oz (57.2 kg)  06/13/22 123 lb 6.4 oz (56 kg)      Other studies Reviewed: Additional studies/ records that were reviewed today include: TTE 04/05/22  Review of the above records today demonstrates:   1. Left ventricular ejection fraction, by estimation, is 20 to 25%. The  left ventricle has severely decreased function. The left ventricle  demonstrates global hypokinesis. The left ventricular internal cavity size  was moderately dilated. Left  ventricular diastolic parameters are indeterminate.   2. Right ventricular systolic function is normal. The right ventricular  size is normal. There is mildly elevated pulmonary artery systolic  pressure.   3. Left atrial size was severely dilated.   4. Right atrial size was moderately dilated.   5. The mitral valve is normal in structure. Moderate mitral valve  regurgitation. No evidence of mitral stenosis.   6. The aortic valve is tricuspid. Aortic valve regurgitation is not  visualized. No aortic stenosis is present.   7. The inferior vena cava is dilated in size with >50% respiratory  variability, suggesting right atrial pressure of 8 mmHg.    ASSESSMENT AND PLAN:  1.  Chronic systolic heart failure: Due to nonischemic cardiomyopathy.  Status post Medtronic CRT-D  implanted 07/28/2016.  Currently on optimal medical therapy as above.  Sensing, threshold, impedance within normal limits.  No changes.  She has 2 months left to ERI.  Risk and benefits of generator change have been discussed.  Risk of bleeding, infection, among others.  She understands risk and is agreed to the procedure.  2.  Paroxysmal atrial fibrillation: Currently on Eliquis and amiodarone.  CHA2DS2-VASc of 5.  Minimal episodes noted  3.  Secondary hypercoagulable state: Currently on Eliquis for atrial fibrillation  4.  High risk medication monitoring: Currently on amiodarone.  Recent labs within normal limits  5.  Ventricular tachycardia: Currently on amiodarone.  No further episodes.  Dajanique Robley likely reduce amiodarone dose after generator change.  Current medicines are reviewed at length with the patient today.   The patient does not have concerns regarding her medicines.  The following changes were made today: None  Labs/ tests ordered today include:  Orders Placed This Encounter  Procedures   EKG 12-Lead     Disposition:   FU 6 months  Signed, Terrian Sentell Meredith Leeds, MD  10/28/2022 3:32 PM     [address] [place] [id number] [phone] (office) [phone] (fax)

## 2022-10-28 NOTE — Patient Instructions (Signed)
Medication Instructions:  Your physician recommends that you continue on your current medications as directed. Please refer to the Current Medication list given to you today.  *If you need a refill on your cardiac medications before your next appointment, please call your pharmacy*   Lab Work: None ordered  Testing/Procedures: None ordered   Follow-Up: At [facility], you and your health needs are our priority.  As part of our continuing mission to provide you with exceptional heart care, we have created designated Provider Care Teams.  These Care Teams include your primary Cardiologist (physician) and Advanced Practice Providers (APPs -  Physician Assistants and Nurse Practitioners) who all work together to provide you with the care you need, when you need it.  Your next appointment:   6 month(s)  The format for your next appointment:   In Person  Provider:   Will [name], MD    Thank you for choosing [facility]!!   Deeksha Cotrell, RN [phone]   Other Instructions     

## 2022-10-31 NOTE — Addendum Note (Signed)
Addended by: Cheri Kearns A on: 10/31/2022 01:24 PM   Modules accepted: Level of Service

## 2022-10-31 NOTE — Progress Notes (Signed)
Remote ICD transmission.   

## 2022-11-08 ENCOUNTER — Encounter: Payer: Medicare PPO | Admitting: Cardiology

## 2022-11-14 ENCOUNTER — Ambulatory Visit: Payer: Medicare PPO | Attending: Cardiology

## 2022-11-14 ENCOUNTER — Telehealth: Payer: Self-pay

## 2022-11-14 DIAGNOSIS — Z95810 Presence of automatic (implantable) cardiac defibrillator: Secondary | ICD-10-CM | POA: Diagnosis not present

## 2022-11-14 DIAGNOSIS — I5022 Chronic systolic (congestive) heart failure: Secondary | ICD-10-CM | POA: Diagnosis not present

## 2022-11-14 NOTE — Telephone Encounter (Signed)
ICD reached RRT 11/12/22. Routing to scheduling to schedule.

## 2022-11-15 ENCOUNTER — Other Ambulatory Visit: Payer: Self-pay | Admitting: Internal Medicine

## 2022-11-16 ENCOUNTER — Encounter: Payer: Self-pay | Admitting: Cardiology

## 2022-11-16 NOTE — Progress Notes (Signed)
EPIC Encounter for ICM Monitoring  Patient Name: Kristi Parker is a 72 y.o. female Date: 11/16/2022 Primary Care Physican: [name], MD Primary Cardiologist: [name] Electrophysiologist: [name] Bi-V Pacing:  99.8%    01/28/2022 Weight:  118 lbs 03/04/2022 Weight: 118-121 lbs 05/12/2022 Weight: 118.2 lbs 09/06/2022 Weight: 126 lbs 10/14/2022 Weight: 125 lbs   Battery Longevity: Reached RRT on 3/30           Spoke with patient and heart failure questions reviewed.  Transmission results reviewed.  Pt asymptomatic for fluid accumulation.  Reports feeling well at this time and voices no complaints.  Pt hears the battery alarm daily since 3/30.  Advised she will need to make an appointment with device nurse to to turn off the alarm or she can wait until the battery is replaced by Dr Curt Bears.  Advised she still has 3 months battery time after the alert sounds.     Optivol thoracic impedance suggesting normal fluid accumulation.    Prescribed:  Furosemide 40 mg take 1 tablet by mouth daily as needed. May take extra 1/2 tablet as needed for swelling. Spironolactone 25 mg take 0.5 tablet (12.5 mg total) daily   Labs: 07/13/2022 Creatinine 2.02, BUN 21, Potassium 4.4, Sodium 140, GFR 26 04/05/2022 Creatinine 2.03, BUN 24, Potassium 4.3, Sodium 139, GFR 26 A complete set of results can be found in Results Review.   Recommendations:  No changes and encouraged to call if experiencing any fluid symptoms.  Advised the office will call her to set up a date for the battery replacement procedure.     Follow-up plan: ICM clinic phone appointment on 12/19/2022.  91 day device clinic remote transmission 12/02/2022.     EP/Cardiology Office Visits:   Next HF clinic OV due 09/2022 (no recall but OV note says call in December for appt).     Copy of ICM check sent to Dr Curt Bears.   3 month ICM trend: 11/14/2022.    12-14 Month ICM trend:     [name], RN 11/16/2022 12:38 PM

## 2022-11-21 ENCOUNTER — Telehealth: Payer: Self-pay

## 2022-11-21 NOTE — Telephone Encounter (Signed)
Pt scheduled for 6/11. Aware I will be in touch to review instructions. Patient verbalized understanding and agreeable to plan.

## 2022-11-21 NOTE — Telephone Encounter (Signed)
The patient was calling because she do not know why it is taking so long for someone to call and set up her battery change out. I told her I will have Dr. Elberta Fortis nurse give her a call back.

## 2022-12-01 NOTE — Telephone Encounter (Signed)
Pt informed that we spoke on 4/8 and she agreed to 6/11. Aware it may be several more weeks before she hears from our office to go over instructions.  Informed that we are working on May now, our scheduler is out of the office for several more weeks and so it may be awhile before we call. Pt agreeable to plan.

## 2022-12-01 NOTE — Telephone Encounter (Signed)
Pt left a voicemail stating she never got a response about possibly having a gen change in June but did not get a confirmation. She would like for the nurse to give her a call back at [phone].

## 2022-12-02 ENCOUNTER — Ambulatory Visit (INDEPENDENT_AMBULATORY_CARE_PROVIDER_SITE_OTHER): Payer: Medicare PPO

## 2022-12-02 DIAGNOSIS — I428 Other cardiomyopathies: Secondary | ICD-10-CM

## 2022-12-02 LAB — CUP PACEART REMOTE DEVICE CHECK
Battery Remaining Longevity: 1 mo — CL
Battery Voltage: 2.69 V
Brady Statistic AP VP Percent: 99.94 %
Brady Statistic AP VS Percent: 0.06 %
Brady Statistic AS VP Percent: 0 %
Brady Statistic AS VS Percent: 0 %
Brady Statistic RA Percent Paced: 100 %
Brady Statistic RV Percent Paced: 99.56 %
Date Time Interrogation Session: 20240419033523
HighPow Impedance: 38 Ohm
Implantable Lead Connection Status: 753985
Implantable Lead Connection Status: 753985
Implantable Lead Connection Status: 753985
Implantable Lead Implant Date: 20091104
Implantable Lead Implant Date: 20091104
Implantable Lead Implant Date: 20171214
Implantable Lead Location: 753858
Implantable Lead Location: 753859
Implantable Lead Location: 753860
Implantable Lead Model: 4196
Implantable Lead Model: 5076
Implantable Pulse Generator Implant Date: 20171214
Lead Channel Impedance Value: 1064 Ohm
Lead Channel Impedance Value: 361 Ohm
Lead Channel Impedance Value: 361 Ohm
Lead Channel Impedance Value: 418 Ohm
Lead Channel Impedance Value: 475 Ohm
Lead Channel Impedance Value: 665 Ohm
Lead Channel Pacing Threshold Amplitude: 0.375 V
Lead Channel Pacing Threshold Amplitude: 0.625 V
Lead Channel Pacing Threshold Amplitude: 1.5 V
Lead Channel Pacing Threshold Pulse Width: 0.4 ms
Lead Channel Pacing Threshold Pulse Width: 0.4 ms
Lead Channel Pacing Threshold Pulse Width: 0.8 ms
Lead Channel Sensing Intrinsic Amplitude: 0.125 mV
Lead Channel Sensing Intrinsic Amplitude: 0.125 mV
Lead Channel Sensing Intrinsic Amplitude: 2.25 mV
Lead Channel Sensing Intrinsic Amplitude: 2.25 mV
Lead Channel Setting Pacing Amplitude: 2 V
Lead Channel Setting Pacing Amplitude: 2.5 V
Lead Channel Setting Pacing Amplitude: 2.75 V
Lead Channel Setting Pacing Pulse Width: 0.4 ms
Lead Channel Setting Pacing Pulse Width: 0.8 ms
Lead Channel Setting Sensing Sensitivity: 0.3 mV
Zone Setting Status: 755011

## 2022-12-19 ENCOUNTER — Ambulatory Visit: Payer: Medicare PPO | Attending: Cardiology

## 2022-12-19 DIAGNOSIS — I5022 Chronic systolic (congestive) heart failure: Secondary | ICD-10-CM | POA: Diagnosis not present

## 2022-12-19 DIAGNOSIS — Z95810 Presence of automatic (implantable) cardiac defibrillator: Secondary | ICD-10-CM | POA: Diagnosis not present

## 2022-12-20 ENCOUNTER — Telehealth: Payer: Self-pay

## 2022-12-20 NOTE — Progress Notes (Signed)
EPIC Encounter for ICM Monitoring  Patient Name: Kristi Parker is a 72 y.o. female Date: 12/20/2022 Primary Care Physican: Kirby Funk, MD Electrophysiologist: Kathreen Cornfield Pacing:  99.7%    01/28/2022 Weight:  118 lbs 03/04/2022 Weight: 118-121 lbs 05/12/2022 Weight: 118.2 lbs 09/06/2022 Weight: 126 lbs 10/14/2022 Weight: 125 lbs   Battery Longevity: Reached RRT on 3/30           Attempted call to patient and unable to reach.  Left detailed message per DPR regarding transmission. Transmission reviewed.   Device battery replacement scheduled 6/11.   Optivol thoracic impedance suggesting normal fluid accumulation.    Prescribed:  Furosemide 40 mg take 1 tablet by mouth daily as needed. May take extra 1/2 tablet as needed for swelling. Spironolactone 25 mg take 0.5 tablet (12.5 mg total) daily   Labs: 07/13/2022 Creatinine 2.02, BUN 21, Potassium 4.4, Sodium 140, GFR 26 04/05/2022 Creatinine 2.03, BUN 24, Potassium 4.3, Sodium 139, GFR 26 A complete set of results can be found in Results Review.   Recommendations:  Left voice mail with ICM number and encouraged to call if experiencing any fluid symptoms..     Follow-up plan: ICM clinic phone appointment on 01/23/2023.  91 day device clinic remote transmission pending battery replacement.     EP/Cardiology Office Visits:   Next HF clinic OV due 09/2022 (no recall but OV note says call in December for appt).     Copy of ICM check sent to Dr Elberta Fortis.    3 month ICM trend: 12/19/2022.    12-14 Month ICM trend:     Karie Soda, RN 12/20/2022 3:00 PM

## 2022-12-20 NOTE — Telephone Encounter (Signed)
Remote ICM transmission received.  Attempted call to patient regarding ICM remote transmission and left detailed message per DPR.  Advised to return call for any fluid symptoms or questions. Next ICM remote transmission scheduled 01/23/2023.    

## 2022-12-26 ENCOUNTER — Ambulatory Visit: Payer: Medicare PPO | Attending: Genetic Counselor | Admitting: Genetic Counselor

## 2022-12-26 DIAGNOSIS — I428 Other cardiomyopathies: Secondary | ICD-10-CM

## 2022-12-28 ENCOUNTER — Encounter: Payer: Self-pay | Admitting: *Deleted

## 2022-12-28 ENCOUNTER — Ambulatory Visit: Payer: Medicare PPO | Attending: Cardiology

## 2022-12-28 ENCOUNTER — Other Ambulatory Visit: Payer: Self-pay | Admitting: *Deleted

## 2022-12-28 DIAGNOSIS — I480 Paroxysmal atrial fibrillation: Secondary | ICD-10-CM

## 2022-12-29 LAB — BASIC METABOLIC PANEL
BUN/Creatinine Ratio: 16 (ref 12–28)
BUN: 34 mg/dL — ABNORMAL HIGH (ref 8–27)
CO2: 23 mmol/L (ref 20–29)
Calcium: 10 mg/dL (ref 8.7–10.3)
Chloride: 106 mmol/L (ref 96–106)
Creatinine, Ser: 2.18 mg/dL — ABNORMAL HIGH (ref 0.57–1.00)
Glucose: 71 mg/dL (ref 70–99)
Potassium: 5.1 mmol/L (ref 3.5–5.2)
Sodium: 140 mmol/L (ref 134–144)
eGFR: 24 mL/min/{1.73_m2} — ABNORMAL LOW (ref >59)

## 2022-12-29 LAB — CBC
Hematocrit: 31.9 % — ABNORMAL LOW (ref 34.0–46.6)
Hemoglobin: 11.1 g/dL (ref 11.1–15.9)
MCH: 33.1 pg — ABNORMAL HIGH (ref 26.6–33.0)
MCHC: 34.8 g/dL (ref 31.5–35.7)
MCV: 95 fL (ref 79–97)
Platelets: 124 10*3/uL — ABNORMAL LOW (ref 150–450)
RBC: 3.35 x10E6/uL — ABNORMAL LOW (ref 3.77–5.28)
RDW: 13.2 % (ref 11.7–15.4)
WBC: 4.6 10*3/uL (ref 3.4–10.8)

## 2022-12-30 NOTE — Progress Notes (Signed)
Remote ICD transmission.   

## 2023-01-11 ENCOUNTER — Encounter: Payer: Self-pay | Admitting: Cardiology

## 2023-01-17 ENCOUNTER — Other Ambulatory Visit: Payer: Self-pay | Admitting: Internal Medicine

## 2023-01-23 ENCOUNTER — Ambulatory Visit: Payer: Medicare PPO | Attending: Cardiology

## 2023-01-23 ENCOUNTER — Telehealth: Payer: Self-pay

## 2023-01-23 DIAGNOSIS — Z95810 Presence of automatic (implantable) cardiac defibrillator: Secondary | ICD-10-CM

## 2023-01-23 DIAGNOSIS — I5022 Chronic systolic (congestive) heart failure: Secondary | ICD-10-CM | POA: Diagnosis not present

## 2023-01-23 NOTE — Pre-Procedure Instructions (Signed)
Instructed patient on the following items: Arrival time 0930 Nothing to eat or drink after midnight No meds AM of procedure Responsible person to drive you home and stay with you for 24 hrs Wash with special soap night before and morning of procedure If on anti-coagulant drug instructions Eliquis- last dose was 6/8, Farixga last dose was 6/7.

## 2023-01-23 NOTE — Telephone Encounter (Signed)
Pt aware of the time change of her procedure. She will arrive at 9:30 AM.

## 2023-01-24 ENCOUNTER — Ambulatory Visit (HOSPITAL_COMMUNITY)
Admission: RE | Admit: 2023-01-24 | Discharge: 2023-01-24 | Disposition: A | Discharge status: Home / Self Care | Payer: Medicare PPO | Attending: Cardiology | Admitting: Cardiology

## 2023-01-24 ENCOUNTER — Ambulatory Visit (HOSPITAL_COMMUNITY)
Admission: RE | Disposition: A | Discharge status: Home / Self Care | Payer: Self-pay | Source: Home / Self Care | Attending: Cardiology

## 2023-01-24 DIAGNOSIS — I472 Ventricular tachycardia, unspecified: Secondary | ICD-10-CM | POA: Diagnosis not present

## 2023-01-24 DIAGNOSIS — I441 Atrioventricular block, second degree: Secondary | ICD-10-CM | POA: Insufficient documentation

## 2023-01-24 DIAGNOSIS — Z4502 Encounter for adjustment and management of automatic implantable cardiac defibrillator: Secondary | ICD-10-CM | POA: Diagnosis present

## 2023-01-24 DIAGNOSIS — I4892 Unspecified atrial flutter: Secondary | ICD-10-CM | POA: Insufficient documentation

## 2023-01-24 DIAGNOSIS — I4821 Permanent atrial fibrillation: Secondary | ICD-10-CM | POA: Insufficient documentation

## 2023-01-24 DIAGNOSIS — Z8673 Personal history of transient ischemic attack (TIA), and cerebral infarction without residual deficits: Secondary | ICD-10-CM | POA: Diagnosis not present

## 2023-01-24 DIAGNOSIS — I428 Other cardiomyopathies: Secondary | ICD-10-CM | POA: Insufficient documentation

## 2023-01-24 DIAGNOSIS — I5022 Chronic systolic (congestive) heart failure: Secondary | ICD-10-CM | POA: Insufficient documentation

## 2023-01-24 DIAGNOSIS — N183 Chronic kidney disease, stage 3 unspecified: Secondary | ICD-10-CM | POA: Insufficient documentation

## 2023-01-24 HISTORY — PX: BIV ICD GENERATOR CHANGEOUT: EP1194

## 2023-01-24 SURGERY — BIV ICD GENERATOR CHANGEOUT

## 2023-01-24 MED ORDER — CEFAZOLIN SODIUM-DEXTROSE 2-4 GM/100ML-% IV SOLN
2.0000 g | INTRAVENOUS | Status: AC
  Administered 2023-01-24: 2 g via INTRAVENOUS

## 2023-01-24 MED ORDER — SODIUM CHLORIDE 0.9 % IV SOLN
80.0000 mg | INTRAVENOUS | Status: AC
  Administered 2023-01-24: 80 mg

## 2023-01-24 MED ORDER — LIDOCAINE HCL (PF) 1 % IJ SOLN
INTRAMUSCULAR | Status: AC
  Filled 2023-01-24: qty 30

## 2023-01-24 MED ORDER — SODIUM CHLORIDE 0.9 % IV SOLN
INTRAVENOUS | Status: AC
  Filled 2023-01-24: qty 2

## 2023-01-24 MED ORDER — SODIUM CHLORIDE 0.9 % IV SOLN: INTRAVENOUS | Status: DC

## 2023-01-24 MED ORDER — FENTANYL CITRATE (PF) 100 MCG/2ML IJ SOLN
INTRAMUSCULAR | Status: AC
  Filled 2023-01-24: qty 2

## 2023-01-24 MED ORDER — FENTANYL CITRATE (PF) 100 MCG/2ML IJ SOLN
INTRAMUSCULAR | Status: DC | PRN
  Administered 2023-01-24: 25 ug via INTRAVENOUS

## 2023-01-24 MED ORDER — MIDAZOLAM HCL 5 MG/5ML IJ SOLN
INTRAMUSCULAR | Status: DC | PRN
  Administered 2023-01-24: 1 mg via INTRAVENOUS
  Administered 2023-01-24 (×2): .5 mg via INTRAVENOUS

## 2023-01-24 MED ORDER — CEFAZOLIN SODIUM-DEXTROSE 2-4 GM/100ML-% IV SOLN
INTRAVENOUS | Status: AC
  Filled 2023-01-24: qty 100

## 2023-01-24 MED ORDER — ACETAMINOPHEN 325 MG PO TABS: 325.0000 mg | ORAL_TABLET | ORAL | Status: DC | PRN

## 2023-01-24 MED ORDER — LIDOCAINE HCL 1 % IJ SOLN
INTRAMUSCULAR | Status: AC
  Filled 2023-01-24: qty 20

## 2023-01-24 MED ORDER — LIDOCAINE HCL (PF) 1 % IJ SOLN
INTRAMUSCULAR | Status: DC | PRN
  Administered 2023-01-24: 50 mL

## 2023-01-24 MED ORDER — POVIDONE-IODINE 10 % EX SWAB
2.0000 | Freq: Once | CUTANEOUS | Status: AC
  Administered 2023-01-24: 2 via TOPICAL

## 2023-01-24 MED ORDER — MIDAZOLAM HCL 5 MG/5ML IJ SOLN
INTRAMUSCULAR | Status: AC
  Filled 2023-01-24: qty 5

## 2023-01-24 MED ORDER — CHLORHEXIDINE GLUCONATE 4 % EX SOLN: 4.0000 | Freq: Once | CUTANEOUS | Status: DC

## 2023-01-24 MED ORDER — ONDANSETRON HCL 4 MG/2ML IJ SOLN: 4.0000 mg | Freq: Four times a day (QID) | INTRAMUSCULAR | Status: DC | PRN

## 2023-01-24 SURGICAL SUPPLY — 6 items
CABLE SURGICAL S-101-97-12 (CABLE) ×1 IMPLANT
ICD COBALT XT CRT DTPA2D4 (ICD Generator) IMPLANT
PAD DEFIB RADIO PHYSIO CONN (PAD) ×1 IMPLANT
POUCH AIGIS-R ANTIBACT ICD (Mesh General) ×1 IMPLANT
POUCH AIGIS-R ANTIBACT ICD LRG (Mesh General) IMPLANT
TRAY PACEMAKER INSERTION (PACKS) ×1 IMPLANT

## 2023-01-24 NOTE — H&P (Signed)
Electrophysiology Office Note   Date:  01/24/2023   ID:  Dyna, Dwight 1951-01-13, MRN [id number]  PCP:  Kirby Funk, MD (Inactive)  Cardiologist:  [facility] Primary Electrophysiologist:  Deveion Denz Jorja Loa, MD    Chief Complaint: CHF   History of Present Illness: Kristi Parker is a 72 y.o. female who is being seen today fo as a r the evaluation of CHF at the request of Regan Lemming, MD. Presenting today for electrophysiology evaluation.  He has a history significant chronic systolic heart failure due to nonischemic cardiomyopathy, VT, CVA in 2014, atrial fibrillation, CKD stage III-IV.  Heart failure is diagnosed in 2013.  She is status post Medtronic CRT-D.  Today, denies symptoms of palpitations, chest pain, shortness of breath, orthopnea, PND, lower extremity edema, claudication, dizziness, presyncope, syncope, bleeding, or neurologic sequela. The patient is tolerating medications without difficulties. Plan gen change today.     Past Medical History:  Diagnosis Date   Adenomatous colon polyp    Cervical radiculopathy at C5    CHF (congestive heart failure) (HCC)    Chronic systolic heart failure (HCC)    CKD (chronic kidney disease), stage III (HCC)    DDD (degenerative disc disease), cervical    DDD (degenerative disc disease), lumbar    Fibroid    GERD (gastroesophageal reflux disease)    Hearing loss 02/2010   L hearing loss/vertigo, steroids   History of seizure disorder    Related Hx   Mitral regurgitation    moderate   Multiple sclerosis (HCC) 1984   Nonischemic cardiomyopathy (HCC)    Moderate LVEF 35-40% by ECHO 2011, 25-30% by echo 2013   Permanent atrial fibrillation (HCC)    Pulmonary hypertension, secondary 06/04/2013   As a result of nonischemic cardiomyopathy EF 25-30%   Stroke [facility])    Right brain CVA, complete recovery 07/2003   Ventricular tachycardia (HCC)    Vertigo 02/2010   L hearing loss/vertigo, steroids   Past  Surgical History:  Procedure Laterality Date   BIV PACEMAKER GENERATOR CHANGE OUT N/A 09/18/2014   Procedure: BIV PACEMAKER GENERATOR CHANGE OUT;  Surgeon: Hillis Range, MD;  Location: [facility] CATH LAB;  Service: Cardiovascular;  Laterality: N/A;   DILATION AND CURETTAGE OF UTERUS     EP IMPLANTABLE DEVICE N/A 07/28/2016   Procedure: ICD Implant;  Surgeon: Marinus Maw, MD;  Location: [facility] INVASIVE CV LAB;  Service: Cardiovascular;  Laterality: N/A;   HYSTEROSCOPY     PACEMAKER INSERTION     PTVP 08/2001 for complete heart block. Upgrade PTVP to MDT BiV 06/2008 by Dr Amil Amen   RIGHT HEART CATH N/A 05/17/2018   Procedure: RIGHT HEART CATH;  Surgeon: Dolores Patty, MD;  Location: [facility] INVASIVE CV LAB;  Service: Cardiovascular;  Laterality: N/A;   RIGHT/LEFT HEART CATH AND CORONARY ANGIOGRAPHY N/A 02/02/2018   Procedure: RIGHT/LEFT HEART CATH AND CORONARY ANGIOGRAPHY;  Surgeon: Dolores Patty, MD;  Location: [facility] INVASIVE CV LAB;  Service: Cardiovascular;  Laterality: N/A;   TUBAL LIGATION       No current facility-administered medications for this encounter.    Allergies:   Patient has no known allergies.   Social History:  The patient  reports that she has never smoked. She has never used smokeless tobacco. She reports current alcohol use. She reports that she does not use drugs.   Family History:  The patient's family history includes CAD in an other family member; Cancer in her father; Diabetes in her  mother; Multiple sclerosis in her mother.   ROS:  Please see the history of present illness.   Otherwise, review of systems is positive for none.   All other systems are reviewed and negative.   PHYSICAL EXAM: VS:  LMP  (LMP Unknown)  , BMI There is no height or weight on file to calculate BMI. GEN: Well nourished, well developed, in no acute distress  HEENT: normal  Neck: no JVD, carotid bruits, or masses Cardiac: RRR; no murmurs, rubs, or gallops,no edema  Respiratory:  clear to  auscultation bilaterally, normal work of breathing GI: soft, nontender, nondistended, + BS MS: no deformity or atrophy  Skin: warm and dry Neuro:  Strength and sensation are intact Psych: euthymic mood, full affect   Recent Labs: 04/05/2022: ALT 24; B Natriuretic Peptide 221.3 07/13/2022: Magnesium 2.3 12/28/2022: BUN 34; Creatinine, Ser 2.18; Hemoglobin 11.1; Platelets 124; Potassium 5.1; Sodium 140    Lipid Panel     Component Value Date/Time   CHOL 181 02/20/2017 0955   TRIG 68 02/20/2017 0955   HDL 66 02/20/2017 0955   CHOLHDL 2.7 02/20/2017 0955   CHOLHDL 4 07/18/2013 1100   VLDL 24.4 07/18/2013 1100   LDLCALC 101 (H) 02/20/2017 0955     Wt Readings from Last 3 Encounters:  10/28/22 59 kg  07/26/22 57.2 kg  06/13/22 56 kg      Other studies Reviewed: Additional studies/ records that were reviewed today include: TTE 04/05/22  Review of the above records today demonstrates:   1. Left ventricular ejection fraction, by estimation, is 20 to 25%. The  left ventricle has severely decreased function. The left ventricle  demonstrates global hypokinesis. The left ventricular internal cavity size  was moderately dilated. Left  ventricular diastolic parameters are indeterminate.   2. Right ventricular systolic function is normal. The right ventricular  size is normal. There is mildly elevated pulmonary artery systolic  pressure.   3. Left atrial size was severely dilated.   4. Right atrial size was moderately dilated.   5. The mitral valve is normal in structure. Moderate mitral valve  regurgitation. No evidence of mitral stenosis.   6. The aortic valve is tricuspid. Aortic valve regurgitation is not  visualized. No aortic stenosis is present.   7. The inferior vena cava is dilated in size with >50% respiratory  variability, suggesting right atrial pressure of 8 mmHg.    ASSESSMENT AND PLAN:  1.  Chronic systolic heart failure:  ICD Criteria  Current LVEF:25%. Within  12 months prior to implant: Yes   Heart failure history: Yes, Class II  Cardiomyopathy history: Yes, Non-Ischemic Cardiomyopathy.  Atrial Fibrillation/Atrial Flutter: Yes, Paroxysmal.  Ventricular tachycardia history: Yes, Hemodynamic instability present. VT Type: Sustained Ventricular Tachycardia - Monomorphic.  Cardiac arrest history: No.  History of syndromes with risk of sudden death: No.  Previous ICD: Yes, Reason for ICD:  Primary prevention.  Current ICD indication: Primary  PPM indication: Yes. Pacing type: Ventricular. Greater than 40% RV pacing requirement anticipated. Indication: 2:1 AV Block  Class I or II Bradycardia indication present: No  Beta Blocker therapy for 3 or more months: Yes, prescribed.   Ace Inhibitor/ARB therapy for 3 or more months: Yes, prescribed.    I have seen Kristi Parker is a 72 y.o. femalepre-procedural and has been referred by Bensimhon for consideration of ICD implant for primary prevention of sudden death.  The patient's chart has been reviewed and they meet criteria for ICD implant.  I have  had a thorough discussion with the patient reviewing options.  The patient and their family (if available) have had opportunities to ask questions and have them answered. The patient and I have decided together through the [facility] Share Decision Support Tool to generator change ICD at this time.  Risks, benefits, alternatives to ICD implantation were discussed in detail with the patient today. The patient  understands that the risks include but are not limited to bleeding, infection, pneumothorax, perforation, tamponade, vascular damage, renal failure, MI, stroke, death, inappropriate shocks, and lead dislodgement and  wishes to proceed.

## 2023-01-24 NOTE — Discharge Instructions (Signed)

## 2023-01-25 ENCOUNTER — Encounter (HOSPITAL_COMMUNITY): Payer: Self-pay | Admitting: Cardiology

## 2023-01-25 MED FILL — Lidocaine HCl Local Inj 1%: INTRAMUSCULAR | Qty: 20 | Status: AC

## 2023-01-25 NOTE — Progress Notes (Signed)
EPIC Encounter for ICM Monitoring  Patient Name: Kristi Parker is a 72 y.o. female Date: 01/25/2023 Primary Care Physican: Kirby Funk, MD (Inactive) Electrophysiologist: Kathreen Cornfield Pacing:  99.4%    01/28/2022 Weight:  118 lbs 03/04/2022 Weight: 118-121 lbs 05/12/2022 Weight: 118.2 lbs 09/06/2022 Weight: 126 lbs 10/14/2022 Weight: 125 lbs           Attempted call to patient and unable to reach.  Left detailed message per DPR regarding transmission. Transmission reviewed.   Device battery replacement scheduled 6/11.   Optivol thoracic impedance suggesting normal fluid accumulation.    Prescribed:  Furosemide 40 mg take 1 tablet by mouth daily as needed. May take extra 1/2 tablet as needed for swelling. Spironolactone 25 mg take 0.5 tablet (12.5 mg total) daily   Labs: 12/28/2022 Creatinine 2.18, BUN 34, Potassium 5.1, Sodium 140, GFR 24 A complete set of results can be found in Results Review.   Recommendations:   No changes    Follow-up plan: ICM clinic phone appointment on 03/06/2023.  91 day device clinic remote transmission pending battery replacement.     EP/Cardiology Office Visits:   05/01/2023 with Dr Elberta Fortis.  Next HF clinic OV due 09/2022 (no recall but OV note says call in December for appt).     Copy of ICM check sent to Dr Elberta Fortis.    3 month ICM trend: 01/23/2023.    12-14 Month ICM trend:     Karie Soda, RN 01/25/2023 1:12 PM

## 2023-01-26 ENCOUNTER — Other Ambulatory Visit (HOSPITAL_COMMUNITY): Payer: Self-pay | Admitting: Cardiology

## 2023-01-26 MED ORDER — FUROSEMIDE 40 MG PO TABS: 40.0000 mg | ORAL_TABLET | Freq: Every day | ORAL | 3 refills | Status: DC | PRN

## 2023-02-02 ENCOUNTER — Ambulatory Visit: Payer: Medicare PPO | Attending: Cardiology

## 2023-02-02 DIAGNOSIS — I442 Atrioventricular block, complete: Secondary | ICD-10-CM

## 2023-02-02 LAB — CUP PACEART INCLINIC DEVICE CHECK
Date Time Interrogation Session: 20240620153223
Implantable Lead Connection Status: 753985
Implantable Lead Connection Status: 753985
Implantable Lead Connection Status: 753985
Implantable Lead Implant Date: 20091104
Implantable Lead Implant Date: 20091104
Implantable Lead Implant Date: 20171214
Implantable Lead Location: 753858
Implantable Lead Location: 753859
Implantable Lead Location: 753860
Implantable Lead Model: 4196
Implantable Lead Model: 5076
Implantable Pulse Generator Implant Date: 20240611

## 2023-02-02 NOTE — Progress Notes (Signed)
Wound check appointment. Steri-strips removed. Wound without redness or edema. Incision edges approximated, wound well healed. Normal device function. Thresholds, sensing, and impedances consistent with implant measurements BV pacing >99%. Histogram distribution appropriate for patient and level of activity. No mode switches or ventricular arrhythmias noted. Patient educated about wound care, and shock plan. ROV in 3 months with implanting physician.

## 2023-02-02 NOTE — Patient Instructions (Addendum)
   After Your ICD (Implantable Cardiac Defibrillator)    Monitor your defibrillator site for redness, swelling, and drainage. Call the device clinic at [phone] if you experience these symptoms or fever/chills.  Your incision was closed with Steri-strips or staples:  You may shower 7 days after your procedure and wash your incision with soap and water. Avoid lotions, ointments, or perfumes over your incision until it is well-healed.  You may use a hot tub or a pool after your wound check appointment if the incision is completely closed.   There are no other restrictions in arm movement after your wound check appointment.  Your ICD is designed to protect you from life threatening heart rhythms. Because of this, you may receive a shock.   1 shock with no symptoms:  Call the office during business hours. 1 shock with symptoms (chest pain, chest pressure, dizziness, lightheadedness, shortness of breath, overall feeling unwell):  Call 911. If you experience 2 or more shocks in 24 hours:  Call 911. If you receive a shock, you should not drive.  [place] DMV - no driving for 6 months if you receive appropriate therapy from your ICD.   ICD Alerts:  Some alerts are vibratory and others beep. These are NOT emergencies. Please call our office to let us know. If this occurs at night or on weekends, it can wait until the next business day. Send a remote transmission.  If your device is capable of reading fluid status (for heart failure), you will be offered monthly monitoring to review this with you.   Remote monitoring is used to monitor your ICD from home. This monitoring is scheduled every 91 days by our office. It allows us to keep an eye on the functioning of your device to ensure it is working properly. You will routinely see your Electrophysiologist annually (more often if necessary).  

## 2023-02-08 ENCOUNTER — Other Ambulatory Visit (HOSPITAL_COMMUNITY): Payer: Self-pay | Admitting: Internal Medicine

## 2023-02-28 ENCOUNTER — Other Ambulatory Visit: Payer: Self-pay | Admitting: Physician Assistant

## 2023-02-28 ENCOUNTER — Ambulatory Visit
Admission: RE | Admit: 2023-02-28 | Discharge: 2023-02-28 | Disposition: A | Discharge status: Home / Self Care | Payer: Medicare PPO | Source: Ambulatory Visit | Attending: Physician Assistant | Admitting: Physician Assistant

## 2023-02-28 DIAGNOSIS — M542 Cervicalgia: Secondary | ICD-10-CM

## 2023-02-28 DIAGNOSIS — M549 Dorsalgia, unspecified: Secondary | ICD-10-CM

## 2023-03-06 ENCOUNTER — Ambulatory Visit: Payer: Medicare PPO | Attending: Cardiology

## 2023-03-06 DIAGNOSIS — Z95810 Presence of automatic (implantable) cardiac defibrillator: Secondary | ICD-10-CM | POA: Diagnosis not present

## 2023-03-06 DIAGNOSIS — I5022 Chronic systolic (congestive) heart failure: Secondary | ICD-10-CM | POA: Diagnosis not present

## 2023-03-08 NOTE — Progress Notes (Signed)
EPIC Encounter for ICM Monitoring  Patient Name: Kristi Parker is a 72 y.o. female Date: 03/08/2023 Primary Care Physican: Kirby Funk, MD (Inactive) Electrophysiologist: Kathreen Cornfield Pacing:  99.8%    09/06/2022 Weight: 126 lbs 10/14/2022 Weight: 125 lbs           Spoke with patient and heart failure questions reviewed.  Transmission results reviewed.  Pt asymptomatic for fluid accumulation.  Reports feeling well at this time and voices no complaints.  Currently at [facility] on vacation.   Optivol thoracic impedance suggesting normal fluid accumulation.    Prescribed:  Furosemide 40 mg take 1 tablet by mouth daily as needed. May take extra 1/2 tablet as needed for swelling. Spironolactone 25 mg take 0.5 tablet (12.5 mg total) daily   Labs: 12/28/2022 Creatinine 2.18, BUN 34, Potassium 5.1, Sodium 140, GFR 24 A complete set of results can be found in Results Review.   Recommendations:  No changes and encouraged to call if experiencing any fluid symptoms.   Follow-up plan: ICM clinic phone appointment on 04/10/2023.  91 day device clinic remote transmission 04/26/2023.     EP/Cardiology Office Visits:   05/01/2023 with Dr Elberta Fortis.  Next HF clinic OV due 09/2022 (no recall but OV note says call in December for appt).     Copy of ICM check sent to Dr Elberta Fortis.    3 month ICM trend: 03/06/2023.    12-14 Month ICM trend:     Karie Soda, RN 03/08/2023 1:40 PM

## 2023-03-28 ENCOUNTER — Encounter: Payer: Medicare PPO | Admitting: Genetic Counselor

## 2023-04-10 ENCOUNTER — Ambulatory Visit: Payer: Medicare PPO

## 2023-04-10 DIAGNOSIS — Z95810 Presence of automatic (implantable) cardiac defibrillator: Secondary | ICD-10-CM | POA: Diagnosis not present

## 2023-04-10 DIAGNOSIS — I5022 Chronic systolic (congestive) heart failure: Secondary | ICD-10-CM | POA: Diagnosis not present

## 2023-04-11 ENCOUNTER — Encounter (HOSPITAL_COMMUNITY): Payer: Self-pay | Admitting: Internal Medicine

## 2023-04-15 ENCOUNTER — Other Ambulatory Visit (HOSPITAL_COMMUNITY): Payer: Self-pay | Admitting: Internal Medicine

## 2023-04-20 NOTE — Progress Notes (Signed)
EPIC Encounter for ICM Monitoring  Patient Name: Kristi Parker is a 72 y.o. female Date: 04/20/2023 Primary Care Physican: Kirby Funk, MD (Inactive) Electrophysiologist: Kathreen Cornfield Pacing:  99.8%    09/06/2022 Weight: 126 lbs 10/14/2022 Weight: 125 lbs           Transmission reviewed.    Optivol thoracic impedance suggesting normal fluid accumulation.    Prescribed:  Furosemide 40 mg take 1 tablet by mouth daily as needed. May take extra 1/2 tablet as needed for swelling. Spironolactone 25 mg take 0.5 tablet (12.5 mg total) daily   Labs: 12/28/2022 Creatinine 2.18, BUN 34, Potassium 5.1, Sodium 140, GFR 24 A complete set of results can be found in Results Review.   Recommendations:  No changes.   Follow-up plan: ICM clinic phone appointment on 05/22/2023.  91 day device clinic remote transmission 04/26/2023.     EP/Cardiology Office Visits:   05/01/2023 with Dr Elberta Fortis.  06/12/2023 with HF clinic     Copy of ICM check sent to Dr Elberta Fortis.   3 month ICM trend: 04/09/2023.    12-14 Month ICM trend:     Karie Soda, RN 04/20/2023 4:24 PM

## 2023-04-23 ENCOUNTER — Other Ambulatory Visit (HOSPITAL_COMMUNITY): Payer: Self-pay | Admitting: Family Medicine

## 2023-04-26 ENCOUNTER — Ambulatory Visit (INDEPENDENT_AMBULATORY_CARE_PROVIDER_SITE_OTHER): Payer: Medicare PPO

## 2023-04-26 DIAGNOSIS — I442 Atrioventricular block, complete: Secondary | ICD-10-CM | POA: Diagnosis not present

## 2023-04-26 DIAGNOSIS — I480 Paroxysmal atrial fibrillation: Secondary | ICD-10-CM

## 2023-04-26 LAB — CUP PACEART REMOTE DEVICE CHECK
Battery Remaining Longevity: 79 mo
Battery Voltage: 3.02 V
Brady Statistic RV Percent Paced: 99.86 %
Date Time Interrogation Session: 20240910223008
HighPow Impedance: 39 Ohm
Implantable Lead Connection Status: 753985
Implantable Lead Connection Status: 753985
Implantable Lead Connection Status: 753985
Implantable Lead Implant Date: 20091104
Implantable Lead Implant Date: 20091104
Implantable Lead Implant Date: 20171214
Implantable Lead Location: 753858
Implantable Lead Location: 753859
Implantable Lead Location: 753860
Implantable Lead Model: 4196
Implantable Lead Model: 5076
Implantable Pulse Generator Implant Date: 20240611
Lead Channel Impedance Value: 323 Ohm
Lead Channel Impedance Value: 361 Ohm
Lead Channel Impedance Value: 418 Ohm
Lead Channel Impedance Value: 437 Ohm
Lead Channel Impedance Value: 589 Ohm
Lead Channel Impedance Value: 988 Ohm
Lead Channel Pacing Threshold Amplitude: 0.5 V
Lead Channel Pacing Threshold Amplitude: 2.125 V
Lead Channel Pacing Threshold Pulse Width: 0.4 ms
Lead Channel Pacing Threshold Pulse Width: 0.8 ms
Lead Channel Sensing Intrinsic Amplitude: 9.6 mV
Lead Channel Setting Pacing Amplitude: 2 V
Lead Channel Setting Pacing Amplitude: 2 V
Lead Channel Setting Pacing Amplitude: 2.75 V
Lead Channel Setting Pacing Pulse Width: 0.4 ms
Lead Channel Setting Pacing Pulse Width: 0.8 ms
Lead Channel Setting Sensing Sensitivity: 0.3 mV
Zone Setting Status: 755011
Zone Setting Status: 755011

## 2023-05-01 ENCOUNTER — Ambulatory Visit: Payer: Medicare PPO | Attending: Cardiology | Admitting: Cardiology

## 2023-05-01 ENCOUNTER — Encounter: Payer: Self-pay | Admitting: Cardiology

## 2023-05-01 ENCOUNTER — Other Ambulatory Visit: Payer: Self-pay | Admitting: Physician Assistant

## 2023-05-01 VITALS — BP 114/80 | HR 68 | Ht 63.0 in | Wt 129.2 lb

## 2023-05-01 DIAGNOSIS — I472 Ventricular tachycardia, unspecified: Secondary | ICD-10-CM

## 2023-05-01 DIAGNOSIS — Z79899 Other long term (current) drug therapy: Secondary | ICD-10-CM

## 2023-05-01 DIAGNOSIS — Z95810 Presence of automatic (implantable) cardiac defibrillator: Secondary | ICD-10-CM | POA: Diagnosis not present

## 2023-05-01 DIAGNOSIS — I442 Atrioventricular block, complete: Secondary | ICD-10-CM

## 2023-05-01 DIAGNOSIS — I480 Paroxysmal atrial fibrillation: Secondary | ICD-10-CM

## 2023-05-01 DIAGNOSIS — I428 Other cardiomyopathies: Secondary | ICD-10-CM

## 2023-05-01 MED ORDER — AMIODARONE HCL 200 MG PO TABS
100.0000 mg | ORAL_TABLET | Freq: Every day | ORAL | 3 refills | Status: DC
Start: 2023-05-01 — End: 2023-07-27

## 2023-05-01 NOTE — Patient Instructions (Addendum)
  Medication Instructions:  Your physician has recommended you make the following change in your medication:  DECREASE Amiodarone to 100 mg once daily  *If you need a refill on your cardiac medications before your next appointment, please call your pharmacy*   Lab Work: Amiodarone surveillance labs today: TSH & LFTs If you have labs (blood work) drawn today and your tests are completely normal, you will receive your results only by: MyChart Message (if you have MyChart) OR A paper copy in the mail If you have any lab test that is abnormal or we need to change your treatment, we will call you to review the results.   Testing/Procedures: None ordered   Follow-Up: At [facility], you and your health needs are our priority.  As part of our continuing mission to provide you with exceptional heart care, we have created designated Provider Care Teams.  These Care Teams include your primary Cardiologist (physician) and Advanced Practice Providers (APPs -  Physician Assistants and Nurse Practitioners) who all work together to provide you with the care you need, when you need it.   Your next appointment:   6 month(s)  The format for your next appointment:   In Person  Provider:   You will see one of the following Advanced Practice Providers on your designated Care Team:   Francis Dowse, [place] Baldwin Crown" [place], [place] Canary Brim, NP  {  Thank you for choosing [facility]!!   Dory Horn, RN [phone]

## 2023-05-01 NOTE — Progress Notes (Signed)
Electrophysiology Office Note:   Date:  05/01/2023  ID:  Kristi Parker, DOB Feb 18, 1951, MRN [id number]  Primary Cardiologist: Donato Schultz, MD Electrophysiologist: Regan Lemming, MD      History of Present Illness:   Kristi Parker is a 72 y.o. female with h/o chronic systolic heart failure due to nonischemic cardiomyopathy, VT, CVA in 2014, atrial fibrillation, CKD stage III-IV seen today for routine electrophysiology followup.   Since last being seen in our clinic the patient reports doing overall well from a cardiac perspective.  She does not have chest pain or shortness of breath.  She is able to do her daily activities.  She is mostly limited by back pain.  She is found to have degenerative changes in her cervical and lumbar spine and is followed by spine surgery.  Currently there are no plans for surgery.  she denies chest pain, palpitations, dyspnea, PND, orthopnea, nausea, vomiting, dizziness, syncope, edema, weight gain, or early satiety.   Review of systems complete and found to be negative unless listed in HPI.      EP Information / Studies Reviewed:    EKG is ordered today. Personal review as below.  EKG Interpretation Date/Time:  Monday May 01 2023 14:32:33 EDT Ventricular Rate:  68 PR Interval:  166 QRS Duration:  190 QT Interval:  472 QTC Calculation: 501 R Axis:   156  Text Interpretation: AV dual-paced rhythm Biventricular pacemaker detected When compared with ECG of 01-May-2023 14:31, Vent. rate has decreased BY   6 BPM Confirmed by Ira Dougher ([id number]) on 05/01/2023 2:42:28 PM   ICD Interrogation-  reviewed in detail today,  See PACEART report.  Device History: Medtronic BiV ICD generator change 01/24/2023 for CHF History of appropriate therapy: Yes History of AAD therapy: Yes; currently on amiodarone    Risk Assessment/Calculations:    CHA2DS2-VASc Score = 5   This indicates a 7.2% annual risk of stroke. The patient's score is based  upon: CHF History: 1 HTN History: 0 Diabetes History: 0 Stroke History: 2 Vascular Disease History: 0 Age Score: 1 Gender Score: 1             Physical Exam:   VS:  BP 114/80 (BP Location: Left Arm, Patient Position: Sitting, Cuff Size: Normal)   Pulse 68   Ht 5\' 3"  (1.6 m)   Wt 129 lb 3.2 oz (58.6 kg)   LMP  (LMP Unknown)   SpO2 99%   BMI 22.89 kg/m    Wt Readings from Last 3 Encounters:  05/01/23 129 lb 3.2 oz (58.6 kg)  01/24/23 129 lb (58.5 kg)  10/28/22 130 lb (59 kg)     GEN: Well nourished, well developed in no acute distress NECK: No JVD; No carotid bruits CARDIAC: Regular rate and rhythm, no murmurs, rubs, gallops RESPIRATORY:  Clear to auscultation without rales, wheezing or rhonchi  ABDOMEN: Soft, non-tender, non-distended EXTREMITIES:  No edema; No deformity   ASSESSMENT AND PLAN:    Chronic systolic dysfunction s/p Medtronic CRT-D  euvolemic today Stable on an appropriate medical regimen Normal ICD function See Pace Art report No changes today  2.  Paroxysmal atrial fibrillation: Currently on Eliquis and amiodarone.  Minimal episodes noted  3.  Secondary hypercoagulable state: Currently on Eliquis for atrial fibrillation  4.  High risk medication monitoring: Currently on amiodarone.  Kristi Parker check TSH and LFTs today.  5.  Ventricular tachycardia: Currently on amiodarone.  Minimal episodes.  Kristi Parker reduce amiodarone to 100 mg daily  Disposition:   Follow up with EP APP in 6 months   Signed, Kristi Tiede Jorja Loa, MD

## 2023-05-02 LAB — HEPATIC FUNCTION PANEL
ALT: 20 IU/L (ref 0–32)
AST: 17 IU/L (ref 0–40)
Albumin: 4.6 g/dL (ref 3.8–4.8)
Alkaline Phosphatase: 159 IU/L — ABNORMAL HIGH (ref 44–121)
Bilirubin Total: 1.4 mg/dL — ABNORMAL HIGH (ref 0.0–1.2)
Bilirubin, Direct: 0.34 mg/dL (ref 0.00–0.40)
Total Protein: 7.5 g/dL (ref 6.0–8.5)

## 2023-05-02 LAB — TSH: TSH: 1.33 u[IU]/mL (ref 0.450–4.500)

## 2023-05-03 DIAGNOSIS — Z1231 Encounter for screening mammogram for malignant neoplasm of breast: Secondary | ICD-10-CM | POA: Diagnosis not present

## 2023-05-05 DIAGNOSIS — R262 Difficulty in walking, not elsewhere classified: Secondary | ICD-10-CM | POA: Diagnosis not present

## 2023-05-05 DIAGNOSIS — M5416 Radiculopathy, lumbar region: Secondary | ICD-10-CM | POA: Diagnosis not present

## 2023-05-07 ENCOUNTER — Other Ambulatory Visit (HOSPITAL_COMMUNITY): Payer: Self-pay | Admitting: Internal Medicine

## 2023-05-09 DIAGNOSIS — R262 Difficulty in walking, not elsewhere classified: Secondary | ICD-10-CM | POA: Diagnosis not present

## 2023-05-09 DIAGNOSIS — M5416 Radiculopathy, lumbar region: Secondary | ICD-10-CM | POA: Diagnosis not present

## 2023-05-12 NOTE — Progress Notes (Signed)
Remote ICD transmission.   

## 2023-05-16 DIAGNOSIS — M5416 Radiculopathy, lumbar region: Secondary | ICD-10-CM | POA: Diagnosis not present

## 2023-05-16 DIAGNOSIS — R262 Difficulty in walking, not elsewhere classified: Secondary | ICD-10-CM | POA: Diagnosis not present

## 2023-05-19 DIAGNOSIS — N184 Chronic kidney disease, stage 4 (severe): Secondary | ICD-10-CM | POA: Diagnosis not present

## 2023-05-19 DIAGNOSIS — D631 Anemia in chronic kidney disease: Secondary | ICD-10-CM | POA: Diagnosis not present

## 2023-05-19 DIAGNOSIS — N189 Chronic kidney disease, unspecified: Secondary | ICD-10-CM | POA: Diagnosis not present

## 2023-05-19 DIAGNOSIS — N2581 Secondary hyperparathyroidism of renal origin: Secondary | ICD-10-CM | POA: Diagnosis not present

## 2023-05-19 DIAGNOSIS — I129 Hypertensive chronic kidney disease with stage 1 through stage 4 chronic kidney disease, or unspecified chronic kidney disease: Secondary | ICD-10-CM | POA: Diagnosis not present

## 2023-05-22 ENCOUNTER — Ambulatory Visit (INDEPENDENT_AMBULATORY_CARE_PROVIDER_SITE_OTHER): Payer: Medicare PPO

## 2023-05-22 DIAGNOSIS — Z95810 Presence of automatic (implantable) cardiac defibrillator: Secondary | ICD-10-CM

## 2023-05-22 DIAGNOSIS — I5022 Chronic systolic (congestive) heart failure: Secondary | ICD-10-CM | POA: Diagnosis not present

## 2023-05-23 ENCOUNTER — Other Ambulatory Visit (HOSPITAL_COMMUNITY): Payer: Self-pay | Admitting: Internal Medicine

## 2023-05-23 DIAGNOSIS — M5416 Radiculopathy, lumbar region: Secondary | ICD-10-CM | POA: Diagnosis not present

## 2023-05-23 DIAGNOSIS — R262 Difficulty in walking, not elsewhere classified: Secondary | ICD-10-CM | POA: Diagnosis not present

## 2023-05-25 DIAGNOSIS — M5416 Radiculopathy, lumbar region: Secondary | ICD-10-CM | POA: Diagnosis not present

## 2023-05-25 DIAGNOSIS — R262 Difficulty in walking, not elsewhere classified: Secondary | ICD-10-CM | POA: Diagnosis not present

## 2023-05-30 DIAGNOSIS — R262 Difficulty in walking, not elsewhere classified: Secondary | ICD-10-CM | POA: Diagnosis not present

## 2023-05-30 DIAGNOSIS — M5416 Radiculopathy, lumbar region: Secondary | ICD-10-CM | POA: Diagnosis not present

## 2023-05-30 NOTE — Progress Notes (Signed)
EPIC Encounter for ICM Monitoring  Patient Name: Kristi Parker is a 72 y.o. female Date: 05/30/2023 Primary Care Physican: Emilio Aspen, MD Primary Cardiologist: [name] Electrophysiologist: [name] Bi-V Pacing:  99.7%    09/06/2022 Weight: 126 lbs 10/14/2022 Weight: 125 lbs           Transmission reviewed.    Optivol thoracic impedance suggesting normal fluid levels within the last month.    Prescribed:  Furosemide 40 mg take 1 tablet by mouth daily as needed. May take extra 1/2 tablet as needed for swelling. Spironolactone 25 mg take 0.5 tablet (12.5 mg total) daily   Labs: 12/28/2022 Creatinine 2.18, BUN 34, Potassium 5.1, Sodium 140, GFR 24 A complete set of results can be found in Results Review.   Recommendations:  No changes.   Follow-up plan: ICM clinic phone appointment on 06/26/2023.  91 day device clinic remote transmission 07/26/2023.     EP/Cardiology Office Visits:   6 month EP visit due 10/2023 (no recall).  06/12/2023 with HF clinic     Copy of ICM check sent to Dr Elberta Fortis.   3 month ICM trend: 05/21/2023.    12-14 Month ICM trend:     Karie Soda, RN 05/30/2023 3:36 PM

## 2023-06-01 DIAGNOSIS — M5416 Radiculopathy, lumbar region: Secondary | ICD-10-CM | POA: Diagnosis not present

## 2023-06-01 DIAGNOSIS — R262 Difficulty in walking, not elsewhere classified: Secondary | ICD-10-CM | POA: Diagnosis not present

## 2023-06-06 DIAGNOSIS — M5416 Radiculopathy, lumbar region: Secondary | ICD-10-CM | POA: Diagnosis not present

## 2023-06-06 DIAGNOSIS — R262 Difficulty in walking, not elsewhere classified: Secondary | ICD-10-CM | POA: Diagnosis not present

## 2023-06-08 DIAGNOSIS — M5416 Radiculopathy, lumbar region: Secondary | ICD-10-CM | POA: Diagnosis not present

## 2023-06-08 DIAGNOSIS — R262 Difficulty in walking, not elsewhere classified: Secondary | ICD-10-CM | POA: Diagnosis not present

## 2023-06-12 ENCOUNTER — Encounter (HOSPITAL_COMMUNITY): Payer: Medicare PPO | Admitting: Internal Medicine

## 2023-06-13 DIAGNOSIS — M5416 Radiculopathy, lumbar region: Secondary | ICD-10-CM | POA: Diagnosis not present

## 2023-06-13 DIAGNOSIS — R262 Difficulty in walking, not elsewhere classified: Secondary | ICD-10-CM | POA: Diagnosis not present

## 2023-06-14 ENCOUNTER — Ambulatory Visit (HOSPITAL_COMMUNITY)
Admission: RE | Admit: 2023-06-14 | Discharge: 2023-06-14 | Disposition: A | Discharge status: Home / Self Care | Payer: Medicare PPO | Source: Ambulatory Visit | Attending: Internal Medicine | Admitting: Internal Medicine

## 2023-06-14 ENCOUNTER — Encounter (HOSPITAL_COMMUNITY): Payer: Self-pay | Admitting: Internal Medicine

## 2023-06-14 VITALS — BP 118/82 | HR 81 | Wt 128.8 lb

## 2023-06-14 DIAGNOSIS — I480 Paroxysmal atrial fibrillation: Secondary | ICD-10-CM | POA: Diagnosis not present

## 2023-06-14 DIAGNOSIS — I472 Ventricular tachycardia, unspecified: Secondary | ICD-10-CM | POA: Diagnosis not present

## 2023-06-14 DIAGNOSIS — Z95810 Presence of automatic (implantable) cardiac defibrillator: Secondary | ICD-10-CM | POA: Diagnosis not present

## 2023-06-14 DIAGNOSIS — I5022 Chronic systolic (congestive) heart failure: Secondary | ICD-10-CM | POA: Insufficient documentation

## 2023-06-14 LAB — BASIC METABOLIC PANEL
Anion gap: 9 (ref 5–15)
BUN: 33 mg/dL — ABNORMAL HIGH (ref 8–23)
CO2: 26 mmol/L (ref 22–32)
Calcium: 9.6 mg/dL (ref 8.9–10.3)
Chloride: 103 mmol/L (ref 98–111)
Creatinine, Ser: 2.29 mg/dL — ABNORMAL HIGH (ref 0.44–1.00)
GFR, Estimated: 22 mL/min — ABNORMAL LOW (ref >60)
Glucose, Bld: 97 mg/dL (ref 70–99)
Potassium: 4.1 mmol/L (ref 3.5–5.1)
Sodium: 138 mmol/L (ref 135–145)

## 2023-06-14 LAB — BRAIN NATRIURETIC PEPTIDE: B Natriuretic Peptide: 211.2 pg/mL — ABNORMAL HIGH (ref 0.0–100.0)

## 2023-06-14 NOTE — Patient Instructions (Signed)
There has been no changes to your medications.  Labs done today, your results will be available in MyChart, we will contact you for abnormal readings.  Your physician has requested that you have an echocardiogram. Echocardiography is a painless test that uses sound waves to create images of your heart. It provides your doctor with information about the size and shape of your heart and how well your heart's chambers and valves are working. This procedure takes approximately one hour. There are no restrictions for this procedure. Please do NOT wear cologne, perfume, aftershave, or lotions (deodorant is allowed). Please arrive 15 minutes prior to your appointment time.  Your physician recommends that you schedule a follow-up appointment in: 4 months with an echocardiogram ( April 2025) ** PLEASE CALL THE OFFICE IN [place] TO ARRANGE YOUR FOLLOW UP APPOINTMENT. **  If you have any questions or concerns before your next appointment please send [place] a message through [place] or call our office at [phone].    TO LEAVE A MESSAGE FOR THE NURSE SELECT OPTION 2, PLEASE LEAVE A MESSAGE INCLUDING: YOUR NAME DATE OF BIRTH CALL BACK NUMBER REASON FOR CALL**this is important as we prioritize the call backs  YOU WILL RECEIVE A CALL BACK THE SAME DAY AS LONG AS YOU CALL BEFORE 4:00 PM  At [facility], you and your health needs are our priority. As part of our continuing mission to provide you with exceptional heart care, we have created designated Provider Care Teams. These Care Teams include your primary Cardiologist (physician) and Advanced Practice Providers (APPs- Physician Assistants and Nurse Practitioners) who all work together to provide you with the care you need, when you need it.   You may see any of the following providers on your designated Care Team at your next follow up: Dr Arvilla Meres Dr Marca Ancona Dr. Dorthula Nettles Dr. Clearnce Hasten Amy Filbert Schilder,  NP Robbie Lis, [place] [facility] [place], [place] Brynda Peon, NP [place] Lee, NP Karle Plumber, PharmD   Please be sure to bring in all your medications bottles to every appointment.    Thank you for choosing [facility]

## 2023-06-14 NOTE — Progress Notes (Signed)
Advance Heart Failure Clinic Note   PCP:  Emilio Aspen, MD  Cardiologist:  Donato Schultz, MD Primary HF: Aubreanna Percle  Chief Complaint: Heart Failure follow-up   History of Present Illness:  Kristi Parker is a 72 y.o. female with history of VT, severe systolic HF due to NICM (EF 20-25%) s/p Medtronic BiV ICD, CVA 2004, PAF, and CKD III-IV.    She was first diagnosed with HF in 2013. She is unsure of the cause. She had a heart cath and did not have any blockages. We also follow her son in HF clinic for NICM. She states that multiple family members have severe HF and several have underwent transplant.   Based on CPX testing, we referred her to Dr. Edwena Blow at [facility]. She was seen there on 02/22/18 and they discussed need for advanced therapies including possible heart or heart-kidney transplant. She had f/u with them subsequently and felt not to be candidate for heart-kidney transplant but might be candidate for high-risk VAD. We reviewed case with [facility] and she was also turned down for heart/kidney transplant due to age. She has decided she is not interested in VAD.    Had repeat CPX in 10/19 with low Vo2 (13.5) and high slope (42). Repeat RHC done on 10/19 as below as part of transplant work up at [facility] showed elevated volume with CI 2.7   Today she returns for HF follow up. Here with her husband. Struggling with back pain from DJD. Going to PT. Has been referred to Dr. Allena Katz at [facility] for CKD. Cleda Daub stopped due to hyperkalemia. Going to gym. 3x/week. Rides the bike 40 minutes. No CP or undue SOB. No change in functional capacity. No edema, orthopnea or PND. Tolerating meds well.    Echo 04/05/22 EF 20-25% Moderate MR Personally reviewed  Cardiac Studies: Echo (9/21): EF 25-30%, severe LV dysfunction, RV ok. Echo (8/20): EF 25% RV normal (Personally reviewed). CPX (10/19): low Vo2 (13.5), high slope (42).  RHC (10/19):  Findings:  RA = 12 RV = 53/12 PA = 51/22  (34) PCW = mean 28 (v = 35-40) Fick cardiac output/index = 4.4/2.7 PVR = 1.4 WU FA sat = 98% PA sat = 62%, 63% PAPi = 2.4 RA/PCWP = 0.42  Assessment: 1. NICM with elevated filling pressures and relatively preserved output. 2. Prominent v-waves in PCWP tracing suggestive of significant MR Plan/Discussion: Increase diuretics. Continue transplant w/u based on CPX results.   Past Medical History:  Diagnosis Date   Adenomatous colon polyp    Cervical radiculopathy at C5    CHF (congestive heart failure) (HCC)    Chronic systolic heart failure (HCC)    CKD (chronic kidney disease), stage III (HCC)    DDD (degenerative disc disease), cervical    DDD (degenerative disc disease), lumbar    Fibroid    GERD (gastroesophageal reflux disease)    Hearing loss 02/2010   L hearing loss/vertigo, steroids   History of seizure disorder    Related Hx   Mitral regurgitation    moderate   Multiple sclerosis (HCC) 1984   Nonischemic cardiomyopathy (HCC)    Moderate LVEF 35-40% by ECHO 2011, 25-30% by echo 2013   Permanent atrial fibrillation (HCC)    Pulmonary hypertension, secondary 06/04/2013   As a result of nonischemic cardiomyopathy EF 25-30%   Stroke [facility])    Right brain CVA, complete recovery 07/2003   Ventricular tachycardia (HCC)    Vertigo 02/2010   L hearing  loss/vertigo, steroids   Past Surgical History:  Procedure Laterality Date   BIV ICD GENERATOR CHANGEOUT N/A 01/24/2023   Procedure: BIV ICD GENERATOR CHANGEOUT;  Surgeon: Regan Lemming, MD;  Location: [facility] INVASIVE CV LAB;  Service: Cardiovascular;  Laterality: N/A;   BIV PACEMAKER GENERATOR CHANGE OUT N/A 09/18/2014   Procedure: BIV PACEMAKER GENERATOR CHANGE OUT;  Surgeon: Hillis Range, MD;  Location: [facility] CATH LAB;  Service: Cardiovascular;  Laterality: N/A;   DILATION AND CURETTAGE OF UTERUS     EP IMPLANTABLE DEVICE N/A 07/28/2016   Procedure: ICD Implant;  Surgeon: Marinus Maw, MD;  Location: [facility] INVASIVE CV LAB;   Service: Cardiovascular;  Laterality: N/A;   HYSTEROSCOPY     PACEMAKER INSERTION     PTVP 08/2001 for complete heart block. Upgrade PTVP to MDT BiV 06/2008 by Dr Amil Amen   RIGHT HEART CATH N/A 05/17/2018   Procedure: RIGHT HEART CATH;  Surgeon: Dolores Patty, MD;  Location: [facility] INVASIVE CV LAB;  Service: Cardiovascular;  Laterality: N/A;   RIGHT/LEFT HEART CATH AND CORONARY ANGIOGRAPHY N/A 02/02/2018   Procedure: RIGHT/LEFT HEART CATH AND CORONARY ANGIOGRAPHY;  Surgeon: Dolores Patty, MD;  Location: [facility] INVASIVE CV LAB;  Service: Cardiovascular;  Laterality: N/A;   TUBAL LIGATION      Current Outpatient Medications  Medication Sig Dispense Refill   acetaminophen (TYLENOL) 500 MG tablet Take 500-1,000 mg by mouth every 6 (six) hours as needed (for back pain).      ALPHAGAN P 0.1 % SOLN Place 1 drop into both eyes 2 (two) times daily.      amiodarone (PACERONE) 200 MG tablet Take 0.5 tablets (100 mg total) by mouth daily. 45 tablet 3   apixaban (ELIQUIS) 2.5 MG TABS tablet Take 1 tablet (2.5 mg total) by mouth 2 (two) times daily. NEEDS FOLLOW UP APPOINTMENT FOR MORE REFILLS 180 tablet 0   Ascorbic Acid (VITAMIN C) 500 MG CHEW Chew 500 mg by mouth daily.     carvedilol (COREG) 3.125 MG tablet Take 1 tablet (3.125 mg total) by mouth 2 (two) times daily with a meal. NEEDS FOLLOW UP APPOINTMENT FOR MORE REFILLS 180 tablet 0   dapagliflozin propanediol (FARXIGA) 10 MG TABS tablet Take 1 tablet (10 mg total) by mouth daily. 90 tablet 3   ENTRESTO 49-51 MG TAKE 1 TABLET BY MOUTH TWICE A DAY 180 tablet 3   fexofenadine (ALLEGRA) 180 MG tablet Take 180 mg by mouth daily as needed for allergies.      furosemide (LASIX) 40 MG tablet TAKE 1 TABLET (40 MG TOTAL) BY MOUTH DAILY AS NEEDED. AS NEEDED FOR SWELLING. 90 tablet 0   levothyroxine (SYNTHROID, LEVOTHROID) 50 MCG tablet Take 50 mcg by mouth daily before breakfast.      loratadine (CLARITIN) 10 MG tablet Take 10 mg by mouth daily as needed for  allergies.     No current facility-administered medications for this encounter.    Allergies:   Patient has no known allergies.   Social History:  The patient  reports that she has never smoked. She has never used smokeless tobacco. She reports current alcohol use. She reports that she does not use drugs.   Family History:  The patient's family history includes CAD in an other family member; Cancer in her father; Diabetes in her mother; Multiple sclerosis in her mother.   ROS:  Please see the history of present illness.   All other systems are personally reviewed and negative.   Vitals:  06/14/23 0949  BP: 118/82  Pulse: 81  SpO2: 100%  Weight: 58.4 kg (128 lb 12.8 oz)   Wt Readings from Last 3 Encounters:  06/14/23 58.4 kg (128 lb 12.8 oz)  05/01/23 58.6 kg (129 lb 3.2 oz)  01/24/23 58.5 kg (129 lb)   Exam:  General:  Elderly. Well appearing. No resp difficulty  HEENT: normal Neck: supple. no JVD. Carotids 2+ bilat; no bruits. No lymphadenopathy or thryomegaly appreciated. Cor: PMI nondisplaced. Regular rate & rhythm. No rubs, gallops or murmurs. Lungs: clear Abdomen: soft, nontender, nondistended. No hepatosplenomegaly. No bruits or masses. Good bowel sounds. Extremities: no cyanosis, clubbing, rash, edema Neuro: alert & orientedx3, cranial nerves grossly intact. moves all 4 extremities w/o difficulty. Affect pleasant  Recent Labs: 07/13/2022: Magnesium 2.3 12/28/2022: BUN 34; Creatinine, Ser 2.18; Hemoglobin 11.1; Platelets 124; Potassium 5.1; Sodium 140 05/01/2023: ALT 20; TSH 1.330  Personally reviewed    ICD interrogation: No VT/AF Optivol down.(Was up after spiro stopped but now improved) Activity 1.3 hr/day Personally reviewed   ASSESSMENT AND PLAN:  1. Chronic Systolic Heart Failure due to NICM. S/p Medtronic BiV ICD. Likely familial CM.  - Echo (9/21): EF 25-30%, severe LV dysfunction, RV ok. - Echo (8/20): EF 25% RV normal Personally reviewed - Echo  (12/14/17): EF 20-25%, trivial AI, mild to mod MR, mildly reduced RV, severely dilated RA and LA, PA pressures moderately to severely increased.  - CPX (05/16/18): pVO2 12.1, slope 48 - RHC (05/17/18): NICM with elevated filling pressures and relatively preserved output, suggestive of significant MR - LHC (02/02/18) with normal coronaries. RHC showed severe NICM with well compensated hemodynamics. - CPX (12/27/17): Peak VO2 13.5, slope 42 - Echo 9/21; EF 25-30% - Echo  04/05/22 EF 20-25%  - Stable NYHA II-III - Continue carvedilol 3.125 mg bid. - Continue Entresto 49/51 mg bid. - Continue Farxiga 10 mg daily. - Off spiro due to CKD and hyperkalemia - Blood type AB+. She has been seen by Dr. Edwena Blow at [facility] and felt not to be candidate for heart-kidney transplant, but might be candidate for high-risk VAD. [facility] declined seeing her for heart-kidney transplant due to age.   - Last discussed VAD on 6/21 (versus repeating CPX) but says she appears stable and wants to hold off for now. No change - Her son has been diagnosed LMNA cardiomyopathy and she wants to get genetic tested as well. Has been referred to Dr. Jomarie Longs - Previously discussed Barostim device but she was not interested.  - Labs today   2. PAF - Remains in NSR  - CHA2DS2VASC of at least 5. - Continue amiodarone 100 mg daily. - Continue Eliquis 2.5 bid - No bleeding    3. CKD IV - Baseline SCr ~ 1.7-1.9. - Labs today - Continue Farxiga - Follows with [facility]  4. MR - Mild on most recent echo (9/21). No change.   5. Hx of VT s/p Medtronic BiV ICD - ICD interrogated personally today. No VT - Continue amiodarone 100 mg day.    Arvilla Meres, MD  06/14/2023 10:26 AM  Advanced Heart Failure Clinic [facility] [address] Heart [facility] Vascular [place] [address] [phone] (office) [phone] (fax)

## 2023-06-15 ENCOUNTER — Telehealth (HOSPITAL_COMMUNITY): Payer: Self-pay | Admitting: *Deleted

## 2023-06-15 DIAGNOSIS — M5416 Radiculopathy, lumbar region: Secondary | ICD-10-CM | POA: Diagnosis not present

## 2023-06-15 DIAGNOSIS — R262 Difficulty in walking, not elsewhere classified: Secondary | ICD-10-CM | POA: Diagnosis not present

## 2023-06-15 NOTE — Telephone Encounter (Signed)
Called patient per Dr. Gala Romney with following lab results:  "Scr stable"  Pt verbalized understanding of same.

## 2023-06-19 ENCOUNTER — Other Ambulatory Visit: Payer: Self-pay | Admitting: Internal Medicine

## 2023-06-20 DIAGNOSIS — M5416 Radiculopathy, lumbar region: Secondary | ICD-10-CM | POA: Diagnosis not present

## 2023-06-20 DIAGNOSIS — R262 Difficulty in walking, not elsewhere classified: Secondary | ICD-10-CM | POA: Diagnosis not present

## 2023-06-23 ENCOUNTER — Encounter: Payer: Self-pay | Admitting: Internal Medicine

## 2023-06-26 ENCOUNTER — Ambulatory Visit: Payer: Medicare PPO | Attending: Cardiology

## 2023-06-26 DIAGNOSIS — H401112 Primary open-angle glaucoma, right eye, moderate stage: Secondary | ICD-10-CM | POA: Diagnosis not present

## 2023-06-26 DIAGNOSIS — I5022 Chronic systolic (congestive) heart failure: Secondary | ICD-10-CM

## 2023-06-26 DIAGNOSIS — H401123 Primary open-angle glaucoma, left eye, severe stage: Secondary | ICD-10-CM | POA: Diagnosis not present

## 2023-06-26 DIAGNOSIS — Z95810 Presence of automatic (implantable) cardiac defibrillator: Secondary | ICD-10-CM

## 2023-06-29 ENCOUNTER — Other Ambulatory Visit: Payer: Self-pay | Admitting: Internal Medicine

## 2023-07-03 NOTE — Progress Notes (Signed)
EPIC Encounter for ICM Monitoring  Patient Name: Kristi Parker is a 72 y.o. female Date: 07/03/2023 Primary Care Physican: Emilio Aspen, MD Primary Cardiologist: [name] Electrophysiologist: [name] Bi-V Pacing:  99.6%    09/06/2022 Weight: 126 lbs 10/14/2022 Weight: 125 lbs           Transmission reviewed.    Optivol thoracic impedance suggesting normal fluid levels within the last month.    Prescribed:  Furosemide 40 mg take 1 tablet by mouth daily as needed. May take extra 1/2 tablet as needed for swelling. Spironolactone 25 mg take 0.5 tablet (12.5 mg total) daily   Labs: 12/28/2022 Creatinine 2.18, BUN 34, Potassium 5.1, Sodium 140, GFR 24 A complete set of results can be found in Results Review.   Recommendations:  No changes.   Follow-up plan: ICM clinic phone appointment on 08/07/2023.  91 day device clinic remote transmission 07/26/2023.     EP/Cardiology Office Visits:   6 month EP visit due 10/2023 (no recall).  Recall 10/12/2023 with Dr Gala Romney     Copy of ICM check sent to Dr Elberta Fortis.   3 month ICM trend: 06/25/2023.    12-14 Month ICM trend:     Karie Soda, RN 07/03/2023 4:47 PM

## 2023-07-04 DIAGNOSIS — M5416 Radiculopathy, lumbar region: Secondary | ICD-10-CM | POA: Diagnosis not present

## 2023-07-04 DIAGNOSIS — R262 Difficulty in walking, not elsewhere classified: Secondary | ICD-10-CM | POA: Diagnosis not present

## 2023-07-06 DIAGNOSIS — R262 Difficulty in walking, not elsewhere classified: Secondary | ICD-10-CM | POA: Diagnosis not present

## 2023-07-06 DIAGNOSIS — M5416 Radiculopathy, lumbar region: Secondary | ICD-10-CM | POA: Diagnosis not present

## 2023-07-11 DIAGNOSIS — M5416 Radiculopathy, lumbar region: Secondary | ICD-10-CM | POA: Diagnosis not present

## 2023-07-11 DIAGNOSIS — R262 Difficulty in walking, not elsewhere classified: Secondary | ICD-10-CM | POA: Diagnosis not present

## 2023-07-20 DIAGNOSIS — N184 Chronic kidney disease, stage 4 (severe): Secondary | ICD-10-CM | POA: Diagnosis not present

## 2023-07-22 LAB — LAB REPORT - SCANNED: EGFR: 26

## 2023-07-26 ENCOUNTER — Ambulatory Visit (INDEPENDENT_AMBULATORY_CARE_PROVIDER_SITE_OTHER): Payer: Medicare PPO

## 2023-07-26 DIAGNOSIS — N2581 Secondary hyperparathyroidism of renal origin: Secondary | ICD-10-CM | POA: Diagnosis not present

## 2023-07-26 DIAGNOSIS — I129 Hypertensive chronic kidney disease with stage 1 through stage 4 chronic kidney disease, or unspecified chronic kidney disease: Secondary | ICD-10-CM | POA: Diagnosis not present

## 2023-07-26 DIAGNOSIS — I428 Other cardiomyopathies: Secondary | ICD-10-CM

## 2023-07-26 DIAGNOSIS — N184 Chronic kidney disease, stage 4 (severe): Secondary | ICD-10-CM | POA: Diagnosis not present

## 2023-07-26 DIAGNOSIS — D631 Anemia in chronic kidney disease: Secondary | ICD-10-CM | POA: Diagnosis not present

## 2023-07-26 LAB — CUP PACEART REMOTE DEVICE CHECK
Battery Remaining Longevity: 78 mo
Battery Voltage: 2.98 V
Brady Statistic RV Percent Paced: 99.24 %
Date Time Interrogation Session: 20241210200930
HighPow Impedance: 39 Ohm
Implantable Lead Connection Status: 753985
Implantable Lead Connection Status: 753985
Implantable Lead Connection Status: 753985
Implantable Lead Implant Date: 20091104
Implantable Lead Implant Date: 20091104
Implantable Lead Implant Date: 20171214
Implantable Lead Location: 753858
Implantable Lead Location: 753859
Implantable Lead Location: 753860
Implantable Lead Model: 4196
Implantable Lead Model: 5076
Implantable Pulse Generator Implant Date: 20240611
Lead Channel Impedance Value: 323 Ohm
Lead Channel Impedance Value: 323 Ohm
Lead Channel Impedance Value: 380 Ohm
Lead Channel Impedance Value: 418 Ohm
Lead Channel Impedance Value: 608 Ohm
Lead Channel Impedance Value: 988 Ohm
Lead Channel Pacing Threshold Amplitude: 0.5 V
Lead Channel Pacing Threshold Amplitude: 1.75 V
Lead Channel Pacing Threshold Pulse Width: 0.4 ms
Lead Channel Pacing Threshold Pulse Width: 1 ms
Lead Channel Sensing Intrinsic Amplitude: 5.8 mV
Lead Channel Setting Pacing Amplitude: 2 V
Lead Channel Setting Pacing Amplitude: 2 V
Lead Channel Setting Pacing Amplitude: 2.5 V
Lead Channel Setting Pacing Pulse Width: 0.4 ms
Lead Channel Setting Pacing Pulse Width: 1 ms
Lead Channel Setting Sensing Sensitivity: 0.3 mV
Zone Setting Status: 755011
Zone Setting Status: 755011

## 2023-07-27 ENCOUNTER — Telehealth: Payer: Self-pay

## 2023-07-27 MED ORDER — AMIODARONE HCL 200 MG PO TABS: 200.0000 mg | ORAL_TABLET | Freq: Every day | ORAL | 3 refills | Status: DC

## 2023-07-27 NOTE — Telephone Encounter (Signed)
Alert received from CV solutions:  Scheduled remote reviewed. Normal device function. 1 VT treated with ATP x 2, successful. 14 VHR detections, several of these last > 20 beats and some beats below detection interval. Several ventricular sensing episodes classified as VT up to 26 seconds in duration. Within the monitoring period, HF diagnostics have been normal. Routing to triage for further review of ventricular arrhythmias > 20 beats.  Full transmission in Epic for review.  Outreach made to Pt.  She denies any chest pain, shortness of breath, dizziness or palpitations.  She states she has felt more tired recently, but attributes this to problems with her back.  She is taking amiodarone 100 mg daily.  Advised would forward to Dr. Elberta Fortis for review and call back with any further advisement.

## 2023-07-27 NOTE — Telephone Encounter (Signed)
Discussed with Dr. Elberta Fortis.  Will increase amiodarone 200 mg-take one full tablet by mouth daily.  Dr. Elberta Fortis would like BMP and magnesium.  Per Pt she had recent lab work with Dr. Allena Katz with [facility].  Will request lab results.

## 2023-07-27 NOTE — Telephone Encounter (Signed)
Received lab results from [facility].  Potassium 4.2.  Magnesium 2.3.  Will scan to Pt's chart for Dr. Elberta Fortis to review.

## 2023-07-28 ENCOUNTER — Other Ambulatory Visit: Payer: Self-pay | Admitting: Internal Medicine

## 2023-08-07 ENCOUNTER — Ambulatory Visit: Payer: Medicare PPO | Attending: Cardiology

## 2023-08-07 DIAGNOSIS — I5022 Chronic systolic (congestive) heart failure: Secondary | ICD-10-CM

## 2023-08-07 DIAGNOSIS — Z95810 Presence of automatic (implantable) cardiac defibrillator: Secondary | ICD-10-CM | POA: Diagnosis not present

## 2023-08-11 NOTE — Progress Notes (Signed)
EPIC Encounter for ICM Monitoring  Patient Name: Kristi Parker is a 72 y.o. female Date: 08/11/2023 Primary Care Physican: Emilio Aspen, MD Primary Cardiologist: [name] Electrophysiologist: [name] Bi-V Pacing:  99.7%    09/06/2022 Weight: 126 lbs 10/14/2022 Weight: 125 lbs 08/10/2024 Weight: 125-126 lbs            Spoke with patient and heart failure questions reviewed.  Transmission results reviewed.  Pt reports her ankles are swollen today and she took extra Lasix.     Optivol thoracic impedance suggesting normal fluid levels within the last month.    Prescribed:  Furosemide 40 mg take 1 tablet by mouth daily as needed. May take extra 1/2 tablet as needed for swelling. Spironolactone 25 mg take 0.5 tablet (12.5 mg total) daily   Labs: 07/20/2023 Creatinine 2.03, BUN 23, Potassium 4.2, Sodium 141, GFR 26, Mg 2.3 06/14/2023 Creatinine 2.29, BUN 33, Potassium 4.1, Sodium 138, GFR 22 12/28/2022 Creatinine 2.18, BUN 34, Potassium 5.1, Sodium 140, GFR 24 A complete set of results can be found in Results Review.   Recommendations:  No changes and encouraged to call if experiencing any fluid symptoms.   Follow-up plan: ICM clinic phone appointment on 09/11/2023.  91 day device clinic remote transmission 10/25/2023.    EP/Cardiology Office Visits:   6 month EP visit due 10/2023 (no recall).  Recall 10/12/2023 with Dr Gala Romney     Copy of ICM check sent to Dr Elberta Fortis.   3 month ICM trend: 08/07/2023.    12-14 Month ICM trend:     Karie Soda, RN 08/11/2023 3:07 PM

## 2023-08-15 ENCOUNTER — Encounter: Payer: Medicare PPO | Admitting: Student

## 2023-08-17 DIAGNOSIS — I517 Cardiomegaly: Secondary | ICD-10-CM | POA: Diagnosis not present

## 2023-08-17 DIAGNOSIS — Z95810 Presence of automatic (implantable) cardiac defibrillator: Secondary | ICD-10-CM | POA: Diagnosis not present

## 2023-08-17 DIAGNOSIS — R059 Cough, unspecified: Secondary | ICD-10-CM | POA: Diagnosis not present

## 2023-08-17 DIAGNOSIS — I5022 Chronic systolic (congestive) heart failure: Secondary | ICD-10-CM | POA: Diagnosis not present

## 2023-08-17 DIAGNOSIS — I509 Heart failure, unspecified: Secondary | ICD-10-CM | POA: Diagnosis not present

## 2023-08-20 ENCOUNTER — Other Ambulatory Visit (HOSPITAL_COMMUNITY): Payer: Self-pay | Admitting: Internal Medicine

## 2023-08-25 ENCOUNTER — Telehealth: Payer: Self-pay

## 2023-08-25 DIAGNOSIS — I472 Ventricular tachycardia, unspecified: Secondary | ICD-10-CM

## 2023-08-25 MED ORDER — AMIODARONE HCL 200 MG PO TABS: 200.0000 mg | ORAL_TABLET | Freq: Two times a day (BID) | ORAL | 3 refills | Status: DC

## 2023-08-25 NOTE — Telephone Encounter (Signed)
 PDF exported to [facility] and routed to [facility] as FYI

## 2023-08-25 NOTE — Telephone Encounter (Signed)
 Pt received therapy from device. ATP x18. Shock x1 that's not visible on EGMs. Amio increased to BID. BMET and K ordered.

## 2023-08-25 NOTE — Telephone Encounter (Addendum)
 Was frustrated at time of event coorelating w/ alert. Felt her heart racing and unwell. At this time she went into the bedroom and laid down. Reports no syncope but I'm suspicious as to this. Overall feels fatigued. No other accompanying symptoms.

## 2023-08-25 NOTE — Telephone Encounter (Signed)
 Device alert for sustained VT with therapy Event occurred 1/9 @ 20:59, duration 5sec, mean HR 136, max 250.  ATP delivered x18, 40J HV therapy unsuccessful, EGM apparently suspended during shock.  All therapies failed per device, EGM VT accelerating to VF Route to triage high alert Shock alert was reset on [facility]. LA, CVRS

## 2023-08-26 LAB — BASIC METABOLIC PANEL
BUN/Creatinine Ratio: 15 (ref 12–28)
BUN: 36 mg/dL — ABNORMAL HIGH (ref 8–27)
CO2: 21 mmol/L (ref 20–29)
Calcium: 9.6 mg/dL (ref 8.7–10.3)
Chloride: 104 mmol/L (ref 96–106)
Creatinine, Ser: 2.43 mg/dL — ABNORMAL HIGH (ref 0.57–1.00)
Glucose: 98 mg/dL (ref 70–99)
Potassium: 4.3 mmol/L (ref 3.5–5.2)
Sodium: 143 mmol/L (ref 134–144)
eGFR: 21 mL/min/{1.73_m2} — ABNORMAL LOW (ref >59)

## 2023-08-26 LAB — MAGNESIUM: Magnesium: 2.5 mg/dL — ABNORMAL HIGH (ref 1.6–2.3)

## 2023-08-28 ENCOUNTER — Encounter: Payer: Self-pay | Admitting: Cardiology

## 2023-08-29 NOTE — Telephone Encounter (Signed)
 Spoke to patient.  Aware her labs were stable.  Advised Dr. Elberta Fortis recommends she see EP APP.  Aware scheduler will call her to arrange appt.

## 2023-08-29 NOTE — Telephone Encounter (Signed)
 Transmission received. No device classifed NSVT VT episodes; however, 7 new V sensing episodes that appear to be more V's than A's at a slow rate (~120 w/ intervals ~500-600 range). Unable to confirm d/t it being marker only EGMs.

## 2023-08-29 NOTE — Telephone Encounter (Signed)
 [name]

## 2023-08-29 NOTE — Telephone Encounter (Signed)
 Pt aware of result findings, via telephone call today;

## 2023-08-29 NOTE — Telephone Encounter (Signed)
 Spoke w/ pt. Still fatigued but no other symptoms currently. Labs resulted K 4.3 and Mg 2.5. Has not happened since. Manual transmission requested. Will review and route back to WC.

## 2023-09-01 NOTE — Progress Notes (Signed)
Remote ICD transmission.   

## 2023-09-11 ENCOUNTER — Ambulatory Visit: Payer: Medicare PPO | Attending: Cardiology

## 2023-09-11 DIAGNOSIS — Z95810 Presence of automatic (implantable) cardiac defibrillator: Secondary | ICD-10-CM

## 2023-09-11 DIAGNOSIS — I5022 Chronic systolic (congestive) heart failure: Secondary | ICD-10-CM | POA: Diagnosis not present

## 2023-09-15 NOTE — Progress Notes (Signed)
EPIC Encounter for ICM Monitoring  Patient Name: Kristi Parker is a 73 y.o. female Date: 09/15/2023 Primary Care Physican: Emilio Aspen, MD Primary Cardiologist: [name] Electrophysiologist: [name] Bi-V Pacing:  99.6%    09/06/2022 Weight: 126 lbs 10/14/2022 Weight: 125 lbs 08/10/2024 Weight: 125-126 lbs  11/31/2025 Weight: 126-127 with fluctuation of 1-2 lbs           Spoke with patient and heart failure questions reviewed.  Transmission results reviewed.  Pt reports feet were swollen during decreased impedance 1/9 and she took extra Lasix.     Optivol thoracic impedance suggesting normal fluid levels within the last month with the exception of possible fluid accumulation from 1/9-1/15.   1 shock on 08/24/2023 and has follow up OV with EPP App on 2/3.   Prescribed:  Furosemide 40 mg take 1 tablet by mouth daily as needed. May take extra 1/2 tablet as needed for swelling. Spironolactone 25 mg take 0.5 tablet (12.5 mg total) daily   Labs: 08/25/2023 Creatinine 2.43, BUN 36, Potassium 4.3, Sodium 143, GFR 15 07/20/2023 Creatinine 2.03, BUN 23, Potassium 4.2, Sodium 141, GFR 26, Mg 2.3 06/14/2023 Creatinine 2.29, BUN 33, Potassium 4.1, Sodium 138, GFR 22 12/28/2022 Creatinine 2.18, BUN 34, Potassium 5.1, Sodium 140, GFR 24 A complete set of results can be found in Results Review.   Recommendations:  No changes and encouraged to call if experiencing any fluid symptoms.   Follow-up plan: ICM clinic phone appointment on 10/16/2023.  91 day device clinic remote transmission 10/25/2023.    EP/Cardiology Office Visits:   09/18/2023 with Canary Brim, NP.  Recall 10/12/2023 with Dr Gala Romney     Copy of ICM check sent to Dr Elberta Fortis.   3 month ICM trend: 09/11/2023.    12-14 Month ICM trend:     Karie Soda, RN 09/15/2023 3:50 PM

## 2023-09-17 NOTE — Progress Notes (Unsigned)
Electrophysiology Office Note:   Date:  09/18/2023  ID:  Kristi Parker, DOB 01-10-1951, MRN [id number]  Primary Cardiologist: Donato Schultz, MD Primary Heart Failure: None Electrophysiologist: Will Jorja Loa, MD       History of Present Illness:   Kristi Parker is a 73 y.o. female with h/o CHB, NICM s/p ICD, VT, AF/AFL, Multiple Sclerosis & CKD III-IV seen today for routine electrophysiology followup.   In 07/2023 the patient was noted on monitoring to have possible fluid accumulation. On 08/25/23, the device clinic was alerted of sustained VT on 08/24/23 that received therapy.  She had ATPx18, 40j HV therapy that was unsuccessful.  EGM was suspended during shock.  All therapies failed per device, EGM with VT accelerating then spontaneously breaks with an A pacing event.  She had 7 subsequent V-sensing episodes that appeared to be more V's than A's at a slower rate (~120 bpm) but unable to confirm as marker only EGM.   Since last being seen in our clinic the patient reports she has been doing ok.  With episode before, she notes she felt tingling in her head and felt like she needed to sit down. She laid down on the bed and then later woke up. She reports she was unaware she had been shocked.  She denies chest pain, palpitations, dyspnea, PND, orthopnea, nausea, vomiting, dizziness, syncope, edema, weight gain, or early satiety.   Review of systems complete and found to be negative unless listed in HPI.    EP Information / Studies Reviewed:    EKG is not ordered today. EKG from 05/01/2023 reviewed which showed AV dual paced rhythm / BiV pacing, 68 bpm  EKG Interpretation Date/Time:  Monday September 18 2023 10:34:07 EST Ventricular Rate:  81 PR Interval:  168 QRS Duration:  192 QT Interval:  446 QTC Calculation: 518 R Axis:   141  Text Interpretation: AV dual-paced rhythm Confirmed by Canary Brim ([id number]) on 09/18/2023 12:49:50 PM   ICD Interrogation-  reviewed in detail today,   See PACEART report.  Device History: Medtronic BiV ICD implanted 06/28/08 with BiV upgrade 07/28/16 for CHB, HFrEF. Generator change 01/24/2023.  History of appropriate therapy: Yes 08/24/23 HV, ATP History of AAD therapy: Yes; currently on amiodarone    Studies:  ECHO 03/2022 > LVEF 20-25%, LA severely dilated, RA mod dilated     Arrhythmia / AAD VT    Risk Assessment/Calculations:    CHA2DS2-VASc Score = 5   This indicates a 7.2% annual risk of stroke. The patient's score is based upon: CHF History: 1 HTN History: 0 Diabetes History: 0 Stroke History: 2 Vascular Disease History: 0 Age Score: 1 Gender Score: 1             Physical Exam:   VS:  BP (!) 100/28   Pulse 81   Ht 5\' 3"  (1.6 m)   Wt 130 lb 6.4 oz (59.1 kg)   LMP  (LMP Unknown)   SpO2 97%   BMI 23.10 kg/m    Wt Readings from Last 3 Encounters:  09/18/23 130 lb 6.4 oz (59.1 kg)  06/14/23 128 lb 12.8 oz (58.4 kg)  05/01/23 129 lb 3.2 oz (58.6 kg)     GEN: Well nourished, well developed in no acute distress NECK: No JVD; No carotid bruits CARDIAC: Regular rate and rhythm, no murmurs, rubs, gallops RESPIRATORY:  Clear to auscultation without rales, wheezing or rhonchi  ABDOMEN: Soft, non-tender, non-distended EXTREMITIES:  No edema; No deformity  ASSESSMENT AND PLAN:    Chronic Systolic Dysfunction due to NICM s/p Medtronic CRT-D  CHB VT / NSVT Recent ATP, HV therapy -continue amiodarone 200 mg BID > increased in setting of recent VT  -VT appears to have defervesced since 08/24/23. Her weight was up and during the time surrounding the event  -follow up in 3 months for VT burden, if remains suppressed, consider reduction of amiodarone to 200mg  daily, or addition of mexiletine.  Will review with Dr. Elberta Fortis.  -VT Zones reviewed, appropriate -euvolemic today -Stable on an appropriate medical regimen -Normal ICD function -See Pace Art report -No changes today -driving restrictions reviewed with patient  and husband > from 08/24/23  Permanent Atrial Fibrillation  Atrial Flutter  CHA2DS2-VASc 5. TSH stable in 04/2023 -continue amiodarone -check amiodarone labs > CMP, TSH, T4 at next visit  -pt reports she has her eyes checked Q4 months due to glaucoma  Secondary Hypercoagulable State  -continue Eliquis 2.5 mg BID, dose reviewed and appropriate by wt/Cr -Hgb stable in 12/2022   VT  -recent BMP, Mg+ wnl   Disposition:   Follow up with EP APP in 3 months   Signed, Canary Brim, NP-C, AGACNP-BC [facility] - Electrophysiology  09/18/2023, 1:03 PM

## 2023-09-18 ENCOUNTER — Ambulatory Visit: Payer: Medicare PPO | Attending: Pulmonary Disease | Admitting: Pulmonary Disease

## 2023-09-18 ENCOUNTER — Encounter: Payer: Self-pay | Admitting: Pulmonary Disease

## 2023-09-18 VITALS — BP 100/28 | HR 81 | Ht 63.0 in | Wt 130.4 lb

## 2023-09-18 DIAGNOSIS — I5022 Chronic systolic (congestive) heart failure: Secondary | ICD-10-CM | POA: Diagnosis not present

## 2023-09-18 DIAGNOSIS — I428 Other cardiomyopathies: Secondary | ICD-10-CM

## 2023-09-18 DIAGNOSIS — D6869 Other thrombophilia: Secondary | ICD-10-CM

## 2023-09-18 DIAGNOSIS — I442 Atrioventricular block, complete: Secondary | ICD-10-CM

## 2023-09-18 DIAGNOSIS — I472 Ventricular tachycardia, unspecified: Secondary | ICD-10-CM

## 2023-09-18 DIAGNOSIS — Z95810 Presence of automatic (implantable) cardiac defibrillator: Secondary | ICD-10-CM | POA: Diagnosis not present

## 2023-09-18 LAB — CUP PACEART INCLINIC DEVICE CHECK
Battery Remaining Longevity: 76 mo
Battery Voltage: 2.98 V
Brady Statistic AP VP Percent: 99.45 %
Brady Statistic AP VS Percent: 0.13 %
Brady Statistic AS VP Percent: 0 %
Brady Statistic AS VS Percent: 0.42 %
Brady Statistic RA Percent Paced: 100 %
Brady Statistic RV Percent Paced: 99.44 %
Date Time Interrogation Session: 20250203130059
HighPow Impedance: 37 Ohm
Implantable Lead Connection Status: 753985
Implantable Lead Connection Status: 753985
Implantable Lead Connection Status: 753985
Implantable Lead Implant Date: 20091104
Implantable Lead Implant Date: 20091104
Implantable Lead Implant Date: 20171214
Implantable Lead Location: 753858
Implantable Lead Location: 753859
Implantable Lead Location: 753860
Implantable Lead Model: 4196
Implantable Lead Model: 5076
Implantable Pulse Generator Implant Date: 20240611
Lead Channel Impedance Value: 1026 Ohm
Lead Channel Impedance Value: 323 Ohm
Lead Channel Impedance Value: 342 Ohm
Lead Channel Impedance Value: 380 Ohm
Lead Channel Impedance Value: 418 Ohm
Lead Channel Impedance Value: 646 Ohm
Lead Channel Pacing Threshold Amplitude: 0.5 V
Lead Channel Pacing Threshold Amplitude: 0.5 V
Lead Channel Pacing Threshold Amplitude: 0.5 V
Lead Channel Pacing Threshold Amplitude: 1.875 V
Lead Channel Pacing Threshold Amplitude: 2 V
Lead Channel Pacing Threshold Pulse Width: 0.4 ms
Lead Channel Pacing Threshold Pulse Width: 0.4 ms
Lead Channel Pacing Threshold Pulse Width: 0.4 ms
Lead Channel Pacing Threshold Pulse Width: 1 ms
Lead Channel Pacing Threshold Pulse Width: 1 ms
Lead Channel Sensing Intrinsic Amplitude: 10.4 mV
Lead Channel Setting Pacing Amplitude: 2 V
Lead Channel Setting Pacing Amplitude: 2 V
Lead Channel Setting Pacing Amplitude: 2.5 V
Lead Channel Setting Pacing Pulse Width: 0.4 ms
Lead Channel Setting Pacing Pulse Width: 1 ms
Lead Channel Setting Sensing Sensitivity: 0.3 mV
Zone Setting Status: 755011
Zone Setting Status: 755011

## 2023-09-18 NOTE — Patient Instructions (Signed)
 Medication Instructions:  Your physician recommends that you continue on your current medications as directed. Please refer to the Current Medication list given to you today.  *If you need a refill on your cardiac medications before your next appointment, please call your pharmacy*  Lab Work: None ordered If you have labs (blood work) drawn today and your tests are completely normal, you will receive your results only by: MyChart Message (if you have MyChart) OR A paper copy in the mail If you have any lab test that is abnormal or we need to change your treatment, we will call you to review the results.  Follow-Up: At [facility], you and your health needs are our priority.  As part of our continuing mission to provide you with exceptional heart care, we have created designated Provider Care Teams.  These Care Teams include your primary Cardiologist (physician) and Advanced Practice Providers (APPs -  Physician Assistants and Nurse Practitioners) who all work together to provide you with the care you need, when you need it.  Your next appointment:   3 month(s)  Provider:   Canary Brim, NP

## 2023-10-09 DIAGNOSIS — H401123 Primary open-angle glaucoma, left eye, severe stage: Secondary | ICD-10-CM | POA: Diagnosis not present

## 2023-10-09 DIAGNOSIS — G35 Multiple sclerosis: Secondary | ICD-10-CM | POA: Diagnosis not present

## 2023-10-09 DIAGNOSIS — H35372 Puckering of macula, left eye: Secondary | ICD-10-CM | POA: Diagnosis not present

## 2023-10-09 DIAGNOSIS — H401112 Primary open-angle glaucoma, right eye, moderate stage: Secondary | ICD-10-CM | POA: Diagnosis not present

## 2023-10-16 ENCOUNTER — Ambulatory Visit: Payer: Medicare PPO | Attending: Cardiology

## 2023-10-16 DIAGNOSIS — Z95810 Presence of automatic (implantable) cardiac defibrillator: Secondary | ICD-10-CM

## 2023-10-16 DIAGNOSIS — I5022 Chronic systolic (congestive) heart failure: Secondary | ICD-10-CM

## 2023-10-20 NOTE — Progress Notes (Signed)
 EPIC Encounter for ICM Monitoring  Patient Name: DORRAINE ELLENDER is a 73 y.o. female Date: 10/20/2023 Primary Care Physican: Emilio Aspen, MD Primary Cardiologist: [name] Electrophysiologist: [name] Bi-V Pacing:  99.7%    09/06/2022 Weight: 126 lbs 10/14/2022 Weight: 125 lbs 08/11/2023 Weight: 125-126 lbs  11/31/2024 Weight: 126-127 with fluctuation of 1-2 lbs 09/18/2023 Office Weight: 130 lbs  Pace-Terminated Episodes 1 of 1 on 2/21          Transmission results reviewed.     Optivol thoracic impedance suggesting normal fluid levels within the last month.   Message sent to device clinic triage 3/7 regarding 2/21 ATP episode   Prescribed:  Furosemide 40 mg take 1 tablet by mouth daily as needed.  Spironolactone 25 mg take 0.5 tablet (12.5 mg total) daily   Labs: 08/25/2023 Creatinine 2.43, BUN 36, Potassium 4.3, Sodium 143, GFR 15 07/20/2023 Creatinine 2.03, BUN 23, Potassium 4.2, Sodium 141, GFR 26, Mg 2.3 06/14/2023 Creatinine 2.29, BUN 33, Potassium 4.1, Sodium 138, GFR 22 12/28/2022 Creatinine 2.18, BUN 34, Potassium 5.1, Sodium 140, GFR 24 A complete set of results can be found in Results Review.   Recommendations:  No changes..   Follow-up plan: ICM clinic phone appointment on 4/72025.  91 day device clinic remote transmission 10/25/2023.    EP/Cardiology Office Visits:   12/25/2023 with Canary Brim, NP.  Recall 10/12/2023 with Dr Gala Romney     Copy of ICM check sent to Dr Elberta Fortis.   3 month ICM trend: 10/15/2023.    12-14 Month ICM trend:     Karie Soda, RN 10/20/2023 2:51 PM

## 2023-10-23 ENCOUNTER — Telehealth: Payer: Self-pay

## 2023-10-23 NOTE — Telephone Encounter (Signed)
 Patient had recent OV with Brandi, NP 09/18/23 for ATP/ICD shock. Patient had 1 ATP after apt on 10/06/23. Unable to see RA EGM marker. Patient is currently under driving restrictions and has f/u with Brandi 12/25/23 for 3 month f/u. Routing to [place] to inform.

## 2023-10-25 ENCOUNTER — Ambulatory Visit: Payer: Medicare PPO

## 2023-10-25 DIAGNOSIS — I428 Other cardiomyopathies: Secondary | ICD-10-CM

## 2023-10-25 LAB — CUP PACEART REMOTE DEVICE CHECK
Battery Remaining Longevity: 74 mo
Battery Voltage: 2.97 V
Brady Statistic RV Percent Paced: 95.71 %
Date Time Interrogation Session: 20250311215936
HighPow Impedance: 39 Ohm
Implantable Lead Connection Status: 753985
Implantable Lead Connection Status: 753985
Implantable Lead Connection Status: 753985
Implantable Lead Implant Date: 20091104
Implantable Lead Implant Date: 20091104
Implantable Lead Implant Date: 20171214
Implantable Lead Location: 753858
Implantable Lead Location: 753859
Implantable Lead Location: 753860
Implantable Lead Model: 4196
Implantable Lead Model: 5076
Implantable Pulse Generator Implant Date: 20240611
Lead Channel Impedance Value: 323 Ohm
Lead Channel Impedance Value: 323 Ohm
Lead Channel Impedance Value: 380 Ohm
Lead Channel Impedance Value: 418 Ohm
Lead Channel Impedance Value: 608 Ohm
Lead Channel Impedance Value: 988 Ohm
Lead Channel Pacing Threshold Amplitude: 0.5 V
Lead Channel Pacing Threshold Amplitude: 1.625 V
Lead Channel Pacing Threshold Pulse Width: 0.4 ms
Lead Channel Pacing Threshold Pulse Width: 1 ms
Lead Channel Sensing Intrinsic Amplitude: 8.8 mV
Lead Channel Setting Pacing Amplitude: 2 V
Lead Channel Setting Pacing Amplitude: 2 V
Lead Channel Setting Pacing Amplitude: 2.5 V
Lead Channel Setting Pacing Pulse Width: 0.4 ms
Lead Channel Setting Pacing Pulse Width: 1 ms
Lead Channel Setting Sensing Sensitivity: 0.3 mV
Zone Setting Status: 755011
Zone Setting Status: 755011

## 2023-10-27 NOTE — Telephone Encounter (Signed)
 LMTCB

## 2023-10-27 NOTE — Telephone Encounter (Signed)
 Spoke w/ pt. Asymptomatic. Hesitant to start new medications. Would like to see Canary Brim before initiation. Routed to EP scheduling.

## 2023-11-01 ENCOUNTER — Telehealth (HOSPITAL_COMMUNITY): Payer: Self-pay | Admitting: Cardiology

## 2023-11-01 NOTE — Telephone Encounter (Signed)
 PATIENT LEFT VM ON TRIAGE LINE STATING SHE IS HAVING A HARD TIME GETTING RID OF FLUID WOULD LIKE TO KNOW WHAT TO DO   RETURNED CALL -ADD ON FOR PROVIDER EVAL 3/20 @ 1130

## 2023-11-02 ENCOUNTER — Ambulatory Visit (HOSPITAL_COMMUNITY)
Admission: RE | Admit: 2023-11-02 | Discharge: 2023-11-02 | Disposition: A | Discharge status: Home / Self Care | Source: Ambulatory Visit | Attending: Physician Assistant | Admitting: Physician Assistant

## 2023-11-02 ENCOUNTER — Encounter (HOSPITAL_COMMUNITY): Payer: Self-pay

## 2023-11-02 VITALS — BP 106/84 | HR 64 | Ht 63.0 in | Wt 132.2 lb

## 2023-11-02 DIAGNOSIS — I5023 Acute on chronic systolic (congestive) heart failure: Secondary | ICD-10-CM | POA: Diagnosis not present

## 2023-11-02 DIAGNOSIS — I480 Paroxysmal atrial fibrillation: Secondary | ICD-10-CM | POA: Diagnosis not present

## 2023-11-02 DIAGNOSIS — Z7901 Long term (current) use of anticoagulants: Secondary | ICD-10-CM | POA: Insufficient documentation

## 2023-11-02 DIAGNOSIS — M7989 Other specified soft tissue disorders: Secondary | ICD-10-CM | POA: Diagnosis not present

## 2023-11-02 DIAGNOSIS — Z95810 Presence of automatic (implantable) cardiac defibrillator: Secondary | ICD-10-CM | POA: Diagnosis not present

## 2023-11-02 DIAGNOSIS — Z8249 Family history of ischemic heart disease and other diseases of the circulatory system: Secondary | ICD-10-CM | POA: Diagnosis not present

## 2023-11-02 DIAGNOSIS — N184 Chronic kidney disease, stage 4 (severe): Secondary | ICD-10-CM | POA: Diagnosis not present

## 2023-11-02 DIAGNOSIS — Z8673 Personal history of transient ischemic attack (TIA), and cerebral infarction without residual deficits: Secondary | ICD-10-CM | POA: Diagnosis not present

## 2023-11-02 DIAGNOSIS — I472 Ventricular tachycardia, unspecified: Secondary | ICD-10-CM | POA: Diagnosis not present

## 2023-11-02 DIAGNOSIS — Z79899 Other long term (current) drug therapy: Secondary | ICD-10-CM | POA: Insufficient documentation

## 2023-11-02 DIAGNOSIS — I428 Other cardiomyopathies: Secondary | ICD-10-CM | POA: Diagnosis not present

## 2023-11-02 DIAGNOSIS — I4821 Permanent atrial fibrillation: Secondary | ICD-10-CM | POA: Diagnosis not present

## 2023-11-02 LAB — COMPREHENSIVE METABOLIC PANEL
ALT: 23 U/L (ref 0–44)
AST: 22 U/L (ref 15–41)
Albumin: 3.7 g/dL (ref 3.5–5.0)
Alkaline Phosphatase: 112 U/L (ref 38–126)
Anion gap: 9 (ref 5–15)
BUN: 38 mg/dL — ABNORMAL HIGH (ref 8–23)
CO2: 23 mmol/L (ref 22–32)
Calcium: 9.5 mg/dL (ref 8.9–10.3)
Chloride: 109 mmol/L (ref 98–111)
Creatinine, Ser: 2.85 mg/dL — ABNORMAL HIGH (ref 0.44–1.00)
GFR, Estimated: 17 mL/min — ABNORMAL LOW (ref >60)
Glucose, Bld: 105 mg/dL — ABNORMAL HIGH (ref 70–99)
Potassium: 3.7 mmol/L (ref 3.5–5.1)
Sodium: 141 mmol/L (ref 135–145)
Total Bilirubin: 1.9 mg/dL — ABNORMAL HIGH (ref 0.0–1.2)
Total Protein: 6.6 g/dL (ref 6.5–8.1)

## 2023-11-02 LAB — BRAIN NATRIURETIC PEPTIDE: B Natriuretic Peptide: 368.5 pg/mL — ABNORMAL HIGH (ref 0.0–100.0)

## 2023-11-02 MED ORDER — MEXILETINE HCL 150 MG PO CAPS: 150.0000 mg | ORAL_CAPSULE | Freq: Two times a day (BID) | ORAL | 3 refills | Status: DC

## 2023-11-02 MED ORDER — FUROSEMIDE 40 MG PO TABS: 40.0000 mg | ORAL_TABLET | Freq: Two times a day (BID) | ORAL | 3 refills | Status: DC

## 2023-11-02 MED ORDER — POTASSIUM CHLORIDE CRYS ER 20 MEQ PO TBCR: 20.0000 meq | EXTENDED_RELEASE_TABLET | Freq: Every day | ORAL | 3 refills | Status: DC

## 2023-11-02 MED ORDER — METOLAZONE 2.5 MG PO TABS: 2.5000 mg | ORAL_TABLET | Freq: Once | ORAL | 0 refills | Status: DC

## 2023-11-02 NOTE — Progress Notes (Addendum)
 Advance [facility] Failure Clinic Note   PCP:  Emilio Aspen, MD  Cardiologist:  Donato Schultz, MD Primary HF: Bensimhon  Chief Complaint: Heart Failure follow-up   History of Present Illness:  Kristi Parker is a 73 y.o. female with history of VT, severe systolic HF due to NICM (EF 20-25%) s/p Medtronic BiV ICD, CVA 2004, PAF, and CKD III-IV.    She was first diagnosed with HF in 2013. She is unsure of the cause. She had a heart cath and did not have any blockages. We also follow her son in HF clinic for NICM. She states that multiple family members have severe HF and several have underwent transplant.   Based on CPX testing, we referred her to Dr. Edwena Blow at [facility]. She was seen there on 02/22/18 and they discussed need for advanced therapies including possible heart or heart-kidney transplant. She had f/u with them subsequently and felt not to be candidate for heart-kidney transplant but might be candidate for high-risk VAD. We reviewed case with [facility] and she was also turned down for heart/kidney transplant due to age. She has decided she is not interested in VAD.    Had repeat CPX in 10/19 with low Vo2 (13.5) and high slope (42). Repeat RHC done on 10/19 as below as part of transplant work up at [facility] showed elevated volume with CI 2.7  Echo 04/05/22 EF 20-25% Moderate MR   Had ICD generator change-out in 06/24.   Had sustained VT on device 08/24/23, received ATP X 18 followed by shock. Reports she passed out. Device interrogated 03/10. Received 1 ATP on 10/06/23. EP recommended addition of mexiletine, she wanted to defer until follow-up.  Here today as urgent visit d/t concerns for fluid retention. She has been more short of breath and fatigued with exertion over the last few months. Legs are swollen and having a hard time putting her shoes on. Weight is up 4 lb from last visit. She has been taking PRN lasix daily for at least a couple of weeks without much change in edema. Taking  all other medications as prescribed. Occasionally eats out, usually salmon and vegetables. Tries to watch sodium intake.    Past Medical History:  Diagnosis Date   Adenomatous colon polyp    Cervical radiculopathy at C5    CHF (congestive heart failure) (HCC)    Chronic systolic heart failure (HCC)    CKD (chronic kidney disease), stage III (HCC)    DDD (degenerative disc disease), cervical    DDD (degenerative disc disease), lumbar    Fibroid    GERD (gastroesophageal reflux disease)    Hearing loss 02/2010   L hearing loss/vertigo, steroids   History of seizure disorder    Related Hx   Mitral regurgitation    moderate   Multiple sclerosis (HCC) 1984   Nonischemic cardiomyopathy (HCC)    Moderate LVEF 35-40% by ECHO 2011, 25-30% by echo 2013   Permanent atrial fibrillation (HCC)    Pulmonary hypertension, secondary 06/04/2013   As a result of nonischemic cardiomyopathy EF 25-30%   Stroke [facility])    Right brain CVA, complete recovery 07/2003   Ventricular tachycardia (HCC)    Vertigo 02/2010   L hearing loss/vertigo, steroids   Past Surgical History:  Procedure Laterality Date   BIV ICD GENERATOR CHANGEOUT Parker/A 01/24/2023   Procedure: BIV ICD GENERATOR CHANGEOUT;  Surgeon: Regan Lemming, MD;  Location: [facility] INVASIVE CV LAB;  Service: Cardiovascular;  Laterality: Parker/A;  BIV PACEMAKER GENERATOR CHANGE OUT Parker/A 09/18/2014   Procedure: BIV PACEMAKER GENERATOR CHANGE OUT;  Surgeon: Hillis Range, MD;  Location: [facility] CATH LAB;  Service: Cardiovascular;  Laterality: Parker/A;   DILATION AND CURETTAGE OF UTERUS     EP IMPLANTABLE DEVICE Parker/A 07/28/2016   Procedure: ICD Implant;  Surgeon: Marinus Maw, MD;  Location: [facility] INVASIVE CV LAB;  Service: Cardiovascular;  Laterality: Parker/A;   HYSTEROSCOPY     PACEMAKER INSERTION     PTVP 08/2001 for complete heart block. Upgrade PTVP to MDT BiV 06/2008 by Dr Amil Amen   RIGHT HEART CATH Parker/A 05/17/2018   Procedure: RIGHT HEART CATH;  Surgeon: Dolores Patty, MD;  [facility]AS[facility] CV LAB;  Service: Cardiovascular;  Laterality: Parker/A;   RIGHT/LEFT HEART CATH AND CORONARY ANGIOGRAPHY Parker/A 02/02/2018   Procedure: RIGHT/LEFT HEART CATH AND CORONARY ANGIOGRAPHY;  Surgeon: Dolores Patty, MD;  Location: [facility] INVASIVE CV LAB;  Service: Cardiovascular;  Laterality: Parker/A;   TUBAL LIGATION      Current Outpatient Medications  Medication Sig Dispense Refill   acetaminophen (TYLENOL) 500 MG tablet Take 500-1,000 mg by mouth every 6 (six) hours as needed (for back pain).      ALPHAGAN P 0.1 % SOLN Place 1 drop into both eyes 2 (two) times daily.      amiodarone (PACERONE) 200 MG tablet Take 1 tablet (200 mg total) by mouth 2 (two) times daily. 180 tablet 3   Ascorbic Acid (VITAMIN C) 500 MG CHEW Chew 500 mg by mouth daily.     carvedilol (COREG) 3.125 MG tablet Take 1 tablet (3.125 mg total) by mouth 2 (two) times daily with a meal. 180 tablet 3   dapagliflozin propanediol (FARXIGA) 10 MG TABS tablet Take 1 tablet (10 mg total) by mouth daily. 90 tablet 3   ELIQUIS 2.5 MG TABS tablet TAKE 1 TABLET (2.5 MG TOTAL) BY MOUTH 2 (TWO) TIMES DAILY. NEEDS FOLLOW UP APPOINTMENT FOR MORE REFILLS 180 tablet 0   ENTRESTO 49-51 MG TAKE 1 TABLET BY MOUTH TWICE A DAY 180 tablet 3   fexofenadine (ALLEGRA) 180 MG tablet Take 180 mg by mouth daily as needed for allergies.      levothyroxine (SYNTHROID, LEVOTHROID) 50 MCG tablet Take 50 mcg by mouth daily before breakfast.      loratadine (CLARITIN) 10 MG tablet Take 10 mg by mouth daily as needed for allergies.     metolazone (ZAROXOLYN) 2.5 MG tablet Take 1 tablet (2.5 mg total) by mouth once for 1 dose. TODAY 20-30 MINUTES BEFORE FUROSEMIDE DOSE 1 tablet 0   mexiletine (MEXITIL) 150 MG capsule Take 1 capsule (150 mg total) by mouth 2 (two) times daily. 60 capsule 3   potassium chloride SA (KLOR-CON M) 20 MEQ tablet Take 1 tablet (20 mEq total) by mouth daily. 30 tablet 3   furosemide (LASIX) 40 MG tablet Take 1  tablet (40 mg total) by mouth 2 (two) times daily. 60 tablet 3   No current facility-administered medications for this encounter.    Allergies:   Patient has no known allergies.   Social History:  The patient  reports that she has never smoked. She has never used smokeless tobacco. She reports current alcohol use. She reports that she does not use drugs.   Family History:  The patient's family history includes CAD in an other family member; Cancer in her father; Diabetes in her mother; Multiple sclerosis in her mother.   ROS:  Please see the  history of present illness.   All other systems are personally reviewed and negative.   Vitals:   11/02/23 1132  BP: 106/84  Pulse: 64  Weight: 60 kg (132 lb 3.2 oz)  Height: 5\' 3"  (1.6 m)    Wt Readings from Last 3 Encounters:  11/02/23 60 kg (132 lb 3.2 oz)  09/18/23 59.1 kg (130 lb 6.4 oz)  06/14/23 58.4 kg (128 lb 12.8 oz)   Exam:  General:  Fatigued appearing elderly female Neck: Jvp 8-9.  Cor: Regular rate & rhythm. No murmurs. Lungs: clear Abdomen: soft, nontender, nondistended. Extremities: 2+ edema to knees Neuro: alert & orientedx3. Affect pleasant   Recent Labs: 12/28/2022: Hemoglobin 11.1; Platelets 124 05/01/2023: ALT 20; TSH 1.330 06/14/2023: B Natriuretic Peptide 211.2 08/25/2023: BUN 36; Creatinine, Ser 2.43; Magnesium 2.5; Potassium 4.3; Sodium 143  Personally reviewed    ICD interrogation: OptiVol starting to trend up, thoracic impedance below threshold, no AT/AF, no treated VT/VF since episode in February, activity 0.8 hr/day, 98% BiV paced   ASSESSMENT AND PLAN:  1. Acute on chronic Systolic Heart Failure due to NICM. S/p Medtronic BiV ICD. Likely familial CM.  - Echo (9/21): EF 25-30%, severe LV dysfunction, RV ok. - Echo (8/20): EF 25% RV normal Personally reviewed - Echo (12/14/17): EF 20-25%, trivial AI, mild to mod MR, mildly reduced RV, severely dilated RA and LA, PA pressures moderately to severely  increased.  - CPX (05/16/18): pVO2 12.1, slope 48 - RHC (05/17/18): NICM with elevated filling pressures and relatively preserved output, suggestive of significant MR - LHC (02/02/18) with normal coronaries. RHC showed severe NICM with well compensated hemodynamics. - CPX (12/27/17): Peak VO2 13.5, slope 42 - Echo 9/21; EF 25-30% - Echo  04/05/22 EF 20-25%  - NYHA IIIb. Worse. Appears volume overloaded. ReDS elevated at 42%. OptiVol beginning to trend up on device and thoracic impedance trending below threshold. Will have her take 2.5 mg metolazone prior to her 40 mg dose of lasix this afternoon. Start 40 mg lasix BID tomorrow. Start 20 mEq KCL daily - Continue carvedilol 3.125 mg bid. - Continue Entresto 49/51 mg bid. - Continue Farxiga 10 mg daily. - Off spiro due to CKD and hyperkalemia - Blood type AB+. She has been seen by Dr. Edwena Blow at [facility] and felt not to be candidate for heart-kidney transplant, but might be candidate for high-risk VAD. [facility] declined seeing her for heart-kidney transplant due to age.   - Last discussed VAD on 6/21 (versus repeating CPX) but says she had been stable and wanted to hold off.  - Worry that she now appears to be nearing end-stage. Discussed with Dr. Gala Romney. Likely too high risk for VAD. Would also be concerned for recurrent VT following implant. Continue medical management. This was discussed with patient and her spouse in detail. - Her son has been diagnosed LMNA cardiomyopathy. She saw Dr. Jomarie Longs for genetic counseling. Reports she did not complete genetic testing d/t cost. - Previously discussed Barostim device but she was not interested.  - Labs today - Repeat echo ordered. - F/u next week  2. PAF - No recurrence on device interrogation - CHA2DS2VASC of at least 5. - Continue amiodarone 200 mg daily - Continue Eliquis 2.5 mg BID - No bleeding    3. CKD IV - Baseline SCr low 2s - Labs today - Continue Farxiga - Follows with [facility]  4.  MR - Moderate on most recent echo 08/23 - Repeat echo ordered as above  5. Hx of VT s/p Medtronic BiV ICD - Recurrent VT in 01/25 s/p multiple ATPs/shock and 02/25 treated with ATP - Reiterated no driving X 6 months - EP recommended addition of mexiletine. We discussed the indications for the medication today. She is willing to start mexiletine, will prescribe 150 mg BID as suggested by EP. - Continue amiodarone   Follow-up: 1 week to assess volume  Kristi Oceguera N, PA-C  11/02/2023 12:45 PM

## 2023-11-02 NOTE — Patient Instructions (Addendum)
 Medication Changes:  TAKE METOLAZONE 2.5MG  THIS AFTERNOON 20-30 MINUTES BEFORE YOUR 40MG  OF LASIX (FUROSEMIDE)  INCREASE FUROSEMIDE TO 40MG  TWICE DAILY   START POTASSIUM ONCE DAILY   START MEXILETINE 150MG  TWICE DAILY  Lab Work:  Labs done today, your results will be available in MyChart, we will contact you for abnormal readings.  ECHOCARDIOGRAM AS SCHEDULED   Follow-Up in: NEXT WEEK AS SCHEDULED   At the [facility], you and your health needs are our priority. We have a designated team specialized in the treatment of Heart Failure. This Care Team includes your primary Heart Failure Specialized Cardiologist (physician), Advanced Practice Providers (APPs- Physician Assistants and Nurse Practitioners), and Pharmacist who all work together to provide you with the care you need, when you need it.   You may see any of the following providers on your designated Care Team at your next follow up:  Dr. Arvilla Meres Dr. Marca Ancona Dr. Dorthula Nettles Dr. Theresia Bough Tonye Becket, NP Robbie Lis, [place] [facility] [place], [place] Brynda Peon, NP [place] Lee, NP Karle Plumber, PharmD   Please be sure to bring in all your medications bottles to every appointment.   Need to Contact us:  If you have any questions or concerns before your next appointment please send [place] a message through [place] or call our office at [phone].    TO LEAVE A MESSAGE FOR THE NURSE SELECT OPTION 2, PLEASE LEAVE A MESSAGE INCLUDING: YOUR NAME DATE OF BIRTH CALL BACK NUMBER REASON FOR CALL**this is important as we prioritize the call backs  YOU WILL RECEIVE A CALL BACK THE SAME DAY AS LONG AS YOU CALL BEFORE 4:00 PM

## 2023-11-02 NOTE — Progress Notes (Signed)
 ReDS Vest / Clip - 11/02/23 1100       ReDS Vest / Clip   Station Marker A    Ruler Value 27    ReDS Value Range High volume overload    ReDS Actual Value 42

## 2023-11-03 ENCOUNTER — Telehealth (HOSPITAL_COMMUNITY): Payer: Self-pay | Admitting: Cardiology

## 2023-11-03 NOTE — Telephone Encounter (Signed)
 Pt/pts husband called to request otc meds for mucus removal Reports he has mussinex on hand  Reviewed OTC list Guaifenesin and dextromethorphan Mussunex-D ok to start    Advanced Cardiac Care Program Guidelines for over-the-counter treatments for mild illnesses  Medications You can Use  Brand Name Generic Name Uses Comments  Benadryl Diphenhydramine Cold/allergy symptoms Do not use any Bendaryl products that say "congestion"  Loratadine Claritin Cold/allergy symptoms --  Coricidin HBP Varies with formulation Cold/flu  symptoms --  Mussinex Guaifenesin Mucus removal  --  Delsym Dextromethorphan Cough suppression --  Robitussin Dextromethorphan and guaifenesin Cough suppression + mucus removal Do not use robitussin products that include "PE", "PM," or "CF"  Vicks VapoRub Camphor, eucalyptus oil, menthol Cough and congestion  --  Vicks 44 Varies with formulation Cough suppression Do not use Vicks 44 D  Saline Nasal Spray Saline Congestion relief --   Medications to AVOID  Brand Name Generic Name  Alka-Seltzer Calcium carbonate + aspirin  Sudafed Pseudoephedrine  Comtrex Acetaminophen + pseudoephedrine  Theraflu Acetaminophen + phenylephrine  Dimetapp Brompheniramine + dextromethorphan + phenylephrine  Medicated nasal sprays Oxymetazoline, phenylephrine, etc.  Drixoral Dexbrompheniramine + pseudoephedrine  Chlor-Trimeton Chlorpheniramine  *AVOID AS THESE MEDICATIONS CAN CAUSE HIGH BLOOD PRESSURE AND HEART RATE  Common ingredients to avoid: pseudoephedrine, epinephrine, phenylephrine, brompheniramine, chlorpheniramine (ALWAYS READ LABLES CAREFULLY)  THIS IS NOT A COMPREHENSIVE LIST - If unsure about a medicine, PLEASE CALL AND ASK!

## 2023-11-07 ENCOUNTER — Telehealth (HOSPITAL_COMMUNITY): Payer: Self-pay

## 2023-11-07 NOTE — Progress Notes (Signed)
 Advance Heart Failure Clinic Note   PCP:  Emilio Aspen, MD  Cardiologist:  Donato Schultz, MD Primary HF: Bensimhon  Chief Complaint: Heart Failure follow-up   History of Present Illness:  Kristi Parker is a 73 y.o. female with history of VT, severe systolic HF due to NICM (EF 20-25%) s/p Medtronic BiV ICD, CVA 2004, PAF, and CKD III-IV.    She was first diagnosed with HF in 2013. She is unsure of the cause. She had a heart cath and did not have any blockages. We also follow her son in HF clinic for NICM. She states that multiple family members have severe HF and several have underwent transplant.   Based on CPX testing, we referred her to Dr. Edwena Blow at [facility]. She was seen there on 02/22/18 and they discussed need for advanced therapies including possible heart or heart-kidney transplant. She had f/u with them subsequently and felt not to be candidate for heart-kidney transplant but might be candidate for high-risk VAD. We reviewed case with [facility] and she was also turned down for heart/kidney transplant due to age. She has decided she is not interested in VAD.    Had repeat CPX in 10/19 with low Vo2 (13.5) and high slope (42). Repeat RHC done on 10/19 as below as part of transplant work up at [facility] showed elevated volume with CI 2.7  Echo 04/05/22 EF 20-25% Moderate MR   Had ICD generator change-out in 06/24.   Had sustained VT on device 08/24/23, received ATP X 18 followed by shock. Reports she passed out. Device interrogated 03/10. Received 1 ATP on 10/06/23. EP recommended addition of mexiletine, she wanted to defer until follow-up.  Seen last wk for HF f/u. Worked in as urgent visit for fluid retention and increased SOB. ReDs was elevated at 42% and optivol fluid index was trending up. She was instructed to increase Lasix to 40 mg bid and to take a dose of Metolazone 2.5 mg x 1. Pt also agreed to start Mexiletine for VT.   She returns back today for f/u. Her w/ husband. Wt  down 10 lb from last wt and ReDs improving, now at 37%. She however c/w NYHA Class III symptoms and marked fatigued. Feels more drained this weak. BP is low 92/60. Device interrogation shows Optivol fluid index down and impedence now up above reference. 99% BiV pacing. No further VT. No AF/AF.      Past Medical History:  Diagnosis Date   Adenomatous colon polyp    Cervical radiculopathy at C5    CHF (congestive heart failure) (HCC)    Chronic systolic heart failure (HCC)    CKD (chronic kidney disease), stage III (HCC)    DDD (degenerative disc disease), cervical    DDD (degenerative disc disease), lumbar    Fibroid    GERD (gastroesophageal reflux disease)    Hearing loss 02/2010   L hearing loss/vertigo, steroids   History of seizure disorder    Related Hx   Mitral regurgitation    moderate   Multiple sclerosis (HCC) 1984   Nonischemic cardiomyopathy (HCC)    Moderate LVEF 35-40% by ECHO 2011, 25-30% by echo 2013   Permanent atrial fibrillation (HCC)    Pulmonary hypertension, secondary 06/04/2013   As a result of nonischemic cardiomyopathy EF 25-30%   Stroke [facility])    Right brain CVA, complete recovery 07/2003   Ventricular tachycardia (HCC)    Vertigo 02/2010   L hearing loss/vertigo, steroids  Past Surgical History:  Procedure Laterality Date   BIV ICD GENERATOR CHANGEOUT N/A 01/24/2023   Procedure: BIV ICD GENERATOR CHANGEOUT;  Surgeon: Regan Lemming, MD;  Location: [facility] INVASIVE CV LAB;  Service: Cardiovascular;  Laterality: N/A;   BIV PACEMAKER GENERATOR CHANGE OUT N/A 09/18/2014   Procedure: BIV PACEMAKER GENERATOR CHANGE OUT;  Surgeon: Hillis Range, MD;  Location: [facility] CATH LAB;  Service: Cardiovascular;  Laterality: N/A;   DILATION AND CURETTAGE OF UTERUS     EP IMPLANTABLE DEVICE N/A 07/28/2016   Procedure: ICD Implant;  Surgeon: Marinus Maw, MD;  Location: [facility] INVASIVE CV LAB;  Service: Cardiovascular;  Laterality: N/A;   HYSTEROSCOPY     PACEMAKER INSERTION      PTVP 08/2001 for complete heart block. Upgrade PTVP to MDT BiV 06/2008 by Dr Amil Amen   RIGHT HEART CATH N/A 05/17/2018   Procedure: RIGHT HEART CATH;  Surgeon: Dolores Patty, MD;  Location: [facility] INVASIVE CV LAB;  Service: Cardiovascular;  Laterality: N/A;   RIGHT/LEFT HEART CATH AND CORONARY ANGIOGRAPHY N/A 02/02/2018   Procedure: RIGHT/LEFT HEART CATH AND CORONARY ANGIOGRAPHY;  Surgeon: Dolores Patty, MD;  Location: [facility] INVASIVE CV LAB;  Service: Cardiovascular;  Laterality: N/A;   TUBAL LIGATION      Current Outpatient Medications  Medication Sig Dispense Refill   acetaminophen (TYLENOL) 500 MG tablet Take 500-1,000 mg by mouth every 6 (six) hours as needed (for back pain).      ALPHAGAN P 0.1 % SOLN Place 1 drop into both eyes 2 (two) times daily.      amiodarone (PACERONE) 200 MG tablet Take 1 tablet (200 mg total) by mouth 2 (two) times daily. 180 tablet 3   Ascorbic Acid (VITAMIN C) 500 MG CHEW Chew 500 mg by mouth daily.     carvedilol (COREG) 3.125 MG tablet Take 1 tablet (3.125 mg total) by mouth 2 (two) times daily with a meal. 180 tablet 3   dapagliflozin propanediol (FARXIGA) 10 MG TABS tablet Take 1 tablet (10 mg total) by mouth daily. 90 tablet 3   ELIQUIS 2.5 MG TABS tablet TAKE 1 TABLET (2.5 MG TOTAL) BY MOUTH 2 (TWO) TIMES DAILY. NEEDS FOLLOW UP APPOINTMENT FOR MORE REFILLS 180 tablet 0   ENTRESTO 49-51 MG TAKE 1 TABLET BY MOUTH TWICE A DAY 180 tablet 3   fexofenadine (ALLEGRA) 180 MG tablet Take 180 mg by mouth daily as needed for allergies.      levothyroxine (SYNTHROID, LEVOTHROID) 50 MCG tablet Take 50 mcg by mouth daily before breakfast.      loratadine (CLARITIN) 10 MG tablet Take 10 mg by mouth daily as needed for allergies.     mexiletine (MEXITIL) 150 MG capsule Take 1 capsule (150 mg total) by mouth 2 (two) times daily. 60 capsule 3   potassium chloride SA (KLOR-CON M) 20 MEQ tablet Take 1 tablet (20 mEq total) by mouth daily. 30 tablet 3   furosemide  (LASIX) 40 MG tablet Take 1 tablet (40 mg total) by mouth daily.     metolazone (ZAROXOLYN) 2.5 MG tablet Take 1 tablet (2.5 mg total) by mouth daily as needed. AS DIRECTED BY HEART FAILURE CLINIC 5 tablet 0   No current facility-administered medications for this encounter.    Allergies:   Patient has no known allergies.   Social History:  The patient  reports that she has never smoked. She has never used smokeless tobacco. She reports current alcohol use. She reports that she does not use  drugs.   Family History:  The patient's family history includes CAD in an other family member; Cancer in her father; Diabetes in her mother; Multiple sclerosis in her mother.   ROS:  Please see the history of present illness.   All other systems are personally reviewed and negative.   Vitals:   11/08/23 1052  BP: 92/60  Pulse: 81  SpO2: 98%  Weight: 55.6 kg (122 lb 9.6 oz)  Height: 5\' 3"  (1.6 m)     Wt Readings from Last 3 Encounters:  11/08/23 55.6 kg (122 lb 9.6 oz)  11/02/23 60 kg (132 lb 3.2 oz)  09/18/23 59.1 kg (130 lb 6.4 oz)   PHYSICAL EXAM: General:  fatigued appearing. No respiratory difficulty HEENT: normal Neck: supple. JVD 8 cm. Carotids 2+ bilat; no bruits. No lymphadenopathy or thyromegaly appreciated. Cor: PMI nondisplaced. Regular rate & rhythm. No rubs, gallops or murmurs. Lungs: clear Abdomen: soft, nontender, nondistended. No hepatosplenomegaly. No bruits or masses. Good bowel sounds. Extremities: no cyanosis, clubbing, rash, edema Neuro: alert & oriented x 3, cranial nerves grossly intact. moves all 4 extremities w/o difficulty. Affect pleasant.    Recent Labs: 12/28/2022: Hemoglobin 11.1; Platelets 124 05/01/2023: TSH 1.330 08/25/2023: Magnesium 2.5 11/02/2023: ALT 23; B Natriuretic Peptide 368.5 11/08/2023: BUN 58; Creatinine, Ser 3.89; Potassium 3.4; Sodium 139  Personally reviewed    ICD interrogation: Optivol fluid index down and impedence now up above  reference. 99% BiV pacing. No further VT. No AF/AF   ASSESSMENT AND PLAN:  1. Acute on chronic Systolic Heart Failure due to NICM. S/p Medtronic BiV ICD. Likely familial CM.  - Echo (9/21): EF 25-30%, severe LV dysfunction, RV ok. - Echo (8/20): EF 25% RV normal Personally reviewed - Echo (12/14/17): EF 20-25%, trivial AI, mild to mod MR, mildly reduced RV, severely dilated RA and LA, PA pressures moderately to severely increased.  - CPX (05/16/18): pVO2 12.1, slope 48 - RHC (05/17/18): NICM with elevated filling pressures and relatively preserved output, suggestive of significant MR - LHC (02/02/18) with normal coronaries. RHC showed severe NICM with well compensated hemodynamics. - CPX (12/27/17): Peak VO2 13.5, slope 42 - Echo 9/21; EF 25-30% - Echo  04/05/22 EF 20-25%  - NYHA IIIb. Volume status improved w/ recent diuretic increase. ReDs 42>>37%, Optivol Index/Impedence curve suggest euvolemia. BP soft and she feels more fatigued this week. Check BMP to check SCr/BUN make sure she is not over diuresed - Continue carvedilol 3.125 mg bid. - Continue Entresto 49/51 mg bid. - Continue Farxiga 10 mg daily. - Off spiro due to CKD and hyperkalemia - Blood type AB+. She has been seen by Dr. Edwena Blow at [facility] and felt not to be candidate for heart-kidney transplant, but might be candidate for high-risk VAD. [facility] declined seeing her for heart-kidney transplant due to age.   - Last discussed VAD on 6/21 (versus repeating CPX) but says she had been stable and wanted to hold off.  - Worry that she now appears to be nearing end-stage. This has been discussed w/ Dr. Gala Romney. Likely too high risk for VAD. Would also be concerned for recurrent VT following implant.  - given persistent symptoms despite improvement in volume status and low BP limiting GDMT titration, I re-introduced consideration of Barostim and provided pt and husband w/ patient information to review. They will consider and discuss at next f/u visit  w/ Dr. Gala Romney. May also need RHC arranged next visit  - Her son has been diagnosed LMNA cardiomyopathy. She saw  Dr. Jomarie Longs for genetic counseling. Reports she did not complete genetic testing d/t cost. - She is scheduled for repeat echo on 4/10. We will arrange for f/u visit w/ Dr. Gala Romney next day on 4/11 in [place] (pt lives in [place])  2. PAF - no detection on device interrogation  - Continue amiodarone 200 mg daily - Continue Eliquis 2.5 mg BID   3. CKD IV - Baseline SCr low 2s - Follows with [facility] - on Farxiga  - check BMP today   4. MR - Moderate on most recent echo 08/23 - plan repeat echo next month    5. Hx of VT s/p Medtronic BiV ICD - Recurrent VT in 01/25 s/p multiple ATPs/shock and 02/25 treated with ATP - now on Mexiletine + amiodarone. No further VT detection on device interrogation  - no driving X 6 months  F/u w/ Dr. Gala Romney in 2 wks.   Robbie Lis, PA-C  11/08/2023 5:32 PM

## 2023-11-07 NOTE — Telephone Encounter (Signed)
 Called to confirm/remind patient of their appointment at the [facility] Failure Clinic on 11/08/2023 11:00.   Appointment:   [x] Confirmed  [] Left mess   [] No answer/No voice mail  [] Phone not in service  Patient reminded to bring all medications and/or complete list.  Confirmed patient has transportation. Gave directions, instructed to utilize valet parking.

## 2023-11-08 ENCOUNTER — Encounter (HOSPITAL_COMMUNITY): Payer: Self-pay

## 2023-11-08 ENCOUNTER — Ambulatory Visit (HOSPITAL_COMMUNITY)
Admission: RE | Admit: 2023-11-08 | Discharge: 2023-11-08 | Disposition: A | Discharge status: Home / Self Care | Source: Ambulatory Visit | Attending: Cardiology | Admitting: Cardiology

## 2023-11-08 ENCOUNTER — Other Ambulatory Visit: Payer: Self-pay | Admitting: Cardiology

## 2023-11-08 ENCOUNTER — Telehealth (HOSPITAL_COMMUNITY): Payer: Self-pay

## 2023-11-08 VITALS — BP 92/60 | HR 81 | Ht 63.0 in | Wt 122.6 lb

## 2023-11-08 DIAGNOSIS — N184 Chronic kidney disease, stage 4 (severe): Secondary | ICD-10-CM | POA: Diagnosis not present

## 2023-11-08 DIAGNOSIS — R0602 Shortness of breath: Secondary | ICD-10-CM | POA: Diagnosis not present

## 2023-11-08 DIAGNOSIS — Z8673 Personal history of transient ischemic attack (TIA), and cerebral infarction without residual deficits: Secondary | ICD-10-CM | POA: Insufficient documentation

## 2023-11-08 DIAGNOSIS — Z79899 Other long term (current) drug therapy: Secondary | ICD-10-CM | POA: Insufficient documentation

## 2023-11-08 DIAGNOSIS — Z7984 Long term (current) use of oral hypoglycemic drugs: Secondary | ICD-10-CM | POA: Insufficient documentation

## 2023-11-08 DIAGNOSIS — I4821 Permanent atrial fibrillation: Secondary | ICD-10-CM | POA: Diagnosis not present

## 2023-11-08 DIAGNOSIS — Z7901 Long term (current) use of anticoagulants: Secondary | ICD-10-CM | POA: Insufficient documentation

## 2023-11-08 DIAGNOSIS — I472 Ventricular tachycardia, unspecified: Secondary | ICD-10-CM | POA: Insufficient documentation

## 2023-11-08 DIAGNOSIS — I5022 Chronic systolic (congestive) heart failure: Secondary | ICD-10-CM | POA: Insufficient documentation

## 2023-11-08 DIAGNOSIS — I428 Other cardiomyopathies: Secondary | ICD-10-CM | POA: Diagnosis not present

## 2023-11-08 LAB — BASIC METABOLIC PANEL
Anion gap: 12 (ref 5–15)
BUN: 58 mg/dL — ABNORMAL HIGH (ref 8–23)
CO2: 25 mmol/L (ref 22–32)
Calcium: 9.9 mg/dL (ref 8.9–10.3)
Chloride: 102 mmol/L (ref 98–111)
Creatinine, Ser: 3.89 mg/dL — ABNORMAL HIGH (ref 0.44–1.00)
GFR, Estimated: 12 mL/min — ABNORMAL LOW (ref >60)
Glucose, Bld: 103 mg/dL — ABNORMAL HIGH (ref 70–99)
Potassium: 3.4 mmol/L — ABNORMAL LOW (ref 3.5–5.1)
Sodium: 139 mmol/L (ref 135–145)

## 2023-11-08 MED ORDER — FUROSEMIDE 40 MG PO TABS: 40.0000 mg | ORAL_TABLET | Freq: Every day | ORAL | Status: DC

## 2023-11-08 MED ORDER — METOLAZONE 2.5 MG PO TABS: 2.5000 mg | ORAL_TABLET | Freq: Every day | ORAL | 0 refills | Status: AC | PRN

## 2023-11-08 NOTE — Telephone Encounter (Signed)
 Spoke with patient regarding the following results. Patient made aware and patient verbalized understanding.   Medication list updated to reflect changes. Patient aware that device clinic will reach out regarding device check next week. Repeat labs ordered and scheduled for Tuesday 4/1 at 12. Patient aware of appointment time and date.   Advised patient to call back to office with any issues, questions, or concerns. Patient verbalized understanding.

## 2023-11-08 NOTE — Telephone Encounter (Signed)
-----   Message from Frederica sent at 11/08/2023  3:55 PM EDT ----- Scr elevated well above baseline. Suspect low BP and fatigue likely due to over diuresis. Hold tonight's dose of Lasix, and starting tomorrow reduce down to once daily. No Metolazone.   K low at 3.4. Instruct to take 20 mEq of KCl now.   Repeat BMP on Monday and send remote device transmission to recheck Optivol. If SCr is still trending up next wk, will need to set up for RHC

## 2023-11-08 NOTE — Patient Instructions (Signed)
 Medication Changes:  METOLAZONE 2.5MG  REFILLED TODAY- FOR USE ONLY AS NEEDED AND DIRECTED BY HEART FAILURE CLINIC  Lab Work:  Labs done today, your results will be available in MyChart, we will contact you for abnormal readings.  Follow-Up in: ON APRIL THE 11TH AT 1115 WITH DR.  Gala Romney AT [facility]- IN THE [facility]ILD[facility] 2ND FLOOR   At the Advanced [facility] Failure Clinic, you and your health needs are our priority. We have a designated team specialized in the treatment of Heart Failure. This Care Team includes your primary Heart Failure Specialized Cardiologist (physician), Advanced Practice Providers (APPs- Physician Assistants and Nurse Practitioners), and Pharmacist who all work together to provide you with the care you need, when you need it.   You may see any of the following providers on your designated Care Team at your next follow up:  Dr. Arvilla Meres Dr. Marca Ancona Dr. Dorthula Nettles Dr. Theresia Bough Tonye Becket, NP Robbie Lis, [place] [facility], [place] Brynda Peon, NP [place] Lee, NP Karle Plumber, PharmD   Please be sure to bring in all your medications bottles to every appointment.   Need to Contact us:  If you have any questions or concerns before your next appointment please send [place] a message through [place] or call our office at [phone].    TO LEAVE A MESSAGE FOR THE NURSE SELECT OPTION 2, PLEASE LEAVE A MESSAGE INCLUDING: YOUR NAME DATE OF BIRTH CALL BACK NUMBER REASON FOR CALL**this is important as we prioritize the call backs  YOU WILL RECEIVE A CALL BACK THE SAME DAY AS LONG AS YOU CALL BEFORE 4:00 PM

## 2023-11-09 LAB — PRO B NATRIURETIC PEPTIDE: NT-Pro BNP: 2734 pg/mL — ABNORMAL HIGH (ref 0–301)

## 2023-11-09 NOTE — Telephone Encounter (Signed)
 Patient called to request further explanation of med changes from 3/26 lab results  3/26 hold PM dose of furosemide 3/26 do NOT take METOLAZONE 3/26 take one dose of potassium @ 20 meq  3/27 start new dose of lasix at 40 mg daily  Return for labs 3/31   Pt verified information about Was verbally able to recall instructions Wanted documentation of increased fatigue today for providers FYI

## 2023-11-13 ENCOUNTER — Ambulatory Visit: Attending: Cardiology

## 2023-11-13 DIAGNOSIS — I5022 Chronic systolic (congestive) heart failure: Secondary | ICD-10-CM

## 2023-11-13 DIAGNOSIS — Z95810 Presence of automatic (implantable) cardiac defibrillator: Secondary | ICD-10-CM

## 2023-11-14 ENCOUNTER — Encounter (HOSPITAL_COMMUNITY): Payer: Self-pay | Admitting: Cardiology

## 2023-11-14 ENCOUNTER — Ambulatory Visit (HOSPITAL_COMMUNITY)
Admission: RE | Admit: 2023-11-14 | Discharge: 2023-11-14 | Disposition: A | Discharge status: Home / Self Care | Source: Ambulatory Visit | Attending: Internal Medicine | Admitting: Internal Medicine

## 2023-11-14 DIAGNOSIS — I5022 Chronic systolic (congestive) heart failure: Secondary | ICD-10-CM | POA: Diagnosis not present

## 2023-11-14 LAB — BASIC METABOLIC PANEL WITH GFR
Anion gap: 12 (ref 5–15)
BUN: 69 mg/dL — ABNORMAL HIGH (ref 8–23)
CO2: 18 mmol/L — ABNORMAL LOW (ref 22–32)
Calcium: 9.8 mg/dL (ref 8.9–10.3)
Chloride: 109 mmol/L (ref 98–111)
Creatinine, Ser: 3.8 mg/dL — ABNORMAL HIGH (ref 0.44–1.00)
GFR, Estimated: 12 mL/min — ABNORMAL LOW (ref >60)
Glucose, Bld: 113 mg/dL — ABNORMAL HIGH (ref 70–99)
Potassium: 4.8 mmol/L (ref 3.5–5.1)
Sodium: 139 mmol/L (ref 135–145)

## 2023-11-14 NOTE — Progress Notes (Signed)
  Received: Today Allayne Butcher, PA-C  Durell Lofaso, Josephine Igo, RN Thank you!

## 2023-11-14 NOTE — Progress Notes (Signed)
 EPIC Encounter for ICM Monitoring  Patient Name: Kristi Parker is a 73 y.o. female Date: 11/14/2023 Primary Care Physican: Kristi Aspen, MD Primary Cardiologist: Kristi Parker Electrophysiologist: Kristi Parker Bi-V Pacing:  100%    09/06/2022 Weight: 126 lbs 10/14/2022 Weight: 125 lbs 08/11/2023 Weight: 125-126 lbs  11/31/2024 Weight: 126-127 with fluctuation of 1-2 lbs 09/18/2023 Office Weight: 130 lbs  11/08/2023 Office Weight: 122 lbs         Spoke with patient and heart failure questions reviewed.  Transmission results reviewed.  Pt reports being so tired that her husband has to help her stand up.     Optivol thoracic impedance suggesting possible dryness as a result from diuresing on 3/20 but now trend back to normal baseline.      Prescribed:  Furosemide 40 mg take 1 tablet by mouth daily.  Metolazone 2.5 mg take 1 tablet as needed. As directed by HF clinic Potassium 20 mEq take 1 tablet daily   Labs: 11/14/2023 Creatinine 3.80, BUN 69, Potassium 4.8, Sodium 139, GFR 12  11/08/2023 Creatinine 3.89, BUN 58, Potassium 3.4, Sodium 139, GFR 12  11/02/2023 Creatinine 2.85, BUN 38, Potassium 3.7, Sodium 141, GFR 17  08/25/2023 Creatinine 2.43, BUN 36, Potassium 4.3, Sodium 143, GFR 15 07/20/2023 Creatinine 2.03, BUN 23, Potassium 4.2, Sodium 141, GFR 26, Mg 2.3 06/14/2023 Creatinine 2.29, BUN 33, Potassium 4.1, Sodium 138, GFR 22 12/28/2022 Creatinine 2.18, BUN 34, Potassium 5.1, Sodium 140, GFR 24 A complete set of results can be found in Results Review.   Recommendations:  Copy sent to Kristi Medici, PA at [facility] clinic for review as requested after 3/26 OV.   Follow-up plan: ICM clinic phone appointment on 11/20/2023.  91 day device clinic remote transmission 01/24/2024.    EP/Cardiology Office Visits:  11/21/2023 with Kristi Brim, NP.  11/24/2023 with Dr Kristi Parker     Copy of ICM check sent to Dr Kristi Parker.   3 month ICM trend: 11/13/2023.    12-14 Month ICM trend:      Kristi Soda, RN 11/14/2023 1:14 PM

## 2023-11-15 ENCOUNTER — Other Ambulatory Visit: Payer: Self-pay | Admitting: Internal Medicine

## 2023-11-15 ENCOUNTER — Encounter (HOSPITAL_COMMUNITY): Payer: Self-pay

## 2023-11-18 NOTE — Progress Notes (Unsigned)
 Electrophysiology Office Note:   Date:  11/21/2023  ID:  Kristi Parker, DOB 1951-06-24, MRN [id number]  Primary Cardiologist: Donato Schultz, MD Primary Heart Failure: None Electrophysiologist: Will Jorja Loa, MD       History of Present Illness:   Kristi Parker is a 73 y.o. female with h/o CHB, NICM s/p ICD, VT, AF/AFL, Multiple Sclerosis & CKD III-IV  seen today for routine electrophysiology followup.   She had VT 08/24/23 that received therapy with ATP x18, 40j HV that was unsuccessful.  Spontaneously broke (EGM suspended during shock, MDT industry reviewed, unable to obtain EGM).    Additional VT on 10/15/23 and received ATP which broke rhythm.  Pt was to start mexiletine but wanted to be seen in person before starting medication.   Since last being seen in our clinic the patient reports she feels as though she has had less energy in recent weeks.  No further known episodes of ICD shock.  She reports that her decreased activity level also correlates with known chronic back issues.  No issues with bleeding on Eliquis.  No specific device related concerns but is curious to see if she has had further therapy from the device.  In addition, the patient initially was hesitant to start mexiletine but ultimately did take the medication and has been tolerating it well after she started eating with taking the medication.  She denies chest pain, palpitations, dyspnea, PND, orthopnea, nausea, vomiting, dizziness, syncope, edema, weight gain, or early satiety.    Review of systems complete and found to be negative unless listed in HPI.    EP Information / Studies Reviewed:    EKG is ordered today. Personal review as below.  EKG Interpretation Date/Time:  Tuesday November 21 2023 13:58:47 EDT Ventricular Rate:  64 PR Interval:  166 QRS Duration:  170 QT Interval:  488 QTC Calculation: 503 R Axis:   132  Text Interpretation: AV dual-paced rhythm Confirmed by Canary Brim ([id number]) on  11/21/2023 2:28:33 PM   ICD Interrogation-  reviewed in detail today,  See PACEART report.  Device History: Medtronic BiV ICD implanted 06/28/08 with BiV upgrade 07/28/16 for CHB / HFrEF.  Generator Change: 01/24/2023 History of appropriate therapy: Yes History of AAD therapy: Yes; currently on amiodarone     Studies:  ECHO 03/2022 > LVEF 20-25%, LA severely dilated, RA mod dilated     Arrhythmia / AAD VT   Risk Assessment/Calculations:    CHA2DS2-VASc Score = 5   This indicates a 7.2% annual risk of stroke. The patient's score is based upon: CHF History: 1 HTN History: 0 Diabetes History: 0 Stroke History: 2 Vascular Disease History: 0 Age Score: 1 Gender Score: 1             Physical Exam:   VS:  BP 112/78   Pulse 64   Ht 5' 3.25" (1.607 m)   Wt 124 lb 9.6 oz (56.5 kg)   LMP  (LMP Unknown)   SpO2 99%   BMI 21.90 kg/m    Wt Readings from Last 3 Encounters:  11/21/23 124 lb 9.6 oz (56.5 kg)  11/08/23 122 lb 9.6 oz (55.6 kg)  11/02/23 132 lb 3.2 oz (60 kg)     GEN: Well nourished, well developed in no acute distress NECK: No JVD; No carotid bruits CARDIAC: Regular rate and rhythm, no murmurs, rubs, gallops RESPIRATORY:  Clear to auscultation without rales, wheezing or rhonchi  ABDOMEN: Soft, non-tender, non-distended EXTREMITIES:  No edema; No deformity  ASSESSMENT AND PLAN:    Chronic Systolic Dysfunction / NICM s/p Medtronic CRT-D  CHB  VT / NSVT  -euvolemic on exam /OptiVol stable with no evidence of fluid accumulation -Stable on an appropriate medical regimen -Normal ICD function -See Pace Art report -No changes today  Permanent Atrial Fibrillation  Atrial Flutter  CHA2DS2-VASc 5, TSH wnl 04/2023  -continue amiodarone  -update amio labs > CMP, TSH/T4  -has eye exams Q4 months due to glaucoma   Secondary Hypercoagulable State  -continue Eliquis 2.5mg  BID, dose reviewed and appropriate by wt / Cr     Disposition:   Follow up with Dr. Elberta Fortis  in 6 months   Signed, Canary Brim, NP-C, AGACNP-BC [facility] - Electrophysiology  11/21/2023, 6:09 PM

## 2023-11-20 ENCOUNTER — Ambulatory Visit: Attending: Cardiology

## 2023-11-20 ENCOUNTER — Encounter

## 2023-11-20 DIAGNOSIS — I5022 Chronic systolic (congestive) heart failure: Secondary | ICD-10-CM

## 2023-11-20 DIAGNOSIS — Z95810 Presence of automatic (implantable) cardiac defibrillator: Secondary | ICD-10-CM

## 2023-11-21 ENCOUNTER — Encounter: Payer: Self-pay | Admitting: Pulmonary Disease

## 2023-11-21 ENCOUNTER — Ambulatory Visit: Attending: Cardiology | Admitting: Pulmonary Disease

## 2023-11-21 VITALS — BP 112/78 | HR 64 | Ht 63.25 in | Wt 124.6 lb

## 2023-11-21 DIAGNOSIS — Z95810 Presence of automatic (implantable) cardiac defibrillator: Secondary | ICD-10-CM

## 2023-11-21 DIAGNOSIS — N184 Chronic kidney disease, stage 4 (severe): Secondary | ICD-10-CM | POA: Diagnosis not present

## 2023-11-21 DIAGNOSIS — D6869 Other thrombophilia: Secondary | ICD-10-CM

## 2023-11-21 DIAGNOSIS — I428 Other cardiomyopathies: Secondary | ICD-10-CM | POA: Diagnosis not present

## 2023-11-21 DIAGNOSIS — I472 Ventricular tachycardia, unspecified: Secondary | ICD-10-CM

## 2023-11-21 DIAGNOSIS — I5023 Acute on chronic systolic (congestive) heart failure: Secondary | ICD-10-CM

## 2023-11-21 LAB — CUP PACEART INCLINIC DEVICE CHECK
Date Time Interrogation Session: 20250408180923
Implantable Lead Connection Status: 753985
Implantable Lead Connection Status: 753985
Implantable Lead Connection Status: 753985
Implantable Lead Implant Date: 20091104
Implantable Lead Implant Date: 20091104
Implantable Lead Implant Date: 20171214
Implantable Lead Location: 753858
Implantable Lead Location: 753859
Implantable Lead Location: 753860
Implantable Lead Model: 4196
Implantable Lead Model: 5076
Implantable Pulse Generator Implant Date: 20240611

## 2023-11-21 NOTE — Patient Instructions (Signed)
 Medication Instructions:  Your physician recommends that you continue on your current medications as directed. Please refer to the Current Medication list given to you today.  *If you need a refill on your cardiac medications before your next appointment, please call your pharmacy*  Lab Work: CMET, TSH, FreeT4-TODAY If you have labs (blood work) drawn today and your tests are completely normal, you will receive your results only by: MyChart Message (if you have MyChart) OR A paper copy in the mail If you have any lab test that is abnormal or we need to change your treatment, we will call you to review the results.  Follow-Up: At [facility], you and your health needs are our priority.  As part of our continuing mission to provide you with exceptional heart care, our providers are all part of one team.  This team includes your primary Cardiologist (physician) and Advanced Practice Providers or APPs (Physician Assistants and Nurse Practitioners) who all work together to provide you with the care you need, when you need it.  Your next appointment:   6 month(s)  Provider:   Loman Brooklyn, MD or Canary Brim, NP       1st Floor: - Lobby - Registration  - Pharmacy  - Lab - Cafe  2nd Floor: - PV Lab - Diagnostic Testing (echo, CT, nuclear med)  3rd Floor: - Vacant  4th Floor: - TCTS (cardiothoracic surgery) - AFib Clinic - Structural Heart Clinic - Vascular Surgery  - Vascular Ultrasound  5th Floor: - HeartCare Cardiology (general and EP) - Clinical Pharmacy for coumadin, hypertension, lipid, weight-loss medications, and med management appointments    Valet parking services will be available as well.

## 2023-11-22 ENCOUNTER — Other Ambulatory Visit (HOSPITAL_COMMUNITY): Payer: Self-pay | Admitting: Internal Medicine

## 2023-11-22 LAB — COMPREHENSIVE METABOLIC PANEL WITH GFR
ALT: 25 IU/L (ref 0–32)
AST: 19 IU/L (ref 0–40)
Albumin: 4.8 g/dL (ref 3.8–4.8)
Alkaline Phosphatase: 143 IU/L — ABNORMAL HIGH (ref 44–121)
BUN/Creatinine Ratio: 14 (ref 12–28)
BUN: 48 mg/dL — ABNORMAL HIGH (ref 8–27)
Bilirubin Total: 1 mg/dL (ref 0.0–1.2)
CO2: 20 mmol/L (ref 20–29)
Calcium: 10.2 mg/dL (ref 8.7–10.3)
Chloride: 101 mmol/L (ref 96–106)
Creatinine, Ser: 3.43 mg/dL — ABNORMAL HIGH (ref 0.57–1.00)
Globulin, Total: 3.2 g/dL (ref 1.5–4.5)
Glucose: 93 mg/dL (ref 70–99)
Potassium: 4.7 mmol/L (ref 3.5–5.2)
Sodium: 140 mmol/L (ref 134–144)
Total Protein: 8 g/dL (ref 6.0–8.5)
eGFR: 14 mL/min/{1.73_m2} — ABNORMAL LOW (ref >59)

## 2023-11-22 LAB — TSH: TSH: 3.19 u[IU]/mL (ref 0.450–4.500)

## 2023-11-22 LAB — T4, FREE: Free T4: 1.95 ng/dL — ABNORMAL HIGH (ref 0.82–1.77)

## 2023-11-23 ENCOUNTER — Ambulatory Visit (HOSPITAL_COMMUNITY)
Admission: RE | Admit: 2023-11-23 | Discharge: 2023-11-23 | Disposition: A | Discharge status: Home / Self Care | Source: Ambulatory Visit | Attending: Internal Medicine | Admitting: Internal Medicine

## 2023-11-23 DIAGNOSIS — Z006 Encounter for examination for normal comparison and control in clinical research program: Secondary | ICD-10-CM

## 2023-11-23 DIAGNOSIS — I5023 Acute on chronic systolic (congestive) heart failure: Secondary | ICD-10-CM | POA: Diagnosis not present

## 2023-11-23 DIAGNOSIS — I081 Rheumatic disorders of both mitral and tricuspid valves: Secondary | ICD-10-CM | POA: Insufficient documentation

## 2023-11-23 DIAGNOSIS — I5022 Chronic systolic (congestive) heart failure: Secondary | ICD-10-CM | POA: Diagnosis present

## 2023-11-23 DIAGNOSIS — I509 Heart failure, unspecified: Secondary | ICD-10-CM | POA: Diagnosis present

## 2023-11-23 LAB — ECHOCARDIOGRAM COMPLETE
Area-P 1/2: 5.02 cm2
Calc EF: 39.1 %
S' Lateral: 4.7 cm
Single Plane A2C EF: 35.6 %
Single Plane A4C EF: 42.6 %

## 2023-11-23 NOTE — Progress Notes (Signed)
 EPIC Encounter for ICM Monitoring  Patient Name: Kristi Parker is a 73 y.o. female Date: 11/23/2023 Primary Care Physican: Emilio Aspen, MD Primary Cardiologist: [name] Electrophysiologist: Elberta Fortis Bi-V Pacing:  100%    09/06/2022 Weight: 126 lbs 10/14/2022 Weight: 125 lbs 08/11/2023 Weight: 125-126 lbs  11/31/2024 Weight: 126-127 with fluctuation of 1-2 lbs 09/18/2023 Office Weight: 130 lbs  11/08/2023 Office Weight: 122 lbs         Spoke with patient and heart failure questions reviewed.  Transmission results reviewed.  Pt reports being so tired that her husband has to help her stand up.     Optivol thoracic impedance suggesting normal fluid levels.      Prescribed:  Furosemide 40 mg take 1 tablet by mouth daily.  Metolazone 2.5 mg take 1 tablet as needed. As directed by HF clinic Potassium 20 mEq take 1 tablet daily   Labs: 11/21/2023 Creatinine 3.43, BUN 48, Potassium 4.7, Sodium 140, GFR 14 11/14/2023 Creatinine 3.80, BUN 69, Potassium 4.8, Sodium 139, GFR 12  11/08/2023 Creatinine 3.89, BUN 58, Potassium 3.4, Sodium 139, GFR 12  11/02/2023 Creatinine 2.85, BUN 38, Potassium 3.7, Sodium 141, GFR 17  08/25/2023 Creatinine 2.43, BUN 36, Potassium 4.3, Sodium 143, GFR 15 07/20/2023 Creatinine 2.03, BUN 23, Potassium 4.2, Sodium 141, GFR 26, Mg 2.3 06/14/2023 Creatinine 2.29, BUN 33, Potassium 4.1, Sodium 138, GFR 22 12/28/2022 Creatinine 2.18, BUN 34, Potassium 5.1, Sodium 140, GFR 24 A complete set of results can be found in Results Review.   Recommendations:  Any recommendations given at 4/8 OV with Canary Brim, NP.   Follow-up plan: ICM clinic phone appointment on 12/25/2023.  91 day device clinic remote transmission 01/24/2024.    EP/Cardiology Office Visits:  Recall 05/19/2024 with Dr Elberta Fortis (6 month).  11/24/2023 with Dr Gala Romney     Copy of ICM check sent to Dr Elberta Fortis.   3 month ICM trend: 11/20/2023.    12-14 Month ICM trend:     Karie Soda, RN 11/23/2023 3:09 PM

## 2023-11-23 NOTE — Research (Signed)
 SITE: [id number]     Subject # [id number]   Subprotocol: A  Inclusion Criteria  Patients who meet all of the following criteria are eligible for enrollment as study participants:  Yes No  Age > 72 years old X   Eligible to wear Holter Study X    Exclusion Criteria  Patients who meet any of these criteria are not eligible for enrollment as study participants: Yes No  1. Receiving any mechanical (respiratory or circulatory) or renal support therapy at Screening or during Visit #1.  X  2.  Any other conditions that in the opinion of the investigators are likely to prevent compliance with the study protocol or pose a safety concern if the subject participates in the study.  X  3. Poor tolerance, namely susceptible to severe skin allergies from ECG adhesive patch application.  X   Protocol: REV H                                     Residential Zip code [address] (First 3 digits ONLY)                                             [facility] Informed Consent   Subject Name: Kristi Parker  Subject met inclusion and exclusion criteria.  The informed consent form, study requirements and expectations were reviewed with the subject. Subject had opportunity to read consent and questions and concerns were addressed prior to the signing of the consent form.  The subject verbalized understanding of the trial requirements.  The subject agreed to participate in the [facility] EF ACT trial and signed the informed consent at 10:52 on 23-Nov-2023.  The informed consent was obtained prior to performance of any protocol-specific procedures for the subject.  A copy of the signed informed consent was given to the subject and a copy was placed in the subject's medical record.   Dyanne Iha          Current Outpatient Medications:    acetaminophen (TYLENOL) 500 MG tablet, Take 500-1,000 mg by mouth every 6 (six) hours as needed (for back pain). , Disp: , Rfl:    ALPHAGAN P 0.1 % SOLN, Place 1 drop into both eyes 2 (two)  times daily. , Disp: , Rfl:    amiodarone (PACERONE) 200 MG tablet, Take 1 tablet (200 mg total) by mouth 2 (two) times daily., Disp: 180 tablet, Rfl: 3   Ascorbic Acid (VITAMIN C) 500 MG CHEW, Chew 500 mg by mouth daily., Disp: , Rfl:    carvedilol (COREG) 3.125 MG tablet, Take 1 tablet (3.125 mg total) by mouth 2 (two) times daily with a meal., Disp: 180 tablet, Rfl: 3   dapagliflozin propanediol (FARXIGA) 10 MG TABS tablet, Take 1 tablet (10 mg total) by mouth daily., Disp: 90 tablet, Rfl: 3   ELIQUIS 2.5 MG TABS tablet, TAKE 1 TABLET (2.5 MG TOTAL) BY MOUTH 2 (TWO) TIMES DAILY. NEEDS FOLLOW UP APPOINTMENT FOR MORE REFILLS, Disp: 180 tablet, Rfl: 0   ENTRESTO 49-51 MG, TAKE 1 TABLET BY MOUTH TWICE A DAY, Disp: 180 tablet, Rfl: 3   fexofenadine (ALLEGRA) 180 MG tablet, Take 180 mg by mouth daily as needed for allergies. , Disp: , Rfl:    furosemide (LASIX) 40 MG tablet, Take 1 tablet (  40 mg total) by mouth daily., Disp: , Rfl:    levothyroxine (SYNTHROID, LEVOTHROID) 50 MCG tablet, Take 50 mcg by mouth daily before breakfast. , Disp: , Rfl:    loratadine (CLARITIN) 10 MG tablet, Take 10 mg by mouth daily as needed for allergies., Disp: , Rfl:    metolazone (ZAROXOLYN) 2.5 MG tablet, Take 1 tablet (2.5 mg total) by mouth daily as needed. AS DIRECTED BY HEART FAILURE CLINIC, Disp: 5 tablet, Rfl: 0   mexiletine (MEXITIL) 150 MG capsule, Take 1 capsule (150 mg total) by mouth 2 (two) times daily., Disp: 60 capsule, Rfl: 3   potassium chloride SA (KLOR-CON M) 20 MEQ tablet, Take 1 tablet (20 mEq total) by mouth daily., Disp: 30 tablet, Rfl: 3

## 2023-11-24 ENCOUNTER — Encounter: Payer: Self-pay | Admitting: Internal Medicine

## 2023-11-24 ENCOUNTER — Ambulatory Visit: Attending: Internal Medicine | Admitting: Internal Medicine

## 2023-11-24 VITALS — BP 107/71 | HR 63 | Wt 126.0 lb

## 2023-11-24 DIAGNOSIS — G35 Multiple sclerosis: Secondary | ICD-10-CM | POA: Diagnosis not present

## 2023-11-24 DIAGNOSIS — I472 Ventricular tachycardia, unspecified: Secondary | ICD-10-CM | POA: Diagnosis not present

## 2023-11-24 DIAGNOSIS — I428 Other cardiomyopathies: Secondary | ICD-10-CM | POA: Diagnosis not present

## 2023-11-24 DIAGNOSIS — Z79899 Other long term (current) drug therapy: Secondary | ICD-10-CM | POA: Insufficient documentation

## 2023-11-24 DIAGNOSIS — Z8673 Personal history of transient ischemic attack (TIA), and cerebral infarction without residual deficits: Secondary | ICD-10-CM | POA: Diagnosis not present

## 2023-11-24 DIAGNOSIS — I5022 Chronic systolic (congestive) heart failure: Secondary | ICD-10-CM

## 2023-11-24 DIAGNOSIS — R0600 Dyspnea, unspecified: Secondary | ICD-10-CM | POA: Insufficient documentation

## 2023-11-24 DIAGNOSIS — I480 Paroxysmal atrial fibrillation: Secondary | ICD-10-CM

## 2023-11-24 DIAGNOSIS — Z7984 Long term (current) use of oral hypoglycemic drugs: Secondary | ICD-10-CM | POA: Diagnosis not present

## 2023-11-24 DIAGNOSIS — Z7901 Long term (current) use of anticoagulants: Secondary | ICD-10-CM | POA: Diagnosis not present

## 2023-11-24 DIAGNOSIS — N184 Chronic kidney disease, stage 4 (severe): Secondary | ICD-10-CM | POA: Diagnosis not present

## 2023-11-24 DIAGNOSIS — I5023 Acute on chronic systolic (congestive) heart failure: Secondary | ICD-10-CM | POA: Insufficient documentation

## 2023-11-24 DIAGNOSIS — I4821 Permanent atrial fibrillation: Secondary | ICD-10-CM | POA: Insufficient documentation

## 2023-11-24 MED ORDER — AMIODARONE HCL 200 MG PO TABS: 200.0000 mg | ORAL_TABLET | Freq: Every day | ORAL | 3 refills | Status: AC

## 2023-11-24 NOTE — Patient Instructions (Signed)
 Great to see  you today!!!  Medication Changes:  STOP Carvedilol  DECREASE Amiodarone to 200 mg Daily  Special Instructions // Education:  Do the following things EVERYDAY: Weigh yourself in the morning before breakfast. Write it down and keep it in a log. Take your medicines as prescribed Eat low salt foods--Limit salt (sodium) to 2000 mg per day.  Stay as active as you can everyday Limit all fluids for the day to less than 2 liters   Follow-Up in: 6 weeks    If you have any questions or concerns before your next appointment please send [place] a message through [place] or call our office at [phone], If it is after office hours your call will be answered by our answering service and directed appropriately.     At the [facility], you and your health needs are our priority. We have a designated team specialized in the treatment of Heart Failure. This Care Team includes your primary Heart Failure Specialized Cardiologist (physician), Advanced Practice Providers (APPs- Physician Assistants and Nurse Practitioners), and Pharmacist who all work together to provide you with the care you need, when you need it.   You may see any of the following providers on your designated Care Team at your next follow up:  Dr. Arvilla Meres Dr. Marca Ancona Dr. Dorthula Nettles Dr. Theresia Bough Tonye Becket, NP Robbie Lis, [place] [address] [place], [place] Brynda Peon, NP [place] Lee, NP Clarisa Kindred, NP Enos Fling, PharmD

## 2023-11-24 NOTE — Progress Notes (Signed)
 [facility] Note   PCP:  Emilio Aspen, MD  Cardiologist:  Donato Schultz, MD Primary HF: Orpha Dain  Chief Complaint: Heart Failure follow-up   History of Present Illness:  Kristi Parker is a 72 y.o. female with history of VT, severe systolic HF due to NICM (EF 20-25%) s/p Medtronic BiV ICD, CVA 2004, PAF, and CKD III-IV.    She was first diagnosed with HF in 2013. She is unsure of the cause. She had a heart cath and did not have any blockages. We also follow her son in HF clinic for NICM. She states that multiple family members have severe HF and several have underwent transplant.   Based on CPX testing, we referred her to Dr. Edwena Blow at [facility]. She was seen there on 02/22/18 and they discussed need for advanced therapies including possible heart or heart-kidney transplant. She had f/u with them subsequently and felt not to be candidate for heart-kidney transplant but might be candidate for high-risk VAD. We reviewed case with [facility] and she was also turned down for heart/kidney transplant due to age. She has decided she is not interested in VAD.    Had repeat CPX in 10/19 with low Vo2 (13.5) and high slope (42). Repeat RHC done on 10/19 as below as part of transplant work up at [facility] showed elevated volume with CI 2.7  Echo 04/05/22 EF 20-25% Moderate MR   Had ICD generator change-out in 06/24.   Had sustained VT on device 08/24/23, received ATP X 18 followed by shock. Reports she passed out. Device interrogated 03/10. Received 1 ATP on 10/06/23. EP recommended addition of mexiletine, she wanted to defer until follow-up.  Seen last wk for HF f/u. Worked in as urgent visit for fluid retention and increased SOB. ReDs was elevated at 42% and optivol fluid index was trending up. She was instructed to increase Lasix to 40 mg bid and to take a dose of Metolazone 2.5 mg x 1. Pt also agreed to start Mexiletine for VT.   Seen back in [facility] on 11/08/22. Her w/ husband.  Wt down 10 lb from last wt and ReDs improving, now at 37%. She however c/w NYHA Class III symptoms and marked fatigued. Feels more drained this weak. BP is low 92/60. Device interrogation shows Optivol fluid index down and impedence now up above reference. 99% BiV pacing. No further VT. No AF/AF.   Here for f/u with her husband. Remains fatigued. Unable to go to gym. Struggles with ADLs. Has kept fluid off. Dyspnea with mild exertion. No CP, edema, orthopnea or PND  Echo 11/23/23 EF 20-25% RV moderately HK Personally reviewed   Past Medical History:  Diagnosis Date   Adenomatous colon polyp    Cervical radiculopathy at C5    CHF (congestive heart failure) (HCC)    Chronic systolic heart failure (HCC)    CKD (chronic kidney disease), stage III (HCC)    DDD (degenerative disc disease), cervical    DDD (degenerative disc disease), lumbar    Fibroid    GERD (gastroesophageal reflux disease)    Hearing loss 02/2010   L hearing loss/vertigo, steroids   History of seizure disorder    Related Hx   Mitral regurgitation    moderate   Multiple sclerosis (HCC) 1984   Nonischemic cardiomyopathy (HCC)    Moderate LVEF 35-40% by ECHO 2011, 25-30% by echo 2013   Permanent atrial fibrillation (HCC)    Pulmonary hypertension, secondary 06/04/2013  As a result of nonischemic cardiomyopathy EF 25-30%   Stroke [facility])    Right brain CVA, complete recovery 07/2003   Ventricular tachycardia (HCC)    Vertigo 02/2010   L hearing loss/vertigo, steroids   Past Surgical History:  Procedure Laterality Date   BIV ICD GENERATOR CHANGEOUT N/A 01/24/2023   Procedure: BIV ICD GENERATOR CHANGEOUT;  Surgeon: Regan Lemming, MD;  Location: [facility] INVASIVE CV LAB;  Service: Cardiovascular;  Laterality: N/A;   BIV PACEMAKER GENERATOR CHANGE OUT N/A 09/18/2014   Procedure: BIV PACEMAKER GENERATOR CHANGE OUT;  Surgeon: Hillis Range, MD;  Location: [facility] CATH LAB;  Service: Cardiovascular;  Laterality: N/A;   DILATION AND  CURETTAGE OF UTERUS     EP IMPLANTABLE DEVICE N/A 07/28/2016   Procedure: ICD Implant;  Surgeon: Marinus Maw, MD;  Location: [facility] INVASIVE CV LAB;  Service: Cardiovascular;  Laterality: N/A;   HYSTEROSCOPY     PACEMAKER INSERTION     PTVP 08/2001 for complete heart block. Upgrade PTVP to MDT BiV 06/2008 by Dr Amil Amen   RIGHT HEART CATH N/A 05/17/2018   Procedure: RIGHT HEART CATH;  Surgeon: Dolores Patty, MD;  Location: [facility] INVASIVE CV LAB;  Service: Cardiovascular;  Laterality: N/A;   RIGHT/LEFT HEART CATH AND CORONARY ANGIOGRAPHY N/A 02/02/2018   Procedure: RIGHT/LEFT HEART CATH AND CORONARY ANGIOGRAPHY;  Surgeon: Dolores Patty, MD;  Location: [facility] INVASIVE CV LAB;  Service: Cardiovascular;  Laterality: N/A;   TUBAL LIGATION      Current Outpatient Medications  Medication Sig Dispense Refill   acetaminophen (TYLENOL) 500 MG tablet Take 500-1,000 mg by mouth every 6 (six) hours as needed (for back pain).      ALPHAGAN P 0.1 % SOLN Place 1 drop into both eyes 2 (two) times daily.      amiodarone (PACERONE) 200 MG tablet Take 1 tablet (200 mg total) by mouth 2 (two) times daily. 180 tablet 3   Ascorbic Acid (VITAMIN C) 500 MG CHEW Chew 500 mg by mouth daily.     carvedilol (COREG) 3.125 MG tablet Take 1 tablet (3.125 mg total) by mouth 2 (two) times daily with a meal. 180 tablet 3   dapagliflozin propanediol (FARXIGA) 10 MG TABS tablet Take 1 tablet (10 mg total) by mouth daily. 90 tablet 3   ELIQUIS 2.5 MG TABS tablet TAKE 1 TABLET (2.5 MG TOTAL) BY MOUTH 2 (TWO) TIMES DAILY. NEEDS FOLLOW UP APPOINTMENT FOR MORE REFILLS 180 tablet 0   ENTRESTO 49-51 MG TAKE 1 TABLET BY MOUTH TWICE A DAY 180 tablet 3   fexofenadine (ALLEGRA) 180 MG tablet Take 180 mg by mouth daily as needed for allergies.      furosemide (LASIX) 40 MG tablet Take 1 tablet (40 mg total) by mouth daily.     levothyroxine (SYNTHROID, LEVOTHROID) 50 MCG tablet Take 50 mcg by mouth daily before breakfast.      loratadine  (CLARITIN) 10 MG tablet Take 10 mg by mouth daily as needed for allergies.     metolazone (ZAROXOLYN) 2.5 MG tablet Take 1 tablet (2.5 mg total) by mouth daily as needed. AS DIRECTED BY HEART FAILURE CLINIC 5 tablet 0   mexiletine (MEXITIL) 150 MG capsule Take 1 capsule (150 mg total) by mouth 2 (two) times daily. 60 capsule 3   potassium chloride SA (KLOR-CON M) 20 MEQ tablet Take 1 tablet (20 mEq total) by mouth daily. 30 tablet 3   No current facility-administered medications for this visit.    Allergies:  Patient has no known allergies.   Social History:  The patient  reports that she has never smoked. She has never used smokeless tobacco. She reports current alcohol use. She reports that she does not use drugs.   Family History:  The patient's family history includes CAD in an other family member; Cancer in her father; Diabetes in her mother; Multiple sclerosis in her mother.   ROS:  Please see the history of present illness.   All other systems are personally reviewed and negative.   Vitals:   11/24/23 1111  BP: 107/71  Pulse: 63  SpO2: 100%  Weight: 126 lb (57.2 kg)     Wt Readings from Last 3 Encounters:  11/24/23 126 lb (57.2 kg)  11/21/23 124 lb 9.6 oz (56.5 kg)  11/08/23 122 lb 9.6 oz (55.6 kg)   PHYSICAL EXAM: General:  Fatigued appearing. No resp difficulty HEENT: normal Neck: supple. no JVD. Carotids 2+ bilat; no bruits. No lymphadenopathy or thryomegaly appreciated. Cor: Regular rate & rhythm. No rubs, gallops or murmurs. Lungs: clear Abdomen: soft, nontender, nondistended. No hepatosplenomegaly. No bruits or masses. Good bowel sounds. Extremities: no cyanosis, clubbing, rash, edema Neuro: alert & orientedx3, cranial nerves grossly intact. moves all 4 extremities w/o difficulty. Affect pleasant    Recent Labs: 12/28/2022: Hemoglobin 11.1; Platelets 124 08/25/2023: Magnesium 2.5 11/02/2023: B Natriuretic Peptide 368.5 11/08/2023: NT-Pro BNP 2,734 11/21/2023:  ALT 25; BUN 48; Creatinine, Ser 3.43; Potassium 4.7; Sodium 140; TSH 3.190  Personally reviewed    ICD interrogation: Optivol fluid index down and impedence now up above reference. 99% BiV pacing. No further VT. No AF/AF   ASSESSMENT AND PLAN:  1. Acute on chronic Systolic Heart Failure due to NICM. S/p Medtronic BiV ICD. Likely familial CM.  - Echo (9/21): EF 25-30%, severe LV dysfunction, RV ok. - Echo (8/20): EF 25% RV normal Personally reviewed - Echo (12/14/17): EF 20-25%, trivial AI, mild to mod MR, mildly reduced RV, severely dilated RA and LA, PA pressures moderately to severely increased.  - CPX (05/16/18): pVO2 12.1, slope 48 - RHC (05/17/18): NICM with elevated filling pressures and relatively preserved output, suggestive of significant MR - LHC (02/02/18) with normal coronaries. RHC showed severe NICM with well compensated hemodynamics. -Her son has been diagnosed LMNA cardiomyopathy. She saw Dr. Jomarie Longs for genetic counseling. Reports she did not complete genetic testing d/t cost. - CPX (12/27/17): Peak VO2 13.5, slope 42 - Echo 9/21; EF 25-30% - Echo  04/05/22 EF 20-25%  - Echo 11/23/23 EF 20-25% RV moderately HK Personally reviewed - Now NYHA IIIB-IV  - She is end-stage. Previously turned down for heart-kidney transplant at both [facility] and [facility] - with CKD IV not candidate for VAD - Long talk about stage D HF and options including Palliative Care +/- palliative inotropes - They will discuss - Stop carvedilol 3.125 mg bid. - Continue Entresto 49/51 mg bid. - Continue Farxiga 10 mg daily. - Off spiro due to CKD and hyperkalemia  2. PAF - No recent AF cand decrease amio to 200 daily - Continue Eliquis 2.5 mg BID   3. CKD IV - Recent Scr had leveled off ~3.8 (GFR 12) - Sees Dr. Allena Katz next week - Will likely not be HD candidate with severe HF   4. Hx of VT s/p Medtronic BiV ICD - Recurrent VT in 01/25 s/p multiple ATPs/shock and 02/25 treated with ATP - now on Mexiletine +  amiodarone. No further VT detection on device interrogation  - no  driving X 6 months - Decrease amio as above  I spent a total of 45 minutes today: 1) reviewing the patient's medical records including previous charts, labs and recent notes from other providers; 2) examining the patient and counseling them on their medical issues/explaining the plan of care; 3) adjusting meds as needed and 4) ordering lab work or other needed tests.   Arvilla Meres, MD  11/24/2023 11:41 AM

## 2023-11-29 DIAGNOSIS — D631 Anemia in chronic kidney disease: Secondary | ICD-10-CM | POA: Diagnosis not present

## 2023-11-29 DIAGNOSIS — I129 Hypertensive chronic kidney disease with stage 1 through stage 4 chronic kidney disease, or unspecified chronic kidney disease: Secondary | ICD-10-CM | POA: Diagnosis not present

## 2023-11-29 DIAGNOSIS — N184 Chronic kidney disease, stage 4 (severe): Secondary | ICD-10-CM | POA: Diagnosis not present

## 2023-11-29 DIAGNOSIS — N2581 Secondary hyperparathyroidism of renal origin: Secondary | ICD-10-CM | POA: Diagnosis not present

## 2023-12-08 NOTE — Progress Notes (Signed)
 Remote ICD transmission.

## 2023-12-25 ENCOUNTER — Encounter: Payer: Medicare PPO | Admitting: Pulmonary Disease

## 2023-12-25 ENCOUNTER — Ambulatory Visit: Attending: Cardiology

## 2023-12-25 DIAGNOSIS — Z95810 Presence of automatic (implantable) cardiac defibrillator: Secondary | ICD-10-CM

## 2023-12-25 DIAGNOSIS — I5022 Chronic systolic (congestive) heart failure: Secondary | ICD-10-CM | POA: Diagnosis not present

## 2023-12-26 ENCOUNTER — Encounter: Admitting: Pulmonary Disease

## 2023-12-26 NOTE — Progress Notes (Signed)
 EPIC Encounter for ICM Monitoring  Patient Name: Kristi Parker is a 73 y.o. female Date: 12/26/2023 Primary Care Physican: Benedetta Bradley, MD Primary Cardiologist: [name] Electrophysiologist: [name] Bi-V Pacing:  100%    09/06/2022 Weight: 126 lbs 10/14/2022 Weight: 125 lbs 08/11/2023 Weight: 125-126 lbs  11/31/2024 Weight: 126-127 with fluctuation of 1-2 lbs 09/18/2023 Office Weight: 130 lbs  11/08/2023 Office Weight: 122 lbs 12/26/2023 Weight: 118 lbs         Spoke with patient and heart failure questions reviewed.  Transmission results reviewed.  Pt reports her ankles are swollen and weight was up 1 lb yesterday but has returned to normal today.  She missed one Lasix  dosage in the last couple of days.  Her 11 yr old son is hospitalized at [facility] due to same heart condition she has and he also has a defibrillator.     Optivol thoracic impedance suggesting possible fluid accumulation starting 5/6.      Prescribed:  Furosemide  40 mg take 1 tablet by mouth daily.  Metolazone  2.5 mg take 1 tablet as needed. As directed by HF clinic Potassium 20 mEq take 1 tablet daily   Labs: 11/21/2023 Creatinine 3.43, BUN 48, Potassium 4.7, Sodium 140, GFR 14 11/14/2023 Creatinine 3.80, BUN 69, Potassium 4.8, Sodium 139, GFR 12  11/08/2023 Creatinine 3.89, BUN 58, Potassium 3.4, Sodium 139, GFR 12  11/02/2023 Creatinine 2.85, BUN 38, Potassium 3.7, Sodium 141, GFR 17  08/25/2023 Creatinine 2.43, BUN 36, Potassium 4.3, Sodium 143, GFR 15 07/20/2023 Creatinine 2.03, BUN 23, Potassium 4.2, Sodium 141, GFR 26, Mg 2.3 06/14/2023 Creatinine 2.29, BUN 33, Potassium 4.1, Sodium 138, GFR 22 12/28/2022 Creatinine 2.18, BUN 34, Potassium 5.1, Sodium 140, GFR 24 A complete set of results can be found in Results Review.   Recommendations:  Advised to continue prescribed Lasix  40 mg, limit salt and fluid intake.    Copy sent to Dr Bensimhon for review.    Follow-up plan: ICM clinic phone  appointment on 01/02/2024 to recheck fluid levels.  91 day device clinic remote transmission 01/24/2024.    EP/Cardiology Office Visits:  Recall 05/19/2024 with Dr [name] (6 month).  01/15/2024 with Dr [name]     Copy of ICM check sent to Dr [name].   3 month ICM trend: 12/24/2023.    12-14 Month ICM trend:     [name], RN 12/26/2023 4:01 PM

## 2024-01-02 ENCOUNTER — Ambulatory Visit: Attending: Cardiology

## 2024-01-02 DIAGNOSIS — I5022 Chronic systolic (congestive) heart failure: Secondary | ICD-10-CM

## 2024-01-02 DIAGNOSIS — Z95810 Presence of automatic (implantable) cardiac defibrillator: Secondary | ICD-10-CM

## 2024-01-02 NOTE — Progress Notes (Signed)
 EPIC Encounter for ICM Monitoring  Patient Name: Kristi Parker is a 73 y.o. female Date: 01/02/2024 Primary Care Physican: Benedetta Bradley, MD rimary Cardiologist: [name] Electrophysiologist: [name] Bi-V Pacing:  100%    09/06/2022 Weight: 126 lbs 10/14/2022 Weight: 125 lbs 08/11/2023 Weight: 125-126 lbs  11/31/2024 Weight: 126-127 with fluctuation of 1-2 lbs 09/18/2023 Office Weight: 130 lbs  11/08/2023 Office Weight: 122 lbs 12/26/2023 Weight: 118 lbs 01/02/2024 Weight: 120 lbs          Spoke with patient and heart failure questions reviewed.  Transmission results reviewed.  Pt ankle swelling during the day but resolves overnight.  Weight is up 2 lbs from last week but she feels okay.  Son is home from the hospital.     Optivol thoracic impedance suggesting possible fluid accumulation starting 5/6.      Prescribed:  Furosemide  40 mg take 1 tablet by mouth daily.  Metolazone  2.5 mg take 1 tablet as needed. As directed by HF clinic Potassium 20 mEq take 1 tablet daily   Labs: 11/21/2023 Creatinine 3.43, BUN 48, Potassium 4.7, Sodium 140, GFR 14 11/14/2023 Creatinine 3.80, BUN 69, Potassium 4.8, Sodium 139, GFR 12  11/08/2023 Creatinine 3.89, BUN 58, Potassium 3.4, Sodium 139, GFR 12  11/02/2023 Creatinine 2.85, BUN 38, Potassium 3.7, Sodium 141, GFR 17  08/25/2023 Creatinine 2.43, BUN 36, Potassium 4.3, Sodium 143, GFR 15 07/20/2023 Creatinine 2.03, BUN 23, Potassium 4.2, Sodium 141, GFR 26, Mg 2.3 06/14/2023 Creatinine 2.29, BUN 33, Potassium 4.1, Sodium 138, GFR 22 12/28/2022 Creatinine 2.18, BUN 34, Potassium 5.1, Sodium 140, GFR 24 A complete set of results can be found in Results Review.   Recommendations:    Copy sent to Dr Bensimhon for review.    Follow-up plan: ICM clinic phone appointment on 01/09/2024 to recheck fluid levels.  91 day device clinic remote transmission 01/24/2024.    EP/Cardiology Office Visits:  Recall 05/19/2024 with Dr [name] (6 month).   01/15/2024 with Dr [name].     Copy of ICM check sent to Dr [name].    3 month ICM trend: 01/02/2024.    12-14 Month ICM trend:     [name], RN 01/02/2024 7:33 AM

## 2024-01-09 ENCOUNTER — Ambulatory Visit: Attending: Cardiology

## 2024-01-09 DIAGNOSIS — Z95810 Presence of automatic (implantable) cardiac defibrillator: Secondary | ICD-10-CM

## 2024-01-09 DIAGNOSIS — I5022 Chronic systolic (congestive) heart failure: Secondary | ICD-10-CM

## 2024-01-10 NOTE — Progress Notes (Unsigned)
 EPIC Encounter for ICM Monitoring  Patient Name: Kristi Parker is a 73 y.o. female Date: 01/10/2024 Primary Care Physican: Kristi Bradley, MD Primary Cardiologist: Kristi Parker Electrophysiologist: Kristi Parker Bi-V Pacing:  99.9%    09/06/2022 Weight: 126 lbs 10/14/2022 Weight: 125 lbs 08/11/2023 Weight: 125-126 lbs  11/31/2024 Weight: 126-127 with fluctuation of 1-2 lbs 09/18/2023 Office Weight: 130 lbs  11/08/2023 Office Weight: 122 lbs 12/26/2023 Weight: 118 lbs 01/02/2024 Weight: 120 lbs  01/10/2024 Weight:  120.6 lbs             Spoke with patient and heart failure questions reviewed.  Transmission results reviewed.  Pt reports she continues to have  ankle swelling.  Weight still up 2 lbs from 5/13.     Optivol thoracic impedance suggesting ongoing possible fluid accumulation since 5/6.      Prescribed:  Furosemide  40 mg take 1 tablet by mouth daily.  Metolazone  2.5 mg take 1 tablet as needed. As directed by HF clinic Potassium 20 mEq take 1 tablet daily   Labs: 11/21/2023 Creatinine 3.43, BUN 48, Potassium 4.7, Sodium 140, GFR 14 11/14/2023 Creatinine 3.80, BUN 69, Potassium 4.8, Sodium 139, GFR 12  11/08/2023 Creatinine 3.89, BUN 58, Potassium 3.4, Sodium 139, GFR 12  11/02/2023 Creatinine 2.85, BUN 38, Potassium 3.7, Sodium 141, GFR 17  08/25/2023 Creatinine 2.43, BUN 36, Potassium 4.3, Sodium 143, GFR 15 07/20/2023 Creatinine 2.03, BUN 23, Potassium 4.2, Sodium 141, GFR 26, Mg 2.3 06/14/2023 Creatinine 2.29, BUN 33, Potassium 4.1, Sodium 138, GFR 22 12/28/2022 Creatinine 2.18, BUN 34, Potassium 5.1, Sodium 140, GFR 24 A complete set of results can be found in Results Review.   Recommendations:    Copy sent to Kristi Parker for review and recommendations if needed.   Follow-up plan: ICM clinic phone appointment on 01/26/2024 (will be checked at 6/4 OV with Kristi Parker).  91 day device clinic remote transmission 01/24/2024.    EP/Cardiology Office Visits:  Recall  05/19/2024 with Kristi Parker (6 month).  01/17/2024 with Kristi Parker.     Copy of ICM check sent to Kristi Parker.    3 month ICM trend: 01/08/2024.    12-14 Month ICM trend:     Kristi Jain, RN 01/10/2024 2:54 PM

## 2024-01-11 NOTE — Progress Notes (Signed)
  Received: Today Charlette Console, FNP  Shauntia Levengood, Myrtie Atkinson, RN Can you have her take 1 dose of metolazone ? Can you ask if she has seen nephrology yet?  Thanks

## 2024-01-11 NOTE — Progress Notes (Signed)
Attempted call to patient and unable to reach.

## 2024-01-11 NOTE — Progress Notes (Signed)
 Spoke with patient and advised Shawnee Dellen, NP at HF clinic recommended to take Metolazone  2.5 mg x 1 tablet for 1 dose.  She should take the Furosemide  and Potassium as prescribed 30 minutes after she takes the Metolazone .  She has an appointment with the nephrologist in 2 weeks.    She verbalized understanding of the recommendations.

## 2024-01-15 ENCOUNTER — Encounter: Admitting: Internal Medicine

## 2024-01-16 ENCOUNTER — Telehealth: Payer: Self-pay | Admitting: Internal Medicine

## 2024-01-16 DIAGNOSIS — N184 Chronic kidney disease, stage 4 (severe): Secondary | ICD-10-CM | POA: Diagnosis not present

## 2024-01-16 DIAGNOSIS — H401123 Primary open-angle glaucoma, left eye, severe stage: Secondary | ICD-10-CM | POA: Diagnosis not present

## 2024-01-16 DIAGNOSIS — N390 Urinary tract infection, site not specified: Secondary | ICD-10-CM | POA: Diagnosis not present

## 2024-01-16 DIAGNOSIS — H401112 Primary open-angle glaucoma, right eye, moderate stage: Secondary | ICD-10-CM | POA: Diagnosis not present

## 2024-01-16 NOTE — Telephone Encounter (Signed)
 Called to confirm/remind patient of their appointment at the [facility] on 01/16/24 .   Appointment:   [x] Confirmed  [] Left mess   [] No answer/No voice mail  [] VM Full/unable to leave message  [] Phone not in service  Patient reminded to bring all medications and/or complete list.  Confirmed patient has transportation. Gave directions, instructed to utilize valet parking.

## 2024-01-17 ENCOUNTER — Ambulatory Visit: Attending: Internal Medicine | Admitting: Internal Medicine

## 2024-01-17 ENCOUNTER — Encounter: Payer: Self-pay | Admitting: Internal Medicine

## 2024-01-17 VITALS — BP 115/68 | HR 65 | Wt 118.6 lb

## 2024-01-17 DIAGNOSIS — I5022 Chronic systolic (congestive) heart failure: Secondary | ICD-10-CM | POA: Diagnosis not present

## 2024-01-17 DIAGNOSIS — I480 Paroxysmal atrial fibrillation: Secondary | ICD-10-CM | POA: Diagnosis not present

## 2024-01-17 DIAGNOSIS — Z95810 Presence of automatic (implantable) cardiac defibrillator: Secondary | ICD-10-CM

## 2024-01-17 DIAGNOSIS — Z8249 Family history of ischemic heart disease and other diseases of the circulatory system: Secondary | ICD-10-CM | POA: Diagnosis not present

## 2024-01-17 DIAGNOSIS — Z79899 Other long term (current) drug therapy: Secondary | ICD-10-CM | POA: Insufficient documentation

## 2024-01-17 DIAGNOSIS — I428 Other cardiomyopathies: Secondary | ICD-10-CM | POA: Insufficient documentation

## 2024-01-17 DIAGNOSIS — Z8673 Personal history of transient ischemic attack (TIA), and cerebral infarction without residual deficits: Secondary | ICD-10-CM | POA: Diagnosis not present

## 2024-01-17 DIAGNOSIS — I472 Ventricular tachycardia, unspecified: Secondary | ICD-10-CM | POA: Diagnosis not present

## 2024-01-17 DIAGNOSIS — I5023 Acute on chronic systolic (congestive) heart failure: Secondary | ICD-10-CM | POA: Diagnosis not present

## 2024-01-17 DIAGNOSIS — N184 Chronic kidney disease, stage 4 (severe): Secondary | ICD-10-CM

## 2024-01-17 DIAGNOSIS — Z7901 Long term (current) use of anticoagulants: Secondary | ICD-10-CM | POA: Insufficient documentation

## 2024-01-17 NOTE — Progress Notes (Signed)
 [facility] Note   PCP:  Benedetta Bradley, MD  Cardiologist:  Dorothye Gathers, MD Primary HF: [place]  Chief Complaint: Heart Failure follow-up   History of Present Illness:  Kristi Parker is a 73 y.o. female with history of VT, severe systolic HF due to NICM (EF 20-25%) s/p Medtronic BiV ICD, CVA 2004, PAF, and CKD III-IV.    She was first diagnosed with HF in 2013. She is unsure of the cause. She had a heart cath and did not have any blockages. We also follow her son in HF clinic for NICM. She states that multiple family members have severe HF and several have underwent transplant.   Based on CPX testing, we referred her to Dr. Devore at [facility]. She was seen there on 02/22/18 and they discussed need for advanced therapies including possible heart or heart-kidney transplant. She had f/u with them subsequently and felt not to be candidate for heart-kidney transplant but might be candidate for high-risk VAD. We reviewed case with [facility] and she was also turned down for heart/kidney transplant due to age. She has decided she is not interested in VAD.    Had repeat CPX in 10/19 with low Vo2 (13.5) and high slope (42). Repeat RHC done on 10/19 as below as part of [facility] work [facility] at [facility] showed elevated volume with CI 2.7  Echo 04/05/22 EF 20-25% Moderate MR   Had ICD generator change-out in 06/24.   Had sustained VT on device 08/24/23, received ATP X 18 followed by shock. Reports she passed out. Device interrogated 03/10. Received 1 ATP on 10/06/23. EP recommended addition of mexiletine, she wanted to defer until follow-up.  Seen last wk for HF f/u. Worked in as urgent visit for fluid retention and increased SOB. ReDs was elevated at 42% and optivol fluid index was trending up. She was instructed to increase Lasix  to 40 mg bid and to take a dose of Metolazone  2.5 mg x 1. Pt also agreed to start Mexiletine for VT.   Seen back in HF [facility] on 11/08/22. Her w/ husband.  Wt down 10 lb from last wt and ReDs improving, now at 37%. She however c/w NYHA Class III symptoms and marked fatigued. Feels more drained this weak. BP is low 92/60. Device interrogation shows Optivol fluid index down and impedence now up above reference. 99% BiV pacing. No further VT. No AF/AF.   Echo 11/23/23 EF 20-25% RV moderately HK Personally reviewed  I saw her on 11/24/23 was much worse. NYHA IIIb-IV. In setting of volume overload. Took a dose of metolazone  last week. Weight down 8 pounds. Denies edema, orthopnea or PND. Feeling better. Now able to do ADLs.  Had labs with Dr. Lydia Sams yesterday (Renal)   Past Medical History:  Diagnosis Date   Adenomatous colon polyp    Cervical radiculopathy at C5    CHF (congestive heart failure) (HCC)    Chronic systolic heart failure (HCC)    CKD (chronic kidney disease), stage III (HCC)    DDD (degenerative disc disease), cervical    DDD (degenerative disc disease), lumbar    Fibroid    GERD (gastroesophageal reflux disease)    Hearing loss 02/2010   L hearing loss/vertigo, steroids   History of seizure disorder    Related Hx   Mitral regurgitation    moderate   Multiple sclerosis (HCC) 1984   Nonischemic cardiomyopathy (HCC)    Moderate LVEF 35-40% by ECHO 2011, 25-30% by  echo 2013   Permanent atrial fibrillation (HCC)    Pulmonary hypertension, secondary 06/04/2013   As a result of nonischemic cardiomyopathy EF 25-30%   Stroke [facility])    Right brain CVA, complete recovery 07/2003   Ventricular tachycardia (HCC)    Vertigo 02/2010   L hearing loss/vertigo, steroids   Past Surgical History:  Procedure Laterality Date   BIV ICD GENERATOR CHANGEOUT N/A 01/24/2023   Procedure: BIV ICD GENERATOR CHANGEOUT;  Surgeon: Lei Pump, MD;  Location: [facility] INVASIVE CV LAB;  Service: Cardiovascular;  Laterality: N/A;   BIV PACEMAKER GENERATOR CHANGE OUT N/A 09/18/2014   Procedure: BIV PACEMAKER GENERATOR CHANGE OUT;  Surgeon: Jolly Needle, MD;   Location: [facility] CATH LAB;  Service: Cardiovascular;  Laterality: N/A;   DILATION AND CURETTAGE OF UTERUS     EP IMPLANTABLE DEVICE N/A 07/28/2016   Procedure: ICD Implant;  Surgeon: Tammie Fall, MD;  Location: [facility] INVASIVE CV LAB;  Service: Cardiovascular;  Laterality: N/A;   HYSTEROSCOPY     PACEMAKER INSERTION     PTVP 08/2001 for complete heart block. Upgrade PTVP to MDT BiV 06/2008 by Dr Dortha Gauss   RIGHT HEART CATH N/A 05/17/2018   Procedure: RIGHT HEART CATH;  Surgeon: Mardell Shade, MD;  Location: [facility] INVASIVE CV LAB;  Service: Cardiovascular;  Laterality: N/A;   RIGHT/LEFT HEART CATH AND CORONARY ANGIOGRAPHY N/A 02/02/2018   Procedure: RIGHT/LEFT HEART CATH AND CORONARY ANGIOGRAPHY;  Surgeon: Mardell Shade, MD;  Location: [facility] INVASIVE CV LAB;  Service: Cardiovascular;  Laterality: N/A;   TUBAL LIGATION      Current Outpatient Medications  Medication Sig Dispense Refill   acetaminophen  (TYLENOL ) 500 MG tablet Take 500-1,000 mg by mouth every 6 (six) hours as needed (for back pain).      ALPHAGAN P 0.1 % SOLN Place 1 drop into both eyes 2 (two) times daily.      amiodarone  (PACERONE ) 200 MG tablet Take 1 tablet (200 mg total) by mouth daily. 90 tablet 3   Ascorbic Acid (VITAMIN C) 500 MG CHEW Chew 500 mg by mouth daily.     dapagliflozin  propanediol (FARXIGA ) 10 MG TABS tablet Take 1 tablet (10 mg total) by mouth daily. 90 tablet 3   ELIQUIS  2.5 MG TABS tablet TAKE 1 TABLET (2.5 MG TOTAL) BY MOUTH 2 (TWO) TIMES DAILY. NEEDS FOLLOW UP APPOINTMENT FOR MORE REFILLS 180 tablet 0   ENTRESTO  49-51 MG TAKE 1 TABLET BY MOUTH TWICE A DAY 180 tablet 3   fexofenadine (ALLEGRA) 180 MG tablet Take 180 mg by mouth daily as needed for allergies.      furosemide  (LASIX ) 40 MG tablet Take 1 tablet (40 mg total) by mouth daily.     levothyroxine (SYNTHROID, LEVOTHROID) 50 MCG tablet Take 50 mcg by mouth daily before breakfast.      loratadine (CLARITIN) 10 MG tablet Take 10 mg by mouth daily as  needed for allergies.     metolazone  (ZAROXOLYN ) 2.5 MG tablet Take 1 tablet (2.5 mg total) by mouth daily as needed. AS DIRECTED BY HEART FAILURE CLINIC 5 tablet 0   mexiletine (MEXITIL) 150 MG capsule Take 1 capsule (150 mg total) by mouth 2 (two) times daily. 60 capsule 3   potassium chloride  SA (KLOR-CON  M) 20 MEQ tablet Take 1 tablet (20 mEq total) by mouth daily. (Patient not taking: Reported on 01/17/2024) 30 tablet 3   No current facility-administered medications for this visit.    Allergies:   Patient has no  known allergies.   Social History:  The patient  reports that she has never smoked. She has never used smokeless tobacco. She reports current alcohol use. She reports that she does not use drugs.   Family History:  The patient's family history includes CAD in an other family member; Cancer in her father; Diabetes in her mother; Multiple sclerosis in her mother.   ROS:  Please see the history of present illness.   All other systems are personally reviewed and negative.   Vitals:   01/17/24 1130  BP: 115/68  Pulse: 65  SpO2: 100%  Weight: 118 lb 9.6 oz (53.8 kg)      Wt Readings from Last 3 Encounters:  01/17/24 118 lb 9.6 oz (53.8 kg)  11/24/23 126 lb (57.2 kg)  11/21/23 124 lb 9.6 oz (56.5 kg)   PHYSICAL EXAM: General:  Well appearing. No resp difficulty HEENT: normal Neck: supple. no JVD. Carotids 2+ bilat; no bruits. No lymphadenopathy or thryomegaly appreciated. Cor:  Regular rate & rhythm. No rubs, gallops or murmurs. Lungs: clear Abdomen: soft, nontender, nondistended. No hepatosplenomegaly. No bruits or masses. Good bowel sounds. Extremities: no cyanosis, clubbing, rash, edema Neuro: alert & orientedx3, cranial nerves grossly intact. moves all 4 extremities w/o difficulty. Affect pleasant   Recent Labs: 08/25/2023: Magnesium 2.5 11/02/2023: B Natriuretic Peptide 368.5 11/08/2023: NT-Pro BNP 2,734 11/21/2023: ALT 25; BUN 48; Creatinine, Ser 3.43; Potassium  4.7; Sodium 140; TSH 3.190  Personally reviewed    ICD interrogation: Optivol fluid index down and impedence now up above reference. 99% BiV pacing. No further VT. No AF/AF   ASSESSMENT AND PLAN:  1. Acute on chronic Systolic Heart Failure due to NICM. S/p Medtronic BiV ICD. Likely familial CM.  - Echo (9/21): EF 25-30%, severe LV dysfunction, RV ok. - Echo (8/20): EF 25% RV normal Personally reviewed - Echo (12/14/17): EF 20-25%, trivial AI, mild to mod MR, mildly reduced RV, severely dilated RA and LA, PA pressures moderately to severely increased.  - CPX (05/16/18): pVO2 12.1, slope 48 - RHC (05/17/18): NICM with elevated filling pressures and relatively preserved output, suggestive of significant MR - LHC (02/02/18) with normal coronaries. RHC showed severe NICM with well compensated hemodynamics. -Her son has been diagnosed LMNA cardiomyopathy. She saw Dr. Drexel Gentles for genetic counseling. Reports she did not complete genetic testing d/t cost. - CPX (12/27/17): Peak VO2 13.5, slope 42 - Echo 9/21; EF 25-30% - Echo  04/05/22 EF 20-25%  - Echo 11/23/23 EF 20-25% RV moderately HK Personally reviewed - Improved today but remains tenuous. NYHA III-IIIB - Volume status improved from last visit. Can use metolazone  as needed for weight > 120 pounds.  - Continue Entresto  49/51 mg bid. - Continue Farxiga  10 mg daily. - Off spiro due to CKD and hyperkalemia - Off carvedilol  due to low output - ICD interrogated today. Fluid coming back to baseline. No AF or VT. Activity level 1.0hr/day Personally reviewed - She is end-stage. Previously turned down for heart-kidney transplant at both [facility] - with CKD IV not candidate for VAD  2. PAF - In NSR. Continue amio - Continue Eliquis  2.5 mg BID   3. CKD IV - Recent Scr had leveled off ~3.8 (GFR 12) - Following with Dr. Lydia Sams. Had labs yesterday. Await results - Will likely not be HD candidate with severe HF   4. Hx of VT s/p Medtronic BiV ICD -  Recurrent VT in 01/25 s/p multiple ATPs/shock and 02/25 treated with ATP -  now on Mexiletine + amiodarone . No further VT detection on device interrogation  - no driving X 6 months - No VT on ICD today    Jules Oar, MD  01/17/2024 12:02 PM

## 2024-01-17 NOTE — Patient Instructions (Signed)
 Our Doctors' schedules are NOT open yet for 2 months. We will place you on our recall list. Once they are available, we will call you to schedule your follow up appointment.

## 2024-01-23 ENCOUNTER — Other Ambulatory Visit (HOSPITAL_COMMUNITY): Payer: Self-pay | Admitting: Physician Assistant

## 2024-01-24 ENCOUNTER — Ambulatory Visit (INDEPENDENT_AMBULATORY_CARE_PROVIDER_SITE_OTHER): Payer: Medicare PPO

## 2024-01-24 DIAGNOSIS — I428 Other cardiomyopathies: Secondary | ICD-10-CM | POA: Diagnosis not present

## 2024-01-25 ENCOUNTER — Encounter: Payer: Self-pay | Admitting: Internal Medicine

## 2024-01-25 DIAGNOSIS — D631 Anemia in chronic kidney disease: Secondary | ICD-10-CM | POA: Diagnosis not present

## 2024-01-25 DIAGNOSIS — I129 Hypertensive chronic kidney disease with stage 1 through stage 4 chronic kidney disease, or unspecified chronic kidney disease: Secondary | ICD-10-CM | POA: Diagnosis not present

## 2024-01-25 DIAGNOSIS — N184 Chronic kidney disease, stage 4 (severe): Secondary | ICD-10-CM | POA: Diagnosis not present

## 2024-01-25 DIAGNOSIS — N2581 Secondary hyperparathyroidism of renal origin: Secondary | ICD-10-CM | POA: Diagnosis not present

## 2024-01-25 LAB — CUP PACEART REMOTE DEVICE CHECK
Battery Remaining Longevity: 72 mo
Battery Voltage: 2.97 V
Brady Statistic AP VP Percent: 99.91 %
Brady Statistic AP VS Percent: 0.07 %
Brady Statistic AS VP Percent: 0 %
Brady Statistic AS VS Percent: 0.02 %
Brady Statistic RA Percent Paced: 100 %
Brady Statistic RV Percent Paced: 99.91 %
Date Time Interrogation Session: 20250610221607
HighPow Impedance: 41 Ohm
Implantable Lead Connection Status: 753985
Implantable Lead Connection Status: 753985
Implantable Lead Connection Status: 753985
Implantable Lead Implant Date: 20091104
Implantable Lead Implant Date: 20091104
Implantable Lead Implant Date: 20171214
Implantable Lead Location: 753858
Implantable Lead Location: 753859
Implantable Lead Location: 753860
Implantable Lead Model: 4196
Implantable Lead Model: 5076
Implantable Pulse Generator Implant Date: 20240611
Lead Channel Impedance Value: 323 Ohm
Lead Channel Impedance Value: 361 Ohm
Lead Channel Impedance Value: 399 Ohm
Lead Channel Impedance Value: 418 Ohm
Lead Channel Impedance Value: 589 Ohm
Lead Channel Impedance Value: 950 Ohm
Lead Channel Pacing Threshold Amplitude: 0.5 V
Lead Channel Pacing Threshold Amplitude: 2.125 V
Lead Channel Pacing Threshold Pulse Width: 0.4 ms
Lead Channel Pacing Threshold Pulse Width: 1 ms
Lead Channel Sensing Intrinsic Amplitude: 11.4 mV
Lead Channel Setting Pacing Amplitude: 2 V
Lead Channel Setting Pacing Amplitude: 2 V
Lead Channel Setting Pacing Amplitude: 2.5 V
Lead Channel Setting Pacing Pulse Width: 0.4 ms
Lead Channel Setting Pacing Pulse Width: 1 ms
Lead Channel Setting Sensing Sensitivity: 0.3 mV
Zone Setting Status: 755011
Zone Setting Status: 755011

## 2024-01-25 MED ORDER — MEXILETINE HCL 150 MG PO CAPS: 150.0000 mg | ORAL_CAPSULE | Freq: Two times a day (BID) | ORAL | 5 refills | Status: DC

## 2024-01-26 ENCOUNTER — Ambulatory Visit: Attending: Cardiology

## 2024-01-26 DIAGNOSIS — I5022 Chronic systolic (congestive) heart failure: Secondary | ICD-10-CM

## 2024-01-26 DIAGNOSIS — Z95810 Presence of automatic (implantable) cardiac defibrillator: Secondary | ICD-10-CM

## 2024-01-26 NOTE — Progress Notes (Signed)
 EPIC Encounter for ICM Monitoring  Patient Name: Kristi Parker is a 73 y.o. female Date: 01/26/2024 Primary Care Physican: Benedetta Bradley, MD Primary Cardiologist: [name] [facility] HF) Electrophysiologist: [name] Bi-V Pacing:  99.9%    09/06/2022 Weight: 126 lbs 10/14/2022 Weight: 125 lbs 08/11/2023 Weight: 125-126 lbs  11/31/2024 Weight: 126-127 with fluctuation of 1-2 lbs 09/18/2023 Office Weight: 130 lbs  11/08/2023 Office Weight: 122 lbs 12/26/2023 Weight: 118 lbs 01/02/2024 Weight: 120 lbs  01/10/2024 Weight:  120.6 lbs   01/26/2024 Weight: 117 lbs           Spoke with patient and heart failure questions reviewed.  Transmission results reviewed.  Pt asymptomatic for fluid accumulation.  Reports feeling well at this time and voices no complaints.      Optivol thoracic impedance suggesting fluid levels returned to normal after recommendation to take 1 dose of Metolazone .       Prescribed:  Furosemide  40 mg take 1 tablet by mouth daily.  Metolazone  2.5 mg take 1 tablet as needed. As directed by HF clinic.  Per Dr Andria Keeler 01/17/2024 OV note, she can use metolazone  as needed for weight > 120 pounds and 1 potassium.  Potassium 20 mEq take 1 tablet daily   Labs: 11/21/2023 Creatinine 3.43, BUN 48, Potassium 4.7, Sodium 140, GFR 14 11/14/2023 Creatinine 3.80, BUN 69, Potassium 4.8, Sodium 139, GFR 12  11/08/2023 Creatinine 3.89, BUN 58, Potassium 3.4, Sodium 139, GFR 12  11/02/2023 Creatinine 2.85, BUN 38, Potassium 3.7, Sodium 141, GFR 17  08/25/2023 Creatinine 2.43, BUN 36, Potassium 4.3, Sodium 143, GFR 15 07/20/2023 Creatinine 2.03, BUN 23, Potassium 4.2, Sodium 141, GFR 26, Mg 2.3 06/14/2023 Creatinine 2.29, BUN 33, Potassium 4.1, Sodium 138, GFR 22 12/28/2022 Creatinine 2.18, BUN 34, Potassium 5.1, Sodium 140, GFR 24 A complete set of results can be found in Results Review.   Recommendations:  She understands to take Metolazone  if weight gain > 120 lbs.  No changes  and encouraged to call if experiencing any fluid symptoms.   Follow-up plan: ICM clinic phone appointment on 03/11/2024.  91 day device clinic remote transmission 04/24/2024.    EP/Cardiology Office Visits:  Recall 05/19/2024 with Dr [name] (6 month).   OV note 6/4 states to return in about 2 months (around 03/18/2024) but schedule was not open at the time of the visit.  She is on recall list.     Copy of ICM check sent to Dr [name].    3 month ICM trend: 01/26/2024.    12-14 Month ICM trend:     [name], RN 01/26/2024 7:47 AM

## 2024-01-26 NOTE — Addendum Note (Signed)
 Addended by: [name] on: 01/26/2024 07:47 AM   Modules accepted: Level of Service

## 2024-01-28 ENCOUNTER — Other Ambulatory Visit (HOSPITAL_COMMUNITY): Payer: Self-pay | Admitting: Physician Assistant

## 2024-01-30 ENCOUNTER — Ambulatory Visit: Payer: Self-pay | Admitting: Cardiology

## 2024-02-14 ENCOUNTER — Other Ambulatory Visit (HOSPITAL_COMMUNITY): Payer: Self-pay | Admitting: Internal Medicine

## 2024-02-14 ENCOUNTER — Encounter: Payer: Self-pay | Admitting: Internal Medicine

## 2024-02-14 DIAGNOSIS — I5022 Chronic systolic (congestive) heart failure: Secondary | ICD-10-CM

## 2024-02-14 NOTE — Telephone Encounter (Signed)
PT AWARE AND VOICED UNDERSTANDING  

## 2024-02-14 NOTE — Telephone Encounter (Signed)
 Sounds like she may be starting to accumulate fluid again. Okay to take a dose of metolazone  with a potassium pill. Please have her send a remote device transmission.

## 2024-02-15 NOTE — Addendum Note (Signed)
 Addended by: Stace Peace A on: 02/15/2024 04:58 PM   Modules accepted: Orders

## 2024-02-15 NOTE — Telephone Encounter (Signed)
 SABRA

## 2024-02-26 NOTE — Progress Notes (Signed)
 ICM remote transmission rescheduled for 02/28/2024.

## 2024-02-28 ENCOUNTER — Ambulatory Visit: Attending: Cardiology

## 2024-02-28 DIAGNOSIS — I5022 Chronic systolic (congestive) heart failure: Secondary | ICD-10-CM

## 2024-02-28 DIAGNOSIS — Z95810 Presence of automatic (implantable) cardiac defibrillator: Secondary | ICD-10-CM

## 2024-02-28 NOTE — Progress Notes (Signed)
 EPIC Encounter for ICM Monitoring  Patient Name: Kristi Parker is a 73 y.o. female Date: 02/28/2024 Primary Care Physican: Charlott Dorn LABOR, MD Primary Cardiologist: [name] [facility] HF) Electrophysiologist: [name] Bi-V Pacing:  99.8%    09/06/2022 Weight: 126 lbs 10/14/2022 Weight: 125 lbs 08/11/2023 Weight: 125-126 lbs  11/31/2024 Weight: 126-127 with fluctuation of 1-2 lbs 09/18/2023 Office Weight: 130 lbs  11/08/2023 Office Weight: 122 lbs 12/26/2023 Weight: 118 lbs 01/02/2024 Weight: 120 lbs  01/10/2024 Weight:  120.6 lbs   01/26/2024 Weight: 117 lbs   02/28/2024 Weight: 117.8 lbs (lowest weight 115 lbs after 7/2 Metolazone )         Spoke with patient and heart failure questions reviewed.  Transmission results reviewed.  Pt reports ankles are swollen left is worse than right.  Pt is going out of town tomorrow, 7/17 and return on 7/20.  She stated she may be able to take Lasix  daily due to bathroom may not be readily accessible.   Pt did not read the 7/3 my chart message providing recommendation that she take a 2nd day of Metolazone  and to get BMET drawn.     Optivol thoracic impedance suggesting possible fluid accumulation starting 6/14 (last dose of Metolazone  7/2).    Fluid index greater than normal threshold starting 5/25.   Prescribed:  Furosemide  40 mg take 1 tablet by mouth daily.  Metolazone  2.5 mg take 1 tablet as needed. As directed by HF clinic.  Per Dr Nelle 01/17/2024 OV note, she can use metolazone  as needed for weight > 120 pounds and 1 potassium.  Potassium 20 mEq take 1 tablet daily   Labs: 02/22/2024 BMET Scheduled but she did not read my chart msg to get labs drawn 11/21/2023 Creatinine 3.43, BUN 48, Potassium 4.7, Sodium 140, GFR 14 11/14/2023 Creatinine 3.80, BUN 69, Potassium 4.8, Sodium 139, GFR 12  11/08/2023 Creatinine 3.89, BUN 58, Potassium 3.4, Sodium 139, GFR 12  11/02/2023 Creatinine 2.85, BUN 38, Potassium 3.7, Sodium 141, GFR 17   08/25/2023 Creatinine 2.43, BUN 36, Potassium 4.3, Sodium 143, GFR 15 07/20/2023 Creatinine 2.03, BUN 23, Potassium 4.2, Sodium 141, GFR 26, Mg 2.3 06/14/2023 Creatinine 2.29, BUN 33, Potassium 4.1, Sodium 138, GFR 22 12/28/2022 Creatinine 2.18, BUN 34, Potassium 5.1, Sodium 140, GFR 24 A complete set of results can be found in Results Review.   Recommendations:  Advised to limit salt intake and take Lasix  while on vacation if possible.  Will recheck remote transmission on 7/21   Follow-up plan: ICM clinic phone appointment on 03/04/2024 to recheck fluid levels.  91 day device clinic remote transmission 04/24/2024.    EP/Cardiology Office Visits:  Recall 05/19/2024 with Dr Inocencio (6 month).   Dr Bensimhon's OV note 6/4 states to return in about 2 months (around 03/18/2024) but schedule was not open at the time of the visit.      Copy of ICM check sent to Dr Inocencio.    3 month ICM trend: 02/27/2024.    12-14 Month ICM trend:     [name], RN 02/28/2024 7:20 AM

## 2024-03-04 ENCOUNTER — Ambulatory Visit: Attending: Cardiology

## 2024-03-04 ENCOUNTER — Encounter: Payer: Self-pay | Admitting: Internal Medicine

## 2024-03-04 DIAGNOSIS — I5022 Chronic systolic (congestive) heart failure: Secondary | ICD-10-CM

## 2024-03-04 DIAGNOSIS — Z95810 Presence of automatic (implantable) cardiac defibrillator: Secondary | ICD-10-CM

## 2024-03-04 NOTE — Telephone Encounter (Signed)
 Per [name]. Patient will come in for lab work tomorrow. Patient will get further instructions after lab work is resulted.

## 2024-03-05 ENCOUNTER — Ambulatory Visit (HOSPITAL_COMMUNITY)
Admission: RE | Admit: 2024-03-05 | Discharge: 2024-03-05 | Disposition: A | Discharge status: Home / Self Care | Source: Ambulatory Visit | Attending: Cardiology | Admitting: Cardiology

## 2024-03-05 DIAGNOSIS — I5022 Chronic systolic (congestive) heart failure: Secondary | ICD-10-CM | POA: Diagnosis not present

## 2024-03-05 LAB — BASIC METABOLIC PANEL WITH GFR
Anion gap: 11 (ref 5–15)
BUN: 31 mg/dL — ABNORMAL HIGH (ref 8–23)
CO2: 24 mmol/L (ref 22–32)
Calcium: 9.8 mg/dL (ref 8.9–10.3)
Chloride: 109 mmol/L (ref 98–111)
Creatinine, Ser: 2.38 mg/dL — ABNORMAL HIGH (ref 0.44–1.00)
GFR, Estimated: 21 mL/min — ABNORMAL LOW (ref >60)
Glucose, Bld: 79 mg/dL (ref 70–99)
Potassium: 3.7 mmol/L (ref 3.5–5.1)
Sodium: 144 mmol/L (ref 135–145)

## 2024-03-05 LAB — BRAIN NATRIURETIC PEPTIDE: B Natriuretic Peptide: 712.7 pg/mL — ABNORMAL HIGH (ref 0.0–100.0)

## 2024-03-06 NOTE — Progress Notes (Signed)
 EPIC Encounter for ICM Monitoring  Patient Name: Kristi Parker is a 73 y.o. female Date: 03/06/2024 Primary Care Physican: Charlott Dorn LABOR, MD Primary Cardiologist: [name] [facility] HF) Electrophysiologist: [name] Bi-V Pacing:  99.8%    09/06/2022 Weight: 126 lbs 10/14/2022 Weight: 125 lbs 08/11/2023 Weight: 125-126 lbs  11/31/2024 Weight: 126-127 with fluctuation of 1-2 lbs 09/18/2023 Office Weight: 130 lbs  11/08/2023 Office Weight: 122 lbs 12/26/2023 Weight: 118 lbs 01/02/2024 Weight: 120 lbs  01/10/2024 Weight:  120.6 lbs   01/26/2024 Weight: 117 lbs   02/28/2024 Weight: 117.8 lbs (lowest weight 115 lbs after 7/2 Metolazone )         Pt contacted HF clinic 7/21 by mychart for recommendation regarding ongoing fluid accumulation.    Optivol thoracic impedance suggesting possible fluid accumulation starting 6/14 (last dose of Metolazone  7/2).    Fluid index greater than normal threshold starting 5/25.   Prescribed:  Furosemide  40 mg take 1 tablet by mouth daily.  Metolazone  2.5 mg take 1 tablet as needed. As directed by HF clinic.  Per Dr Nelle 01/17/2024 OV note, she can use metolazone  as needed for weight > 120 pounds and 1 potassium.  Potassium 20 mEq take 1 tablet daily   Labs: 03/05/2024 Creatinine 2.38, BUN 31, Potassium 3.7, Sodium 144, GFR 21, BNP 712.7 11/21/2023 Creatinine 3.43, BUN 48, Potassium 4.7, Sodium 140, GFR 14 11/14/2023 Creatinine 3.80, BUN 69, Potassium 4.8, Sodium 139, GFR 12  11/08/2023 Creatinine 3.89, BUN 58, Potassium 3.4, Sodium 139, GFR 12  11/02/2023 Creatinine 2.85, BUN 38, Potassium 3.7, Sodium 141, GFR 17, BNP 368.5 A complete set of results can be found in Results Review.   Recommendations:  Recommendation to take Metolazone  2.5 mg with 40 KCL per Harlene Gainer, NP at [facility] clinic per Mychart message.    Follow-up plan: ICM clinic phone appointment on 03/12/2024 to recheck fluid levels.  91 day device clinic remote transmission  04/24/2024.    EP/Cardiology Office Visits:  Recall 05/19/2024 with Dr Inocencio (6 month).   Dr Bensimhon's OV note 6/4 states to return in about 2 months (around 03/18/2024) but schedule was not open at the time of the visit.      Copy of ICM check sent to Dr Inocencio.    3 month ICM trend: 03/04/2024.    12-14 Month ICM trend:     [name], RN 03/06/2024 9:36 AM

## 2024-03-11 ENCOUNTER — Encounter

## 2024-03-12 ENCOUNTER — Ambulatory Visit: Attending: Cardiology

## 2024-03-12 ENCOUNTER — Telehealth: Payer: Self-pay

## 2024-03-12 DIAGNOSIS — I5022 Chronic systolic (congestive) heart failure: Secondary | ICD-10-CM

## 2024-03-12 DIAGNOSIS — Z95810 Presence of automatic (implantable) cardiac defibrillator: Secondary | ICD-10-CM

## 2024-03-12 NOTE — Telephone Encounter (Signed)
-----   Message from Nurse Mitzie RAMAN sent at 03/12/2024  8:17 AM EDT ----- Regarding: ATP Good morning,  7/29 Carelink report shows ATP on 7/21.  Would you review and follow up please?  Thanks Mitzie

## 2024-03-12 NOTE — Telephone Encounter (Signed)
 Remote transmission reviewed. Normal device function. 1 VT event occurred 03/04/24 @ 07:17 w/ duration of 26 seconds. 2 ATP delivered with clean break.   Patient called and reports still swelling in lower extremities. States she is currently on vacation and will return 03/17/24.   Advised no driving X6 months per  DMV with start date of 03/04/24 & shock plan reviewed w/ verbal understanding.   Routing to EP MD/HF team to advise if any further action is needed.

## 2024-03-12 NOTE — Telephone Encounter (Signed)
 Pt aware and voiced understanding Will take her PRN metolazone  and Kcl Follow up 8/5 @ 11

## 2024-03-13 NOTE — Progress Notes (Signed)
 [facility] Clinic Note   PCP:  Charlott Dorn LABOR, MD  Nephrology: Dr. Tobie Cardiologist:  Oneil Parchment, MD HF Cardiology: Bensimhon   HPI:  Kristi Parker is a 73 y.o. female with history of VT, severe systolic HF due to NICM (EF 20-25%) s/p Medtronic BiV ICD, CVA 2004, PAF, and CKD III-IV.    She was first diagnosed with HF in 2013. She is unsure of the cause. She had a heart cath and did not have any blockages. We also follow her son in HF clinic for NICM. She states that multiple family members have severe HF and several have underwent transplant.   Based on CPX testing, we referred her to Dr. Devore at [facility]. She was seen there on 02/22/18 and they discussed need for advanced therapies including possible heart or heart-kidney transplant. She had f/u with them subsequently and felt not to be candidate for heart-kidney transplant but might be candidate for high-risk VAD. We reviewed case with [facility] and she was also turned down for heart/kidney transplant due to age. She has decided she is not interested in VAD.    Had repeat CPX in 10/19 with low Vo2 (13.5) and high slope (42). Repeat RHC done on 10/19 as below as part of transplant work up at [facility] showed elevated volume with CI 2.7  Echo 04/05/22 EF 20-25% Moderate MR   Had ICD generator change-out in 6/24.   Had sustained VT on device 08/24/23, received ATP X 18 followed by shock. Reports she passed out. Device interrogated 03/10. Received 1 ATP on 10/06/23. EP recommended addition of mexiletine, she wanted to defer until follow-up.  Echo 11/23/23 EF 20-25% RV moderately HK   Had VT on 03/04/24, received 2 ATPs with break in rhythm. OptiVol elevated and our clinic instructed her to take metolazone /20 KCL x 1 dose.  Today she returns for HF follow up with her husband. Had recent VT and received ATP, was on vacation. She felt fatigued but no associated symptoms with her arrhythmia. She remains fatigued, and has SOB  walking further distances on flat ground. Has LE swelling. Denies palpitations, abnormal bleeding, CP, dizziness, or PND/Orthopnea. Appetite ok. Weight at home 115 pounds. Taking all medications.   Past Medical History:  Diagnosis Date   Adenomatous colon polyp    Cervical radiculopathy at C5    CHF (congestive heart failure) (HCC)    Chronic systolic heart failure (HCC)    CKD (chronic kidney disease), stage III (HCC)    DDD (degenerative disc disease), cervical    DDD (degenerative disc disease), lumbar    Fibroid    GERD (gastroesophageal reflux disease)    Hearing loss 02/2010   L hearing loss/vertigo, steroids   History of seizure disorder    Related Hx   Mitral regurgitation    moderate   Multiple sclerosis (HCC) 1984   Nonischemic cardiomyopathy (HCC)    Moderate LVEF 35-40% by ECHO 2011, 25-30% by echo 2013   Permanent atrial fibrillation (HCC)    Pulmonary hypertension, secondary 06/04/2013   As a result of nonischemic cardiomyopathy EF 25-30%   Stroke [facility])    Right brain CVA, complete recovery 07/2003   Ventricular tachycardia (HCC)    Vertigo 02/2010   L hearing loss/vertigo, steroids   Past Surgical History:  Procedure Laterality Date   BIV ICD GENERATOR CHANGEOUT N/A 01/24/2023   Procedure: BIV ICD GENERATOR CHANGEOUT;  Surgeon: Inocencio Soyla Lunger, MD;  Location: [facility] INVASIVE CV LAB;  Service: Cardiovascular;  Laterality: N/A;   BIV PACEMAKER GENERATOR CHANGE OUT N/A 09/18/2014   Procedure: BIV PACEMAKER GENERATOR CHANGE OUT;  Surgeon: Lynwood Rakers, MD;  Location: [facility] CATH LAB;  Service: Cardiovascular;  Laterality: N/A;   DILATION AND CURETTAGE OF UTERUS     EP IMPLANTABLE DEVICE N/A 07/28/2016   Procedure: ICD Implant;  Surgeon: Danelle LELON Birmingham, MD;  Location: [facility] INVASIVE CV LAB;  Service: Cardiovascular;  Laterality: N/A;   HYSTEROSCOPY     PACEMAKER INSERTION     PTVP 08/2001 for complete heart block. Upgrade PTVP to MDT BiV 06/2008 by Dr Malva   RIGHT HEART  CATH N/A 05/17/2018   Procedure: RIGHT HEART CATH;  Surgeon: Cherrie Toribio SAUNDERS, MD;  Location: [facility] INVASIVE CV LAB;  Service: Cardiovascular;  Laterality: N/A;   RIGHT/LEFT HEART CATH AND CORONARY ANGIOGRAPHY N/A 02/02/2018   Procedure: RIGHT/LEFT HEART CATH AND CORONARY ANGIOGRAPHY;  Surgeon: Cherrie Toribio SAUNDERS, MD;  Location: [facility] INVASIVE CV LAB;  Service: Cardiovascular;  Laterality: N/A;   TUBAL LIGATION     Current Outpatient Medications  Medication Sig Dispense Refill   acetaminophen  (TYLENOL ) 500 MG tablet Take 500-1,000 mg by mouth every 6 (six) hours as needed (for back pain).      ALPHAGAN P 0.1 % SOLN Place 1 drop into both eyes 2 (two) times daily.      amiodarone  (PACERONE ) 200 MG tablet Take 1 tablet (200 mg total) by mouth daily. 90 tablet 3   apixaban  (ELIQUIS ) 2.5 MG TABS tablet Take 1 tablet (2.5 mg total) by mouth 2 (two) times daily. 180 tablet 3   Ascorbic Acid (VITAMIN C) 500 MG CHEW Chew 500 mg by mouth daily.     dapagliflozin  propanediol (FARXIGA ) 10 MG TABS tablet Take 1 tablet (10 mg total) by mouth daily. 90 tablet 3   fexofenadine (ALLEGRA) 180 MG tablet Take 180 mg by mouth daily as needed for allergies.      furosemide  (LASIX ) 40 MG tablet Take 1 tablet (40 mg total) by mouth daily. 90 tablet 3   levothyroxine (SYNTHROID, LEVOTHROID) 50 MCG tablet Take 50 mcg by mouth daily before breakfast.      loratadine (CLARITIN) 10 MG tablet Take 10 mg by mouth daily as needed for allergies.     metolazone  (ZAROXOLYN ) 2.5 MG tablet Take 1 tablet (2.5 mg total) by mouth daily as needed. AS DIRECTED BY HEART FAILURE CLINIC 5 tablet 0   mexiletine (MEXITIL) 150 MG capsule Take 1 capsule (150 mg total) by mouth 2 (two) times daily. 60 capsule 5   potassium chloride  SA (KLOR-CON  M) 20 MEQ tablet TAKE 1 TABLET BY MOUTH EVERY DAY (Patient taking differently: Take 20 mEq by mouth daily. As instructed by provider) 90 tablet 3   sacubitril -valsartan  (ENTRESTO ) 49-51 MG Take 1 tablet by  mouth 2 (two) times daily. 180 tablet 3   No current facility-administered medications for this encounter.   Allergies:   Patient has no known allergies.   Social History:  The patient  reports that she has never smoked. She has never used smokeless tobacco. She reports current alcohol use. She reports that she does not use drugs.   Family History:  The patient's family history includes CAD in an other family member; Cancer in her father; Diabetes in her mother; Multiple sclerosis in her mother.   ROS:  Please see the history of present illness.   All other systems are personally reviewed and negative.   Wt Readings from Last  3 Encounters:  03/19/24 49.7 kg (109 lb 9.6 oz)  01/17/24 53.8 kg (118 lb 9.6 oz)  11/24/23 57.2 kg (126 lb)   BP 118/62   Pulse 68   Ht 5' 3.25 (1.607 m)   Wt 49.7 kg (109 lb 9.6 oz)   LMP  (LMP Unknown)   SpO2 99%   BMI 19.26 kg/m   PHYSICAL EXAM: General:  NAD. No resp difficulty, walked into clinic HEENT: Normal Neck: Supple. No JVD. Cor: Regular rate & rhythm. No rubs, gallops or murmurs. Lungs: Clear Abdomen: Soft, nontender, nondistended.  Extremities: No cyanosis, clubbing, rash, 1+ BLE edema Neuro: Alert & oriented x 3, moves all 4 extremities w/o difficulty. Affect pleasant.  Device interrogation (personally reviewed): OptiVol up but now down trending, no AT/AF, 1 hr/day activity, no further VT/VF since 03/12/24  ReDs reading: 35%, normal  ECG (personally reviewed): AV paced 63 bpm  ASSESSMENT AND PLAN: 1. Chronic Systolic Heart Failure due to NICM.  - S/p Medtronic BiV ICD. Likely familial CM.  - Echo (9/21): EF 25-30%, severe LV dysfunction, RV ok. - Echo (8/20): EF 25% RV normal Personally reviewed - Echo (12/14/17): EF 20-25%, trivial AI, mild to mod MR, mildly reduced RV, severely dilated RA and LA, PA pressures moderately to severely increased.  - CPX (05/16/18): pVO2 12.1, slope 48 - RHC (05/17/18): NICM with elevated filling  pressures and relatively preserved output, suggestive of significant MR - LHC (02/02/18) with normal coronaries. RHC showed severe NICM with well compensated hemodynamics. - Her son has been diagnosed LMNA cardiomyopathy. She saw Dr. Fairy for genetic counseling. Reports she did not complete genetic testing d/t cost. - CPX (12/27/17): Peak VO2 13.5, slope 42 - Echo 9/21: EF 25-30% - Echo  04/05/22: EF 20-25%  - Echo 11/23/23: EF 20-25% RV moderately HK - Tenuous NYHA III-IIIb, volume recently up on device but now trending back to baseline after metolazone  use, ReDs 35%. - Can use metolazone  2.5 mg/40 KCL PRN for weight > 120 pounds.  - Increase Lasix  to 60 mg daily - Continue Entresto  49/51 mg bid. - Continue Farxiga  10 mg daily. - Off spiro due to CKD and hyperkalemia. - Off carvedilol  due to low output - She is end-stage. Previously turned down for heart-kidney transplant at both [facility] - with CKD IV not candidate for VAD - Labs today, repeat BMET in 10-14 days.  2. PAF - In NSR.  - Continue amio 200 mg daily. - Continue Eliquis  2.5 mg bid. - Amio labs UTD   3. CKD IV - Last SCr 2.38 - Continue Farxiga . - Following with Dr. Tobie.  - Will likely not be HD candidate with severe HF.   4. Hx of VT  - s/p Medtronic BiV ICD - Recurrent VT in 1/25 s/p multiple ATPs/shock and 02/25 treated with ATP - Continue amiodarone  200 mg daily - Continue Mexiletine 150 mg bid - Device interrogation as above. - no driving X 6 months from 2/78/74. - Has EP follow up later this month - Labs today  Follow up in 2-3 months with Dr. Bensimhon  Joselynne Killam M Quinzell Malcomb, FNP  03/19/2024 11:26 AM

## 2024-03-13 NOTE — Progress Notes (Signed)
 EPIC Encounter for ICM Monitoring  Patient Name: CAMMIE FAULSTICH is a 73 y.o. female Date: 03/13/2024 Primary Care Physican: Charlott Dorn LABOR, MD Primary Cardiologist: [name] [facility] HF) Electrophysiologist: [name] Bi-V Pacing:  99.8%    09/06/2022 Weight: 126 lbs 01/02/2024 Weight: 120 lbs  01/10/2024 Weight:  120.6 lbs   01/26/2024 Weight: 117 lbs   02/28/2024 Weight: 117.8 lbs (lowest weight 115 lbs after 7/2 Metolazone )  VT/VF Pace-Terminated Episodes 1 of 1, ATP on 03/04/2024          Transmission results reviewed.     Optivol thoracic impedance suggesting fluid levels returned close to normal after taking recommended doses of metolazone  and KCL.  ATP addressed by device clinic RN on 7/29   Prescribed:  Furosemide  40 mg take 1 tablet by mouth daily.  Metolazone  2.5 mg take 1 tablet as needed. As directed by HF clinic.  Per Dr Nelle 01/17/2024 OV note, she can use metolazone  as needed for weight > 120 pounds and 1 potassium.  Potassium 20 mEq take 1 tablet daily   Labs: 03/05/2024 Creatinine 2.38, BUN 31, Potassium 3.7, Sodium 144, GFR 21, BNP 712.7 11/21/2023 Creatinine 3.43, BUN 48, Potassium 4.7, Sodium 140, GFR 14 11/14/2023 Creatinine 3.80, BUN 69, Potassium 4.8, Sodium 139, GFR 12  11/08/2023 Creatinine 3.89, BUN 58, Potassium 3.4, Sodium 139, GFR 12  11/02/2023 Creatinine 2.85, BUN 38, Potassium 3.7, Sodium 141, GFR 17, BNP 368.5 A complete set of results can be found in Results Review.   Recommendations:  Recommendation to take Metolazone  2.5 mg with 40 KCL per Harlene Gainer, NP per 7/29 phone note.    Follow-up plan: ICM clinic phone appointment on 04/01/2024.  91 day device clinic remote transmission 04/24/2024.    EP/Cardiology Office Visits:    03/19/2024 with [name], NP.   Recall 05/19/2024 with Dr Inocencio (6 month).   Dr Bensimhon's OV note 6/4 states to return in about 2 months (around 03/18/2024) but schedule was not open at the time of the  visit.      Copy of ICM check sent to Dr Inocencio.    3 month ICM trend: 03/12/2024.    12-14 Month ICM trend:     [name], RN 03/13/2024 1:12 PM

## 2024-03-19 ENCOUNTER — Encounter (HOSPITAL_COMMUNITY): Payer: Self-pay

## 2024-03-19 ENCOUNTER — Ambulatory Visit (HOSPITAL_COMMUNITY)
Admission: RE | Admit: 2024-03-19 | Discharge: 2024-03-19 | Disposition: A | Discharge status: Home / Self Care | Source: Ambulatory Visit | Attending: Family Medicine | Admitting: Family Medicine

## 2024-03-19 ENCOUNTER — Ambulatory Visit (HOSPITAL_COMMUNITY): Payer: Self-pay | Admitting: Family Medicine

## 2024-03-19 VITALS — BP 118/62 | HR 68 | Ht 63.25 in | Wt 109.6 lb

## 2024-03-19 DIAGNOSIS — I480 Paroxysmal atrial fibrillation: Secondary | ICD-10-CM | POA: Insufficient documentation

## 2024-03-19 DIAGNOSIS — I340 Nonrheumatic mitral (valve) insufficiency: Secondary | ICD-10-CM | POA: Diagnosis not present

## 2024-03-19 DIAGNOSIS — I5022 Chronic systolic (congestive) heart failure: Secondary | ICD-10-CM

## 2024-03-19 DIAGNOSIS — Z95810 Presence of automatic (implantable) cardiac defibrillator: Secondary | ICD-10-CM | POA: Diagnosis not present

## 2024-03-19 DIAGNOSIS — I428 Other cardiomyopathies: Secondary | ICD-10-CM | POA: Insufficient documentation

## 2024-03-19 DIAGNOSIS — N184 Chronic kidney disease, stage 4 (severe): Secondary | ICD-10-CM | POA: Insufficient documentation

## 2024-03-19 DIAGNOSIS — Z7901 Long term (current) use of anticoagulants: Secondary | ICD-10-CM | POA: Insufficient documentation

## 2024-03-19 DIAGNOSIS — Z79899 Other long term (current) drug therapy: Secondary | ICD-10-CM | POA: Insufficient documentation

## 2024-03-19 DIAGNOSIS — I472 Ventricular tachycardia, unspecified: Secondary | ICD-10-CM | POA: Insufficient documentation

## 2024-03-19 DIAGNOSIS — Z8673 Personal history of transient ischemic attack (TIA), and cerebral infarction without residual deficits: Secondary | ICD-10-CM | POA: Diagnosis not present

## 2024-03-19 DIAGNOSIS — R5383 Other fatigue: Secondary | ICD-10-CM | POA: Insufficient documentation

## 2024-03-19 DIAGNOSIS — I130 Hypertensive heart and chronic kidney disease with heart failure and stage 1 through stage 4 chronic kidney disease, or unspecified chronic kidney disease: Secondary | ICD-10-CM | POA: Diagnosis not present

## 2024-03-19 LAB — MAGNESIUM: Magnesium: 2.2 mg/dL (ref 1.7–2.4)

## 2024-03-19 LAB — BASIC METABOLIC PANEL WITH GFR
Anion gap: 11 (ref 5–15)
BUN: 30 mg/dL — ABNORMAL HIGH (ref 8–23)
CO2: 25 mmol/L (ref 22–32)
Calcium: 9.8 mg/dL (ref 8.9–10.3)
Chloride: 105 mmol/L (ref 98–111)
Creatinine, Ser: 2.53 mg/dL — ABNORMAL HIGH (ref 0.44–1.00)
GFR, Estimated: 20 mL/min — ABNORMAL LOW (ref >60)
Glucose, Bld: 97 mg/dL (ref 70–99)
Potassium: 3.2 mmol/L — ABNORMAL LOW (ref 3.5–5.1)
Sodium: 141 mmol/L (ref 135–145)

## 2024-03-19 LAB — BRAIN NATRIURETIC PEPTIDE: B Natriuretic Peptide: 386.6 pg/mL — ABNORMAL HIGH (ref 0.0–100.0)

## 2024-03-19 MED ORDER — FUROSEMIDE 40 MG PO TABS: 60.0000 mg | ORAL_TABLET | Freq: Every day | ORAL | 3 refills | Status: AC

## 2024-03-19 NOTE — Progress Notes (Signed)
 ReDS Vest / Clip - 03/19/24 1116       ReDS Vest / Clip   Station Marker A    Ruler Value 32    ReDS Value Range Low volume    ReDS Actual Value 35

## 2024-03-19 NOTE — Addendum Note (Signed)
 Encounter addended by: Micael Sueanne LABOR, CMA on: 03/19/2024 2:27 PM  Actions taken: Order list changed, Diagnosis association updated, Flowsheet accepted, Clinical Note Signed

## 2024-03-19 NOTE — Patient Instructions (Addendum)
 Good to see you today!  INCREASE lasix  to 60 mg( 1 1/2 tablets) daily  Labs done today, your results will be available in MyChart, we will contact you for abnormal readings.  Repeat lab work in 2 weeks as scheduled  Your physician recommends that you schedule a follow-up appointment  3 months (November) Call office in September to schedule an appointment  If you have any questions or concerns before your next appointment please send us  a message through [facility] or call our office at [phone].    TO LEAVE A MESSAGE FOR THE NURSE SELECT OPTION 2, PLEASE LEAVE A MESSAGE INCLUDING: YOUR NAME DATE OF BIRTH CALL BACK NUMBER REASON FOR CALL**this is important as we prioritize the call backs  YOU WILL RECEIVE A CALL BACK THE SAME DAY AS LONG AS YOU CALL BEFORE 4:00 PM At the [facility], you and your health needs are our priority. As part of our continuing mission to provide you with exceptional heart care, we have created designated Provider Care Teams. These Care Teams include your primary Cardiologist (physician) and Advanced Practice Providers (APPs- Physician Assistants and Nurse Practitioners) who all work together to provide you with the care you need, when you need it.   You may see any of the following providers on your designated Care Team at your next follow up: Dr Toribio Fuel Dr Ezra Shuck Dr. Ria Commander Dr. Morene Brownie Amy Lenetta, NP Caffie Shed, [place] [facility], [place] Beckey Coe, NP [place] Lee, NP [name], NP Tinnie Redman, PharmD Jaun Bash, PharmD   Please be sure to bring in all your medications bottles to every appointment.    Thank you for choosing [facility]

## 2024-03-20 ENCOUNTER — Encounter: Payer: Self-pay | Admitting: Internal Medicine

## 2024-03-20 DIAGNOSIS — I5022 Chronic systolic (congestive) heart failure: Secondary | ICD-10-CM

## 2024-03-20 MED ORDER — POTASSIUM CHLORIDE CRYS ER 20 MEQ PO TBCR: 40.0000 meq | EXTENDED_RELEASE_TABLET | Freq: Every day | ORAL | 3 refills | Status: DC

## 2024-03-20 NOTE — Telephone Encounter (Signed)
 Patient advised and verbalized understanding.med list updated to reflect changes.   Meds ordered this encounter  Medications   potassium chloride  SA (KLOR-CON  M) 20 MEQ tablet    Sig: Take 2 tablets (40 mEq total) by mouth daily.    Dispense:  180 tablet    Refill:  3    Please cancel all previous orders for current medication. Change in dosage or pill size.

## 2024-03-20 NOTE — Telephone Encounter (Signed)
-----   Message from Harlene CHRISTELLA Gainer sent at 03/19/2024  4:40 PM EDT ----- K is low. Please start 40 KCL daily. She has repeat labs arranged ----- Message ----- From: Rebecka, Lab In [place] Sent: 03/19/2024  12:56 PM EDT To: Harlene CHRISTELLA Gainer, FNP

## 2024-03-25 NOTE — Addendum Note (Signed)
 Addended by: VICCI SELLER A on: 03/25/2024 10:51 AM   Modules accepted: Orders

## 2024-03-25 NOTE — Progress Notes (Signed)
 Remote ICD transmission.

## 2024-03-27 DIAGNOSIS — N184 Chronic kidney disease, stage 4 (severe): Secondary | ICD-10-CM | POA: Diagnosis not present

## 2024-03-27 DIAGNOSIS — N390 Urinary tract infection, site not specified: Secondary | ICD-10-CM | POA: Diagnosis not present

## 2024-03-29 ENCOUNTER — Encounter: Payer: Self-pay | Admitting: Cardiology

## 2024-04-01 ENCOUNTER — Ambulatory Visit: Attending: Cardiology

## 2024-04-01 DIAGNOSIS — Z95810 Presence of automatic (implantable) cardiac defibrillator: Secondary | ICD-10-CM | POA: Diagnosis not present

## 2024-04-01 DIAGNOSIS — I5022 Chronic systolic (congestive) heart failure: Secondary | ICD-10-CM

## 2024-04-02 ENCOUNTER — Ambulatory Visit (HOSPITAL_COMMUNITY)
Admission: RE | Admit: 2024-04-02 | Discharge: 2024-04-02 | Disposition: A | Discharge status: Home / Self Care | Source: Ambulatory Visit | Attending: Cardiology | Admitting: Cardiology

## 2024-04-02 DIAGNOSIS — D631 Anemia in chronic kidney disease: Secondary | ICD-10-CM | POA: Diagnosis not present

## 2024-04-02 DIAGNOSIS — I5022 Chronic systolic (congestive) heart failure: Secondary | ICD-10-CM | POA: Insufficient documentation

## 2024-04-02 DIAGNOSIS — I129 Hypertensive chronic kidney disease with stage 1 through stage 4 chronic kidney disease, or unspecified chronic kidney disease: Secondary | ICD-10-CM | POA: Diagnosis not present

## 2024-04-02 DIAGNOSIS — N2581 Secondary hyperparathyroidism of renal origin: Secondary | ICD-10-CM | POA: Diagnosis not present

## 2024-04-02 DIAGNOSIS — N184 Chronic kidney disease, stage 4 (severe): Secondary | ICD-10-CM | POA: Diagnosis not present

## 2024-04-02 LAB — BASIC METABOLIC PANEL WITH GFR
Anion gap: 11 (ref 5–15)
BUN: 37 mg/dL — ABNORMAL HIGH (ref 8–23)
CO2: 24 mmol/L (ref 22–32)
Calcium: 10 mg/dL (ref 8.9–10.3)
Chloride: 101 mmol/L (ref 98–111)
Creatinine, Ser: 3.18 mg/dL — ABNORMAL HIGH (ref 0.44–1.00)
GFR, Estimated: 15 mL/min — ABNORMAL LOW (ref >60)
Glucose, Bld: 94 mg/dL (ref 70–99)
Potassium: 5.3 mmol/L — ABNORMAL HIGH (ref 3.5–5.1)
Sodium: 136 mmol/L (ref 135–145)

## 2024-04-03 MED ORDER — POTASSIUM CHLORIDE CRYS ER 20 MEQ PO TBCR: 20.0000 meq | EXTENDED_RELEASE_TABLET | Freq: Every day | ORAL | Status: AC

## 2024-04-03 NOTE — Addendum Note (Signed)
 Addended by: TITA AGE B on: 04/03/2024 09:04 AM   Modules accepted: Orders

## 2024-04-04 ENCOUNTER — Other Ambulatory Visit (HOSPITAL_COMMUNITY): Payer: Self-pay | Admitting: Internal Medicine

## 2024-04-04 ENCOUNTER — Other Ambulatory Visit (HOSPITAL_BASED_OUTPATIENT_CLINIC_OR_DEPARTMENT_OTHER): Payer: Self-pay | Admitting: Internal Medicine

## 2024-04-04 DIAGNOSIS — I480 Paroxysmal atrial fibrillation: Secondary | ICD-10-CM | POA: Diagnosis not present

## 2024-04-04 DIAGNOSIS — I442 Atrioventricular block, complete: Secondary | ICD-10-CM | POA: Diagnosis not present

## 2024-04-04 DIAGNOSIS — Z0000 Encounter for general adult medical examination without abnormal findings: Secondary | ICD-10-CM | POA: Diagnosis not present

## 2024-04-04 DIAGNOSIS — I420 Dilated cardiomyopathy: Secondary | ICD-10-CM | POA: Diagnosis not present

## 2024-04-04 DIAGNOSIS — Z1331 Encounter for screening for depression: Secondary | ICD-10-CM | POA: Diagnosis not present

## 2024-04-04 DIAGNOSIS — E7800 Pure hypercholesterolemia, unspecified: Secondary | ICD-10-CM | POA: Diagnosis not present

## 2024-04-04 DIAGNOSIS — I472 Ventricular tachycardia, unspecified: Secondary | ICD-10-CM | POA: Diagnosis not present

## 2024-04-04 DIAGNOSIS — E559 Vitamin D deficiency, unspecified: Secondary | ICD-10-CM | POA: Diagnosis not present

## 2024-04-04 DIAGNOSIS — D696 Thrombocytopenia, unspecified: Secondary | ICD-10-CM | POA: Diagnosis not present

## 2024-04-04 DIAGNOSIS — E039 Hypothyroidism, unspecified: Secondary | ICD-10-CM | POA: Diagnosis not present

## 2024-04-04 DIAGNOSIS — M8580 Other specified disorders of bone density and structure, unspecified site: Secondary | ICD-10-CM

## 2024-04-04 DIAGNOSIS — N184 Chronic kidney disease, stage 4 (severe): Secondary | ICD-10-CM | POA: Diagnosis not present

## 2024-04-04 DIAGNOSIS — I5022 Chronic systolic (congestive) heart failure: Secondary | ICD-10-CM | POA: Diagnosis not present

## 2024-04-05 NOTE — Progress Notes (Signed)
 EPIC Encounter for ICM Monitoring  Patient Name: Kristi Parker is a 73 y.o. female Date: 04/05/2024 Primary Care Physican: Charlott Dorn LABOR, MD Primary Cardiologist: [name] [facility] HF) Electrophysiologist: [name] Bi-V Pacing:  99.9%    09/06/2022 Weight: 126 lbs 01/02/2024 Weight: 120 lbs  01/10/2024 Weight:  120.6 lbs   01/26/2024 Weight: 117 lbs   02/28/2024 Weight: 117.8 lbs (lowest weight 115 lbs after 7/2 Metolazone ) 04/05/2024 Weight: 109 lbs          Spoke with patient and heart failure questions reviewed.  Transmission results reviewed.  Pt asymptomatic for fluid accumulation.  Reports feeling well at this time and voices no complaints.     Optivol thoracic impedance suggesting normal fluid levels since 7/29.   Prescribed:  Furosemide  40 mg take 1.5 tablet(s) (60 mg total) by mouth daily.  Metolazone  2.5 mg take 1 tablet as needed. As directed by HF clinic.  Per Dr Nelle 01/17/2024 OV note, she can use metolazone  as needed for weight > 120 pounds and 1 potassium.  Potassium 20 mEq take 1 tablet daily   Labs: 04/10/2024 BMET scheduled at HF clinic 04/02/2024 Creatinine 3.18, BUN 37, Potassium 5.3, Sodium 136, GFR 15 03/05/2024 Creatinine 2.38, BUN 31, Potassium 3.7, Sodium 144, GFR 21, BNP 712.7 11/21/2023 Creatinine 3.43, BUN 48, Potassium 4.7, Sodium 140, GFR 14 11/14/2023 Creatinine 3.80, BUN 69, Potassium 4.8, Sodium 139, GFR 12  11/08/2023 Creatinine 3.89, BUN 58, Potassium 3.4, Sodium 139, GFR 12  11/02/2023 Creatinine 2.85, BUN 38, Potassium 3.7, Sodium 141, GFR 17, BNP 368.5 A complete set of results can be found in Results Review.   Recommendations:  No changes and encouraged to call if experiencing any fluid symptoms.   Follow-up plan: ICM clinic phone appointment on 05/06/2024.  91 day device clinic remote transmission 04/24/2024.    EP/Cardiology Office Visits:    04/08/2024 with [name], NP.   Recall 06/17/2024 with [name].      Copy  of ICM check sent to Dr Inocencio.    3 month ICM trend: 04/01/2024.    12-14 Month ICM trend:     Kristi GORMAN Garner, RN 04/05/2024 1:41 PM

## 2024-04-07 NOTE — Progress Notes (Unsigned)
 Electrophysiology Office Note:   Date:  04/08/2024  ID:  MEI SUITS, DOB [birth year], MRN [id number]  Primary Cardiologist: Oneil Parchment, MD Primary Heart Failure: None Electrophysiologist: Will Gladis Norton, MD       History of Present Illness:   Kristi Parker is a 73 y.o. female with h/o AF / AFL, CHB, HFrEF due to NICM s/p CRT-D, secondary PH, VT, multiple sclerosis seen today for acute visit due to VT treated with ATP.    CV Remote Alert on 03/04/24 at 0717 for VT lasting 26 seconds, ATP x2 with clean  break.  She reported increases LE swelling & Optivol showed fluid accumulation. She was on vacation at the time.  She was seen in the AHF Clinic on 03/19/24 after the VT episode above. Metolazone  was increased transiently and volume status was improved.   Patient reports she is not aware of any further events on her device. States she has good days and bad days in regards to energy.  She wishes she had more energy to do the things she wants to do.  She denies chest pain, palpitations, dyspnea, PND, orthopnea, nausea, vomiting, dizziness, syncope, edema, weight gain, or early satiety.   Review of systems complete and found to be negative unless listed in HPI.   EP Information / Studies Reviewed:    EKG is not ordered today. EKG from 03/19/24 reviewed which showed AV dual paced at 63 bpm       ICD Interrogation-  reviewed in detail today,  See PACEART report.  Device History: Medtronic BiV ICD implanted 07/28/16 for HFrEF, CHB  Initially PPM 1/20023, then CRT-P 06/2008, upgrade to ICD 07/28/16 Generator Change 09/18/14, 01/24/2023, 08/2023, 10/2023, 02/2024 DEPENDENT History of appropriate therapy: Yes History of AAD therapy: Yes; currently on amiodarone , mexiletine  Risk Assessment/Calculations:    CHA2DS2-VASc Score = 5   This indicates a 7.2% annual risk of stroke. The patient's score is based upon: CHF History: 1 HTN History: 0 Diabetes History: 0 Stroke History:  2 Vascular Disease History: 0 Age Score: 1 Gender Score: 1             Physical Exam:   VS:  BP 102/70   Pulse 83   Ht 5' 3 (1.6 m)   Wt 111 lb (50.3 kg)   LMP  (LMP Unknown)   SpO2 99%   BMI 19.66 kg/m    Wt Readings from Last 3 Encounters:  04/08/24 111 lb (50.3 kg)  03/19/24 109 lb 9.6 oz (49.7 kg)  01/17/24 118 lb 9.6 oz (53.8 kg)     GEN: Well nourished, well developed in no acute distress NECK: No JVD; No carotid bruits CARDIAC: Regular rate and rhythm, no murmurs, rubs, gallops RESPIRATORY:  Clear to auscultation without rales, wheezing or rhonchi  ABDOMEN: Soft, non-tender, non-distended EXTREMITIES:  No edema; No deformity   ASSESSMENT AND PLAN:    CHB, Chronic Systolic Dysfunction s/p Medtronic CRT-D  VT / NSVT  High Risk Medication Monitoring: Amiodarone   -euvolemic on exam / by device   -99.8% BIV pacing  -Stable on an appropriate medical regimen -patient had brief episode of syncope with LV threshold testing.  Recovered within seconds and no further symptoms.  Explained threshold testing to patient and husband in detail.  Follow up call to patient after clinic and she reported feeling well.  -Normal ICD function -See Pace Art report -No changes today -amiodarone  200 mg daily  -Mexiletine 150 mg BID  -recent labs reviewed from  04/02/24 > she is pending follow up labs with Nephrology this week -no driving for 6 months from 03/04/24 > reviewed with patient and husband -amio labs UTD > 11/2023 -previously evaluated at [facility] for heart-kidney transplant, not a candidate, not interested in VAD  Paroxysmal Atrial Fibrillation  Atrial Flutter  CHA2DS2-VASc 5 -OAC for stroke prophylaxis  -amiodarone  as above   Secondary Hypercoagulable State  -continue Eliquis  2.5mg  BID, dose reviewed and appropriate by wt / cr  CKD IV -previously evaluated at [facility], not a transplant candidate   Disposition:   Follow up with Dr. Inocencio in 6 months   Signed, [name], NP-C, AGACNP-BC [facility] - Electrophysiology  04/08/2024, 4:12 PM

## 2024-04-08 ENCOUNTER — Ambulatory Visit: Attending: Pulmonary Disease | Admitting: Pulmonary Disease

## 2024-04-08 ENCOUNTER — Ambulatory Visit: Payer: Self-pay | Admitting: Cardiology

## 2024-04-08 ENCOUNTER — Encounter: Payer: Self-pay | Admitting: Pulmonary Disease

## 2024-04-08 VITALS — BP 102/70 | HR 83 | Ht 63.0 in | Wt 111.0 lb

## 2024-04-08 DIAGNOSIS — Z95810 Presence of automatic (implantable) cardiac defibrillator: Secondary | ICD-10-CM | POA: Diagnosis not present

## 2024-04-08 DIAGNOSIS — I5022 Chronic systolic (congestive) heart failure: Secondary | ICD-10-CM

## 2024-04-08 DIAGNOSIS — I428 Other cardiomyopathies: Secondary | ICD-10-CM

## 2024-04-08 DIAGNOSIS — D6869 Other thrombophilia: Secondary | ICD-10-CM | POA: Diagnosis not present

## 2024-04-08 DIAGNOSIS — I472 Ventricular tachycardia, unspecified: Secondary | ICD-10-CM

## 2024-04-08 LAB — CUP PACEART INCLINIC DEVICE CHECK
Date Time Interrogation Session: 20250825161455
Implantable Lead Connection Status: 753985
Implantable Lead Connection Status: 753985
Implantable Lead Connection Status: 753985
Implantable Lead Implant Date: 20091104
Implantable Lead Implant Date: 20091104
Implantable Lead Implant Date: 20171214
Implantable Lead Location: 753858
Implantable Lead Location: 753859
Implantable Lead Location: 753860
Implantable Lead Model: 4196
Implantable Lead Model: 5076
Implantable Pulse Generator Implant Date: 20240611

## 2024-04-08 NOTE — Patient Instructions (Signed)
 Medication Instructions:  Your physician recommends that you continue on your current medications as directed. Please refer to the Current Medication list given to you today.  *If you need a refill on your cardiac medications before your next appointment, please call your pharmacy*  Lab Work: None ordered If you have labs (blood work) drawn today and your tests are completely normal, you will receive your results only by: MyChart Message (if you have MyChart) OR A paper copy in the mail If you have any lab test that is abnormal or we need to change your treatment, we will call you to review the results.  Follow-Up: At [facility], you and your health needs are our priority.  As part of our continuing mission to provide you with exceptional heart care, our providers are all part of one team.  This team includes your primary Cardiologist (physician) and Advanced Practice Providers or APPs (Physician Assistants and Nurse Practitioners) who all work together to provide you with the care you need, when you need it.  Your next appointment:   6 month(s)  Provider:   Soyla Norton, MD or [name], NP

## 2024-04-10 ENCOUNTER — Ambulatory Visit (HOSPITAL_COMMUNITY)
Admission: RE | Admit: 2024-04-10 | Discharge: 2024-04-10 | Disposition: A | Discharge status: Home / Self Care | Source: Ambulatory Visit | Attending: Cardiology | Admitting: Cardiology

## 2024-04-10 DIAGNOSIS — I5022 Chronic systolic (congestive) heart failure: Secondary | ICD-10-CM | POA: Diagnosis not present

## 2024-04-10 LAB — BASIC METABOLIC PANEL WITH GFR
Anion gap: 9 (ref 5–15)
BUN: 48 mg/dL — ABNORMAL HIGH (ref 8–23)
CO2: 23 mmol/L (ref 22–32)
Calcium: 9.6 mg/dL (ref 8.9–10.3)
Chloride: 103 mmol/L (ref 98–111)
Creatinine, Ser: 3.21 mg/dL — ABNORMAL HIGH (ref 0.44–1.00)
GFR, Estimated: 15 mL/min — ABNORMAL LOW (ref >60)
Glucose, Bld: 81 mg/dL (ref 70–99)
Potassium: 4.8 mmol/L (ref 3.5–5.1)
Sodium: 135 mmol/L (ref 135–145)

## 2024-04-11 ENCOUNTER — Telehealth (HOSPITAL_COMMUNITY): Payer: Self-pay | Admitting: Cardiology

## 2024-04-11 NOTE — Telephone Encounter (Signed)
 Patient left VM on triage Reports upcoming travel plans, with traveling plans to HOLD lasix  all together. Questioned if she should also hold potassium.   Plans to HOLD lasix  x 3 days for travel  Reports she is currently stable at 210 No edema NO SOB   Please advise

## 2024-04-12 NOTE — Telephone Encounter (Signed)
 pt aware

## 2024-04-17 DIAGNOSIS — H401123 Primary open-angle glaucoma, left eye, severe stage: Secondary | ICD-10-CM | POA: Diagnosis not present

## 2024-04-17 DIAGNOSIS — H401112 Primary open-angle glaucoma, right eye, moderate stage: Secondary | ICD-10-CM | POA: Diagnosis not present

## 2024-04-24 ENCOUNTER — Ambulatory Visit: Payer: Medicare PPO

## 2024-04-24 DIAGNOSIS — I428 Other cardiomyopathies: Secondary | ICD-10-CM

## 2024-04-26 LAB — CUP PACEART REMOTE DEVICE CHECK
Battery Remaining Longevity: 69 mo
Battery Voltage: 2.97 V
Brady Statistic AP VP Percent: 99.97 %
Brady Statistic AP VS Percent: 0.02 %
Brady Statistic AS VP Percent: 0 %
Brady Statistic AS VS Percent: 0.01 %
Brady Statistic RA Percent Paced: 100 %
Brady Statistic RV Percent Paced: 99.97 %
Date Time Interrogation Session: 20250911131946
HighPow Impedance: 39 Ohm
Implantable Lead Connection Status: 753985
Implantable Lead Connection Status: 753985
Implantable Lead Connection Status: 753985
Implantable Lead Implant Date: 20091104
Implantable Lead Implant Date: 20091104
Implantable Lead Implant Date: 20171214
Implantable Lead Location: 753858
Implantable Lead Location: 753859
Implantable Lead Location: 753860
Implantable Lead Model: 4196
Implantable Lead Model: 5076
Implantable Pulse Generator Implant Date: 20240611
Lead Channel Impedance Value: 1026 Ohm
Lead Channel Impedance Value: 323 Ohm
Lead Channel Impedance Value: 342 Ohm
Lead Channel Impedance Value: 380 Ohm
Lead Channel Impedance Value: 437 Ohm
Lead Channel Impedance Value: 646 Ohm
Lead Channel Pacing Threshold Amplitude: 0.5 V
Lead Channel Pacing Threshold Amplitude: 1.875 V
Lead Channel Pacing Threshold Pulse Width: 0.4 ms
Lead Channel Pacing Threshold Pulse Width: 1 ms
Lead Channel Sensing Intrinsic Amplitude: 4.9 mV
Lead Channel Setting Pacing Amplitude: 2 V
Lead Channel Setting Pacing Amplitude: 2 V
Lead Channel Setting Pacing Amplitude: 2.5 V
Lead Channel Setting Pacing Pulse Width: 0.4 ms
Lead Channel Setting Pacing Pulse Width: 1 ms
Lead Channel Setting Sensing Sensitivity: 0.3 mV
Zone Setting Status: 755011
Zone Setting Status: 755011

## 2024-04-30 ENCOUNTER — Ambulatory Visit: Payer: Self-pay | Admitting: Cardiology

## 2024-05-03 NOTE — Progress Notes (Signed)
 ICM Remote Transmission rescheduled for 05/13/2024.

## 2024-05-03 NOTE — Progress Notes (Signed)
Remote ICD Transmission.

## 2024-05-06 ENCOUNTER — Encounter

## 2024-05-13 ENCOUNTER — Ambulatory Visit: Attending: Cardiology

## 2024-05-13 DIAGNOSIS — I5022 Chronic systolic (congestive) heart failure: Secondary | ICD-10-CM | POA: Diagnosis not present

## 2024-05-13 DIAGNOSIS — Z95810 Presence of automatic (implantable) cardiac defibrillator: Secondary | ICD-10-CM

## 2024-05-16 NOTE — Progress Notes (Signed)
 EPIC Encounter for ICM Monitoring  Patient Name: Kristi Parker is a 73 y.o. female Date: 05/16/2024 Primary Care Physican: Charlott Dorn LABOR, MD Primary Cardiologist: [name] [facility] HF) Electrophysiologist: [name] Bi-V Pacing:  99.9%    09/06/2022 Weight: 126 lbs 01/02/2024 Weight: 120 lbs  01/10/2024 Weight:  120.6 lbs   01/26/2024 Weight: 117 lbs   02/28/2024 Weight: 117.8 lbs (lowest weight 115 lbs after 7/2 Metolazone ) 04/05/2024 Weight: 109 lbs          Spoke with patient and heart failure questions reviewed.  Transmission results reviewed.  Pt reports son passed away 05-25-24 and currently at the viewing.     Since 04/01/2024 ICM Remote Transmission:  Optivol thoracic impedance suggesting normal fluid levels.   Prescribed:  Furosemide  40 mg take 1.5 tablet(s) (60 mg total) by mouth daily.  Metolazone  2.5 mg take 1 tablet as needed. As directed by HF clinic.  Per Dr Nelle 01/17/2024 OV note, she can use metolazone  as needed for weight > 120 pounds and 1 potassium.  Potassium 20 mEq take 1 tablet daily   Labs: 04/10/2024 Creatinine 3.21, BUN 48, Potassium 4.8, Sodium 135, GFR 15 04/02/2024 Creatinine 3.18, BUN 37, Potassium 5.3, Sodium 136, GFR 15 03/05/2024 Creatinine 2.38, BUN 31, Potassium 3.7, Sodium 144, GFR 21, BNP 712.7 11/21/2023 Creatinine 3.43, BUN 48, Potassium 4.7, Sodium 140, GFR 14 11/14/2023 Creatinine 3.80, BUN 69, Potassium 4.8, Sodium 139, GFR 12  11/08/2023 Creatinine 3.89, BUN 58, Potassium 3.4, Sodium 139, GFR 12  11/02/2023 Creatinine 2.85, BUN 38, Potassium 3.7, Sodium 141, GFR 17, BNP 368.5 A complete set of results can be found in Results Review.   Recommendations:  No changes and encouraged to call if experiencing any fluid symptoms.   Follow-up plan: ICM clinic phone appointment on 06/17/2024.  91 day device clinic remote transmission 07/24/2024.    EP/Cardiology Office Visits:   Recall 10/05/2024 with [name], NP.   Recall 06/17/2024  with [name].      Copy of ICM check sent to Dr Inocencio.     Remote monitoring is medically necessary for Heart Failure Management.    90 day Daily Thoracic Impedance ICM trend: 02/11/2024 through 05/12/2024.    12-14 Month Thoracic Impedance ICM trend:     Kristi GORMAN Garner, RN 05/16/2024 9:29 AM

## 2024-06-12 DIAGNOSIS — Z1231 Encounter for screening mammogram for malignant neoplasm of breast: Secondary | ICD-10-CM | POA: Diagnosis not present

## 2024-06-17 ENCOUNTER — Ambulatory Visit: Attending: Cardiology

## 2024-06-17 DIAGNOSIS — Z95810 Presence of automatic (implantable) cardiac defibrillator: Secondary | ICD-10-CM | POA: Diagnosis not present

## 2024-06-17 DIAGNOSIS — I5022 Chronic systolic (congestive) heart failure: Secondary | ICD-10-CM

## 2024-06-18 ENCOUNTER — Telehealth: Payer: Self-pay

## 2024-06-18 NOTE — Progress Notes (Signed)
 EPIC Encounter for ICM Monitoring  Patient Name: Kristi Parker is a 73 y.o. female Date: 06/18/2024 Primary Care Physican: Charlott Dorn LABOR, MD Primary Cardiologist: [name] [facility] HF) Electrophysiologist: [name] Bi-V Pacing:  99.9%    09/06/2022 Weight: 126 lbs 01/02/2024 Weight: 120 lbs  01/10/2024 Weight:  120.6 lbs   01/26/2024 Weight: 117 lbs   02/28/2024 Weight: 117.8 lbs (lowest weight 115 lbs after 7/2 Metolazone ) 04/05/2024 Weight: 109 lbs          Attempted call to patient and unable to reach.   Transmission results reviewed.     Since 05/13/2024 ICM Remote Transmission:  Optivol thoracic impedance suggesting possible fluid accumulation starting 05/31/2024.   Prescribed:  Furosemide  40 mg take 1.5 tablet(s) (60 mg total) by mouth daily.  Metolazone  2.5 mg take 1 tablet as needed. As directed by HF clinic.  Per Dr Nelle 01/17/2024 OV note, she can use metolazone  as needed for weight > 120 pounds and 1 potassium.  Potassium 20 mEq take 1 tablet daily   Labs: 04/10/2024 Creatinine 3.21, BUN 48, Potassium 4.8, Sodium 135, GFR 15 04/02/2024 Creatinine 3.18, BUN 37, Potassium 5.3, Sodium 136, GFR 15 03/05/2024 Creatinine 2.38, BUN 31, Potassium 3.7, Sodium 144, GFR 21, BNP 712.7 11/21/2023 Creatinine 3.43, BUN 48, Potassium 4.7, Sodium 140, GFR 14 11/14/2023 Creatinine 3.80, BUN 69, Potassium 4.8, Sodium 139, GFR 12  11/08/2023 Creatinine 3.89, BUN 58, Potassium 3.4, Sodium 139, GFR 12  11/02/2023 Creatinine 2.85, BUN 38, Potassium 3.7, Sodium 141, GFR 17, BNP 368.5 A complete set of results can be found in Results Review.   Recommendations:  Unable to reach.  Will send to HF clinic if patient is reached.    Follow-up plan: ICM clinic phone appointment on 06/24/2024 to recheck fluid levels.  91 day device clinic remote transmission 07/24/2024.    EP/Cardiology Office Visits:   Recall 10/05/2024 with [name], NP (6 month).   Recall 06/17/2024 with Dr  Cherrie (3 month).      Copy of ICM check sent to Dr Inocencio.    Remote monitoring is medically necessary for Heart Failure Management.    Daily Thoracic Impedance ICM trend: 03/18/2024 through 06/17/2024.    12-14 Month Thoracic Impedance ICM trend:     [name], RN 06/18/2024 9:39 AM

## 2024-06-18 NOTE — Telephone Encounter (Signed)
 Remote ICM transmission received.  Attempted call to patient regarding ICM remote transmission.  Left detailed message per DPR with ICM phone number to return call for any questions, concerns or fluid symptoms.

## 2024-06-24 ENCOUNTER — Ambulatory Visit: Attending: Cardiology

## 2024-06-24 DIAGNOSIS — I5022 Chronic systolic (congestive) heart failure: Secondary | ICD-10-CM

## 2024-06-24 DIAGNOSIS — Z95810 Presence of automatic (implantable) cardiac defibrillator: Secondary | ICD-10-CM

## 2024-06-25 DIAGNOSIS — N184 Chronic kidney disease, stage 4 (severe): Secondary | ICD-10-CM | POA: Diagnosis not present

## 2024-06-26 NOTE — Progress Notes (Signed)
 EPIC Encounter for ICM Monitoring  Patient Name: Kristi Parker is a 73 y.o. female Date: 06/26/2024 Primary Care Physican: Charlott Dorn LABOR, MD Primary Cardiologist: [name] [facility] HF) Electrophysiologist: [name] Bi-V Pacing:  99.9%    09/06/2022 Weight: 126 lbs 01/02/2024 Weight: 120 lbs  01/10/2024 Weight:  120.6 lbs   01/26/2024 Weight: 117 lbs   02/28/2024 Weight: 117.8 lbs (lowest weight 115 lbs after 7/2 Metolazone ) 04/05/2024 Weight: 109 lbs          Transmission results reviewed.     Since 06/17/2024 ICM Remote Transmission:  Optivol thoracic impedance suggesting fluid levels returned to normal.   Prescribed:  Furosemide  40 mg take 1.5 tablet(s) (60 mg total) by mouth daily.  Metolazone  2.5 mg take 1 tablet as needed. As directed by HF clinic.  Per Dr Nelle 01/17/2024 OV note, she can use metolazone  as needed for weight > 120 pounds and 1 potassium.  Potassium 20 mEq take 1 tablet daily   Labs: 04/10/2024 Creatinine 3.21, BUN 48, Potassium 4.8, Sodium 135, GFR 15 04/02/2024 Creatinine 3.18, BUN 37, Potassium 5.3, Sodium 136, GFR 15 03/05/2024 Creatinine 2.38, BUN 31, Potassium 3.7, Sodium 144, GFR 21, BNP 712.7 11/21/2023 Creatinine 3.43, BUN 48, Potassium 4.7, Sodium 140, GFR 14 11/14/2023 Creatinine 3.80, BUN 69, Potassium 4.8, Sodium 139, GFR 12  11/08/2023 Creatinine 3.89, BUN 58, Potassium 3.4, Sodium 139, GFR 12  11/02/2023 Creatinine 2.85, BUN 38, Potassium 3.7, Sodium 141, GFR 17, BNP 368.5 A complete set of results can be found in Results Review.   Recommendations:  No changes.    Follow-up plan: ICM clinic phone appointment on 07/29/2024.  91 day device clinic remote transmission 07/24/2024.    EP/Cardiology Office Visits:   Recall 10/05/2024 with [name], NP (6 month).   Recall 06/17/2024 with [name] (3 month).      Copy of ICM check sent to Dr Inocencio.    Remote monitoring is medically necessary for Heart Failure Management.     Daily Thoracic Impedance ICM trend: 03/24/2024 through 06/23/2024.    12-14 Month Thoracic Impedance ICM trend:     Kristi GORMAN Garner, RN 06/26/2024 5:18 PM

## 2024-07-02 DIAGNOSIS — I5022 Chronic systolic (congestive) heart failure: Secondary | ICD-10-CM | POA: Diagnosis not present

## 2024-07-02 DIAGNOSIS — N184 Chronic kidney disease, stage 4 (severe): Secondary | ICD-10-CM | POA: Diagnosis not present

## 2024-07-02 DIAGNOSIS — D631 Anemia in chronic kidney disease: Secondary | ICD-10-CM | POA: Diagnosis not present

## 2024-07-02 DIAGNOSIS — N2581 Secondary hyperparathyroidism of renal origin: Secondary | ICD-10-CM | POA: Diagnosis not present

## 2024-07-02 DIAGNOSIS — I130 Hypertensive heart and chronic kidney disease with heart failure and stage 1 through stage 4 chronic kidney disease, or unspecified chronic kidney disease: Secondary | ICD-10-CM | POA: Diagnosis not present

## 2024-07-12 ENCOUNTER — Other Ambulatory Visit: Payer: Self-pay | Admitting: Internal Medicine

## 2024-07-15 ENCOUNTER — Telehealth (HOSPITAL_COMMUNITY): Payer: Self-pay | Admitting: Cardiology

## 2024-07-15 NOTE — Telephone Encounter (Signed)
 Patient left VM reporting cough with phlegm, sneezing and congestion.   Reports similar symptoms when fluids levels were high, fluid in chest  Reports allegra otc did not help  Weight today 110, normal weight 107  Lasix  60 mg daily   Please advise

## 2024-07-15 NOTE — Telephone Encounter (Signed)
 Pt aware and voiced understanding

## 2024-07-24 ENCOUNTER — Ambulatory Visit: Payer: Self-pay | Admitting: Cardiology

## 2024-07-24 ENCOUNTER — Ambulatory Visit: Payer: Medicare PPO

## 2024-07-24 LAB — CUP PACEART REMOTE DEVICE CHECK
Battery Remaining Longevity: 61 mo
Battery Voltage: 2.96 V
Brady Statistic RV Percent Paced: 99.69 %
Date Time Interrogation Session: 20251209193201
HighPow Impedance: 34 Ohm
Implantable Lead Connection Status: 753985
Implantable Lead Connection Status: 753985
Implantable Lead Connection Status: 753985
Implantable Lead Implant Date: 20091104
Implantable Lead Implant Date: 20091104
Implantable Lead Implant Date: 20171214
Implantable Lead Location: 753858
Implantable Lead Location: 753859
Implantable Lead Location: 753860
Implantable Lead Model: 4196
Implantable Lead Model: 5076
Implantable Pulse Generator Implant Date: 20240611
Lead Channel Impedance Value: 304 Ohm
Lead Channel Impedance Value: 323 Ohm
Lead Channel Impedance Value: 361 Ohm
Lead Channel Impedance Value: 399 Ohm
Lead Channel Impedance Value: 532 Ohm
Lead Channel Impedance Value: 893 Ohm
Lead Channel Pacing Threshold Amplitude: 0.625 V
Lead Channel Pacing Threshold Amplitude: 1.625 V
Lead Channel Pacing Threshold Pulse Width: 0.4 ms
Lead Channel Pacing Threshold Pulse Width: 1 ms
Lead Channel Sensing Intrinsic Amplitude: 3.3 mV
Lead Channel Setting Pacing Amplitude: 2 V
Lead Channel Setting Pacing Amplitude: 2 V
Lead Channel Setting Pacing Amplitude: 2.5 V
Lead Channel Setting Pacing Pulse Width: 0.4 ms
Lead Channel Setting Pacing Pulse Width: 1 ms
Lead Channel Setting Sensing Sensitivity: 0.3 mV
Zone Setting Status: 755011
Zone Setting Status: 755011

## 2024-07-29 ENCOUNTER — Ambulatory Visit

## 2024-07-29 DIAGNOSIS — Z95810 Presence of automatic (implantable) cardiac defibrillator: Secondary | ICD-10-CM

## 2024-07-29 DIAGNOSIS — I5022 Chronic systolic (congestive) heart failure: Secondary | ICD-10-CM | POA: Diagnosis not present

## 2024-07-31 NOTE — Progress Notes (Signed)
 Remote ICD Transmission

## 2024-08-02 NOTE — Progress Notes (Cosign Needed)
 EPIC Encounter for ICM Monitoring  Patient Name: Kristi Parker is a 73 y.o. female Date: 08/02/2024 Primary Care Physican: Charlott Dorn LABOR, MD Primary Cardiologist: [name] [facility] HF) Electrophysiologist: [name] Bi-V Pacing:  99.8%    09/06/2022 Weight: 126 lbs 01/02/2024 Weight: 120 lbs  01/10/2024 Weight:  120.6 lbs   01/26/2024 Weight: 117 lbs   02/28/2024 Weight: 117.8 lbs (lowest weight 115 lbs after 7/2 Metolazone ) 04/05/2024 Weight: 109 lbs          Transmission results reviewed.     Since 06/24/2024 ICM Remote Transmission:  Optivol thoracic impedance suggesting intermittent days with possible fluid accumulation.   Prescribed:  Furosemide  40 mg take 1.5 tablet(s) (60 mg total) by mouth daily.  Metolazone  2.5 mg take 1 tablet as needed. As directed by HF clinic.  Per Dr Nelle 01/17/2024 OV note, she can use metolazone  as needed for weight > 120 pounds and 1 potassium.  Potassium 20 mEq take 1 tablet daily   Labs: 04/10/2024 Creatinine 3.21, BUN 48, Potassium 4.8, Sodium 135, GFR 15 04/02/2024 Creatinine 3.18, BUN 37, Potassium 5.3, Sodium 136, GFR 15 03/05/2024 Creatinine 2.38, BUN 31, Potassium 3.7, Sodium 144, GFR 21, BNP 712.7 11/21/2023 Creatinine 3.43, BUN 48, Potassium 4.7, Sodium 140, GFR 14 11/14/2023 Creatinine 3.80, BUN 69, Potassium 4.8, Sodium 139, GFR 12  11/08/2023 Creatinine 3.89, BUN 58, Potassium 3.4, Sodium 139, GFR 12  11/02/2023 Creatinine 2.85, BUN 38, Potassium 3.7, Sodium 141, GFR 17, BNP 368.5 A complete set of results can be found in Results Review.   Recommendations:  No changes.    Follow-up plan: ICM clinic phone appointment on 09/09/2024.  91 day device clinic remote transmission 10/23/2024.    EP/Cardiology Office Visits:   Recall 10/05/2024 with [name], NP (6 month).   Recall 06/17/2024 with [name] (3 month).      Copy of ICM check sent to Dr Inocencio.      Remote monitoring is medically necessary for Heart  Failure Management.    Daily Thoracic Impedance ICM trend: 04/29/2024 through 07/29/2024.    12-14 Month Thoracic Impedance ICM trend:     [name], RN 08/02/2024 2:15 PM

## 2024-08-20 ENCOUNTER — Telehealth (HOSPITAL_COMMUNITY): Payer: Self-pay | Admitting: Cardiology

## 2024-08-20 NOTE — Telephone Encounter (Signed)
" °  ADVANCED HEART FAILURE CLINIC   Pre-operative Risk Assessment   HEARTCARE STAFF-IMPORTANT INSTRUCTIONS 1 Red and Blue Text will auto delete once note is signed or closed. 2 Press F2 to navigate through template.   3 On drop down lists, L click to select >> R click to activate next field 4 Reason for Visit format is IMPORTANT!!  See Directions on No. 2 below. 5 Please review chart to determine if there is already a clearance note open for this procedure!!  DO NOT duplicate if a note already exists!!    :1}     Request for Surgical Clearance    Procedure:  Dental Extraction - Amount of Teeth to be Pulled:  1  Date of Surgery:  Clearance TBD                                 Surgeon:   Surgeon's Group or Practice Name:  [facility]  Phone number:  [phone] Fax number:  [phone]   Type of Clearance Requested:   - Pharmacy:  Hold Apixaban  (Eliquis ) how long should patient hold, if hold is needed?   Type of Anesthesia:  Local    Additional requests/questions:  Does this patient need antibiotics? Please advise surgeon/provider what medications should be held.  Bonney Jerona Dalton CHRISTELLA   08/20/2024, 4:25 PM   [facility] Toribio Fuel (enter provider) [facility] [address] [facility] [place] [address] [phone] (office) [phone] (fax)   "

## 2024-08-21 NOTE — Telephone Encounter (Signed)
 Routed via epic and faxed to number provided

## 2024-09-09 ENCOUNTER — Ambulatory Visit: Attending: Cardiology

## 2024-09-09 DIAGNOSIS — Z95810 Presence of automatic (implantable) cardiac defibrillator: Secondary | ICD-10-CM

## 2024-09-09 DIAGNOSIS — I5022 Chronic systolic (congestive) heart failure: Secondary | ICD-10-CM

## 2024-09-11 NOTE — Progress Notes (Signed)
" °  Received: Today Donette Ellouise LABOR, FNP  Dmarius Reeder, Mitzie RAMAN, RN She could take the metolazone  and it looks like she's overdue for an appointment with Dr. Cherrie in the [facility] office.        "

## 2024-09-11 NOTE — Progress Notes (Signed)
 EPIC Encounter for ICM Monitoring  Patient Name: Kristi Parker is a 74 y.o. female Date: 09/11/2024 Primary Care Physican: Kristi Parker LABOR, MD Primary Cardiologist: Kristi Parker [place] HF) Electrophysiologist: Kristi Parker Bi-V Pacing:  99.8%    09/06/2022 Weight: 126 lbs 01/02/2024 Weight: 120 lbs  01/10/2024 Weight:  120.6 lbs   01/26/2024 Weight: 117 lbs   02/28/2024 Weight: 117.8 lbs (lowest weight 115 lbs after 7/2 Metolazone ) 04/05/2024 Weight: 109 lbs  09/11/2024 Weight: 113 lbs (baseline 109 lbs)              Spoke with patient and heart failure questions reviewed.  Transmission results reviewed.  Pt reports bilateral swelling of the feet and 4 lb weight gain.  She had dental procedure today and is difficult to talk with gauze in her mouth.     Since 07/29/2024 ICM Remote Transmission:  Optivol thoracic impedance suggesting possible fluid accumulation that trending slightly below baseline.  Fluid index is greater than normal starting 06/19/2024.   Prescribed:  Furosemide  40 mg take 1.5 tablet(s) (60 mg total) by mouth daily.  Metolazone  2.5 mg take 1 tablet as needed. As directed by HF clinic.  Per Kristi Kristi Parker 01/17/2024 OV note, she can use metolazone  as needed for weight > 120 pounds and 1 potassium.  Potassium 20 mEq take 1 tablet daily   Labs: 04/10/2024 Creatinine 3.21, BUN 48, Potassium 4.8, Sodium 135, GFR 15 04/02/2024 Creatinine 3.18, BUN 37, Potassium 5.3, Sodium 136, GFR 15 03/05/2024 Creatinine 2.38, BUN 31, Potassium 3.7, Sodium 144, GFR 21, BNP 712.7 11/21/2023 Creatinine 3.43, BUN 48, Potassium 4.7, Sodium 140, GFR 14 11/14/2023 Creatinine 3.80, BUN 69, Potassium 4.8, Sodium 139, GFR 12  11/08/2023 Creatinine 3.89, BUN 58, Potassium 3.4, Sodium 139, GFR 12  11/02/2023 Creatinine 2.85, BUN 38, Potassium 3.7, Sodium 141, GFR 17, BNP 368.5 A complete set of results can be found in Results Review.   Recommendations:  Copy sent to Kristi Class, NP at [facility] HF  clinic for review and recommendations.   Advised patient she is overdue to make HF clinic appt.   Follow-up plan: ICM clinic 31 day follow up currently on hold but 91 day remote monitoring will continue.   91 day device clinic remote transmission 10/23/2024.    EP/Cardiology Office Visits:   Recall 10/05/2024 with Kristi Barrack, NP (6 month).   Recall 06/17/2024 with Kristi Parker (3 month).      Remote monitoring is medically necessary for Heart Failure Management.    Daily Thoracic Impedance ICM trend: 06/10/2024 through 09/09/2024.    12-14 Month Thoracic Impedance ICM trend:     Kristi GORMAN Garner, RN 09/11/2024 3:38 PM

## 2024-09-11 NOTE — Progress Notes (Signed)
 Spoke with patient and advised of Tina's recommendations to take 1 Metolazone  tablet for swelling.  Advised to take it 30 minutes with 1 extra Potassium 30 minutes prior to taking Furosemide  and Potassium prescribed dosages.  She verbalized understanding and will take tomorrow morning.  She is aware to call the office for changes in condition an to make office appt.

## 2024-09-12 NOTE — Progress Notes (Signed)
 Retuurned patient call as requesetd by voice mail message.  She wanted to confirm she should only take a 1 time dose of Metolaozone with 1 extra potassium today and advised that is correct.  Advised starting tomorrow she should only be taking Furosemide  60 mg and 1 Potassium tablet.  She verbalized understanding.  She stated the swelling of her feet has improved and weight return to baseline of 108 lbs.  She stated she is doing well today.

## 2024-09-24 ENCOUNTER — Other Ambulatory Visit (HOSPITAL_BASED_OUTPATIENT_CLINIC_OR_DEPARTMENT_OTHER)
